# Patient Record
Sex: Male | Born: 1975
Health system: Southern US, Community
[De-identification: ages and names within clinical notes are randomized; demographics above are authoritative.]

## PROBLEM LIST (undated history)

## (undated) DIAGNOSIS — J45909 Unspecified asthma, uncomplicated: Secondary | ICD-10-CM

## (undated) DIAGNOSIS — Z9989 Dependence on other enabling machines and devices: Secondary | ICD-10-CM

## (undated) DIAGNOSIS — J189 Pneumonia, unspecified organism: Secondary | ICD-10-CM

## (undated) DIAGNOSIS — I509 Heart failure, unspecified: Secondary | ICD-10-CM

## (undated) DIAGNOSIS — G4733 Obstructive sleep apnea (adult) (pediatric): Secondary | ICD-10-CM

## (undated) DIAGNOSIS — E119 Type 2 diabetes mellitus without complications: Secondary | ICD-10-CM

## (undated) HISTORY — DX: Pneumonia, unspecified organism: J18.9

## (undated) HISTORY — DX: Heart failure, unspecified: I50.9

## (undated) HISTORY — DX: Dependence on other enabling machines and devices: Z99.89

## (undated) HISTORY — PX: I & D EXTREMITY: SHX5045

## (undated) HISTORY — DX: Obstructive sleep apnea (adult) (pediatric): G47.33

## (undated) HISTORY — DX: Type 2 diabetes mellitus without complications: E11.9

## (undated) HISTORY — PX: SKIN GRAFT: SHX250

---

## 2016-03-06 ENCOUNTER — Emergency Department (HOSPITAL_COMMUNITY)
Admission: EM | Admit: 2016-03-06 | Discharge: 2016-03-06 | Disposition: A | Discharge status: Home / Self Care | Payer: Self-pay | Attending: Emergency Medicine | Admitting: Emergency Medicine

## 2016-03-06 ENCOUNTER — Encounter (HOSPITAL_COMMUNITY): Payer: Self-pay

## 2016-03-06 ENCOUNTER — Emergency Department (HOSPITAL_COMMUNITY): Payer: Self-pay

## 2016-03-06 DIAGNOSIS — F17210 Nicotine dependence, cigarettes, uncomplicated: Secondary | ICD-10-CM | POA: Insufficient documentation

## 2016-03-06 DIAGNOSIS — Y92410 Unspecified street and highway as the place of occurrence of the external cause: Secondary | ICD-10-CM | POA: Insufficient documentation

## 2016-03-06 DIAGNOSIS — Y939 Activity, unspecified: Secondary | ICD-10-CM | POA: Insufficient documentation

## 2016-03-06 DIAGNOSIS — I493 Ventricular premature depolarization: Secondary | ICD-10-CM | POA: Insufficient documentation

## 2016-03-06 DIAGNOSIS — I712 Thoracic aortic aneurysm, without rupture: Secondary | ICD-10-CM | POA: Insufficient documentation

## 2016-03-06 DIAGNOSIS — J45909 Unspecified asthma, uncomplicated: Secondary | ICD-10-CM | POA: Insufficient documentation

## 2016-03-06 DIAGNOSIS — M25512 Pain in left shoulder: Secondary | ICD-10-CM | POA: Insufficient documentation

## 2016-03-06 DIAGNOSIS — Y999 Unspecified external cause status: Secondary | ICD-10-CM | POA: Insufficient documentation

## 2016-03-06 HISTORY — DX: Unspecified asthma, uncomplicated: J45.909

## 2016-03-06 LAB — I-STAT CHEM 8, ED
BUN: 12 mg/dL (ref 6–20)
CALCIUM ION: 1.13 mmol/L — AB (ref 1.15–1.40)
CREATININE: 0.5 mg/dL — AB (ref 0.61–1.24)
Chloride: 97 mmol/L — ABNORMAL LOW (ref 101–111)
GLUCOSE: 370 mg/dL — AB (ref 65–99)
HCT: 54 % — ABNORMAL HIGH (ref 39.0–52.0)
HEMOGLOBIN: 18.4 g/dL — AB (ref 13.0–17.0)
POTASSIUM: 4.3 mmol/L (ref 3.5–5.1)
Sodium: 137 mmol/L (ref 135–145)
TCO2: 31 mmol/L (ref 0–100)

## 2016-03-06 LAB — COMPREHENSIVE METABOLIC PANEL
ALT: 18 U/L (ref 17–63)
ANION GAP: 8 (ref 5–15)
AST: 20 U/L (ref 15–41)
Albumin: 3.7 g/dL (ref 3.5–5.0)
Alkaline Phosphatase: 103 U/L (ref 38–126)
BUN: 9 mg/dL (ref 6–20)
CHLORIDE: 101 mmol/L (ref 101–111)
CO2: 27 mmol/L (ref 22–32)
CREATININE: 0.59 mg/dL — AB (ref 0.61–1.24)
Calcium: 9.4 mg/dL (ref 8.9–10.3)
Glucose, Bld: 378 mg/dL — ABNORMAL HIGH (ref 65–99)
POTASSIUM: 4.4 mmol/L (ref 3.5–5.1)
SODIUM: 136 mmol/L (ref 135–145)
Total Bilirubin: 0.7 mg/dL (ref 0.3–1.2)
Total Protein: 6.2 g/dL — ABNORMAL LOW (ref 6.5–8.1)

## 2016-03-06 LAB — URINE MICROSCOPIC-ADD ON
Bacteria, UA: NONE SEEN
RBC / HPF: NONE SEEN RBC/hpf (ref 0–5)

## 2016-03-06 LAB — URINALYSIS, ROUTINE W REFLEX MICROSCOPIC
Bilirubin Urine: NEGATIVE
Hgb urine dipstick: NEGATIVE
KETONES UR: NEGATIVE mg/dL
LEUKOCYTES UA: NEGATIVE
NITRITE: NEGATIVE
PH: 6.5 (ref 5.0–8.0)
PROTEIN: 100 mg/dL — AB
Specific Gravity, Urine: 1.03 (ref 1.005–1.030)

## 2016-03-06 LAB — CBC
HCT: 52.7 % — ABNORMAL HIGH (ref 39.0–52.0)
HEMOGLOBIN: 17.7 g/dL — AB (ref 13.0–17.0)
MCH: 29.9 pg (ref 26.0–34.0)
MCHC: 33.6 g/dL (ref 30.0–36.0)
MCV: 89 fL (ref 78.0–100.0)
PLATELETS: 199 10*3/uL (ref 150–400)
RBC: 5.92 MIL/uL — AB (ref 4.22–5.81)
RDW: 13.3 % (ref 11.5–15.5)
WBC: 9.7 10*3/uL (ref 4.0–10.5)

## 2016-03-06 LAB — SAMPLE TO BLOOD BANK

## 2016-03-06 LAB — I-STAT TROPONIN, ED: Troponin i, poc: 0.01 ng/mL (ref 0.00–0.08)

## 2016-03-06 LAB — I-STAT CG4 LACTIC ACID, ED: Lactic Acid, Venous: 2.18 mmol/L (ref 0.5–1.9)

## 2016-03-06 LAB — ETHANOL: Alcohol, Ethyl (B): <5 mg/dL (ref <5)

## 2016-03-06 LAB — PROTIME-INR
INR: 0.87
PROTHROMBIN TIME: 11.8 s (ref 11.4–15.2)

## 2016-03-06 MED ORDER — IOPAMIDOL (ISOVUE-300) INJECTION 61%
75.0000 mL | Freq: Once | INTRAVENOUS | Status: AC | PRN
  Administered 2016-03-06: 75 mL via INTRAVENOUS

## 2016-03-06 MED ORDER — IOPAMIDOL (ISOVUE-300) INJECTION 61%
INTRAVENOUS | Status: AC
  Filled 2016-03-06: qty 75

## 2016-03-06 NOTE — ED Notes (Signed)
Towel collar removed by EDP.
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
## 2016-03-06 NOTE — ED Notes (Signed)
Spoke with MD about pt continuing to be in ventricular bigeminy. Pt to follow up with outpatient cardiology. No chest pain throughout visit.

## 2016-03-06 NOTE — ED Notes (Signed)
Pt discharged, comfortable with d/c instructions. Pt instructed multiple times the importance of following up with a PCP. Educated patient on how to arrange care with PCP. Pt verbalized understanding and escorted to the lobby with family member.

## 2016-06-23 ENCOUNTER — Emergency Department
Admission: EM | Admit: 2016-06-23 | Discharge: 2016-06-24 | Disposition: A | Discharge status: Home / Self Care | Payer: Self-pay | Attending: Emergency Medicine | Admitting: Emergency Medicine

## 2016-06-23 ENCOUNTER — Emergency Department: Payer: Self-pay

## 2016-06-23 ENCOUNTER — Encounter: Payer: Self-pay | Admitting: Emergency Medicine

## 2016-06-23 DIAGNOSIS — J189 Pneumonia, unspecified organism: Secondary | ICD-10-CM

## 2016-06-23 DIAGNOSIS — R0602 Shortness of breath: Secondary | ICD-10-CM | POA: Insufficient documentation

## 2016-06-23 DIAGNOSIS — J45909 Unspecified asthma, uncomplicated: Secondary | ICD-10-CM | POA: Insufficient documentation

## 2016-06-23 DIAGNOSIS — F17210 Nicotine dependence, cigarettes, uncomplicated: Secondary | ICD-10-CM | POA: Insufficient documentation

## 2016-06-23 HISTORY — DX: Pneumonia, unspecified organism: J18.9

## 2016-06-23 LAB — CBC WITH DIFFERENTIAL/PLATELET
Basophils Absolute: 0.1 10*3/uL (ref 0–0.1)
Basophils Relative: 1 %
EOS ABS: 0.1 10*3/uL (ref 0–0.7)
Eosinophils Relative: 1 %
HCT: 50.9 % (ref 40.0–52.0)
HEMOGLOBIN: 16.8 g/dL (ref 13.0–18.0)
LYMPHS ABS: 1.2 10*3/uL (ref 1.0–3.6)
Lymphocytes Relative: 11 %
MCH: 29.3 pg (ref 26.0–34.0)
MCHC: 33.1 g/dL (ref 32.0–36.0)
MCV: 88.5 fL (ref 80.0–100.0)
MONO ABS: 0.8 10*3/uL (ref 0.2–1.0)
MONOS PCT: 7 %
NEUTROS PCT: 80 %
Neutro Abs: 8.7 10*3/uL — ABNORMAL HIGH (ref 1.4–6.5)
Platelets: 213 10*3/uL (ref 150–440)
RBC: 5.76 MIL/uL (ref 4.40–5.90)
RDW: 14.5 % (ref 11.5–14.5)
WBC: 11 10*3/uL — ABNORMAL HIGH (ref 3.8–10.6)

## 2016-06-23 LAB — BASIC METABOLIC PANEL
Anion gap: 5 (ref 5–15)
BUN: 14 mg/dL (ref 6–20)
CALCIUM: 8.5 mg/dL — AB (ref 8.9–10.3)
CHLORIDE: 100 mmol/L — AB (ref 101–111)
CO2: 30 mmol/L (ref 22–32)
CREATININE: 0.69 mg/dL (ref 0.61–1.24)
GFR calc Af Amer: >60 mL/min (ref >60)
GFR calc non Af Amer: >60 mL/min (ref >60)
Glucose, Bld: 139 mg/dL — ABNORMAL HIGH (ref 65–99)
Potassium: 3.8 mmol/L (ref 3.5–5.1)
SODIUM: 135 mmol/L (ref 135–145)

## 2016-06-23 LAB — TROPONIN I
Troponin I: 0.03 ng/mL (ref <0.03)
Troponin I: 0.03 ng/mL (ref <0.03)

## 2016-06-23 LAB — BRAIN NATRIURETIC PEPTIDE: B Natriuretic Peptide: 522 pg/mL — ABNORMAL HIGH (ref 0.0–100.0)

## 2016-06-23 MED ORDER — ALBUTEROL SULFATE (2.5 MG/3ML) 0.083% IN NEBU
5.0000 mg | INHALATION_SOLUTION | Freq: Once | RESPIRATORY_TRACT | Status: AC
  Administered 2016-06-23: 5 mg via RESPIRATORY_TRACT

## 2016-06-23 MED ORDER — FUROSEMIDE 10 MG/ML IJ SOLN
40.0000 mg | Freq: Once | INTRAMUSCULAR | Status: AC
  Administered 2016-06-23: 40 mg via INTRAVENOUS
  Filled 2016-06-23: qty 4

## 2016-06-23 MED ORDER — FUROSEMIDE 40 MG PO TABS: 40.0000 mg | ORAL_TABLET | Freq: Every day | ORAL | 0 refills | Status: DC

## 2016-06-23 MED ORDER — ALBUTEROL SULFATE (2.5 MG/3ML) 0.083% IN NEBU
INHALATION_SOLUTION | RESPIRATORY_TRACT | Status: AC
  Administered 2016-06-23: 5 mg via RESPIRATORY_TRACT
  Filled 2016-06-23: qty 6

## 2016-06-23 NOTE — Discharge Instructions (Signed)
Please seek medical attention for any high fevers, chest pain, shortness of breath, change in behavior, persistent vomiting, bloody stool or any other new or concerning symptoms.  

## 2016-06-23 NOTE — ED Notes (Addendum)
Reviewed pt's triage and CXR; charge nurse notified of results and bed requested; pt taken to room 3 by pt relations rep & report called to care nurse, Revonda Standard, RN; nurse notified no labs were drawn

## 2016-06-23 NOTE — ED Provider Notes (Signed)
[facility] Emergency Department Provider Note   ____________________________________________   I have reviewed the triage vital signs and the nursing notes.   HISTORY  Chief Complaint Cough   History limited by: Not Limited   HPI Marc Wright is a 41 y.o. male who presents to the emergency department today because of concern for shortness of breath and cough. The patient states that the shortness breath has been getting worse over the past few days. He noticed that is worse when he sitting straight up. This has been accompanied by cough. He has had some discomfort in his chest with very deep breaths. Patient additionally has noticed some swelling in his lower legs. This has been getting worse over the past few months. Patient denies any known history of heart disease. Patient denies any fevers. States he has not a couple people about pneumonias recently.    Past Medical History:  Diagnosis Date  . Asthma     There are no active problems to display for this patient.   History reviewed. No pertinent surgical history.  Prior to Admission medications   Not on File    Allergies Patient has no known allergies.  No family history on file.  Social History Social History  Substance Use Topics  . Smoking status: Current Every Day Smoker    Packs/day: 0.75    Types: Cigarettes  . Smokeless tobacco: Never Used  . Alcohol use Yes     Comment: occasional     Review of Systems  Constitutional: Negative for fever. Cardiovascular: Negative for chest pain. Respiratory: Positive for shortness of breath. Gastrointestinal: Negative for abdominal pain, vomiting and diarrhea. Genitourinary: Negative for dysuria. Musculoskeletal: Negative for back pain. Positive for leg swelling. Skin: Negative for rash. Neurological: Negative for headaches, focal weakness or numbness.  10-point ROS otherwise  negative.  ____________________________________________   PHYSICAL EXAM:  VITAL SIGNS: ED Triage Vitals  Enc Vitals Group     BP 06/23/16 1706 (!) 184/101     Pulse Rate 06/23/16 1706 60     Resp 06/23/16 1706 20     Temp 06/23/16 1706 99 F (37.2 C)     Temp Source 06/23/16 1706 Oral     SpO2 06/23/16 1706 90 %     Weight 06/23/16 1651 (!) 437 lb (198.2 kg)     Height 06/23/16 1651 6\' 3"  (1.905 m)     Head Circumference --      Peak Flow --      Pain Score 06/23/16 1652 6     Pain Loc --      Pain Edu? --      Excl. in GC? --      Constitutional: Alert and oriented. Well appearing and in no distress. Eyes: Conjunctivae are normal. Normal extraocular movements. ENT   Head: Normocephalic and atraumatic.   Nose: No congestion/rhinnorhea.   Mouth/Throat: Mucous membranes are moist.   Neck: No stridor. Hematological/Lymphatic/Immunilogical: No cervical lymphadenopathy. Cardiovascular: Normal rate, regular rhythm.  No murmurs, rubs, or gallops. Respiratory: Normal respiratory effort without tachypnea nor retractions. Slight diffuse expiratory wheeze. Gastrointestinal: Soft and non tender. No rebound. No guarding.  Genitourinary: Deferred Musculoskeletal: Normal range of motion in all extremities. No lower extremity edema. Neurologic:  Normal speech and language. No gross focal neurologic deficits are appreciated.  Skin:  Skin is warm, dry and intact. No rash noted. Psychiatric: Mood and affect are normal. Speech and behavior are normal. Patient exhibits appropriate insight and judgment.  ____________________________________________  LABS (pertinent positives/negatives)  Labs Reviewed  CBC WITH DIFFERENTIAL/PLATELET - Abnormal; Notable for the following:       Result Value   WBC 11.0 (*)    Neutro Abs 8.7 (*)    All other components within normal limits  BASIC METABOLIC PANEL - Abnormal; Notable for the following:    Chloride 100 (*)    Glucose, Bld  139 (*)    Calcium 8.5 (*)    All other components within normal limits  BRAIN NATRIURETIC PEPTIDE - Abnormal; Notable for the following:    B Natriuretic Peptide 522.0 (*)    All other components within normal limits  TROPONIN I - Abnormal; Notable for the following:    Troponin I 0.03 (*)    All other components within normal limits  TROPONIN I - Abnormal; Notable for the following:    Troponin I 0.03 (*)    All other components within normal limits     ____________________________________________   EKG  I, Phineas Semen, attending physician, personally viewed and interpreted this EKG  EKG Time: 1719 Rate: 117 Rhythm: sinus tachycardia with PVC Axis: right axis deviation Intervals: qtc 465 QRS: incomplete LBBB ST changes: no st elevation Impression: abnormal ekg   ____________________________________________    RADIOLOGY  CXR IMPRESSION: Congestive heart failure.   ____________________________________________   PROCEDURES  Procedures  ____________________________________________   INITIAL IMPRESSION / ASSESSMENT AND PLAN / ED COURSE  Pertinent labs & imaging results that were available during my care of the patient were reviewed by me and considered in my medical decision making (see chart for details).  Patient presented to the emergency department today because of shortness of breath. Exam did show some pulmonary edema and BNP was mildly elevated. The patient might have had some aspect of the CHF for quite some time given month-long history of worsening leg swelling. This point don't think any new acute pathology given negative troponins. Patient was given dose of IV Lasix here with good urinary output. Patient a few more days worth of Lasix. Will give patient cardiology follow-up. Explained to patient importance of cardiology follow-up.  ____________________________________________   FINAL CLINICAL IMPRESSION(S) / ED DIAGNOSES  Final diagnoses:   Shortness of breath     Note: This dictation was prepared with Dragon dictation. Any transcriptional errors that result from this process are unintentional     Phineas Semen, MD 06/24/16 4098

## 2016-06-23 NOTE — ED Notes (Signed)
MD Goodman at bedside at this time.  

## 2016-06-23 NOTE — ED Triage Notes (Signed)
Cough and sob x 2 days.

## 2016-06-24 NOTE — ED Notes (Signed)

## 2016-06-24 NOTE — ED Notes (Signed)
Pt. Reports feeling relief and "somewhat better" after lasix.

## 2016-06-25 ENCOUNTER — Emergency Department: Payer: Self-pay

## 2016-06-25 ENCOUNTER — Inpatient Hospital Stay
Admission: EM | Admit: 2016-06-25 | Discharge: 2016-07-01 | DRG: 291 | Disposition: A | Discharge status: Home / Self Care | Payer: Self-pay | Attending: Internal Medicine | Admitting: Internal Medicine

## 2016-06-25 DIAGNOSIS — Z6841 Body Mass Index (BMI) 40.0 and over, adult: Secondary | ICD-10-CM

## 2016-06-25 DIAGNOSIS — J440 Chronic obstructive pulmonary disease with acute lower respiratory infection: Secondary | ICD-10-CM | POA: Diagnosis present

## 2016-06-25 DIAGNOSIS — E662 Morbid (severe) obesity with alveolar hypoventilation: Secondary | ICD-10-CM | POA: Diagnosis present

## 2016-06-25 DIAGNOSIS — I509 Heart failure, unspecified: Secondary | ICD-10-CM

## 2016-06-25 DIAGNOSIS — F17210 Nicotine dependence, cigarettes, uncomplicated: Secondary | ICD-10-CM | POA: Diagnosis present

## 2016-06-25 DIAGNOSIS — J189 Pneumonia, unspecified organism: Secondary | ICD-10-CM

## 2016-06-25 DIAGNOSIS — E1165 Type 2 diabetes mellitus with hyperglycemia: Secondary | ICD-10-CM | POA: Diagnosis present

## 2016-06-25 DIAGNOSIS — Z23 Encounter for immunization: Secondary | ICD-10-CM

## 2016-06-25 DIAGNOSIS — I248 Other forms of acute ischemic heart disease: Secondary | ICD-10-CM | POA: Diagnosis present

## 2016-06-25 DIAGNOSIS — J9601 Acute respiratory failure with hypoxia: Secondary | ICD-10-CM | POA: Diagnosis present

## 2016-06-25 DIAGNOSIS — I420 Dilated cardiomyopathy: Secondary | ICD-10-CM | POA: Diagnosis present

## 2016-06-25 DIAGNOSIS — I110 Hypertensive heart disease with heart failure: Principal | ICD-10-CM | POA: Diagnosis present

## 2016-06-25 DIAGNOSIS — J180 Bronchopneumonia, unspecified organism: Secondary | ICD-10-CM | POA: Diagnosis present

## 2016-06-25 DIAGNOSIS — I5043 Acute on chronic combined systolic (congestive) and diastolic (congestive) heart failure: Secondary | ICD-10-CM | POA: Diagnosis present

## 2016-06-25 DIAGNOSIS — Z8249 Family history of ischemic heart disease and other diseases of the circulatory system: Secondary | ICD-10-CM

## 2016-06-25 DIAGNOSIS — J441 Chronic obstructive pulmonary disease with (acute) exacerbation: Secondary | ICD-10-CM | POA: Diagnosis present

## 2016-06-25 LAB — CBC
HEMATOCRIT: 51.7 % (ref 40.0–52.0)
Hemoglobin: 17.3 g/dL (ref 13.0–18.0)
MCH: 29 pg (ref 26.0–34.0)
MCHC: 33.5 g/dL (ref 32.0–36.0)
MCV: 86.5 fL (ref 80.0–100.0)
PLATELETS: 215 10*3/uL (ref 150–440)
RBC: 5.97 MIL/uL — ABNORMAL HIGH (ref 4.40–5.90)
RDW: 14.6 % — AB (ref 11.5–14.5)
WBC: 9.2 10*3/uL (ref 3.8–10.6)

## 2016-06-25 LAB — LIPID PANEL
CHOLESTEROL: 118 mg/dL (ref 0–200)
HDL: 28 mg/dL — ABNORMAL LOW (ref >40)
LDL CALC: 68 mg/dL (ref 0–99)
Total CHOL/HDL Ratio: 4.2 RATIO
Triglycerides: 112 mg/dL (ref <150)
VLDL: 22 mg/dL (ref 0–40)

## 2016-06-25 LAB — TROPONIN I
Troponin I: 0.04 ng/mL (ref <0.03)
Troponin I: 0.06 ng/mL (ref <0.03)
Troponin I: 0.03 ng/mL (ref <0.03)

## 2016-06-25 LAB — BASIC METABOLIC PANEL
Anion gap: 6 (ref 5–15)
BUN: 11 mg/dL (ref 6–20)
CO2: 32 mmol/L (ref 22–32)
CREATININE: 0.8 mg/dL (ref 0.61–1.24)
Calcium: 8.9 mg/dL (ref 8.9–10.3)
Chloride: 99 mmol/L — ABNORMAL LOW (ref 101–111)
GFR calc Af Amer: >60 mL/min (ref >60)
GLUCOSE: 124 mg/dL — AB (ref 65–99)
Potassium: 4 mmol/L (ref 3.5–5.1)
SODIUM: 137 mmol/L (ref 135–145)

## 2016-06-25 LAB — RAPID INFLUENZA A&B ANTIGENS
Influenza A (ARMC): NEGATIVE
Influenza B (ARMC): NEGATIVE

## 2016-06-25 LAB — BRAIN NATRIURETIC PEPTIDE: B NATRIURETIC PEPTIDE 5: 654 pg/mL — AB (ref 0.0–100.0)

## 2016-06-25 MED ORDER — ALBUTEROL SULFATE (2.5 MG/3ML) 0.083% IN NEBU
10.0000 mg | INHALATION_SOLUTION | Freq: Once | RESPIRATORY_TRACT | Status: AC
  Administered 2016-06-25: 10 mg via RESPIRATORY_TRACT
  Filled 2016-06-25: qty 12

## 2016-06-25 MED ORDER — IPRATROPIUM-ALBUTEROL 0.5-2.5 (3) MG/3ML IN SOLN: 3.0000 mL | RESPIRATORY_TRACT | Status: DC

## 2016-06-25 MED ORDER — IPRATROPIUM-ALBUTEROL 0.5-2.5 (3) MG/3ML IN SOLN
3.0000 mL | Freq: Four times a day (QID) | RESPIRATORY_TRACT | Status: DC
  Administered 2016-06-26 – 2016-07-01 (×18): 3 mL via RESPIRATORY_TRACT
  Filled 2016-06-25 (×17): qty 3

## 2016-06-25 MED ORDER — CEFTRIAXONE SODIUM-DEXTROSE 1-3.74 GM-% IV SOLR
1.0000 g | INTRAVENOUS | Status: DC
  Administered 2016-06-26 – 2016-06-29 (×4): 1 g via INTRAVENOUS
  Filled 2016-06-25 (×5): qty 50

## 2016-06-25 MED ORDER — IPRATROPIUM-ALBUTEROL 0.5-2.5 (3) MG/3ML IN SOLN
3.0000 mL | Freq: Once | RESPIRATORY_TRACT | Status: AC
  Administered 2016-06-25: 3 mL via RESPIRATORY_TRACT
  Filled 2016-06-25: qty 3

## 2016-06-25 MED ORDER — DEXTROSE 5 % IV SOLN: 1.0000 g | INTRAVENOUS | Status: DC

## 2016-06-25 MED ORDER — METHYLPREDNISOLONE SODIUM SUCC 125 MG IJ SOLR
60.0000 mg | Freq: Four times a day (QID) | INTRAMUSCULAR | Status: DC
  Administered 2016-06-25 – 2016-06-26 (×3): 60 mg via INTRAVENOUS
  Filled 2016-06-25 (×3): qty 2

## 2016-06-25 MED ORDER — INFLUENZA VAC SPLIT QUAD 0.5 ML IM SUSY
0.5000 mL | PREFILLED_SYRINGE | INTRAMUSCULAR | Status: AC
  Administered 2016-06-28: 0.5 mL via INTRAMUSCULAR
  Filled 2016-06-25: qty 0.5

## 2016-06-25 MED ORDER — CEFTRIAXONE SODIUM 1 G IJ SOLR: 1.0000 g | Freq: Once | INTRAMUSCULAR | Status: DC

## 2016-06-25 MED ORDER — PNEUMOCOCCAL VAC POLYVALENT 25 MCG/0.5ML IJ INJ
0.5000 mL | INJECTION | INTRAMUSCULAR | Status: AC
Start: 2016-06-26 — End: 2016-06-29
  Administered 2016-06-29: 0.5 mL via INTRAMUSCULAR
  Filled 2016-06-25: qty 0.5

## 2016-06-25 MED ORDER — HEPARIN SODIUM (PORCINE) 5000 UNIT/ML IJ SOLN
5000.0000 [IU] | Freq: Three times a day (TID) | INTRAMUSCULAR | Status: DC
  Administered 2016-06-25 – 2016-06-26 (×3): 5000 [IU] via SUBCUTANEOUS
  Filled 2016-06-25 (×3): qty 1

## 2016-06-25 MED ORDER — FUROSEMIDE 10 MG/ML IJ SOLN
40.0000 mg | Freq: Once | INTRAMUSCULAR | Status: AC
  Administered 2016-06-25: 40 mg via INTRAVENOUS
  Filled 2016-06-25: qty 4

## 2016-06-25 MED ORDER — DEXTROSE 5 % IV SOLN
500.0000 mg | Freq: Once | INTRAVENOUS | Status: AC
  Administered 2016-06-25: 500 mg via INTRAVENOUS
  Filled 2016-06-25: qty 500

## 2016-06-25 MED ORDER — FUROSEMIDE 10 MG/ML IJ SOLN
20.0000 mg | Freq: Three times a day (TID) | INTRAMUSCULAR | Status: DC
  Administered 2016-06-25 – 2016-07-01 (×18): 20 mg via INTRAVENOUS
  Filled 2016-06-25 (×18): qty 2

## 2016-06-25 MED ORDER — METHYLPREDNISOLONE SODIUM SUCC 125 MG IJ SOLR
125.0000 mg | Freq: Once | INTRAMUSCULAR | Status: AC
  Administered 2016-06-25: 125 mg via INTRAVENOUS
  Filled 2016-06-25: qty 2

## 2016-06-25 MED ORDER — METOPROLOL TARTRATE 25 MG PO TABS
25.0000 mg | ORAL_TABLET | Freq: Two times a day (BID) | ORAL | Status: DC
Start: 2016-06-25 — End: 2016-06-27
  Administered 2016-06-26 – 2016-06-27 (×3): 25 mg via ORAL
  Filled 2016-06-25 (×3): qty 1

## 2016-06-25 MED ORDER — SODIUM CHLORIDE 0.9% FLUSH
3.0000 mL | Freq: Two times a day (BID) | INTRAVENOUS | Status: DC
  Administered 2016-06-25 – 2016-07-01 (×12): 3 mL via INTRAVENOUS

## 2016-06-25 MED ORDER — IOPAMIDOL (ISOVUE-370) INJECTION 76%
100.0000 mL | Freq: Once | INTRAVENOUS | Status: AC | PRN
  Administered 2016-06-25: 100 mL via INTRAVENOUS

## 2016-06-25 MED ORDER — DEXTROSE 5 % IV SOLN
250.0000 mg | INTRAVENOUS | Status: DC
  Filled 2016-06-25: qty 250

## 2016-06-25 MED ORDER — CEFTRIAXONE SODIUM-DEXTROSE 1-3.74 GM-% IV SOLR
1.0000 g | Freq: Once | INTRAVENOUS | Status: AC
  Administered 2016-06-25: 1 g via INTRAVENOUS
  Filled 2016-06-25: qty 50

## 2016-06-25 MED ORDER — IPRATROPIUM-ALBUTEROL 0.5-2.5 (3) MG/3ML IN SOLN
3.0000 mL | Freq: Four times a day (QID) | RESPIRATORY_TRACT | Status: DC
  Administered 2016-06-25: 3 mL via RESPIRATORY_TRACT
  Filled 2016-06-25 (×2): qty 3

## 2016-06-25 NOTE — ED Triage Notes (Signed)
Pt c/o cough and SOB X 2 days. Seen in ER and dx with pulmonary edema. Pt sent from [facility]. Pt increased WOB

## 2016-06-25 NOTE — ED Notes (Signed)
1 set of blood cultures sent to lab and lactic acid.

## 2016-06-25 NOTE — ED Notes (Signed)
Pt was seen by dr Juliann Pares and sent to er for eval of sob today.  Pt seen in er 2 days ago with similar sx.  States i'm not any better after 2 days of lasix.  cig smoker.  Pt denies chest pain.  States I just cant breathe well.  Pt on 2 liters oxygen.  Sinus tach on monitor at 128.  Iv in place.  Skin warm and dry.

## 2016-06-25 NOTE — ED Notes (Signed)
Lab called with troponin of 0.04  Dr Don Perking aware.

## 2016-06-25 NOTE — ED Notes (Signed)
Report called to floor nurse rn  

## 2016-06-25 NOTE — ED Notes (Signed)
Patient transported to X-ray 

## 2016-06-25 NOTE — ED Notes (Signed)
Pt more comfortable in bed, pt hooked back and admitted into system, rn notified of pt o2 only at 90% on 2 liters.

## 2016-06-25 NOTE — ED Notes (Signed)
Pt voided 400cc urine again.  Sinus tach on monitor.   Family with pt.

## 2016-06-25 NOTE — ED Notes (Signed)
Pt alert.  No chest pain  Pt rec breathing treatment.  Iv meds infusing.

## 2016-06-25 NOTE — H&P (Signed)
[employer] [facility] [place] [facility]   PATIENT NAME: Marc Wright    MR#:  [id number]  DATE OF BIRTH:  1975/10/24  DATE OF ADMISSION:  06/25/2016  PRIMARY CARE PHYSICIAN: No PCP Per Patient   REQUESTING/REFERRING PHYSICIAN: Sherrie Mustache  CHIEF COMPLAINT:   Chief Complaint  Patient presents with  . Shortness of Breath    HISTORY OF PRESENT ILLNESS: Marc Wright  is a 41 y.o. male with a known history of No medical issues, does not follow with Doctor. Chronic smoker. Came to emergency room to 3 days ago as for last few days he has worsening swelling on his legs and he was feeling short of breath. He was given some nebulizer treatments and injection Lasix with good response with diuresis so he was sent home with oral Lasix tablets from ER and advised to follow with cardiologist in 2-3 days. Response to oral Lasix also and he lost almost 10 pounds in last 2-3 days. He went to see cardiologist today in the office and he was found to be hypoxic with tachypneic and tachycardia so cardiology suggested to him go to emergency room and he need to get admitted. On CT scan he was found to have bronchopneumonia and pulmonary edema and he was hypoxic with tachypnea so given to hospitalist team for further management.  PAST MEDICAL HISTORY:   Past Medical History:  Diagnosis Date  . Asthma     PAST SURGICAL HISTORY: History reviewed. No pertinent surgical history.  SOCIAL HISTORY:  Social History  Substance Use Topics  . Smoking status: Current Every Day Smoker    Packs/day: 0.75    Types: Cigarettes  . Smokeless tobacco: Never Used  . Alcohol use Yes     Comment: occasional     FAMILY HISTORY:  Family History  Problem Relation Age of Onset  . Congestive Heart Failure Mother   . Hypertension Mother     DRUG ALLERGIES: No Known Allergies  REVIEW OF SYSTEMS:   CONSTITUTIONAL: No fever, fatigue or weakness.  EYES: No blurred or double vision.  EARS, NOSE, AND  THROAT: No tinnitus or ear pain.  RESPIRATORY: No cough, Positive for shortness of breath, no wheezing or hemoptysis.  CARDIOVASCULAR: No chest pain, positive for orthopnea, edema.  GASTROINTESTINAL: No nausea, vomiting, diarrhea or abdominal pain.  GENITOURINARY: No dysuria, hematuria.  ENDOCRINE: No polyuria, nocturia,  HEMATOLOGY: No anemia, easy bruising or bleeding SKIN: No rash or lesion. MUSCULOSKELETAL: No joint pain or arthritis.   NEUROLOGIC: No tingling, numbness, weakness.  PSYCHIATRY: No anxiety or depression.   MEDICATIONS AT HOME:  Prior to Admission medications   Medication Sig Start Date End Date Taking? Authorizing Provider  B Complex-C (B-COMPLEX WITH VITAMIN C) tablet Take 1 tablet by mouth daily.   Yes Historical Provider, MD  furosemide (LASIX) 40 MG tablet Take 1 tablet (40 mg total) by mouth daily. 06/23/16 06/23/17 Yes Phineas Semen, MD  multivitamin (ONE-A-DAY MEN'S) TABS tablet Take 1 tablet by mouth daily.   Yes Historical Provider, MD  vitamin C (ASCORBIC ACID) 500 MG tablet Take 500 mg by mouth daily.   Yes Historical Provider, MD      PHYSICAL EXAMINATION:   VITAL SIGNS: Blood pressure (!) 144/98, pulse (!) 123, temperature 100 F (37.8 C), temperature source Oral, resp. rate 16, height 6\' 3"  (1.905 m), weight (!) 198.2 kg (437 lb), SpO2 95 %.  GENERAL:  41 y.o.-year-old Morbidly obese patient lying in the bed with no acute distress.  EYES: Pupils equal, round, reactive to light and accommodation. No scleral icterus. Extraocular muscles intact.  HEENT: Head atraumatic, normocephalic. Oropharynx and nasopharynx clear.  NECK:  Supple, no jugular venous distention. No thyroid enlargement, no tenderness.  LUNGS: Normal breath sounds bilaterally, no wheezing, bilateral crepitation. Positive use of accessory muscles of respiration.  CARDIOVASCULAR: S1, S2 normal. No murmurs, rubs, or gallops.  ABDOMEN: Soft, nontender, nondistended. Bowel sounds present. No  organomegaly or mass.  EXTREMITIES: Bilateral severe pedal edema, no cyanosis, or clubbing.  NEUROLOGIC: Cranial nerves II through XII are intact. Muscle strength 5/5 in all extremities. Sensation intact. Gait not checked.  PSYCHIATRIC: The patient is alert and oriented x 3.  SKIN: No obvious rash, lesion, or ulcer.   LABORATORY PANEL:   CBC  Recent Labs Lab 06/23/16 1956 06/25/16 1544  WBC 11.0* 9.2  HGB 16.8 17.3  HCT 50.9 51.7  PLT 213 215  MCV 88.5 86.5  MCH 29.3 29.0  MCHC 33.1 33.5  RDW 14.5 14.6*  LYMPHSABS 1.2  --   MONOABS 0.8  --   EOSABS 0.1  --   BASOSABS 0.1  --    ------------------------------------------------------------------------------------------------------------------  Chemistries   Recent Labs Lab 06/23/16 1956 06/25/16 1544  NA 135 137  K 3.8 4.0  CL 100* 99*  CO2 30 32  GLUCOSE 139* 124*  BUN 14 11  CREATININE 0.69 0.80  CALCIUM 8.5* 8.9   ------------------------------------------------------------------------------------------------------------------ estimated creatinine clearance is 221.2 mL/min (by C-G formula based on SCr of 0.8 mg/dL). ------------------------------------------------------------------------------------------------------------------ No results for input(s): TSH, T4TOTAL, T3FREE, THYROIDAB in the last 72 hours.  Invalid input(s): FREET3   Coagulation profile No results for input(s): INR, PROTIME in the last 168 hours. ------------------------------------------------------------------------------------------------------------------- No results for input(s): DDIMER in the last 72 hours. -------------------------------------------------------------------------------------------------------------------  Cardiac Enzymes  Recent Labs Lab 06/23/16 1956 06/23/16 2302 06/25/16 1544  TROPONINI 0.03* 0.03* 0.04*    ------------------------------------------------------------------------------------------------------------------ Invalid input(s): POCBNP  ---------------------------------------------------------------------------------------------------------------  Urinalysis    Component Value Date/Time   COLORURINE YELLOW 03/06/2016 1302   APPEARANCEUR CLEAR 03/06/2016 1302   LABSPEC 1.030 03/06/2016 1302   PHURINE 6.5 03/06/2016 1302   GLUCOSEU >1000 (A) 03/06/2016 1302   HGBUR NEGATIVE 03/06/2016 1302   BILIRUBINUR NEGATIVE 03/06/2016 1302   KETONESUR NEGATIVE 03/06/2016 1302   PROTEINUR 100 (A) 03/06/2016 1302   NITRITE NEGATIVE 03/06/2016 1302   LEUKOCYTESUR NEGATIVE 03/06/2016 1302     RADIOLOGY: Dg Chest 2 View  Result Date: 06/25/2016 CLINICAL DATA:  Shortness of breath, chest congestion cough, history of asthma. Current smoker. EXAM: CHEST  2 VIEW COMPARISON:  Chest x-ray of June 23, 2014 FINDINGS: The lungs are well-expanded. The interstitial markings remain increased bilaterally but have improved slightly since yesterday's study. The cardiac silhouette remains enlarged. The pulmonary vascularity is less engorged today. The trachea is midline. There is no pleural effusion. IMPRESSION: Mild interval improvement in the appearance of the pulmonary interstitium consistent with resolving CHF superimposed upon underlying reactive airway disease. Electronically Signed   By: David  [place] M.D.   On: 06/25/2016 16:07   Dg Chest 2 View  Result Date: 06/23/2016 CLINICAL DATA:  Shortness breath and cough for 2 days. EXAM: CHEST  2 VIEW COMPARISON:  CT chest March 06, 2016 FINDINGS: The mediastinal contour is normal. The heart size is enlarged. Bilateral interstitial edema is identified. There is no focal pneumonia or pleural effusion. The visualized skeletal structures are unremarkable. IMPRESSION: Congestive heart failure. Electronically Signed   By: Sherian Rein M.D.   On:  06/23/2016 18:49    Ct Angio Chest Pe W And/or Wo Contrast  Result Date: 06/25/2016 CLINICAL DATA:  Cough and shortness of breath over the last 2 days. Pulmonary edema. EXAM: CT ANGIOGRAPHY CHEST WITH CONTRAST TECHNIQUE: Multidetector CT imaging of the chest was performed using the standard protocol during bolus administration of intravenous contrast. Multiplanar CT image reconstructions and MIPs were obtained to evaluate the vascular anatomy. CONTRAST:  100 cc Isovue 370 COMPARISON:  Radiography same day.  CT 03/06/2016. FINDINGS: Cardiovascular: Pulmonary arterial opacification is good. There are no pulmonary emboli. No aortic atherosclerosis is seen. Coronary artery calcification is noted in the left system, premature for age. No pericardial fluid. Mediastinum/Nodes: Mildly prominent hilar and mediastinal nodes, probably reactive. Lungs/Pleura: Interstitial prominence that could go along with edema. There are small areas of airspace filling in both lungs that are most consistent with widespread bronchopneumonia. These could be areas of airspace filling secondary to edema. There are small effusions layering dependently, right larger than left. Upper Abdomen: Normal Musculoskeletal: No significant finding. Review of the MIP images confirms the above findings. IMPRESSION: No pulmonary emboli. Interstitial edema pattern. Small effusions layering dependently right larger than left. Patchy areas of airspace filling bilaterally most consistent with bronchopneumonia. The differential diagnosis would be early alveolar edema, but pneumonia is favored. Electronically Signed   By: Paulina Fusi M.D.   On: 06/25/2016 16:59    EKG: Orders placed or performed during the hospital encounter of 06/25/16  . EKG 12-Lead  . EKG 12-Lead  . ED EKG within 10 minutes  . ED EKG within 10 minutes  Sinus tachycardia.  IMPRESSION AND PLAN:  * Acute hypoxic respiratory failure   Acute COPD exacerbation   Acute CHF exacerbation    Community-acquired pneumonia    IV Lasix, telemetry monitoring, serial troponin monitoring, echocardiogram, cardiology consult, intake and output measurement, fluid restriction.   IV steroid, nebulized bronchodilators.   IV ceftriaxone and azithromycin. Blood cultures are sent.   Continue supplemental oxygen, try to taper.    Patient does not have any insurance and he was not following with any doctor. He may need social work will case manager consult to guide him to charity clinic to continue his medication management.  * Hypertension   We'll give him metoprolol for now.  * Check Hba1c and lipid.  * Active smoking   Counseled to quit smoking for 4 minutes patient agreed he does not want to have nicotine patch. FEN spent 40 minutes in counseling.  All the records are reviewed and case discussed with ED provider. Management plans discussed with the patient, family and they are in agreement.  CODE STATUS: Full code Code Status History    This patient does not have a recorded code status. Please follow your organizational policy for patients in this situation.     Patient was present in the room during my visit.  TOTAL TIME TAKING CARE OF THIS PATIENT: 50 minutes.    Altamese Dilling M.D on 06/25/2016   Between 7am to 6pm - Pager - [phone]  After 6pm go to [email or web address] - Social research officer, government  [facility]  Office  [phone]  CC: Primary care physician; No PCP Per Patient   Note: This dictation was prepared with Dragon dictation along with smaller phrase technology. Any transcriptional errors that result from this process are unintentional.

## 2016-06-25 NOTE — ED Notes (Signed)
Meds given.   No chest pain  Pt alert.

## 2016-06-25 NOTE — ED Notes (Signed)
Pt placed on 2L [place]

## 2016-06-25 NOTE — ED Notes (Signed)
Pt voided at 500 cc urine in urinal.

## 2016-06-25 NOTE — ED Notes (Signed)
Pt voided another1000cc urine.  Pt breathing easier and feeling better.  Sinus tach on monitor at 119.  Pt alert.  Pt watching tv.

## 2016-06-25 NOTE — ED Provider Notes (Signed)
[facility] Emergency Department Provider Note  ____________________________________________  Time seen: Approximately 4:34 PM  I have reviewed the triage vital signs and the nursing notes.   HISTORY  Chief Complaint Shortness of Breath   HPI Marc Wright is a 41 y.o. male a history of obesity and smoking who presents from cardiology clinic for shortness of breath and hypoxia. Patient was seen here 2 days ago with a workup concerning for mild CHF. He was started on Lasix and went back to see cardiology today for establishment of care. He was found to be hypoxic on room air and tachycardic and was sent to the emergency room for further evaluation. His family history of heart failure in his mother. Patient has not seen a doctor many years. He reports that he stopped smoking 3 days ago. He denies personal or family history of blood clots, recent travel or immobilization. He does endorse bilateral leg pain and swelling, no hemoptysis or hormones. Patient reports a cough productive of green sputum x 3 days, orthopneahas been sleeping in a chair. He reports that he lost 14 pounds and has had decrease in bilateral leg swelling since being started on Lasix 2 days ago however continues to have the orthopnea and severe shortness of breath. No fevers or chills at home. Patient reports that his chest hurts when he takes a deep breath but if he does and he does not hurt. The pain is located in the center of his chest.  Past Medical History:  Diagnosis Date  . Asthma     There are no active problems to display for this patient.   History reviewed. No pertinent surgical history.  Prior to Admission medications   Medication Sig Start Date End Date Taking? Authorizing Provider  furosemide (LASIX) 40 MG tablet Take 1 tablet (40 mg total) by mouth daily. 06/23/16 06/23/17  Phineas Semen, MD    Allergies Patient has no known allergies.  No family history on file.  Social  History Social History  Substance Use Topics  . Smoking status: Current Every Day Smoker    Packs/day: 0.75    Types: Cigarettes  . Smokeless tobacco: Never Used  . Alcohol use Yes     Comment: occasional     Review of Systems  Constitutional: Negative for fever. Eyes: Negative for visual changes. ENT: Negative for sore throat. Neck: No neck pain  Cardiovascular: + chest pain and orthopnea Respiratory: + shortness of breath, wheezing, cough Gastrointestinal: Negative for abdominal pain, vomiting or diarrhea. Genitourinary: Negative for dysuria. Musculoskeletal: Negative for back pain. + b/l leg swelling Skin: Negative for rash. Neurological: Negative for headaches, weakness or numbness. Psych: No SI or HI  ____________________________________________   PHYSICAL EXAM:  VITAL SIGNS: ED Triage Vitals  Enc Vitals Group     BP 06/25/16 1541 (!) 137/100     Pulse Rate 06/25/16 1541 (!) 126     Resp 06/25/16 1541 (!) 26     Temp 06/25/16 1541 100 F (37.8 C)     Temp Source 06/25/16 1541 Oral     SpO2 06/25/16 1541 (!) 86 %     Weight 06/25/16 1542 (!) 437 lb (198.2 kg)     Height 06/25/16 1542 6\' 3"  (1.905 m)     Head Circumference --      Peak Flow --      Pain Score 06/25/16 1542 0     Pain Loc --      Pain Edu? --  Excl. in GC? --     Constitutional: Alert and oriented, moderate respiratory distress.  HEENT:      Head: Normocephalic and atraumatic.         Eyes: Conjunctivae are normal. Sclera is non-icteric. EOMI. PERRL      Mouth/Throat: Mucous membranes are moist.       Neck: Supple with no signs of meningismus. Cardiovascular: Tachycardic with regular rhythm. No murmurs, gallops, or rubs. 2+ symmetrical distal pulses are present in all extremities. No JVD. Respiratory: Tachypnea, speaking 3-4 were sentences, hypoxic on room air and satting 93% on 3 L nasal cannula, diffuse expiratory wheezes throughout  Gastrointestinal: Soft, non tender, and non  distended with positive bowel sounds. No rebound or guarding. Musculoskeletal: 3+ pitting edema b/l Neurologic: Normal speech and language. Face is symmetric. Moving all extremities. No gross focal neurologic deficits are appreciated. Skin: Skin is warm, dry and intact. No rash noted. Psychiatric: Mood and affect are normal. Speech and behavior are normal.  ____________________________________________   LABS (all labs ordered are listed, but only abnormal results are displayed)  Labs Reviewed  BASIC METABOLIC PANEL - Abnormal; Notable for the following:       Result Value   Chloride 99 (*)    Glucose, Bld 124 (*)    All other components within normal limits  CBC - Abnormal; Notable for the following:    RBC 5.97 (*)    RDW 14.6 (*)    All other components within normal limits  TROPONIN I - Abnormal; Notable for the following:    Troponin I 0.04 (*)    All other components within normal limits  BRAIN NATRIURETIC PEPTIDE - Abnormal; Notable for the following:    B Natriuretic Peptide 654.0 (*)    All other components within normal limits  RAPID INFLUENZA A&B ANTIGENS ([facility] ONLY)   ____________________________________________  EKG  ED ECG REPORT I, Nita Sickle, the attending physician, personally viewed and interpreted this ECG.  Sinus tachycardia, rate of 126, normal intervals, normal axis, no ST elevations or depressions. Unchanged from prior ____________________________________________  RADIOLOGY  CXR:  Mild interval improvement in the appearance of the pulmonary interstitium consistent with resolving CHF superimposed upon underlying reactive airway disease. ____________________________________________   PROCEDURES  Procedure(s) performed: None Procedures Critical Care performed: yes  CRITICAL CARE Performed by: Nita Sickle  ?  Total critical care time: 35 min  Critical care time was exclusive of separately billable procedures and treating other  patients.  Critical care was necessary to treat or prevent imminent or life-threatening deterioration.  Critical care was time spent personally by me on the following activities: development of treatment plan with patient and/or surrogate as well as nursing, discussions with consultants, evaluation of patient's response to treatment, examination of patient, obtaining history from patient or surrogate, ordering and performing treatments and interventions, ordering and review of laboratory studies, ordering and review of radiographic studies, pulse oximetry and re-evaluation of patient's condition.  ____________________________________________   INITIAL IMPRESSION / ASSESSMENT AND PLAN / ED COURSE  41 y.o. male a history of obesity and smoking who presents from cardiology clinic for shortness of breath, orthopnea, tachycardia, hypoxia, and productive cough. Patient was seen here 2 days ago for CHF exacerbation which was a new diagnosis. Was started on Lasix and reports that he has lost 14 pounds in the last 2 days, has had decreased swelling on his lower extremities and feels that his shortness of breath has improved however patient continues to have persistent tachycardia  and tachypnea, patient also has new oxygen requirement at this time with diffuse expiratory wheezes throughout. Differential diagnosis including COPD versus influenza versus pneumonia versus pulmonary embolism. We'll send patient for CT of his chest. We'll given DuoNeb treatments and Solu-Medrol. We'll check labs including electrolytes, kidney function, troponin, BNP. We'll watch patient for cardiac telemetry.  Clinical Course as of Jun 26 1711  Wed Jun 25, 2016  1709 CT concerning for pulmonary edema and pneumonia. Patient's troponin is trending up at 0.04. BNP is higher than 2 days ago at 654. Patient will be given IV ceftriaxone, by mouth azithromycin, 40 mg of IV Lasix and will be admitted to the hospitalist service.  [CV]      Clinical Course User Index [CV] Nita Sickle, MD    Pertinent labs & imaging results that were available during my care of the patient were reviewed by me and considered in my medical decision making (see chart for details).    ____________________________________________   FINAL CLINICAL IMPRESSION(S) / ED DIAGNOSES  Final diagnoses:  Acute respiratory failure with hypoxia (HCC)  Community acquired pneumonia, unspecified laterality  Acute on chronic congestive heart failure, unspecified congestive heart failure type (HCC)  COPD exacerbation (HCC)      NEW MEDICATIONS STARTED DURING THIS VISIT:  New Prescriptions   No medications on file     Note:  This document was prepared using Dragon voice recognition software and may include unintentional dictation errors.    Nita Sickle, MD 06/25/16 [phone]

## 2016-06-26 ENCOUNTER — Inpatient Hospital Stay: Payer: Self-pay

## 2016-06-26 ENCOUNTER — Inpatient Hospital Stay
Admit: 2016-06-26 | Discharge: 2016-06-26 | Disposition: A | Discharge status: Home / Self Care | Payer: Self-pay | Attending: Internal Medicine | Admitting: Internal Medicine

## 2016-06-26 LAB — GLUCOSE, CAPILLARY
GLUCOSE-CAPILLARY: 190 mg/dL — AB (ref 65–99)
GLUCOSE-CAPILLARY: 134 mg/dL — AB (ref 65–99)

## 2016-06-26 LAB — CBC
HCT: 50 % (ref 40.0–52.0)
Hemoglobin: 16.4 g/dL (ref 13.0–18.0)
MCH: 29 pg (ref 26.0–34.0)
MCHC: 32.8 g/dL (ref 32.0–36.0)
MCV: 88.5 fL (ref 80.0–100.0)
PLATELETS: 203 10*3/uL (ref 150–440)
RBC: 5.65 MIL/uL (ref 4.40–5.90)
RDW: 14.7 % — AB (ref 11.5–14.5)
WBC: 7.5 10*3/uL (ref 3.8–10.6)

## 2016-06-26 LAB — TROPONIN I: Troponin I: 0.03 ng/mL (ref <0.03)

## 2016-06-26 LAB — EXPECTORATED SPUTUM ASSESSMENT W GRAM STAIN, RFLX TO RESP C: Special Requests: NORMAL

## 2016-06-26 LAB — BASIC METABOLIC PANEL
Anion gap: 9 (ref 5–15)
BUN: 12 mg/dL (ref 6–20)
CALCIUM: 8.5 mg/dL — AB (ref 8.9–10.3)
CO2: 30 mmol/L (ref 22–32)
CREATININE: 0.73 mg/dL (ref 0.61–1.24)
Chloride: 97 mmol/L — ABNORMAL LOW (ref 101–111)
GFR calc non Af Amer: >60 mL/min (ref >60)
Glucose, Bld: 244 mg/dL — ABNORMAL HIGH (ref 65–99)
Potassium: 3.6 mmol/L (ref 3.5–5.1)
Sodium: 136 mmol/L (ref 135–145)

## 2016-06-26 MED ORDER — ENOXAPARIN SODIUM 40 MG/0.4ML ~~LOC~~ SOLN
40.0000 mg | Freq: Two times a day (BID) | SUBCUTANEOUS | Status: DC
  Administered 2016-06-26 – 2016-07-01 (×10): 40 mg via SUBCUTANEOUS
  Filled 2016-06-26 (×10): qty 0.4

## 2016-06-26 MED ORDER — ENOXAPARIN SODIUM 40 MG/0.4ML ~~LOC~~ SOLN: 40.0000 mg | SUBCUTANEOUS | Status: DC

## 2016-06-26 MED ORDER — INSULIN ASPART 100 UNIT/ML ~~LOC~~ SOLN
0.0000 [IU] | Freq: Three times a day (TID) | SUBCUTANEOUS | Status: DC
  Administered 2016-06-26: 2 [IU] via SUBCUTANEOUS
  Administered 2016-06-27 (×2): 1 [IU] via SUBCUTANEOUS
  Administered 2016-06-27: 3 [IU] via SUBCUTANEOUS
  Administered 2016-06-28: 1 [IU] via SUBCUTANEOUS
  Administered 2016-06-28 – 2016-06-29 (×3): 2 [IU] via SUBCUTANEOUS
  Administered 2016-06-29: 1 [IU] via SUBCUTANEOUS
  Administered 2016-06-30: 3 [IU] via SUBCUTANEOUS
  Administered 2016-06-30 (×2): 1 [IU] via SUBCUTANEOUS
  Administered 2016-07-01: 2 [IU] via SUBCUTANEOUS
  Filled 2016-06-26: qty 3
  Filled 2016-06-26 (×3): qty 1
  Filled 2016-06-26: qty 2
  Filled 2016-06-26 (×2): qty 1
  Filled 2016-06-26 (×2): qty 2
  Filled 2016-06-26: qty 3
  Filled 2016-06-26: qty 1
  Filled 2016-06-26 (×3): qty 2

## 2016-06-26 MED ORDER — GUAIFENESIN-DM 100-10 MG/5ML PO SYRP
5.0000 mL | ORAL_SOLUTION | ORAL | Status: DC | PRN
  Administered 2016-06-26 – 2016-06-29 (×4): 5 mL via ORAL
  Filled 2016-06-26 (×4): qty 5

## 2016-06-26 MED ORDER — AZITHROMYCIN 250 MG PO TABS
250.0000 mg | ORAL_TABLET | ORAL | Status: DC
  Administered 2016-06-26 – 2016-06-29 (×4): 250 mg via ORAL
  Filled 2016-06-26 (×4): qty 1

## 2016-06-26 MED ORDER — METHYLPREDNISOLONE SODIUM SUCC 40 MG IJ SOLR
40.0000 mg | Freq: Two times a day (BID) | INTRAMUSCULAR | Status: DC
  Administered 2016-06-26 – 2016-06-28 (×4): 40 mg via INTRAVENOUS
  Filled 2016-06-26 (×4): qty 1

## 2016-06-26 NOTE — Consult Note (Signed)
[facility] Cardiology  CARDIOLOGY CONSULT NOTE  Patient ID: Marc Wright MRN: [id number] DOB/AGE: 02-20-1976 41 y.o.  Admit date: 06/25/2016 Referring Physician Marella Chimes Primary Physician  Primary Cardiologist [facility] Reason for Consultation Congestive heart failure  HPI: 41 year old gentleman referred for evaluation of congestive heart failure. The patient reports that he was in his usual state of health until 06/22/2016 at which time he noted worsening shortness of breath and peripheral edema. He was seen at [facility] emergency room on 06/23/2016 treated with nebulizer therapy and dose of intravenous furosemide with diuresis and overall clinical response and was discharged home. He was seen by Dr. Vennie Homans yesterday at which time the patient was noted be hypoxic and tachypneic, and was sent back to [facility] emergency room. CT scan revealed evidence for bronchopneumonia and pulmonary edema. The patient was admitted to telemetry where he has clinically responded to intravenous furosemide with marked diuresis. According to the patient he has lost nearly 40 pounds during the past week. Admission labs were notable for troponin of 0.06  Review of systems complete and found to be negative unless listed above     Past Medical History:  Diagnosis Date  . Asthma     History reviewed. No pertinent surgical history.  Prescriptions Prior to Admission  Medication Sig Dispense Refill Last Dose  . B Complex-C (B-COMPLEX WITH VITAMIN C) tablet Take 1 tablet by mouth daily.   Past Week at Unknown time  . furosemide (LASIX) 40 MG tablet Take 1 tablet (40 mg total) by mouth daily. 4 tablet 0 06/25/2016 at 1030  . multivitamin (ONE-A-DAY MEN'S) TABS tablet Take 1 tablet by mouth daily.   Past Week at Unknown time  . vitamin C (ASCORBIC ACID) 500 MG tablet Take 500 mg by mouth daily.   Past Week at Unknown time   Social History   Social History  . Marital status: Single    Spouse name: N/A  . Number of children: N/A   . Years of education: N/A   Occupational History  . Not on file.   Social History Main Topics  . Smoking status: Current Every Day Smoker    Packs/day: 0.75    Types: Cigarettes  . Smokeless tobacco: Never Used  . Alcohol use Yes     Comment: occasional   . Drug use: No  . Sexual activity: Not on file   Other Topics Concern  . Not on file   Social History Narrative  . No narrative on file    Family History  Problem Relation Age of Onset  . Congestive Heart Failure Mother   . Hypertension Mother       Review of systems complete and found to be negative unless listed above      PHYSICAL EXAM  General: Well developed, well nourished, in no acute distress HEENT:  Normocephalic and atramatic Neck:  No JVD.  Lungs: Clear bilaterally to auscultation and percussion. Heart: HRRR . Normal S1 and S2 without gallops or murmurs.  Abdomen: Bowel sounds are positive, abdomen soft and non-tender  Msk:  Back normal, normal gait. Normal strength and tone for age. Extremities: No clubbing, cyanosis or edema.   Neuro: Alert and oriented X 3. Psych:  Good affect, responds appropriately  Labs:   Lab Results  Component Value Date   WBC 7.5 06/26/2016   HGB 16.4 06/26/2016   HCT 50.0 06/26/2016   MCV 88.5 06/26/2016   PLT 203 06/26/2016    Recent Labs Lab 06/26/16 0123  NA 136  K 3.6  CL 97*  CO2 30  BUN 12  CREATININE 0.73  CALCIUM 8.5*  GLUCOSE 244*   Lab Results  Component Value Date   TROPONINI 0.03 (HH) 06/26/2016    Lab Results  Component Value Date   CHOL 118 06/25/2016   Lab Results  Component Value Date   HDL 28 (L) 06/25/2016   Lab Results  Component Value Date   LDLCALC 68 06/25/2016   Lab Results  Component Value Date   TRIG 112 06/25/2016   Lab Results  Component Value Date   CHOLHDL 4.2 06/25/2016   No results found for: LDLDIRECT    Radiology: Dg Chest 2 View  Result Date: 06/26/2016 CLINICAL DATA:  Acute CHF. EXAM: CHEST  2 VIEW  COMPARISON:  CT chest January 3rd 2018 FINDINGS: Cardiac silhouette is moderately enlarged unchanged. Pulmonary vascular congestion mild interstitial prominence. Bandlike density LEFT midlung zone. Small pleural effusions. No pneumothorax. Large body habitus. Osseous structures are nonsuspicious. IMPRESSION: Stable cardiomegaly and findings of CHF with small pleural effusions. LEFT midlung zone atelectasis. Electronically Signed   By: Awilda Metro M.D.   On: 06/26/2016 04:44   Dg Chest 2 View  Result Date: 06/25/2016 CLINICAL DATA:  Shortness of breath, chest congestion cough, history of asthma. Current smoker. EXAM: CHEST  2 VIEW COMPARISON:  Chest x-ray of June 23, 2014 FINDINGS: The lungs are well-expanded. The interstitial markings remain increased bilaterally but have improved slightly since yesterday's study. The cardiac silhouette remains enlarged. The pulmonary vascularity is less engorged today. The trachea is midline. There is no pleural effusion. IMPRESSION: Mild interval improvement in the appearance of the pulmonary interstitium consistent with resolving CHF superimposed upon underlying reactive airway disease. Electronically Signed   By: David  Swaziland M.D.   On: 06/25/2016 16:07   Dg Chest 2 View  Result Date: 06/23/2016 CLINICAL DATA:  Shortness breath and cough for 2 days. EXAM: CHEST  2 VIEW COMPARISON:  CT chest March 06, 2016 FINDINGS: The mediastinal contour is normal. The heart size is enlarged. Bilateral interstitial edema is identified. There is no focal pneumonia or pleural effusion. The visualized skeletal structures are unremarkable. IMPRESSION: Congestive heart failure. Electronically Signed   By: Sherian Rein M.D.   On: 06/23/2016 18:49   Ct Angio Chest Pe W And/or Wo Contrast  Result Date: 06/25/2016 CLINICAL DATA:  Cough and shortness of breath over the last 2 days. Pulmonary edema. EXAM: CT ANGIOGRAPHY CHEST WITH CONTRAST TECHNIQUE: Multidetector CT imaging of the  chest was performed using the standard protocol during bolus administration of intravenous contrast. Multiplanar CT image reconstructions and MIPs were obtained to evaluate the vascular anatomy. CONTRAST:  100 cc Isovue 370 COMPARISON:  Radiography same day.  CT 03/06/2016. FINDINGS: Cardiovascular: Pulmonary arterial opacification is good. There are no pulmonary emboli. No aortic atherosclerosis is seen. Coronary artery calcification is noted in the left system, premature for age. No pericardial fluid. Mediastinum/Nodes: Mildly prominent hilar and mediastinal nodes, probably reactive. Lungs/Pleura: Interstitial prominence that could go along with edema. There are small areas of airspace filling in both lungs that are most consistent with widespread bronchopneumonia. These could be areas of airspace filling secondary to edema. There are small effusions layering dependently, right larger than left. Upper Abdomen: Normal Musculoskeletal: No significant finding. Review of the MIP images confirms the above findings. IMPRESSION: No pulmonary emboli. Interstitial edema pattern. Small effusions layering dependently right larger than left. Patchy areas of airspace filling bilaterally most consistent with bronchopneumonia. The differential  diagnosis would be early alveolar edema, but pneumonia is favored. Electronically Signed   By: Paulina Fusi M.D.   On: 06/25/2016 16:59    EKG: Normal sinus rhythm  ASSESSMENT AND PLAN:   1. Congestive heart failure, probable acute diastolic, with vigorous diuresis and overall clinical improvement on intravenous furosemide 2. Respiratory failure, likely multifactorial, secondary to COPD, bronchopneumonia, and acute diastolic congestive heart failure 3. Borderline elevated troponin, likely demand supply ischemia  Recommendations  1. Continue current medications 2. Continue diuresis 3. Carefully monitor renal status 4. Review 2-D echocardiogram 5. Further recommendations  pending echocardiogram results  Signed: Marcina Millard MD,PhD, [facility] 06/26/2016, 8:55 AM

## 2016-06-26 NOTE — Progress Notes (Signed)
Inpatient Diabetes Program Recommendations  AACE/ADA: New Consensus Statement on Inpatient Glycemic Control (2015)  Target Ranges:  Prepandial:   less than 140 mg/dL      Peak postprandial:   less than 180 mg/dL (1-2 hours)      Critically ill patients:  140 - 180 mg/dL   Results for Marc Wright, Marc Wright (MRN [id number]) as of 06/26/2016 09:45  Ref. Range 06/26/2016 01:23  Glucose Latest Ref Range: 65 - 99 mg/dL 563 (H)    Admit with: SOB/ LE Swelling  History: None- Does not see MD on a regular basis      MD- Note Hemoglobin A1c in process.  Note that patient weighs 184 kg.  Received 125 mg IV Solumedrol X 1 dose yesterday at 4pm and now getting Solumedrol 60 mg Q6 hours.  Lab glucose elevated this AM.  Please consider placing orders for Novolog Moderate Correction Scale/ SSI (0-15 units) TID AC + HS      --Will follow patient during hospitalization--  Ambrose Finland RN, MSN, CDE Diabetes Coordinator Inpatient Glycemic Control Team Team Pager: [phone] (8a-5p)

## 2016-06-26 NOTE — Progress Notes (Signed)
Initial Heart Failure Clinic appointment scheduled on July 10, 2016 at 9:00am. Thank you for the referral.

## 2016-06-26 NOTE — Care Management (Signed)
Patient admitted with congestive heart failure which is a new diagnosis for him.  he works full time but does not have insurance.  Says he can not afford it due to child support and taxes.  He does not have a PCP.  Usually goes to urgent care or ED when he gets sick.  He received a prescription from the ED 3 days prior to admission and was able to fill it at [facility].  Independent in all adls, denies issues a with transportation.  Is able to drive.  Discussed referral to heart failure clinic, [facility], and sliding scale clinics.  Patient does not feel he will qualify for [facility] because of his income.  Provided him with applications, [employer] 4 dollar list and list of pcp.  Discussed that CM will closely follow discharge meds to make sure patient will be able to obtain them.  he has the CHF education booklet

## 2016-06-26 NOTE — Discharge Instructions (Signed)
Heart Failure Clinic appointment on July 10, 2016 at 9:00am with Clarisa Kindred, FNP. Please call [phone] to reschedule.

## 2016-06-26 NOTE — Progress Notes (Signed)
[facility] [place] [facility]                                                                                                                                                                                  Patient Demographics   Marc Wright, is a 41 y.o. male, DOB - [birth year], ZPH:[id number]  Admit date - 06/25/2016   Admitting Physician Altamese Dilling, MD  Outpatient Primary MD for the patient is No PCP Per Patient   LOS - 1  Subjective: Patient admitted with shortness of breath his shortness of breath is improved with diuresis continues to complain of productive cough.    Review of Systems:   CONSTITUTIONAL: No documented fever. No fatigue, weakness. No weight gain, no weight loss.  EYES: No blurry or double vision.  ENT: No tinnitus. No postnasal drip. No redness of the oropharynx.  RESPIRATORY: + cough, no wheeze, no hemoptysis. +dyspnea.  CARDIOVASCULAR: No chest pain. No orthopnea. No palpitations. No syncope.  GASTROINTESTINAL: No nausea, no vomiting or diarrhea. No abdominal pain. No melena or hematochezia.  GENITOURINARY: No dysuria or hematuria.  ENDOCRINE: No polyuria or nocturia. No heat or cold intolerance.  HEMATOLOGY: No anemia. No bruising. No bleeding.  INTEGUMENTARY: No rashes. No lesions.  MUSCULOSKELETAL: No arthritis. No swelling. No gout.  NEUROLOGIC: No numbness, tingling, or ataxia. No seizure-type activity.  PSYCHIATRIC: No anxiety. No insomnia. No ADD.    Vitals:   Vitals:   06/26/16 0406 06/26/16 0802 06/26/16 0820 06/26/16 1519  BP: 137/88 130/81  110/90  Pulse: (!) 107 (!) 105  (!) 105  Resp:  16  17  Temp: 98.1 F (36.7 C)   97.9 F (36.6 C)  TempSrc: Oral   Oral  SpO2: 90% 93% 92% 94%  Weight:      Height:        Wt Readings from Last 3 Encounters:  06/26/16 (!) 405 lb 1.6 oz (183.8 kg)  06/23/16 (!) 437 lb (198.2 kg)  03/06/16 (!) 350 lb (158.8 kg)     Intake/Output Summary (Last 24 hours) at 06/26/16  1648 Last data filed at 06/26/16 1513  Gross per 24 hour  Intake              240 ml  Output             3125 ml  Net            -2885 ml    Physical Exam:   GENERAL: Pleasant-appearing in no apparent distress.  HEAD, EYES, EARS, NOSE AND THROAT: Atraumatic, normocephalic. Extraocular muscles are intact. Pupils equal and reactive to light. Sclerae anicteric. No conjunctival injection. No oro-pharyngeal  erythema.  NECK: Supple. There is no jugular venous distention. No bruits, no lymphadenopathy, no thyromegaly.  HEART: Regular rate and rhythm,. No murmurs, no rubs, no clicks.  LUNGS: Bilateral crackles at the bases no sensory muscle usage ABDOMEN: Soft, flat, nontender, nondistended. Has good bowel sounds. No hepatosplenomegaly appreciated.  EXTREMITIES: No evidence of any cyanosis, clubbing, or peripheral edema.  +2 pedal and radial pulses bilaterally.  NEUROLOGIC: The patient is alert, awake, and oriented x3 with no focal motor or sensory deficits appreciated bilaterally.  SKIN: Moist and warm with no rashes appreciated.  Psych: Not anxious, depressed LN: No inguinal LN enlargement    Antibiotics   Anti-infectives    Start     Dose/Rate Route Frequency Ordered Stop   06/26/16 1800  azithromycin (ZITHROMAX) 250 mg in dextrose 5 % 125 mL IVPB  Status:  Discontinued     250 mg 125 mL/hr over 60 Minutes Intravenous Every 24 hours 06/25/16 1735 06/26/16 1605   06/26/16 1800  cefTRIAXone (ROCEPHIN) IVPB 1 g     1 g 100 mL/hr over 30 Minutes Intravenous Every 24 hours 06/25/16 2056     06/26/16 1800  azithromycin (ZITHROMAX) tablet 250 mg     250 mg Oral Every 24 hours 06/26/16 1605     06/25/16 1745  cefTRIAXone (ROCEPHIN) 1 g in dextrose 5 % 50 mL IVPB  Status:  Discontinued     1 g 100 mL/hr over 30 Minutes Intravenous Every 24 hours 06/25/16 1735 06/25/16 2055   06/25/16 1715  cefTRIAXone (ROCEPHIN) 1 g in dextrose 5 % 50 mL IVPB  Status:  Discontinued     1 g 100 mL/hr over 30  Minutes Intravenous  Once 06/25/16 1702 06/25/16 1704   06/25/16 1715  azithromycin (ZITHROMAX) 500 mg in dextrose 5 % 250 mL IVPB     500 mg 250 mL/hr over 60 Minutes Intravenous  Once 06/25/16 1702 06/25/16 1829   06/25/16 1715  cefTRIAXone (ROCEPHIN) IVPB 1 g     1 g 100 mL/hr over 30 Minutes Intravenous  Once 06/25/16 1704 06/25/16 1754      Medications   Scheduled Meds: . azithromycin  250 mg Oral Q24H  . cefTRIAXone  1 g Intravenous Q24H  . enoxaparin (LOVENOX) injection  40 mg Subcutaneous Q12H  . furosemide  20 mg Intravenous Q8H  . Influenza vac split quadrivalent PF  0.5 mL Intramuscular Tomorrow-1000  . insulin aspart  0-9 Units Subcutaneous TID WC  . ipratropium-albuterol  3 mL Nebulization Q6H  . methylPREDNISolone (SOLU-MEDROL) injection  40 mg Intravenous Q12H  . metoprolol tartrate  25 mg Oral BID  . pneumococcal 23 valent vaccine  0.5 mL Intramuscular Tomorrow-1000  . sodium chloride flush  3 mL Intravenous Q12H   Continuous Infusions: PRN Meds:.   Data Review:   Micro Results Recent Results (from the past 240 hour(s))  Rapid Influenza A&B Antigens ([facility] only)     Status: None   Collection Time: 06/25/16  4:55 PM  Result Value Ref Range Status   Influenza A ([facility]) NEGATIVE NEGATIVE Final   Influenza B ([facility]) NEGATIVE NEGATIVE Final  Culture, expectorated sputum-assessment     Status: None   Collection Time: 06/26/16 12:30 PM  Result Value Ref Range Status   Specimen Description SPUTUM  Final   Special Requests Normal  Final   Sputum evaluation THIS SPECIMEN IS ACCEPTABLE FOR SPUTUM CULTURE  Final   Report Status 06/26/2016 FINAL  Final    Radiology Reports Dg  Chest 2 View  Result Date: 06/26/2016 CLINICAL DATA:  Acute CHF. EXAM: CHEST  2 VIEW COMPARISON:  CT chest January 3rd 2018 FINDINGS: Cardiac silhouette is moderately enlarged unchanged. Pulmonary vascular congestion mild interstitial prominence. Bandlike density LEFT midlung zone. Small pleural  effusions. No pneumothorax. Large body habitus. Osseous structures are nonsuspicious. IMPRESSION: Stable cardiomegaly and findings of CHF with small pleural effusions. LEFT midlung zone atelectasis. Electronically Signed   By: Awilda Metro M.D.   On: 06/26/2016 04:44   Dg Chest 2 View  Result Date: 06/25/2016 CLINICAL DATA:  Shortness of breath, chest congestion cough, history of asthma. Current smoker. EXAM: CHEST  2 VIEW COMPARISON:  Chest x-ray of June 23, 2014 FINDINGS: The lungs are well-expanded. The interstitial markings remain increased bilaterally but have improved slightly since yesterday's study. The cardiac silhouette remains enlarged. The pulmonary vascularity is less engorged today. The trachea is midline. There is no pleural effusion. IMPRESSION: Mild interval improvement in the appearance of the pulmonary interstitium consistent with resolving CHF superimposed upon underlying reactive airway disease. Electronically Signed   By: David  Swaziland M.D.   On: 06/25/2016 16:07   Dg Chest 2 View  Result Date: 06/23/2016 CLINICAL DATA:  Shortness breath and cough for 2 days. EXAM: CHEST  2 VIEW COMPARISON:  CT chest March 06, 2016 FINDINGS: The mediastinal contour is normal. The heart size is enlarged. Bilateral interstitial edema is identified. There is no focal pneumonia or pleural effusion. The visualized skeletal structures are unremarkable. IMPRESSION: Congestive heart failure. Electronically Signed   By: Sherian Rein M.D.   On: 06/23/2016 18:49   Ct Angio Chest Pe W And/or Wo Contrast  Result Date: 06/25/2016 CLINICAL DATA:  Cough and shortness of breath over the last 2 days. Pulmonary edema. EXAM: CT ANGIOGRAPHY CHEST WITH CONTRAST TECHNIQUE: Multidetector CT imaging of the chest was performed using the standard protocol during bolus administration of intravenous contrast. Multiplanar CT image reconstructions and MIPs were obtained to evaluate the vascular anatomy. CONTRAST:  100 cc  Isovue 370 COMPARISON:  Radiography same day.  CT 03/06/2016. FINDINGS: Cardiovascular: Pulmonary arterial opacification is good. There are no pulmonary emboli. No aortic atherosclerosis is seen. Coronary artery calcification is noted in the left system, premature for age. No pericardial fluid. Mediastinum/Nodes: Mildly prominent hilar and mediastinal nodes, probably reactive. Lungs/Pleura: Interstitial prominence that could go along with edema. There are small areas of airspace filling in both lungs that are most consistent with widespread bronchopneumonia. These could be areas of airspace filling secondary to edema. There are small effusions layering dependently, right larger than left. Upper Abdomen: Normal Musculoskeletal: No significant finding. Review of the MIP images confirms the above findings. IMPRESSION: No pulmonary emboli. Interstitial edema pattern. Small effusions layering dependently right larger than left. Patchy areas of airspace filling bilaterally most consistent with bronchopneumonia. The differential diagnosis would be early alveolar edema, but pneumonia is favored. Electronically Signed   By: Paulina Fusi M.D.   On: 06/25/2016 16:59     CBC  Recent Labs Lab 06/23/16 1956 06/25/16 1544 06/26/16 0123  WBC 11.0* 9.2 7.5  HGB 16.8 17.3 16.4  HCT 50.9 51.7 50.0  PLT 213 215 203  MCV 88.5 86.5 88.5  MCH 29.3 29.0 29.0  MCHC 33.1 33.5 32.8  RDW 14.5 14.6* 14.7*  LYMPHSABS 1.2  --   --   MONOABS 0.8  --   --   EOSABS 0.1  --   --   BASOSABS 0.1  --   --  Chemistries   Recent Labs Lab 06/23/16 1956 06/25/16 1544 06/26/16 0123  NA 135 137 136  K 3.8 4.0 3.6  CL 100* 99* 97*  CO2 30 32 30  GLUCOSE 139* 124* 244*  BUN 14 11 12   CREATININE 0.69 0.80 0.73  CALCIUM 8.5* 8.9 8.5*   ------------------------------------------------------------------------------------------------------------------ estimated creatinine clearance is 211.3 mL/min (by C-G formula based on  SCr of 0.73 mg/dL). ------------------------------------------------------------------------------------------------------------------ No results for input(s): HGBA1C in the last 72 hours. ------------------------------------------------------------------------------------------------------------------  Recent Labs  06/25/16 1803  CHOL 118  HDL 28*  LDLCALC 68  TRIG 161  CHOLHDL 4.2   ------------------------------------------------------------------------------------------------------------------ No results for input(s): TSH, T4TOTAL, T3FREE, THYROIDAB in the last 72 hours.  Invalid input(s): FREET3 ------------------------------------------------------------------------------------------------------------------ No results for input(s): VITAMINB12, FOLATE, FERRITIN, TIBC, IRON, RETICCTPCT in the last 72 hours.  Coagulation profile No results for input(s): INR, PROTIME in the last 168 hours.  No results for input(s): DDIMER in the last 72 hours.  Cardiac Enzymes  Recent Labs Lab 06/25/16 1803 06/25/16 2128 06/26/16 0123  TROPONINI 0.06* 0.03* 0.03*   ------------------------------------------------------------------------------------------------------------------ Invalid input(s): POCBNP    Assessment & Plan   * Acute hypoxic respiratory failure   Acute COPD exacerbation- taper down steroids   Acute CHF exacerbation- continue therapy with IV Lasix await echocardiogram of the heart   Community-acquired pneumonia- continue therapy with ceftriaxone and azithromycin  *  Hypertension Continue metoprlol  * hyperglycemia hemoglobin A1c pending Place on sliding scale and  * Nicotine abuse smoking cessation provided by admitting md    Code Status Orders        Start     Ordered   06/25/16 2105  Full code  Continuous     06/25/16 2104    Code Status History    Date Active Date Inactive Code Status Order ID Comments User Context   This patient has a current  code status but no historical code status.           Consults  none   DVT Prophylaxis  Lovenox   Lab Results  Component Value Date   PLT 203 06/26/2016     Time Spent in minutes    Greater than 50% of time spent in care coordination and counseling patient regarding the condition and plan of care.   Auburn Bilberry M.D on 06/26/2016 at 4:48 PM  Between 7am to 6pm - Pager - [phone]  After 6pm go to [email or web address] - password [email or web address] [facility]  [facility]   Office  [phone]

## 2016-06-27 LAB — HEMOGLOBIN A1C
HEMOGLOBIN A1C: 7.3 % — AB (ref 4.8–5.6)
Mean Plasma Glucose: 163 mg/dL

## 2016-06-27 LAB — BASIC METABOLIC PANEL
ANION GAP: 5 (ref 5–15)
BUN: 23 mg/dL — AB (ref 6–20)
CALCIUM: 8.4 mg/dL — AB (ref 8.9–10.3)
CO2: 36 mmol/L — ABNORMAL HIGH (ref 22–32)
Chloride: 101 mmol/L (ref 101–111)
Creatinine, Ser: 0.92 mg/dL (ref 0.61–1.24)
GFR calc Af Amer: >60 mL/min (ref >60)
Glucose, Bld: 225 mg/dL — ABNORMAL HIGH (ref 65–99)
POTASSIUM: 4.6 mmol/L (ref 3.5–5.1)
SODIUM: 142 mmol/L (ref 135–145)

## 2016-06-27 LAB — GLUCOSE, CAPILLARY
Glucose-Capillary: 131 mg/dL — ABNORMAL HIGH (ref 65–99)
Glucose-Capillary: 208 mg/dL — ABNORMAL HIGH (ref 65–99)
Glucose-Capillary: 141 mg/dL — ABNORMAL HIGH (ref 65–99)
Glucose-Capillary: 126 mg/dL — ABNORMAL HIGH (ref 65–99)

## 2016-06-27 LAB — ECHOCARDIOGRAM COMPLETE
Height: 75 in
Weight: 6481.6 oz

## 2016-06-27 MED ORDER — ZOLPIDEM TARTRATE 5 MG PO TABS
5.0000 mg | ORAL_TABLET | Freq: Every evening | ORAL | Status: DC | PRN
  Administered 2016-06-27 – 2016-06-30 (×5): 5 mg via ORAL
  Filled 2016-06-27 (×5): qty 1

## 2016-06-27 MED ORDER — BACITRACIN-NEOMYCIN-POLYMYXIN OINTMENT TUBE
TOPICAL_OINTMENT | Freq: Two times a day (BID) | CUTANEOUS | Status: DC
  Administered 2016-06-27 (×2): via TOPICAL
  Administered 2016-06-28: 1 via TOPICAL
  Administered 2016-06-28 – 2016-07-01 (×6): via TOPICAL
  Filled 2016-06-27: qty 15

## 2016-06-27 MED ORDER — LISINOPRIL 5 MG PO TABS
2.5000 mg | ORAL_TABLET | Freq: Every day | ORAL | Status: DC
  Administered 2016-06-27 – 2016-07-01 (×5): 2.5 mg via ORAL
  Filled 2016-06-27 (×5): qty 1

## 2016-06-27 MED ORDER — CARVEDILOL 6.25 MG PO TABS
6.2500 mg | ORAL_TABLET | Freq: Two times a day (BID) | ORAL | Status: DC
  Administered 2016-06-27 – 2016-06-30 (×6): 6.25 mg via ORAL
  Filled 2016-06-27 (×6): qty 1

## 2016-06-27 MED ORDER — SALINE SPRAY 0.65 % NA SOLN
1.0000 | NASAL | Status: DC | PRN
  Administered 2016-06-27 – 2016-06-29 (×2): 1 via NASAL
  Filled 2016-06-27: qty 44

## 2016-06-27 NOTE — Care Management (Addendum)
Patient has been weaned from 80.  His current weight is 404 pound.  Provided patient a set of scales.  Completed MATCH referral and placed it in the shadow chart- as the [facility] is not open on weekends. Explained to patient how to use the referral and to continue to process the [facility] referral .

## 2016-06-27 NOTE — Progress Notes (Signed)
[employer] [facility] [place] [facility]                                                                                                                                                                                  Patient Demographics   Marc Wright, is a 41 y.o. male, DOB - May 29, 1976, ZOX:[id number]  Admit date - 06/25/2016   Admitting Physician Altamese Dilling, MD  Outpatient Primary MD for the patient is No PCP Per Patient   LOS - 2  Subjective: Patient admitted with shortness of breath his shortness of breath is improved with diuresis continues to complain of productive cough.    Review of Systems:   CONSTITUTIONAL: No documented fever. No fatigue, weakness. No weight gain, no weight loss.  EYES: No blurry or double vision.  ENT: No tinnitus. No postnasal drip. No redness of the oropharynx.  RESPIRATORY: + cough, no wheeze, no hemoptysis. +dyspnea.  CARDIOVASCULAR: No chest pain. No orthopnea. No palpitations. No syncope.  GASTROINTESTINAL: No nausea, no vomiting or diarrhea. No abdominal pain. No melena or hematochezia.  GENITOURINARY: No dysuria or hematuria.  ENDOCRINE: No polyuria or nocturia. No heat or cold intolerance.  HEMATOLOGY: No anemia. No bruising. No bleeding.  INTEGUMENTARY: No rashes. No lesions.  MUSCULOSKELETAL: No arthritis. No swelling. No gout.  NEUROLOGIC: No numbness, tingling, or ataxia. No seizure-type activity.  PSYCHIATRIC: No anxiety. No insomnia. No ADD.    Vitals:   Vitals:   06/27/16 0741 06/27/16 1154 06/27/16 1227 06/27/16 1325  BP: 119/77 127/87    Pulse: 84 94    Resp: 17 18    Temp:  98.1 F (36.7 C)    TempSrc:  Oral    SpO2: 100% 91% 93% 94%  Weight:      Height:        Wt Readings from Last 3 Encounters:  06/27/16 (!) 404 lb 1.6 oz (183.3 kg)  06/23/16 (!) 437 lb (198.2 kg)  03/06/16 (!) 350 lb (158.8 kg)     Intake/Output Summary (Last 24 hours) at 06/27/16 1634 Last data filed at 06/27/16 1357   Gross per 24 hour  Intake              960 ml  Output             1100 ml  Net             -140 ml    Physical Exam:   GENERAL: Pleasant-appearing in no apparent distress.  HEAD, EYES, EARS, NOSE AND THROAT: Atraumatic, normocephalic. Extraocular muscles are intact. Pupils equal and reactive to light. Sclerae anicteric. No conjunctival injection. No oro-pharyngeal erythema.  NECK: Supple. There  is no jugular venous distention. No bruits, no lymphadenopathy, no thyromegaly.  HEART: Regular rate and rhythm,. No murmurs, no rubs, no clicks.  LUNGS: Bilateral crackles at the bases no sensory muscle usage ABDOMEN: Soft, flat, nontender, nondistended. Has good bowel sounds. No hepatosplenomegaly appreciated.  EXTREMITIES: No evidence of any cyanosis, clubbing, or peripheral edema.  +2 pedal and radial pulses bilaterally.  NEUROLOGIC: The patient is alert, awake, and oriented x3 with no focal motor or sensory deficits appreciated bilaterally.  SKIN: Moist and warm with no rashes appreciated.  Psych: Not anxious, depressed LN: No inguinal LN enlargement    Antibiotics   Anti-infectives    Start     Dose/Rate Route Frequency Ordered Stop   06/26/16 1800  azithromycin (ZITHROMAX) 250 mg in dextrose 5 % 125 mL IVPB  Status:  Discontinued     250 mg 125 mL/hr over 60 Minutes Intravenous Every 24 hours 06/25/16 1735 06/26/16 1605   06/26/16 1800  cefTRIAXone (ROCEPHIN) IVPB 1 g     1 g 100 mL/hr over 30 Minutes Intravenous Every 24 hours 06/25/16 2056     06/26/16 1800  azithromycin (ZITHROMAX) tablet 250 mg     250 mg Oral Every 24 hours 06/26/16 1605     06/25/16 1745  cefTRIAXone (ROCEPHIN) 1 g in dextrose 5 % 50 mL IVPB  Status:  Discontinued     1 g 100 mL/hr over 30 Minutes Intravenous Every 24 hours 06/25/16 1735 06/25/16 2055   06/25/16 1715  cefTRIAXone (ROCEPHIN) 1 g in dextrose 5 % 50 mL IVPB  Status:  Discontinued     1 g 100 mL/hr over 30 Minutes Intravenous  Once 06/25/16 1702  06/25/16 1704   06/25/16 1715  azithromycin (ZITHROMAX) 500 mg in dextrose 5 % 250 mL IVPB     500 mg 250 mL/hr over 60 Minutes Intravenous  Once 06/25/16 1702 06/25/16 1829   06/25/16 1715  cefTRIAXone (ROCEPHIN) IVPB 1 g     1 g 100 mL/hr over 30 Minutes Intravenous  Once 06/25/16 1704 06/25/16 1754      Medications   Scheduled Meds: . azithromycin  250 mg Oral Q24H  . cefTRIAXone  1 g Intravenous Q24H  . enoxaparin (LOVENOX) injection  40 mg Subcutaneous Q12H  . furosemide  20 mg Intravenous Q8H  . Influenza vac split quadrivalent PF  0.5 mL Intramuscular Tomorrow-1000  . insulin aspart  0-9 Units Subcutaneous TID WC  . ipratropium-albuterol  3 mL Nebulization Q6H  . methylPREDNISolone (SOLU-MEDROL) injection  40 mg Intravenous Q12H  . metoprolol tartrate  25 mg Oral BID  . neomycin-bacitracin-polymyxin   Topical BID  . pneumococcal 23 valent vaccine  0.5 mL Intramuscular Tomorrow-1000  . sodium chloride flush  3 mL Intravenous Q12H   Continuous Infusions: PRN Meds:.   Data Review:   Micro Results Recent Results (from the past 240 hour(s))  Rapid Influenza A&B Antigens ([facility] only)     Status: None   Collection Time: 06/25/16  4:55 PM  Result Value Ref Range Status   Influenza A ([facility]) NEGATIVE NEGATIVE Final   Influenza B ([facility]) NEGATIVE NEGATIVE Final  Culture, expectorated sputum-assessment     Status: None   Collection Time: 06/26/16 12:30 PM  Result Value Ref Range Status   Specimen Description SPUTUM  Final   Special Requests Normal  Final   Sputum evaluation THIS SPECIMEN IS ACCEPTABLE FOR SPUTUM CULTURE  Final   Report Status 06/26/2016 FINAL  Final  Culture, respiratory (NON-Expectorated)  Status: None (Preliminary result)   Collection Time: 06/26/16 12:30 PM  Result Value Ref Range Status   Specimen Description SPUTUM  Final   Special Requests Normal Reflexed from [id number]  Final   Gram Stain   Final    ABUNDANT WBC PRESENT,BOTH PMN AND  MONONUCLEAR ABUNDANT GRAM POSITIVE COCCI IN PAIRS MODERATE GRAM NEGATIVE RODS RARE GRAM NEGATIVE COCCI IN PAIRS RARE GRAM POSITIVE RODS RARE YEAST    Culture   Final    CULTURE REINCUBATED FOR BETTER GROWTH Performed at [facility]    Report Status PENDING  Incomplete    Radiology Reports Dg Chest 2 View  Result Date: 06/26/2016 CLINICAL DATA:  Acute CHF. EXAM: CHEST  2 VIEW COMPARISON:  CT chest January 3rd 2018 FINDINGS: Cardiac silhouette is moderately enlarged unchanged. Pulmonary vascular congestion mild interstitial prominence. Bandlike density LEFT midlung zone. Small pleural effusions. No pneumothorax. Large body habitus. Osseous structures are nonsuspicious. IMPRESSION: Stable cardiomegaly and findings of CHF with small pleural effusions. LEFT midlung zone atelectasis. Electronically Signed   By: Awilda Metro M.D.   On: 06/26/2016 04:44   Dg Chest 2 View  Result Date: 06/25/2016 CLINICAL DATA:  Shortness of breath, chest congestion cough, history of asthma. Current smoker. EXAM: CHEST  2 VIEW COMPARISON:  Chest x-ray of June 23, 2014 FINDINGS: The lungs are well-expanded. The interstitial markings remain increased bilaterally but have improved slightly since yesterday's study. The cardiac silhouette remains enlarged. The pulmonary vascularity is less engorged today. The trachea is midline. There is no pleural effusion. IMPRESSION: Mild interval improvement in the appearance of the pulmonary interstitium consistent with resolving CHF superimposed upon underlying reactive airway disease. Electronically Signed   By: David  [place] M.D.   On: 06/25/2016 16:07   Dg Chest 2 View  Result Date: 06/23/2016 CLINICAL DATA:  Shortness breath and cough for 2 days. EXAM: CHEST  2 VIEW COMPARISON:  CT chest March 06, 2016 FINDINGS: The mediastinal contour is normal. The heart size is enlarged. Bilateral interstitial edema is identified. There is no focal pneumonia or pleural  effusion. The visualized skeletal structures are unremarkable. IMPRESSION: Congestive heart failure. Electronically Signed   By: Sherian Rein M.D.   On: 06/23/2016 18:49   Ct Angio Chest Pe W And/or Wo Contrast  Result Date: 06/25/2016 CLINICAL DATA:  Cough and shortness of breath over the last 2 days. Pulmonary edema. EXAM: CT ANGIOGRAPHY CHEST WITH CONTRAST TECHNIQUE: Multidetector CT imaging of the chest was performed using the standard protocol during bolus administration of intravenous contrast. Multiplanar CT image reconstructions and MIPs were obtained to evaluate the vascular anatomy. CONTRAST:  100 cc Isovue 370 COMPARISON:  Radiography same day.  CT 03/06/2016. FINDINGS: Cardiovascular: Pulmonary arterial opacification is good. There are no pulmonary emboli. No aortic atherosclerosis is seen. Coronary artery calcification is noted in the left system, premature for age. No pericardial fluid. Mediastinum/Nodes: Mildly prominent hilar and mediastinal nodes, probably reactive. Lungs/Pleura: Interstitial prominence that could go along with edema. There are small areas of airspace filling in both lungs that are most consistent with widespread bronchopneumonia. These could be areas of airspace filling secondary to edema. There are small effusions layering dependently, right larger than left. Upper Abdomen: Normal Musculoskeletal: No significant finding. Review of the MIP images confirms the above findings. IMPRESSION: No pulmonary emboli. Interstitial edema pattern. Small effusions layering dependently right larger than left. Patchy areas of airspace filling bilaterally most consistent with bronchopneumonia. The differential diagnosis would be early alveolar edema, but  pneumonia is favored. Electronically Signed   By: Paulina Fusi M.D.   On: 06/25/2016 16:59     CBC  Recent Labs Lab 06/23/16 1956 06/25/16 1544 06/26/16 0123  WBC 11.0* 9.2 7.5  HGB 16.8 17.3 16.4  HCT 50.9 51.7 50.0  PLT 213 215  203  MCV 88.5 86.5 88.5  MCH 29.3 29.0 29.0  MCHC 33.1 33.5 32.8  RDW 14.5 14.6* 14.7*  LYMPHSABS 1.2  --   --   MONOABS 0.8  --   --   EOSABS 0.1  --   --   BASOSABS 0.1  --   --     Chemistries   Recent Labs Lab 06/23/16 1956 06/25/16 1544 06/26/16 0123 06/27/16 1051  NA 135 137 136 142  K 3.8 4.0 3.6 4.6  CL 100* 99* 97* 101  CO2 30 32 30 36*  GLUCOSE 139* 124* 244* 225*  BUN 14 11 12  23*  CREATININE 0.69 0.80 0.73 0.92  CALCIUM 8.5* 8.9 8.5* 8.4*   ------------------------------------------------------------------------------------------------------------------ estimated creatinine clearance is 183.5 mL/min (by C-G formula based on SCr of 0.92 mg/dL). ------------------------------------------------------------------------------------------------------------------  Recent Labs  06/25/16 1803  HGBA1C 7.3*   ------------------------------------------------------------------------------------------------------------------  Recent Labs  06/25/16 1803  CHOL 118  HDL 28*  LDLCALC 68  TRIG 109  CHOLHDL 4.2   ------------------------------------------------------------------------------------------------------------------ No results for input(s): TSH, T4TOTAL, T3FREE, THYROIDAB in the last 72 hours.  Invalid input(s): FREET3 ------------------------------------------------------------------------------------------------------------------ No results for input(s): VITAMINB12, FOLATE, FERRITIN, TIBC, IRON, RETICCTPCT in the last 72 hours.  Coagulation profile No results for input(s): INR, PROTIME in the last 168 hours.  No results for input(s): DDIMER in the last 72 hours.  Cardiac Enzymes  Recent Labs Lab 06/25/16 1803 06/25/16 2128 06/26/16 0123  TROPONINI 0.06* 0.03* 0.03*   ------------------------------------------------------------------------------------------------------------------ Invalid input(s): POCBNP    Assessment & Plan   * Acute  hypoxic respiratory failure   Acute COPD exacerbation- taper down steroids   Acute CHF exacerbation- continue therapy with IV Lasix await echocardiogram of the heartShows significant systolic dysfunction. We'll start him on low-dose Coreg and ACE inhibitor   Community-acquired pneumonia- continue therapy with ceftriaxone and azithromycin await sputum cultures  *  Hypertension Continue metoprlol  * hyperglycemia hemoglobin 7.3 patient needs diet exercise and weight loss start on metformin  * Nicotine abuse smoking cessation provided by admitting md    Code Status Orders        Start     Ordered   06/25/16 2105  Full code  Continuous     06/25/16 2104    Code Status History    Date Active Date Inactive Code Status Order ID Comments User Context   This patient has a current code status but no historical code status.           Consults  none   DVT Prophylaxis  Lovenox   Lab Results  Component Value Date   PLT 203 06/26/2016     Time Spent in minutes    Greater than 50% of time spent in care coordination and counseling patient regarding the condition and plan of care.   Auburn Bilberry M.D on 06/27/2016 at 4:34 PM  Between 7am to 6pm - Pager - [phone]  After 6pm go to [email or web address] - password [email or web address] [facility]  [facility] [place] [facility]   Office  [phone]

## 2016-06-27 NOTE — Progress Notes (Signed)
Patient weaned to room air and o2 saturations are 93% on room air. Patient comfortable in bed and instructed to call if he experiences any  shortness of breath. Will continue to monitor.

## 2016-06-27 NOTE — Progress Notes (Signed)
Inpatient Diabetes Program Recommendations  AACE/ADA: New Consensus Statement on Inpatient Glycemic Control (2015)  Target Ranges:  Prepandial:   less than 140 mg/dL      Peak postprandial:   less than 180 mg/dL (1-2 hours)      Critically ill patients:  140 - 180 mg/dL   Results for Marc Wright, Marc Wright (MRN [id number]) as of 06/27/2016 09:35  Ref. Range 06/26/2016 17:08 06/26/2016 21:14 06/27/2016 07:43  Glucose-Capillary Latest Ref Range: 65 - 99 mg/dL 190 (H) 134 (H) 131 (H)  Results for Marc, Wright (MRN [id number]) as of 06/27/2016 09:35  Ref. Range 06/25/2016 18:03  Hemoglobin A1C Latest Ref Range: 4.8 - 5.6 % 7.3 (H)   Review of Glycemic Control  Diabetes history: No  Outpatient Diabetes medications: NA Current orders for Inpatient glycemic control: Novolog 0-9 units TID with meals  Inpatient Diabetes Program Recommendations: HgbA1C: A1C 7.3% on 06/25/16 indicating an average glucose of 163 mg/dl over the past 2-3 months. Per ADA standards, if A1C 6.5% or greater then criteria met for DM dx. MD, please indicate in note if patient will be dx with DM. If so, please inform patient and nursing staff so bedside RNs can educate patient on DM.  NURSING: If patient is dx with DM, please order (all can be ordered under scope of practice) Living Well with Diabetes booklet, educate patient on DM with each patient interaction, order RD consult for diet education, CM consult for follow up and medication assistance if needed, and consult Inpatient Diabetes Coordinator for follow up.  Thanks, Barnie Alderman, RN, MSN, CDE Diabetes Coordinator Inpatient Diabetes Program [phone] (Team Pager from 8am to 5pm)

## 2016-06-27 NOTE — Progress Notes (Signed)
Patient request a sleeping pill. New orders received from [facility].

## 2016-06-28 LAB — BASIC METABOLIC PANEL
Anion gap: 8 (ref 5–15)
BUN: 27 mg/dL — AB (ref 6–20)
CO2: 34 mmol/L — ABNORMAL HIGH (ref 22–32)
CREATININE: 0.65 mg/dL (ref 0.61–1.24)
Calcium: 9 mg/dL (ref 8.9–10.3)
Chloride: 99 mmol/L — ABNORMAL LOW (ref 101–111)
GFR calc Af Amer: >60 mL/min (ref >60)
GLUCOSE: 148 mg/dL — AB (ref 65–99)
Potassium: 3.9 mmol/L (ref 3.5–5.1)
SODIUM: 141 mmol/L (ref 135–145)

## 2016-06-28 LAB — GLUCOSE, CAPILLARY
GLUCOSE-CAPILLARY: 135 mg/dL — AB (ref 65–99)
Glucose-Capillary: 157 mg/dL — ABNORMAL HIGH (ref 65–99)
Glucose-Capillary: 172 mg/dL — ABNORMAL HIGH (ref 65–99)
Glucose-Capillary: 120 mg/dL — ABNORMAL HIGH (ref 65–99)

## 2016-06-28 MED ORDER — LIVING WELL WITH DIABETES BOOK
Freq: Once | Status: AC
  Administered 2016-06-28: 1
  Filled 2016-06-28: qty 1

## 2016-06-28 MED ORDER — PREDNISONE 50 MG PO TABS
50.0000 mg | ORAL_TABLET | Freq: Every day | ORAL | Status: DC
  Administered 2016-06-29 – 2016-07-01 (×3): 50 mg via ORAL
  Filled 2016-06-28 (×3): qty 1

## 2016-06-28 MED ORDER — METFORMIN HCL 500 MG PO TABS
500.0000 mg | ORAL_TABLET | Freq: Two times a day (BID) | ORAL | Status: DC
  Administered 2016-06-28 – 2016-07-01 (×6): 500 mg via ORAL
  Filled 2016-06-28 (×6): qty 1

## 2016-06-28 NOTE — Progress Notes (Signed)
[employer] -  at [facility]                                                                                                                                                                                  Patient Demographics   Marc Wright, is a 41 y.o. male, DOB - [birth year], ZOX:[id number]  Admit date - 06/25/2016   Admitting Physician Altamese Dilling, MD  Outpatient Primary MD for the patient is No PCP Per Patient   LOS - 3  Subjective: He says he feels better, mild shortness of breath and some phlegm but overall better than yesterday.    Review of Systems:   CONSTITUTIONAL: No documented fever. No fatigue, weakness. No weight gain, no weight loss.  EYES: No blurry or double vision.  ENT: No tinnitus. No postnasal drip. No redness of the oropharynx.  RESPIRATORY: , no hemoptysis. +dyspnea.  CARDIOVASCULAR: No chest pain. No orthopnea. No palpitations. No syncope.  GASTROINTESTINAL: No nausea, no vomiting or diarrhea. No abdominal pain. No melena or hematochezia.  GENITOURINARY: No dysuria or hematuria.  ENDOCRINE: No polyuria or nocturia. No heat or cold intolerance.  HEMATOLOGY: No anemia. No bruising. No bleeding.  INTEGUMENTARY: No rashes. No lesions.  MUSCULOSKELETAL: No arthritis. No swelling. No gout.  NEUROLOGIC: No numbness, tingling, or ataxia. No seizure-type activity.  PSYCHIATRIC: No anxiety. No insomnia. No ADD.    Vitals:   Vitals:   06/27/16 2130 06/28/16 0422 06/28/16 0821 06/28/16 1112  BP:  (!) 107/50 (!) 127/92 (!) 129/93  Pulse:  (!) 37 93 72  Resp:  18 18 16   Temp:  98.2 F (36.8 C)  97.7 F (36.5 C)  TempSrc:  Oral  Oral  SpO2: 92% 91% 99% 91%  Weight:  (!) 181.4 kg (400 lb)    Height:        Wt Readings from Last 3 Encounters:  06/28/16 (!) 181.4 kg (400 lb)  06/23/16 (!) 198.2 kg (437 lb)  03/06/16 (!) 158.8 kg (350 lb)     Intake/Output Summary (Last 24 hours) at 06/28/16 1332 Last data filed at 06/28/16  0936  Gross per 24 hour  Intake              820 ml  Output             2720 ml  Net            -1900 ml    Physical Exam:   GENERAL: Pleasant-appearing in no apparent distress.  HEAD, EYES, EARS, NOSE AND THROAT: Atraumatic, normocephalic. Extraocular muscles are intact. Pupils equal and reactive to light. Sclerae anicteric. No conjunctival injection. No oro-pharyngeal erythema.  NECK:  Supple. There is no jugular venous distention. No bruits, no lymphadenopathy, no thyromegaly.  HEART: Regular rate and rhythm,. No murmurs, no rubs, no clicks.  LUNGS: Bilateral crackles at the bases. no sensory muscle usage ABDOMEN: Soft, flat, nontender, nondistended. Has good bowel sounds. No hepatosplenomegaly appreciated.  EXTREMITIES: No evidence of any cyanosis, clubbing, or peripheral edema.  +2 pedal and radial pulses bilaterally.  NEUROLOGIC: The patient is alert, awake, and oriented x3 with no focal motor or sensory deficits appreciated bilaterally.  SKIN: Moist and warm with no rashes appreciated.  Psych: Not anxious, depressed LN: No inguinal LN enlargement    Antibiotics   Anti-infectives    Start     Dose/Rate Route Frequency Ordered Stop   06/26/16 1800  azithromycin (ZITHROMAX) 250 mg in dextrose 5 % 125 mL IVPB  Status:  Discontinued     250 mg 125 mL/hr over 60 Minutes Intravenous Every 24 hours 06/25/16 1735 06/26/16 1605   06/26/16 1800  cefTRIAXone (ROCEPHIN) IVPB 1 g     1 g 100 mL/hr over 30 Minutes Intravenous Every 24 hours 06/25/16 2056     06/26/16 1800  azithromycin (ZITHROMAX) tablet 250 mg     250 mg Oral Every 24 hours 06/26/16 1605     06/25/16 1745  cefTRIAXone (ROCEPHIN) 1 g in dextrose 5 % 50 mL IVPB  Status:  Discontinued     1 g 100 mL/hr over 30 Minutes Intravenous Every 24 hours 06/25/16 1735 06/25/16 2055   06/25/16 1715  cefTRIAXone (ROCEPHIN) 1 g in dextrose 5 % 50 mL IVPB  Status:  Discontinued     1 g 100 mL/hr over 30 Minutes Intravenous  Once  06/25/16 1702 06/25/16 1704   06/25/16 1715  azithromycin (ZITHROMAX) 500 mg in dextrose 5 % 250 mL IVPB     500 mg 250 mL/hr over 60 Minutes Intravenous  Once 06/25/16 1702 06/25/16 1829   06/25/16 1715  cefTRIAXone (ROCEPHIN) IVPB 1 g     1 g 100 mL/hr over 30 Minutes Intravenous  Once 06/25/16 1704 06/25/16 1754      Medications   Scheduled Meds: . azithromycin  250 mg Oral Q24H  . carvedilol  6.25 mg Oral BID WC  . cefTRIAXone  1 g Intravenous Q24H  . enoxaparin (LOVENOX) injection  40 mg Subcutaneous Q12H  . furosemide  20 mg Intravenous Q8H  . Influenza vac split quadrivalent PF  0.5 mL Intramuscular Tomorrow-1000  . insulin aspart  0-9 Units Subcutaneous TID WC  . ipratropium-albuterol  3 mL Nebulization Q6H  . lisinopril  2.5 mg Oral Daily  . methylPREDNISolone (SOLU-MEDROL) injection  40 mg Intravenous Q12H  . neomycin-bacitracin-polymyxin   Topical BID  . pneumococcal 23 valent vaccine  0.5 mL Intramuscular Tomorrow-1000  . sodium chloride flush  3 mL Intravenous Q12H   Continuous Infusions: PRN Meds:.   Data Review:   Micro Results Recent Results (from the past 240 hour(s))  Rapid Influenza A&B Antigens ([facility] only)     Status: None   Collection Time: 06/25/16  4:55 PM  Result Value Ref Range Status   Influenza A ([facility]) NEGATIVE NEGATIVE Final   Influenza B ([facility]) NEGATIVE NEGATIVE Final  Culture, expectorated sputum-assessment     Status: None   Collection Time: 06/26/16 12:30 PM  Result Value Ref Range Status   Specimen Description SPUTUM  Final   Special Requests Normal  Final   Sputum evaluation THIS SPECIMEN IS ACCEPTABLE FOR SPUTUM CULTURE  Final  Report Status 06/26/2016 FINAL  Final  Culture, respiratory (NON-Expectorated)     Status: None (Preliminary result)   Collection Time: 06/26/16 12:30 PM  Result Value Ref Range Status   Specimen Description SPUTUM  Final   Special Requests Normal Reflexed from [id number]  Final   Gram Stain   Final     ABUNDANT WBC PRESENT,BOTH PMN AND MONONUCLEAR ABUNDANT GRAM POSITIVE COCCI IN PAIRS MODERATE GRAM NEGATIVE RODS RARE GRAM NEGATIVE COCCI IN PAIRS RARE GRAM POSITIVE RODS RARE YEAST    Culture   Final    CULTURE REINCUBATED FOR BETTER GROWTH Performed at [facility]    Report Status PENDING  Incomplete    Radiology Reports Dg Chest 2 View  Result Date: 06/26/2016 CLINICAL DATA:  Acute CHF. EXAM: CHEST  2 VIEW COMPARISON:  CT chest January 3rd 2018 FINDINGS: Cardiac silhouette is moderately enlarged unchanged. Pulmonary vascular congestion mild interstitial prominence. Bandlike density LEFT midlung zone. Small pleural effusions. No pneumothorax. Large body habitus. Osseous structures are nonsuspicious. IMPRESSION: Stable cardiomegaly and findings of CHF with small pleural effusions. LEFT midlung zone atelectasis. Electronically Signed   By: Awilda Metro M.D.   On: 06/26/2016 04:44   Dg Chest 2 View  Result Date: 06/25/2016 CLINICAL DATA:  Shortness of breath, chest congestion cough, history of asthma. Current smoker. EXAM: CHEST  2 VIEW COMPARISON:  Chest x-ray of June 23, 2014 FINDINGS: The lungs are well-expanded. The interstitial markings remain increased bilaterally but have improved slightly since yesterday's study. The cardiac silhouette remains enlarged. The pulmonary vascularity is less engorged today. The trachea is midline. There is no pleural effusion. IMPRESSION: Mild interval improvement in the appearance of the pulmonary interstitium consistent with resolving CHF superimposed upon underlying reactive airway disease. Electronically Signed   By: David  [place] M.D.   On: 06/25/2016 16:07   Dg Chest 2 View  Result Date: 06/23/2016 CLINICAL DATA:  Shortness breath and cough for 2 days. EXAM: CHEST  2 VIEW COMPARISON:  CT chest March 06, 2016 FINDINGS: The mediastinal contour is normal. The heart size is enlarged. Bilateral interstitial edema is identified. There is no  focal pneumonia or pleural effusion. The visualized skeletal structures are unremarkable. IMPRESSION: Congestive heart failure. Electronically Signed   By: Sherian Rein M.D.   On: 06/23/2016 18:49   Ct Angio Chest Pe W And/or Wo Contrast  Result Date: 06/25/2016 CLINICAL DATA:  Cough and shortness of breath over the last 2 days. Pulmonary edema. EXAM: CT ANGIOGRAPHY CHEST WITH CONTRAST TECHNIQUE: Multidetector CT imaging of the chest was performed using the standard protocol during bolus administration of intravenous contrast. Multiplanar CT image reconstructions and MIPs were obtained to evaluate the vascular anatomy. CONTRAST:  100 cc Isovue 370 COMPARISON:  Radiography same day.  CT 03/06/2016. FINDINGS: Cardiovascular: Pulmonary arterial opacification is good. There are no pulmonary emboli. No aortic atherosclerosis is seen. Coronary artery calcification is noted in the left system, premature for age. No pericardial fluid. Mediastinum/Nodes: Mildly prominent hilar and mediastinal nodes, probably reactive. Lungs/Pleura: Interstitial prominence that could go along with edema. There are small areas of airspace filling in both lungs that are most consistent with widespread bronchopneumonia. These could be areas of airspace filling secondary to edema. There are small effusions layering dependently, right larger than left. Upper Abdomen: Normal Musculoskeletal: No significant finding. Review of the MIP images confirms the above findings. IMPRESSION: No pulmonary emboli. Interstitial edema pattern. Small effusions layering dependently right larger than left. Patchy areas of airspace filling  bilaterally most consistent with bronchopneumonia. The differential diagnosis would be early alveolar edema, but pneumonia is favored. Electronically Signed   By: Paulina Fusi M.D.   On: 06/25/2016 16:59     CBC  Recent Labs Lab 06/23/16 1956 06/25/16 1544 06/26/16 0123  WBC 11.0* 9.2 7.5  HGB 16.8 17.3 16.4  HCT  50.9 51.7 50.0  PLT 213 215 203  MCV 88.5 86.5 88.5  MCH 29.3 29.0 29.0  MCHC 33.1 33.5 32.8  RDW 14.5 14.6* 14.7*  LYMPHSABS 1.2  --   --   MONOABS 0.8  --   --   EOSABS 0.1  --   --   BASOSABS 0.1  --   --     Chemistries   Recent Labs Lab 06/23/16 1956 06/25/16 1544 06/26/16 0123 06/27/16 1051 06/28/16 0610  NA 135 137 136 142 141  K 3.8 4.0 3.6 4.6 3.9  CL 100* 99* 97* 101 99*  CO2 30 32 30 36* 34*  GLUCOSE 139* 124* 244* 225* 148*  BUN 14 11 12  23* 27*  CREATININE 0.69 0.80 0.73 0.92 0.65  CALCIUM 8.5* 8.9 8.5* 8.4* 9.0   ------------------------------------------------------------------------------------------------------------------ estimated creatinine clearance is 209.8 mL/min (by C-G formula based on SCr of 0.65 mg/dL). ------------------------------------------------------------------------------------------------------------------  Recent Labs  06/25/16 1803  HGBA1C 7.3*   ------------------------------------------------------------------------------------------------------------------  Recent Labs  06/25/16 1803  CHOL 118  HDL 28*  LDLCALC 68  TRIG 161  CHOLHDL 4.2   ------------------------------------------------------------------------------------------------------------------ No results for input(s): TSH, T4TOTAL, T3FREE, THYROIDAB in the last 72 hours.  Invalid input(s): FREET3 ------------------------------------------------------------------------------------------------------------------ No results for input(s): VITAMINB12, FOLATE, FERRITIN, TIBC, IRON, RETICCTPCT in the last 72 hours.  Coagulation profile No results for input(s): INR, PROTIME in the last 168 hours.  No results for input(s): DDIMER in the last 72 hours.  Cardiac Enzymes  Recent Labs Lab 06/25/16 1803 06/25/16 2128 06/26/16 0123  TROPONINI 0.06* 0.03* 0.03*    ------------------------------------------------------------------------------------------------------------------ Invalid input(s): POCBNP    Assessment & Plan   * Acute hypoxic respiratory failure ;Due to CHF and COPD exacerbation: On room air 91% saturation.   Acute COPD exacerbation-   clinically better no wheezing, wean down steroids  Acute on chronic systolic heart failure, dilated to admit him with EF 25- 30%: Seen by cardiology, continue Coreg, Lasix, lisinopril, follow-up appointment at CHF clinic and discharge    Community-acquired pneumonia- continue therapy with ceftriaxone and azithromycin a  *  Hypertension Continue metoprlol  * Diabetes mellitus type 2:Diabetic teaching, started on low-sodium ADA diet, start metformin, discussed with the patient. Told him that he has diabetes.  * Nicotine abuse smoking cessation provided by admitting md  Likely discharge tomorrow.    Code Status Orders        Start     Ordered   06/25/16 2105  Full code  Continuous     06/25/16 2104    Code Status History    Date Active Date Inactive Code Status Order ID Comments User Context   This patient has a current code status but no historical code status.           Consults  none   DVT Prophylaxis  Lovenox   Lab Results  Component Value Date   PLT 203 06/26/2016     Time Spent in minutes    Greater than 50% of time spent in care coordination and counseling patient regarding the condition and plan of care.   Katha Hamming M.D on 06/28/2016 at 1:32 PM  Between 7am to 6pm - Pager - [phone]  After 6pm go to [email or web address] - password [email or web address] [facility]   Office  [phone]

## 2016-06-28 NOTE — Progress Notes (Signed)
[facility]  SUBJECTIVE: "I'm feeling much better"   Vitals:   06/27/16 2130 06/28/16 0422 06/28/16 0821 06/28/16 1112  BP:  (!) 107/50 (!) 127/92 (!) 129/93  Pulse:  (!) 37 93 72  Resp:  18 18 16   Temp:  98.2 F (36.8 C)  97.7 F (36.5 C)  TempSrc:  Oral  Oral  SpO2: 92% 91% 99% 91%  Weight:  (!) 181.4 kg (400 lb)    Height:         Intake/Output Summary (Last 24 hours) at 06/28/16 1324 Last data filed at 06/28/16 0936  Gross per 24 hour  Intake              820 ml  Output             2720 ml  Net            -1900 ml      PHYSICAL EXAM  General: Well developed, well nourished, in no acute distress HEENT:  Normocephalic and atramatic Neck:  No JVD.  Lungs: Clear bilaterally to auscultation and percussion. Heart: HRRR . Normal S1 and S2 without gallops or murmurs.  Abdomen: Bowel sounds are positive, abdomen soft and non-tender  Msk:  Back normal, normal gait. Normal strength and tone for age. Extremities: 2+ bilateral peripheral edema Neuro: Alert and oriented X 3. Psych:  Good affect, responds appropriately   LABS: Basic Metabolic Panel:  Recent Labs  32/67/12 1051 06/28/16 0610  NA 142 141  K 4.6 3.9  CL 101 99*  CO2 36* 34*  GLUCOSE 225* 148*  BUN 23* 27*  CREATININE 0.92 0.65  CALCIUM 8.4* 9.0   Liver Function Tests: No results for input(s): AST, ALT, ALKPHOS, BILITOT, PROT, ALBUMIN in the last 72 hours. No results for input(s): LIPASE, AMYLASE in the last 72 hours. CBC:  Recent Labs  06/25/16 1544 06/26/16 0123  WBC 9.2 7.5  HGB 17.3 16.4  HCT 51.7 50.0  MCV 86.5 88.5  PLT 215 203   Cardiac Enzymes:  Recent Labs  06/25/16 1803 06/25/16 2128 06/26/16 0123  TROPONINI 0.06* 0.03* 0.03*   BNP: Invalid input(s): POCBNP D-Dimer: No results for input(s): DDIMER in the last 72 hours. Hemoglobin A1C:  Recent Labs  06/25/16 1803  HGBA1C 7.3*   Fasting Lipid Panel:  Recent Labs  06/25/16 1803  CHOL 118  HDL 28*  LDLCALC  68  TRIG 112  CHOLHDL 4.2   Thyroid Function Tests: No results for input(s): TSH, T4TOTAL, T3FREE, THYROIDAB in the last 72 hours.  Invalid input(s): FREET3 Anemia Panel: No results for input(s): VITAMINB12, FOLATE, FERRITIN, TIBC, IRON, RETICCTPCT in the last 72 hours.  No results found.   Echo moderate  to severely reduced left ventricular function, with LV ejection fraction 20-30%  TELEMETRY: Sinus rhythm:  ASSESSMENT AND PLAN:  Active Problems:   Acute CHF (HCC)    1. Respiratory failure, multifactorial 2. Acute systolic congestive heart failure, LV ejection fraction 25-30% 3. COPD/bronchopneumonia 4. Morbid obesity 5. Borderline elevated troponin, likely demand supply ischemia  Recommendations  1. Continue diuresis 2. Carefully monitor renal status 3. Agree with starting carvedilol and ACE inhibitor 4. Will likely require further workup for possible ischemic heart disease, with Lexiscan Myoview versus cardiac catheterization, which could be done as outpatient with Dr. Evelena Leyden, MD, PhD, [facility] 06/28/2016 1:24 PM

## 2016-06-28 NOTE — Progress Notes (Signed)
Discussed new diagnoses of Diabetes with patient including: diet, exercise, need for proper foot care- Living W/ Diabetes book given to patient per order and current order order that includes 1200 ml fluid restriction. Patient verbalized understanding, but needs continued support and reinforcement. Patient also discussed concerns about heart with this nurse. Nurse allowed patient to vocalize and offered support to patient. Patient's evening O2 sats checked twice and were below 90%, patient placed back on 2liters via nasal cannuala. Consult to Diabetes Coordinator also placed. Patient up to chair today for a period of time. Patient denied any pain today. Patient encouraged to place barrier cream in between thighs to decrease redness and prevent any breakdown as redness and slight irritation was noted but no broken skin on assessment this morning.

## 2016-06-29 LAB — GLUCOSE, CAPILLARY
GLUCOSE-CAPILLARY: 109 mg/dL — AB (ref 65–99)
GLUCOSE-CAPILLARY: 146 mg/dL — AB (ref 65–99)
Glucose-Capillary: 185 mg/dL — ABNORMAL HIGH (ref 65–99)

## 2016-06-29 LAB — CULTURE, RESPIRATORY W GRAM STAIN
Culture: NORMAL
Special Requests: NORMAL

## 2016-06-29 LAB — CULTURE, RESPIRATORY

## 2016-06-29 MED ORDER — LIVING WELL WITH DIABETES BOOK
Freq: Once | Status: AC
  Administered 2016-06-29: 15:00:00
  Filled 2016-06-29: qty 1

## 2016-06-29 NOTE — Progress Notes (Signed)
[facility] Cardiology  SUBJECTIVE: "I don't have chest pain"   Vitals:   06/28/16 1934 06/28/16 2037 06/29/16 0356 06/29/16 0754  BP: 129/90  (!) 145/95 129/88  Pulse: 94  96 95  Resp: 18  16 17   Temp: 98 F (36.7 C)  98 F (36.7 C) 97.8 F (36.6 C)  TempSrc: Oral  Oral Oral  SpO2: 94% 93% 92% 95%  Weight:   (!) 178.8 kg (394 lb 3.2 oz)   Height:         Intake/Output Summary (Last 24 hours) at 06/29/16 1121 Last data filed at 06/29/16 0900  Gross per 24 hour  Intake              820 ml  Output             6045 ml  Net            -5225 ml      PHYSICAL EXAM  General: Well developed, well nourished, in no acute distress HEENT:  Normocephalic and atramatic Neck:  No JVD.  Lungs: Clear bilaterally to auscultation and percussion. Heart: HRRR . Normal S1 and S2 without gallops or murmurs.  Abdomen: Bowel sounds are positive, abdomen soft and non-tender  Msk:  Back normal, normal gait. Normal strength and tone for age. Extremities: 2+ bilateral pedal edema  Neuro: Alert and oriented X 3. Psych:  Good affect, responds appropriately   LABS: Basic Metabolic Panel:  Recent Labs  16/94/50 1051 06/28/16 0610  NA 142 141  K 4.6 3.9  CL 101 99*  CO2 36* 34*  GLUCOSE 225* 148*  BUN 23* 27*  CREATININE 0.92 0.65  CALCIUM 8.4* 9.0   Liver Function Tests: No results for input(s): AST, ALT, ALKPHOS, BILITOT, PROT, ALBUMIN in the last 72 hours. No results for input(s): LIPASE, AMYLASE in the last 72 hours. CBC: No results for input(s): WBC, NEUTROABS, HGB, HCT, MCV, PLT in the last 72 hours. Cardiac Enzymes: No results for input(s): CKTOTAL, CKMB, CKMBINDEX, TROPONINI in the last 72 hours. BNP: Invalid input(s): POCBNP D-Dimer: No results for input(s): DDIMER in the last 72 hours. Hemoglobin A1C: No results for input(s): HGBA1C in the last 72 hours. Fasting Lipid Panel: No results for input(s): CHOL, HDL, LDLCALC, TRIG, CHOLHDL, LDLDIRECT in the last 72 hours. Thyroid  Function Tests: No results for input(s): TSH, T4TOTAL, T3FREE, THYROIDAB in the last 72 hours.  Invalid input(s): FREET3 Anemia Panel: No results for input(s): VITAMINB12, FOLATE, FERRITIN, TIBC, IRON, RETICCTPCT in the last 72 hours.  No results found.   Echo LVEF 25-30%  TELEMETRY: Sinus rhythm:  ASSESSMENT AND PLAN:  Active Problems:   Acute CHF (HCC)    1. Respiratory failure, multifactorial, secondary to acute systolic CHF, COPD, bronchopneumonia 2. Acute systolic congestive heart failure, LVEF 25-30%, improved after diuresis 3. COPD/bronchopneumonia 4. Morbid obesity 5. Borderline elevated troponin, likely due to demand supply ischemia  Recommendations  1. Continue diuresis 2. Carefully monitor renal status 3. Continue carvedilol and ACE inhibitor therapy 4. Follow-up with Dr. Juliann Pares with possible Lexiscan Myoview versus cardiac catheterization as outpatient  Signed off for now, please call if any questions   Marcina Millard, MD, PhD, [facility] 06/29/2016 11:21 AM

## 2016-06-29 NOTE — Progress Notes (Signed)
Referral received.  Ordered Living Well with Diabetes booklet for patient.  Will have Diabetes Coordinator see patient on 07/01/15.  Thanks, Beryl Meager, RN, BC-ADM Inpatient Diabetes Coordinator Pager [phone] (8a-5p)

## 2016-06-29 NOTE — Progress Notes (Signed)
[employer] [facility]                                                                                                                                                                                  Patient Demographics   Marc Wright, is a 41 y.o. male, DOB - Jul 02, 1975, GEX:[id number]  Admit date - 06/25/2016   Admitting Physician Altamese Dilling, MD  Outpatient Primary MD for the patient is No PCP Per Patient   LOS - 4  Subjective: Patient had hypoxia , O2 sats 90% on room air, started on oxygen 2 L. Does have obesity and hypoventilation and possible sleep apnea. No obvious distress. Does have some cough. No shortness of breath. Weight is down to 394 pounds today.   Review of Systems:   CONSTITUTIONAL: No documented fever. No fatigue, weakness. No weight gain, no weight loss.  Morbidly . Obese male not in distress. EYES: No blurry or double vision.  ENT: No tinnitus. No postnasal drip. No redness of the oropharynx.  RESPIRATORY: , no hemoptysis. +dyspnea.  CARDIOVASCULAR: No chest pain. No orthopnea. No palpitations. No syncope.  GASTROINTESTINAL: No nausea, no vomiting or diarrhea. No abdominal pain. No melena or hematochezia.  GENITOURINARY: No dysuria or hematuria.  ENDOCRINE: No polyuria or nocturia. No heat or cold intolerance.  HEMATOLOGY: No anemia. No bruising. No bleeding.  INTEGUMENTARY: No rashes. No lesions.  MUSCULOSKELETAL: No arthritis. No swelling. No gout.  NEUROLOGIC: No numbness, tingling, or ataxia. No seizure-type activity.  PSYCHIATRIC: No anxiety. No insomnia. No ADD.    Vitals:   Vitals:   06/28/16 1934 06/28/16 2037 06/29/16 0356 06/29/16 0754  BP: 129/90  (!) 145/95 129/88  Pulse: 94  96 95  Resp: 18  16 17   Temp: 98 F (36.7 C)  98 F (36.7 C) 97.8 F (36.6 C)  TempSrc: Oral  Oral Oral  SpO2: 94% 93% 92% 95%  Weight:   (!) 178.8 kg (394 lb 3.2 oz)   Height:        Wt Readings from Last 3 Encounters:   06/29/16 (!) 178.8 kg (394 lb 3.2 oz)  06/23/16 (!) 198.2 kg (437 lb)  03/06/16 (!) 158.8 kg (350 lb)     Intake/Output Summary (Last 24 hours) at 06/29/16 0933 Last data filed at 06/29/16 0859  Gross per 24 hour  Intake              700 ml  Output             6045 ml  Net            -5345 ml    Physical Exam:  GENERAL: Pleasant-appearing in no apparent distress.  HEAD, EYES, EARS, NOSE AND THROAT: Atraumatic, normocephalic. Extraocular muscles are intact. Pupils equal and reactive to light. Sclerae anicteric. No conjunctival injection. No oro-pharyngeal erythema.  NECK: Supple. There is no jugular venous distention. No bruits, no lymphadenopathy, no thyromegaly.  HEART: Regular rate and rhythm,. No murmurs, no rubs, no clicks.  LUNGS: Bilateral crackles at the bases. no  Accessory muscle usage ABDOMEN: Soft, flat, nontender, nondistended. Has good bowel sounds. No hepatosplenomegaly appreciated.  EXTREMITIES: No evidence of any cyanosis, clubbing, or peripheral edema.  +2 pedal and radial pulses bilaterally. The bilateral patella bilaterally edema to thighs. Does have a small blister on the dorsal the aspect of the  Left foot.  NEUROLOGIC: The patient is alert, awake, and oriented x3 with no focal motor or sensory deficits appreciated bilaterally.   SKIN: Moist and warm with no rashes appreciated.   Psych: Not anxious, depressed LN: No inguinal LN enlargement    Antibiotics   Anti-infectives    Start     Dose/Rate Route Frequency Ordered Stop   06/26/16 1800  azithromycin (ZITHROMAX) 250 mg in dextrose 5 % 125 mL IVPB  Status:  Discontinued     250 mg 125 mL/hr over 60 Minutes Intravenous Every 24 hours 06/25/16 1735 06/26/16 1605   06/26/16 1800  cefTRIAXone (ROCEPHIN) IVPB 1 g     1 g 100 mL/hr over 30 Minutes Intravenous Every 24 hours 06/25/16 2056     06/26/16 1800  azithromycin (ZITHROMAX) tablet 250 mg     250 mg Oral Every 24 hours 06/26/16 1605     06/25/16  1745  cefTRIAXone (ROCEPHIN) 1 g in dextrose 5 % 50 mL IVPB  Status:  Discontinued     1 g 100 mL/hr over 30 Minutes Intravenous Every 24 hours 06/25/16 1735 06/25/16 2055   06/25/16 1715  cefTRIAXone (ROCEPHIN) 1 g in dextrose 5 % 50 mL IVPB  Status:  Discontinued     1 g 100 mL/hr over 30 Minutes Intravenous  Once 06/25/16 1702 06/25/16 1704   06/25/16 1715  azithromycin (ZITHROMAX) 500 mg in dextrose 5 % 250 mL IVPB     500 mg 250 mL/hr over 60 Minutes Intravenous  Once 06/25/16 1702 06/25/16 1829   06/25/16 1715  cefTRIAXone (ROCEPHIN) IVPB 1 g     1 g 100 mL/hr over 30 Minutes Intravenous  Once 06/25/16 1704 06/25/16 1754      Medications   Scheduled Meds: . azithromycin  250 mg Oral Q24H  . carvedilol  6.25 mg Oral BID WC  . cefTRIAXone  1 g Intravenous Q24H  . enoxaparin (LOVENOX) injection  40 mg Subcutaneous Q12H  . furosemide  20 mg Intravenous Q8H  . insulin aspart  0-9 Units Subcutaneous TID WC  . ipratropium-albuterol  3 mL Nebulization Q6H  . lisinopril  2.5 mg Oral Daily  . metFORMIN  500 mg Oral BID WC  . neomycin-bacitracin-polymyxin   Topical BID  . predniSONE  50 mg Oral Q breakfast  . sodium chloride flush  3 mL Intravenous Q12H   Continuous Infusions: PRN Meds:.   Data Review:   Micro Results Recent Results (from the past 240 hour(s))  Rapid Influenza A&B Antigens ([facility] only)     Status: None   Collection Time: 06/25/16  4:55 PM  Result Value Ref Range Status   Influenza A ([facility]) NEGATIVE NEGATIVE Final   Influenza B ([facility]) NEGATIVE NEGATIVE Final  Culture, expectorated sputum-assessment  Status: None   Collection Time: 06/26/16 12:30 PM  Result Value Ref Range Status   Specimen Description SPUTUM  Final   Special Requests Normal  Final   Sputum evaluation THIS SPECIMEN IS ACCEPTABLE FOR SPUTUM CULTURE  Final   Report Status 06/26/2016 FINAL  Final  Culture, respiratory (NON-Expectorated)     Status: None (Preliminary result)   Collection  Time: 06/26/16 12:30 PM  Result Value Ref Range Status   Specimen Description SPUTUM  Final   Special Requests Normal Reflexed from [id number]  Final   Gram Stain   Final    ABUNDANT WBC PRESENT,BOTH PMN AND MONONUCLEAR ABUNDANT GRAM POSITIVE COCCI IN PAIRS MODERATE GRAM NEGATIVE RODS RARE GRAM NEGATIVE COCCI IN PAIRS RARE GRAM POSITIVE RODS RARE YEAST    Culture   Final    CULTURE REINCUBATED FOR BETTER GROWTH Performed at [facility]    Report Status PENDING  Incomplete    Radiology Reports Dg Chest 2 View  Result Date: 06/26/2016 CLINICAL DATA:  Acute CHF. EXAM: CHEST  2 VIEW COMPARISON:  CT chest January 3rd 2018 FINDINGS: Cardiac silhouette is moderately enlarged unchanged. Pulmonary vascular congestion mild interstitial prominence. Bandlike density LEFT midlung zone. Small pleural effusions. No pneumothorax. Large body habitus. Osseous structures are nonsuspicious. IMPRESSION: Stable cardiomegaly and findings of CHF with small pleural effusions. LEFT midlung zone atelectasis. Electronically Signed   By: Awilda Metro M.D.   On: 06/26/2016 04:44   Dg Chest 2 View  Result Date: 06/25/2016 CLINICAL DATA:  Shortness of breath, chest congestion cough, history of asthma. Current smoker. EXAM: CHEST  2 VIEW COMPARISON:  Chest x-ray of June 23, 2014 FINDINGS: The lungs are well-expanded. The interstitial markings remain increased bilaterally but have improved slightly since yesterday's study. The cardiac silhouette remains enlarged. The pulmonary vascularity is less engorged today. The trachea is midline. There is no pleural effusion. IMPRESSION: Mild interval improvement in the appearance of the pulmonary interstitium consistent with resolving CHF superimposed upon underlying reactive airway disease. Electronically Signed   By: David  [place] M.D.   On: 06/25/2016 16:07   Dg Chest 2 View  Result Date: 06/23/2016 CLINICAL DATA:  Shortness breath and cough for 2 days. EXAM: CHEST  2  VIEW COMPARISON:  CT chest March 06, 2016 FINDINGS: The mediastinal contour is normal. The heart size is enlarged. Bilateral interstitial edema is identified. There is no focal pneumonia or pleural effusion. The visualized skeletal structures are unremarkable. IMPRESSION: Congestive heart failure. Electronically Signed   By: Sherian Rein M.D.   On: 06/23/2016 18:49   Ct Angio Chest Pe W And/or Wo Contrast  Result Date: 06/25/2016 CLINICAL DATA:  Cough and shortness of breath over the last 2 days. Pulmonary edema. EXAM: CT ANGIOGRAPHY CHEST WITH CONTRAST TECHNIQUE: Multidetector CT imaging of the chest was performed using the standard protocol during bolus administration of intravenous contrast. Multiplanar CT image reconstructions and MIPs were obtained to evaluate the vascular anatomy. CONTRAST:  100 cc Isovue 370 COMPARISON:  Radiography same day.  CT 03/06/2016. FINDINGS: Cardiovascular: Pulmonary arterial opacification is good. There are no pulmonary emboli. No aortic atherosclerosis is seen. Coronary artery calcification is noted in the left system, premature for age. No pericardial fluid. Mediastinum/Nodes: Mildly prominent hilar and mediastinal nodes, probably reactive. Lungs/Pleura: Interstitial prominence that could go along with edema. There are small areas of airspace filling in both lungs that are most consistent with widespread bronchopneumonia. These could be areas of airspace filling secondary to edema. There are  small effusions layering dependently, right larger than left. Upper Abdomen: Normal Musculoskeletal: No significant finding. Review of the MIP images confirms the above findings. IMPRESSION: No pulmonary emboli. Interstitial edema pattern. Small effusions layering dependently right larger than left. Patchy areas of airspace filling bilaterally most consistent with bronchopneumonia. The differential diagnosis would be early alveolar edema, but pneumonia is favored. Electronically  Signed   By: Paulina Fusi M.D.   On: 06/25/2016 16:59     CBC  Recent Labs Lab 06/23/16 1956 06/25/16 1544 06/26/16 0123  WBC 11.0* 9.2 7.5  HGB 16.8 17.3 16.4  HCT 50.9 51.7 50.0  PLT 213 215 203  MCV 88.5 86.5 88.5  MCH 29.3 29.0 29.0  MCHC 33.1 33.5 32.8  RDW 14.5 14.6* 14.7*  LYMPHSABS 1.2  --   --   MONOABS 0.8  --   --   EOSABS 0.1  --   --   BASOSABS 0.1  --   --     Chemistries   Recent Labs Lab 06/23/16 1956 06/25/16 1544 06/26/16 0123 06/27/16 1051 06/28/16 0610  NA 135 137 136 142 141  K 3.8 4.0 3.6 4.6 3.9  CL 100* 99* 97* 101 99*  CO2 30 32 30 36* 34*  GLUCOSE 139* 124* 244* 225* 148*  BUN 14 11 12  23* 27*  CREATININE 0.69 0.80 0.73 0.92 0.65  CALCIUM 8.5* 8.9 8.5* 8.4* 9.0   ------------------------------------------------------------------------------------------------------------------ estimated creatinine clearance is 207.9 mL/min (by C-G formula based on SCr of 0.65 mg/dL). ------------------------------------------------------------------------------------------------------------------ No results for input(s): HGBA1C in the last 72 hours. ------------------------------------------------------------------------------------------------------------------ No results for input(s): CHOL, HDL, LDLCALC, TRIG, CHOLHDL, LDLDIRECT in the last 72 hours. ------------------------------------------------------------------------------------------------------------------ No results for input(s): TSH, T4TOTAL, T3FREE, THYROIDAB in the last 72 hours.  Invalid input(s): FREET3 ------------------------------------------------------------------------------------------------------------------ No results for input(s): VITAMINB12, FOLATE, FERRITIN, TIBC, IRON, RETICCTPCT in the last 72 hours.  Coagulation profile No results for input(s): INR, PROTIME in the last 168 hours.  No results for input(s): DDIMER in the last 72 hours.  Cardiac Enzymes  Recent Labs Lab  06/25/16 1803 06/25/16 2128 06/26/16 0123  TROPONINI 0.06* 0.03* 0.03*   ------------------------------------------------------------------------------------------------------------------ Invalid input(s): POCBNP    Assessment & Plan   * Acute hypoxic respiratory failure ;Due to CHF and COPD exacerbation: On room air 91% saturation.   Acute COPD exacerbation-   clinically better no wheezing, wean down steroids  Acute on chronic systolic heart failure, dilated Cardiomyopathy with EF 25- 30%: Seen by cardiology, continue Coreg, Lasix, lisinopril, follow-up appointment at CHF clinic and discharge;Angel IV, Lasix to by mouth Lasix today. Seen by cardiology, discussed about CHF diagnosis with the patient about weight management, fluid restriction, salt restriction.    Community-acquired pneumonia- continue therapy with ceftriaxone and azithromycin . Sputum cultures showed gram-positive cocci,.Fred gram-negative rods,  Yeast  Continue present antibiotics.  *  Hypertension; controlled, continue beta blockers, Ace inhibitors, furosemide.  Continue metoprlol  * Diabetes mellitus type 2:Diabetic teaching, started on low-sodium ADA diet, start metformin, discussed with the patient. Told him that he has diabetes.Patient needs diabetic educator teaching while in the hospital because this is a new diagnosis and also he has multiple other problems of heart failure, morbid obesity, essential hypertension. Explained to him that he needs annual eye check ups, also needs podiatry consult at discharge as an outpatient. Also on insulin sliding scale coverage, started metformin yesterday.  * Nicotine abuse smoking cessation provided by admitting md Marked obesity, hypoventilation possibly has sleep apnea needs sleep studies as  an outpatient. Also check overnight pulse oximetry.   Likely discharge tomorrow. D/w RN>    Code Status Orders        Start     Ordered   06/25/16 2105  Full code  Continuous      06/25/16 2104    Code Status History    Date Active Date Inactive Code Status Order ID Comments User Context   This patient has a current code status but no historical code status.           Consults  none   DVT Prophylaxis  Lovenox   Lab Results  Component Value Date   PLT 203 06/26/2016     Time Spent in minutes    Greater than 50% of time spent in care coordination and counseling patient regarding the condition and plan of care.   Katha Hamming M.D on 06/29/2016 at 9:33 AM  Between 7am to 6pm - Pager - [phone]  After 6pm go to [email or web address] - password [email or web address] [facility]  [facility]   Office  [phone]

## 2016-06-30 LAB — BASIC METABOLIC PANEL
ANION GAP: 8 (ref 5–15)
BUN: 25 mg/dL — ABNORMAL HIGH (ref 6–20)
CALCIUM: 9.2 mg/dL (ref 8.9–10.3)
CO2: 33 mmol/L — ABNORMAL HIGH (ref 22–32)
Chloride: 96 mmol/L — ABNORMAL LOW (ref 101–111)
Creatinine, Ser: 0.87 mg/dL (ref 0.61–1.24)
GLUCOSE: 202 mg/dL — AB (ref 65–99)
Potassium: 4.3 mmol/L (ref 3.5–5.1)
SODIUM: 137 mmol/L (ref 135–145)

## 2016-06-30 LAB — GLUCOSE, CAPILLARY
GLUCOSE-CAPILLARY: 156 mg/dL — AB (ref 65–99)
GLUCOSE-CAPILLARY: 145 mg/dL — AB (ref 65–99)
GLUCOSE-CAPILLARY: 146 mg/dL — AB (ref 65–99)
GLUCOSE-CAPILLARY: 235 mg/dL — AB (ref 65–99)
Glucose-Capillary: 127 mg/dL — ABNORMAL HIGH (ref 65–99)

## 2016-06-30 MED ORDER — INSULIN ASPART 100 UNIT/ML ~~LOC~~ SOLN: 0.0000 [IU] | Freq: Three times a day (TID) | SUBCUTANEOUS | 11 refills | Status: DC

## 2016-06-30 MED ORDER — AMOXICILLIN-POT CLAVULANATE 875-125 MG PO TABS: 1.0000 | ORAL_TABLET | Freq: Two times a day (BID) | ORAL | 0 refills | Status: DC

## 2016-06-30 MED ORDER — FUROSEMIDE 40 MG PO TABS: 40.0000 mg | ORAL_TABLET | Freq: Two times a day (BID) | ORAL | 0 refills | Status: DC

## 2016-06-30 MED ORDER — LISINOPRIL 2.5 MG PO TABS: 2.5000 mg | ORAL_TABLET | Freq: Every day | ORAL | 0 refills | Status: DC

## 2016-06-30 MED ORDER — DEXTROMETHORPHAN POLISTIREX ER 30 MG/5ML PO SUER
30.0000 mg | Freq: Two times a day (BID) | ORAL | Status: DC | PRN
  Filled 2016-06-30: qty 5

## 2016-06-30 MED ORDER — GUAIFENESIN 100 MG/5ML PO SOLN
5.0000 mL | ORAL | Status: DC | PRN
  Administered 2016-06-30: 100 mg via ORAL
  Filled 2016-06-30 (×2): qty 5

## 2016-06-30 MED ORDER — CARVEDILOL 12.5 MG PO TABS: 12.5000 mg | ORAL_TABLET | Freq: Two times a day (BID) | ORAL | 0 refills | Status: DC

## 2016-06-30 MED ORDER — CARVEDILOL 6.25 MG PO TABS
6.2500 mg | ORAL_TABLET | Freq: Once | ORAL | Status: AC
  Administered 2016-06-30: 6.25 mg via ORAL
  Filled 2016-06-30: qty 1

## 2016-06-30 MED ORDER — PREDNISONE 10 MG (21) PO TBPK: 10.0000 mg | ORAL_TABLET | Freq: Every day | ORAL | 0 refills | Status: DC

## 2016-06-30 MED ORDER — CARVEDILOL 12.5 MG PO TABS
12.5000 mg | ORAL_TABLET | Freq: Two times a day (BID) | ORAL | Status: DC
  Administered 2016-06-30 – 2016-07-01 (×2): 12.5 mg via ORAL
  Filled 2016-06-30 (×2): qty 1

## 2016-06-30 MED ORDER — METFORMIN HCL 500 MG PO TABS: 500.0000 mg | ORAL_TABLET | Freq: Two times a day (BID) | ORAL | 0 refills | Status: DC

## 2016-06-30 NOTE — Progress Notes (Signed)
[employer] [facility] [place] [facility]                                                                                                                                                                                  Patient Demographics   Marc Wright, is a 41 y.o. male, DOB - 19-Jan-1976, ZOX:[id number]  Admit date - 06/25/2016   Admitting Physician Altamese Dilling, MD  Outpatient Primary MD for the patient is No PCP Per Patient   LOS - 5  Subjective: Discharge cancelled due to his nocturnal desaturation and need for cpap at night.had 15 beat run of asymtomatic vtach his potassium is fine. Pt  Says he does not  have any sob.  Review of Systems:   CONSTITUTIONAL: No documented fever. No fatigue, weakness. No weight gain, no weight loss.  Morbidly . Obese male not in distress. EYES: No blurry or double vision.  ENT: No tinnitus. No postnasal drip. No redness of the oropharynx.  RESPIRATORY: , no hemoptysis. +dyspnea.  CARDIOVASCULAR: No chest pain. No orthopnea. No palpitations. No syncope.  GASTROINTESTINAL: No nausea, no vomiting or diarrhea. No abdominal pain. No melena or hematochezia.  GENITOURINARY: No dysuria or hematuria.  ENDOCRINE: No polyuria or nocturia. No heat or cold intolerance.  HEMATOLOGY: No anemia. No bruising. No bleeding.  INTEGUMENTARY: No rashes. No lesions.  MUSCULOSKELETAL: No arthritis. No swelling. No gout. Has 2 plus bilateral pedal edema, NEUROLOGIC: No numbness, tingling, or ataxia. No seizure-type activity.  PSYCHIATRIC: No anxiety. No insomnia. No ADD.    Vitals:   Vitals:   06/29/16 2108 06/30/16 0340 06/30/16 0735 06/30/16 1208  BP: (!) 141/97 130/89  124/89  Pulse: 93 95    Resp: 18 14  18   Temp: 97.8 F (36.6 C) 97.9 F (36.6 C)  97.7 F (36.5 C)  TempSrc: Oral Oral  Oral  SpO2: 94% 90% 90%   Weight:  (!) 175 kg (385 lb 14.4 oz)    Height:        Wt Readings from Last 3 Encounters:  06/30/16 (!) 175 kg (385 lb 14.4  oz)  06/23/16 (!) 198.2 kg (437 lb)  03/06/16 (!) 158.8 kg (350 lb)     Intake/Output Summary (Last 24 hours) at 06/30/16 1715 Last data filed at 06/30/16 1704  Gross per 24 hour  Intake              240 ml  Output             4200 ml  Net            -3960 ml    Physical Exam:   GENERAL: Pleasant-appearing in no apparent  distress.  HEAD, EYES, EARS, NOSE AND THROAT: Atraumatic, normocephalic. Extraocular muscles are intact. Pupils equal and reactive to light. Sclerae anicteric. No conjunctival injection. No oro-pharyngeal erythema.  NECK: Supple. There is no jugular venous distention. No bruits, no lymphadenopathy, no thyromegaly.  HEART: Regular rate and rhythm,. No murmurs, no rubs, no clicks.  LUNGS: Bilateral crackles at the bases. no  Accessory muscle usage ABDOMEN: Soft, flat, nontender, nondistended. Has good bowel sounds. No hepatosplenomegaly appreciated.  EXTREMITIES: No evidence of any cyanosis, clubbing, or peripheral edema.  +2 pedal and radial pulses bilaterally.  NEUROLOGIC: The patient is alert, awake, and oriented x3 with no focal motor or sensory deficits appreciated bilaterally.   SKIN: Moist and warm with no rashes appreciated.   Psych: Not anxious, depressed LN: No inguinal LN enlargement    Antibiotics   Anti-infectives    Start     Dose/Rate Route Frequency Ordered Stop   06/30/16 0000  amoxicillin-clavulanate (AUGMENTIN) 875-125 MG tablet  Status:  Discontinued     1 tablet Oral 2 times daily 06/30/16 1250 06/30/16    06/30/16 0000  amoxicillin-clavulanate (AUGMENTIN) 875-125 MG tablet     1 tablet Oral 2 times daily 06/30/16 1359     06/26/16 1800  azithromycin (ZITHROMAX) 250 mg in dextrose 5 % 125 mL IVPB  Status:  Discontinued     250 mg 125 mL/hr over 60 Minutes Intravenous Every 24 hours 06/25/16 1735 06/26/16 1605   06/26/16 1800  cefTRIAXone (ROCEPHIN) IVPB 1 g  Status:  Discontinued     1 g 100 mL/hr over 30 Minutes Intravenous Every 24  hours 06/25/16 2056 06/30/16 1630   06/26/16 1800  azithromycin (ZITHROMAX) tablet 250 mg  Status:  Discontinued     250 mg Oral Every 24 hours 06/26/16 1605 06/30/16 1630   06/25/16 1745  cefTRIAXone (ROCEPHIN) 1 g in dextrose 5 % 50 mL IVPB  Status:  Discontinued     1 g 100 mL/hr over 30 Minutes Intravenous Every 24 hours 06/25/16 1735 06/25/16 2055   06/25/16 1715  cefTRIAXone (ROCEPHIN) 1 g in dextrose 5 % 50 mL IVPB  Status:  Discontinued     1 g 100 mL/hr over 30 Minutes Intravenous  Once 06/25/16 1702 06/25/16 1704   06/25/16 1715  azithromycin (ZITHROMAX) 500 mg in dextrose 5 % 250 mL IVPB     500 mg 250 mL/hr over 60 Minutes Intravenous  Once 06/25/16 1702 06/25/16 1829   06/25/16 1715  cefTRIAXone (ROCEPHIN) IVPB 1 g     1 g 100 mL/hr over 30 Minutes Intravenous  Once 06/25/16 1704 06/25/16 1754      Medications   Scheduled Meds: . carvedilol  12.5 mg Oral BID WC  . enoxaparin (LOVENOX) injection  40 mg Subcutaneous Q12H  . furosemide  20 mg Intravenous Q8H  . insulin aspart  0-9 Units Subcutaneous TID WC  . ipratropium-albuterol  3 mL Nebulization Q6H  . lisinopril  2.5 mg Oral Daily  . metFORMIN  500 mg Oral BID WC  . neomycin-bacitracin-polymyxin   Topical BID  . predniSONE  50 mg Oral Q breakfast  . sodium chloride flush  3 mL Intravenous Q12H   Continuous Infusions: PRN Meds:.   Data Review:   Micro Results Recent Results (from the past 240 hour(s))  Rapid Influenza A&B Antigens ([facility] only)     Status: None   Collection Time: 06/25/16  4:55 PM  Result Value Ref Range Status   Influenza A [facility]) NEGATIVE  NEGATIVE Final   Influenza B ([facility]) NEGATIVE NEGATIVE Final  Culture, expectorated sputum-assessment     Status: None   Collection Time: 06/26/16 12:30 PM  Result Value Ref Range Status   Specimen Description SPUTUM  Final   Special Requests Normal  Final   Sputum evaluation THIS SPECIMEN IS ACCEPTABLE FOR SPUTUM CULTURE  Final   Report Status  06/26/2016 FINAL  Final  Culture, respiratory (NON-Expectorated)     Status: None   Collection Time: 06/26/16 12:30 PM  Result Value Ref Range Status   Specimen Description SPUTUM  Final   Special Requests Normal Reflexed from [id number]  Final   Gram Stain   Final    ABUNDANT WBC PRESENT,BOTH PMN AND MONONUCLEAR ABUNDANT GRAM POSITIVE COCCI IN PAIRS MODERATE GRAM NEGATIVE RODS RARE GRAM NEGATIVE COCCI IN PAIRS RARE GRAM POSITIVE RODS RARE YEAST    Culture   Final    Consistent with normal respiratory flora. Performed at [facility]    Report Status 06/29/2016 FINAL  Final    Radiology Reports Dg Chest 2 View  Result Date: 06/26/2016 CLINICAL DATA:  Acute CHF. EXAM: CHEST  2 VIEW COMPARISON:  CT chest January 3rd 2018 FINDINGS: Cardiac silhouette is moderately enlarged unchanged. Pulmonary vascular congestion mild interstitial prominence. Bandlike density LEFT midlung zone. Small pleural effusions. No pneumothorax. Large body habitus. Osseous structures are nonsuspicious. IMPRESSION: Stable cardiomegaly and findings of CHF with small pleural effusions. LEFT midlung zone atelectasis. Electronically Signed   By: Awilda Metro M.D.   On: 06/26/2016 04:44   Dg Chest 2 View  Result Date: 06/25/2016 CLINICAL DATA:  Shortness of breath, chest congestion cough, history of asthma. Current smoker. EXAM: CHEST  2 VIEW COMPARISON:  Chest x-ray of June 23, 2014 FINDINGS: The lungs are well-expanded. The interstitial markings remain increased bilaterally but have improved slightly since yesterday's study. The cardiac silhouette remains enlarged. The pulmonary vascularity is less engorged today. The trachea is midline. There is no pleural effusion. IMPRESSION: Mild interval improvement in the appearance of the pulmonary interstitium consistent with resolving CHF superimposed upon underlying reactive airway disease. Electronically Signed   By: David  [place] M.D.   On: 06/25/2016 16:07   Dg Chest  2 View  Result Date: 06/23/2016 CLINICAL DATA:  Shortness breath and cough for 2 days. EXAM: CHEST  2 VIEW COMPARISON:  CT chest March 06, 2016 FINDINGS: The mediastinal contour is normal. The heart size is enlarged. Bilateral interstitial edema is identified. There is no focal pneumonia or pleural effusion. The visualized skeletal structures are unremarkable. IMPRESSION: Congestive heart failure. Electronically Signed   By: Sherian Rein M.D.   On: 06/23/2016 18:49   Ct Angio Chest Pe W And/or Wo Contrast  Result Date: 06/25/2016 CLINICAL DATA:  Cough and shortness of breath over the last 2 days. Pulmonary edema. EXAM: CT ANGIOGRAPHY CHEST WITH CONTRAST TECHNIQUE: Multidetector CT imaging of the chest was performed using the standard protocol during bolus administration of intravenous contrast. Multiplanar CT image reconstructions and MIPs were obtained to evaluate the vascular anatomy. CONTRAST:  100 cc Isovue 370 COMPARISON:  Radiography same day.  CT 03/06/2016. FINDINGS: Cardiovascular: Pulmonary arterial opacification is good. There are no pulmonary emboli. No aortic atherosclerosis is seen. Coronary artery calcification is noted in the left system, premature for age. No pericardial fluid. Mediastinum/Nodes: Mildly prominent hilar and mediastinal nodes, probably reactive. Lungs/Pleura: Interstitial prominence that could go along with edema. There are small areas of airspace filling in both lungs that are  most consistent with widespread bronchopneumonia. These could be areas of airspace filling secondary to edema. There are small effusions layering dependently, right larger than left. Upper Abdomen: Normal Musculoskeletal: No significant finding. Review of the MIP images confirms the above findings. IMPRESSION: No pulmonary emboli. Interstitial edema pattern. Small effusions layering dependently right larger than left. Patchy areas of airspace filling bilaterally most consistent with bronchopneumonia.  The differential diagnosis would be early alveolar edema, but pneumonia is favored. Electronically Signed   By: Paulina Fusi M.D.   On: 06/25/2016 16:59     CBC  Recent Labs Lab 06/23/16 1956 06/25/16 1544 06/26/16 0123  WBC 11.0* 9.2 7.5  HGB 16.8 17.3 16.4  HCT 50.9 51.7 50.0  PLT 213 215 203  MCV 88.5 86.5 88.5  MCH 29.3 29.0 29.0  MCHC 33.1 33.5 32.8  RDW 14.5 14.6* 14.7*  LYMPHSABS 1.2  --   --   MONOABS 0.8  --   --   EOSABS 0.1  --   --   BASOSABS 0.1  --   --     Chemistries   Recent Labs Lab 06/25/16 1544 06/26/16 0123 06/27/16 1051 06/28/16 0610 06/30/16 1251  NA 137 136 142 141 137  K 4.0 3.6 4.6 3.9 4.3  CL 99* 97* 101 99* 96*  CO2 32 30 36* 34* 33*  GLUCOSE 124* 244* 225* 148* 202*  BUN 11 12 23* 27* 25*  CREATININE 0.80 0.73 0.92 0.65 0.87  CALCIUM 8.9 8.5* 8.4* 9.0 9.2   ------------------------------------------------------------------------------------------------------------------ estimated creatinine clearance is 188.8 mL/min (by C-G formula based on SCr of 0.87 mg/dL). ------------------------------------------------------------------------------------------------------------------ No results for input(s): HGBA1C in the last 72 hours. ------------------------------------------------------------------------------------------------------------------ No results for input(s): CHOL, HDL, LDLCALC, TRIG, CHOLHDL, LDLDIRECT in the last 72 hours. ------------------------------------------------------------------------------------------------------------------ No results for input(s): TSH, T4TOTAL, T3FREE, THYROIDAB in the last 72 hours.  Invalid input(s): FREET3 ------------------------------------------------------------------------------------------------------------------ No results for input(s): VITAMINB12, FOLATE, FERRITIN, TIBC, IRON, RETICCTPCT in the last 72 hours.  Coagulation profile No results for input(s): INR, PROTIME in the last 168  hours.  No results for input(s): DDIMER in the last 72 hours.  Cardiac Enzymes  Recent Labs Lab 06/25/16 1803 06/25/16 2128 06/26/16 0123  TROPONINI 0.06* 0.03* 0.03*   ------------------------------------------------------------------------------------------------------------------ Invalid input(s): POCBNP    Assessment & Plan   * Acute hypoxic respiratory failure ;Due to CHF and COPD exacerbation: On room air 91% saturation.   Acute COPD exacerbation-   clinically better no wheezing, wean down steroids  Acute on chronic systolic heart failure, dilated Cardiomyopathy with EF 25- 30%: Seen by cardiology, continue Coreg, Lasix, lisinopril, follow-up appointment at CHF clinic and discharge; Unable to discharge home today as he needs cpap at night,hopefully it will be arranged by am.     Community-acquired pneumonia- finished  6 days rocephin and zithromax.  *  Hypertension; controlled, continue beta blockers, Ace inhibitors, furosemide.    * Diabetes mellitus type 2:Diabetic teaching, started on low-sodium ADA diet, start metformin, discussed with the patient. Told him that he has diabetes.Patient needs diabetic educator teaching while in the hospital because this is a new diagnosis and also he has multiple other problems of heart failure, morbid obesity, essential hypertension. Explained to him that he needs annual eye check ups, also needs podiatry consult at discharge as an outpatient.  started metformin .diabetic teaching done, No need for insulin at home.   * Nicotine abuse smoking cessation provided by admitting md.  Marked obesity, hypoventilation possibly has sleep apnea needs sleep  studies as an outpatient. Also check overnight pulse oximetry.   Likely discharge tomorrow. D/w RN>    Code Status Orders        Start     Ordered   06/25/16 2105  Full code  Continuous     06/25/16 2104    Code Status History    Date Active Date Inactive Code Status Order ID  Comments User Context   This patient has a current code status but no historical code status.           Consults  none   DVT Prophylaxis  Lovenox   Lab Results  Component Value Date   PLT 203 06/26/2016     Time Spent in minutes    Greater than 50% of time spent in care coordination and counseling patient regarding the condition and plan of care.   Katha Hamming M.D on 06/30/2016 at 5:15 PM  Between 7am to 6pm - Pager - [phone]  After 6pm go to [email or web address] - password [email or web address] [facility]  [facility]   Office  [phone]

## 2016-06-30 NOTE — Progress Notes (Signed)
Inpatient Diabetes Program Recommendations  AACE/ADA: New Consensus Statement on Inpatient Glycemic Control (2015)  Target Ranges:  Prepandial:   less than 140 mg/dL      Peak postprandial:   less than 180 mg/dL (1-2 hours)      Critically ill patients:  140 - 180 mg/dL   Results for SEMISI, BIELA (MRN [id number]) as of 06/30/2016 11:22  Ref. Range 06/29/2016 07:20 06/29/2016 12:01 06/29/2016 21:04 06/30/2016 08:10  Glucose-Capillary Latest Ref Range: 65 - 99 mg/dL 956 (H) 213 (H) 086 (H) 145 (H)   Review of Glycemic Control  Diabetes history: No Outpatient Diabetes medications: NA Current orders for Inpatient glycemic control: Novolog 0-9 units TID with meals, Metformin 500 mg BID  Inpatient Diabetes Program Recommendations: HgbA1C: A1C 7.3% on 06/25/16 indicating an average glucose of 163 mg/dl over the past 2-3 months. Noted new DM dx. Recommend patient discharge on Metformin as currently ordered and follow up with doctor as an outpatient for DM control.  NOTE: Noted consult for new onset DM. Spoke with patient about new diabetes diagnosis. Patient reports that he has not been to a doctor in 10-15 years.  Discussed A1C results (7.3% on 06/25/16) and explained what an A1C is and informed patient that his current A1C indicates an average glucose of 163 mg/dl over the past 2-3 months. Discussed basic pathophysiology of DM Type 2, basic home care, importance of checking CBGs and maintaining good CBG control to prevent long-term and short-term complications. Reviewed glucose and A1C goals. Reviewed signs and symptoms of hyperglycemia and hypoglycemia along with treatment for both. Discussed impact of nutrition, exercise, stress, sickness, and medications on diabetes control. Explained to patient he has received steroids which are contributing to hyperglycemia and would expect glucose to further improve once steroids are completed. Patient states that in April 2017 he decided to cut back on sugar and decrease  carbohydrates. Patient reports that he lost over 60 lbs and 8 inches off waist. Encouraged patient to follow carb modified diet. Informed patient consult for RD has been ordered and he should see RD today for further diet education.  Reviewed Living Well with diabetes booklet and encouraged patient to read through entire book. Patient does not have insurance at this time.  Provided patient with handout information on Reli-On products and encouraged patient to go to [employer] to get the Reli-On Prime glucometer for $9 and a box of 50 Reli-On test strips for $9. Informed patient he could purchase Metformin at [facility] for $4 for 30 day supply.  Asked patient to check his glucose as ordered by MD at time of discharge. Asked that he keep a log book of glucose readings and encouraged patient to take the log book to follow up appointment. Inquired about plan for establishing care with doctor and patient reports he does not know yet where he will follow up. Noted patient has application for [facility] at bedside that has not been completed. Encouraged patient to fill out application so it could be faxed over to determine if patient could qualify for medication assistance and follow up at [facility]. Stressed to patient he will need to establish care with MD for follow up and DM management.   Patient verbalized understanding of information discussed and he states that he has no further questions at this time related to diabetes.   RNs to provide ongoing basic DM education at bedside with this patient and engage patient to actively check blood glucose.   Thanks,  Orlando Penner, RN, MSN, CDE Diabetes Coordinator Inpatient Diabetes Program [phone] (Team Pager from 8am to 5pm)

## 2016-06-30 NOTE — Progress Notes (Signed)
Patient with 15 beat run of V-tach.  Patient asymptomatic with V-tach run.  Dr. Cassie Freer notified and verbal orders obtained to increase dose of coreg.

## 2016-06-30 NOTE — Progress Notes (Signed)
Patient had 2.13 seconds pause this morning. Patient is asymptomatic. Dr. Tobi Bastos  notified and he indicated that we should continue to monitor but in case it becomes request, then we should let cardiologist see the pt. Will continue to monitor.

## 2016-06-30 NOTE — Plan of Care (Signed)
Problem: Food- and Nutrition-Related Knowledge Deficit (NB-1.1) Goal: Nutrition education Formal process to instruct or train a patient/client in a skill or to impart knowledge to help patients/clients voluntarily manage or modify food choices and eating behavior to maintain or improve health. Outcome: Completed/Met Date Met: 06/30/16  RD consulted for nutrition education regarding diabetes.   Lab Results  Component Value Date   HGBA1C 7.3 (H) 06/25/2016    RD provided "Carbohydrate Counting for People with Diabetes" handout from the Academy of Nutrition and Dietetics. Discussed different food groups and their effects on blood sugar, emphasizing carbohydrate-containing foods. Provided list of carbohydrates and recommended serving sizes of common foods.  Discussed importance of controlled and consistent carbohydrate intake throughout the day. Provided examples of ways to balance meals/snacks and encouraged intake of high-fiber, whole grain complex carbohydrates. Teach back method used.  Expect fair compliance.  Body mass index is 48.23 kg/m. Pt meets criteria for obese class III based on current BMI.  Current diet order is carb modified, patient is consuming approximately 100% of meals at this time. Labs and medications reviewed. No further nutrition interventions warranted at this time. RD contact information provided. If additional nutrition issues arise, please re-consult RD.  Marc Wright. Marc Gunner, MS, RD LDN Inpatient Clinical Dietitian Pager [phone]

## 2016-06-30 NOTE — Care Management (Signed)
Patient have overnight oximetry last pm and he desatted 407 times for a total of 56 mins total on room air.  Patient needs to discharge home with a cpap machine.  Attending states this is due to sleep apnea. Discussed that patient does not have insurance.  [employer] does not cover cpap machines and it is stated this treatment is necessary to meet patient's needs rather than nocturnal 02.  Cpap will be ordered tonight.  Have requested assistance from the foundation for one month.  [employer] provided [employer] CPAP Assistance Program application.  It would take approximately one week to obtain the machine after the application is completed. to defray the cost of shipping and handling, the beneficiary is required to make a 100 dollar contribution.  Explained to patient and he is in agreement with plan.  [facility].  Patient has not completed the application.  Clarified with attending that it is not intended for patient to dicsharge home on insulin.

## 2016-07-01 LAB — GLUCOSE, CAPILLARY
Glucose-Capillary: 114 mg/dL — ABNORMAL HIGH (ref 65–99)
Glucose-Capillary: 156 mg/dL — ABNORMAL HIGH (ref 65–99)

## 2016-07-01 LAB — MAGNESIUM: Magnesium: 2 mg/dL (ref 1.7–2.4)

## 2016-07-01 NOTE — Progress Notes (Signed)
Discharge Instructions reviewed with patient. Patient verbalized understanding. Medications previously filled and were given to patient prior to discharge. Vital signs stable. IV dc'ed- catheter intact. No signs or symptoms of infection noted at this time. Patient states that transportation home is picking patient up in the lobby. Patient being escorted to via wheelchair by Nurse Tech, belongings with patient.

## 2016-07-01 NOTE — Discharge Summary (Signed)
Marc Wright, is a 41 y.o. male  DOB January 20, 1976  MRN [id number].  Admission date:  06/25/2016  Admitting Physician  Altamese Dilling, MD  Discharge Date:  07/01/2016   Primary MD  No PCP Per Patient  Recommendations for primary care physician for things to follow:   No clinic for medication management, follow-up with Dr. Desma Maxim to establish PCP. Follow-up at CHF clinic.   Admission Diagnosis  COPD exacerbation (HCC) [J44.1] Acute CHF (HCC) [I50.9] Acute respiratory failure with hypoxia (HCC) [J96.01] Acute on chronic congestive heart failure, unspecified congestive heart failure type (HCC) [I50.9] Community acquired pneumonia, unspecified laterality [J18.9]   Discharge Diagnosis  COPD exacerbation (HCC) [J44.1] Acute CHF (HCC) [I50.9] Acute respiratory failure with hypoxia (HCC) [J96.01] Acute on chronic congestive heart failure, unspecified congestive heart failure type (HCC) [I50.9] Community acquired pneumonia, unspecified laterality [J18.9]   Active Problems:   Acute CHF (HCC)      Past Medical History:  Diagnosis Date  . Asthma     History reviewed. No pertinent surgical history.     History of present illness and  Hospital Course:     Kindly see H&P for history of present illness and admission details, please review complete Labs, Consult reports and Test reports for all details in brief  HPI  from the history and physical done on the day of admission 41 year old morbidly obese male admitted on January 3 because of shortness of breath, worsening Pedal edema. Admitted for COPD exacerbation,/bronchopneumonia/ new onset congestive heart failure.   Hospital Course   Acute respiratory failure with hypoxia secondary to COPD exacerbation, CHF exacerbation, community-acquired pneumonia: Received  IV steroids, nebulizers, IV Rocephin, Zithromax.Cultures did not show any growth. And continued on oxygen. His IV Rocephin, Zithromax, discharge home with Augmentin. Did not have any wheezing at the time of discharge also his shortness of breath improved. Advised to quit smoking. We'll also advised him to lose weight and make healthy lifestyle choices.   #2 CHF exacerbation: Patient has no prior history of heart failure. Seen by cardiology, patient's weight on admission more than 400 pounds. On IV diuretics, patient weight decreased to 175 pounds today. Patient symptoms also improved, seen by cardiology, patient's echocardiogram showed EF 25-30% with dilated cardiomyopathy. Patient started on Coreg, ACe inhibitors, discharged home with Lasix also. Follow-up with Dr. Juliann Pares  as an outpatient for scheduling cardiac catheter versus Lexiscan Myoview.   #3 morbid obesity, sleep apnea: Patient had multiple desaturations on overnight pulse oximetry. Arranged for CPAP for him to go home with with help of case manager, foundation funds.  #4 diabetes mellitus type 2: Hemoglobin A1c 7.3. No new diagnosis of diabetes mellitus. Discharge home with metformin, patient advised about diet choices, diabetic nurse followed the patient. Did not discharge him with insulin but advised to check glucose at least once a day and keep the log of blood sugars and follow up with  [facility] . Patient does not have insurance at this time.  Provided patient with handout information on Reli-On products and encouraged patient to go to [facility] to get the Reli-On Prime glucometer for $9 and a box of 50 Reli-On test strips for $9. Informed patient he could purchase Metformin at [facility] for $4 for 30 day supply.  Discharge Condition: stable.   Follow UP  Follow-up Information    Delma Freeze, FNP. Go on 07/10/2016.   Specialty:  Family Medicine Why:  at 9:00am to the [facility] Contact information: [address] [facility]  [address] [place] [address] [phone]        Luna Fuse, MD Follow up.   Specialty:  Internal Medicine Why:  Please call to make an appointment with Desma Maxim at this office, [phone], ccs Contact information: [address] [place] [address] [phone]        [facility]. Schedule an appointment as soon as possible for a visit.   Why:  Make appointment to complete application  [phone]       [facility]. Schedule an appointment as soon as possible for a visit.   Specialty:  Primary Care Why:  complete application process and make eligibility appt to see if would qualify for services. Contact information: [address] [place] [address] [phone]       [facility] Follow up.   Why:  For the Cpap machine to be delivered this evening to your [facility] information: [address] [place] [address] [phone]             Discharge Instructions  and  Discharge Medications     Discharge Instructions    For home use only DME oxygen    Complete by:  As directed    Mode or (Route):  Nasal cannula   Frequency:  Continuous (stationary and portable oxygen unit needed)   Oxygen conserving device:  Yes   Oxygen delivery system:  Gas     Allergies as of 07/01/2016   No Known Allergies     Medication List    TAKE these medications   amoxicillin-clavulanate 875-125 MG tablet Commonly known as:  AUGMENTIN Take 1 tablet by mouth 2 (two) times daily.   B-complex with vitamin C tablet Take 1 tablet by mouth daily.   carvedilol 12.5 MG tablet Commonly known as:  COREG Take 1 tablet (12.5 mg total) by mouth 2 (two) times daily with a meal.   furosemide 40 MG tablet Commonly known as:  LASIX Take 1 tablet (40 mg total) by mouth 2 (two) times daily. What changed:  when to take this   lisinopril  2.5 MG tablet Commonly known as:  PRINIVIL,ZESTRIL Take 1 tablet (2.5 mg total) by mouth daily.   metFORMIN 500 MG tablet Commonly known as:  GLUCOPHAGE Take 1 tablet (500 mg total) by mouth 2 (two) times daily with a meal.   multivitamin Tabs tablet Take 1 tablet by mouth daily.   predniSONE 10 MG (21) Tbpk tablet Commonly known as:  STERAPRED UNI-PAK 21 TAB Take 1 tablet (10 mg total) by mouth daily. Take as prescribed   vitamin C 500 MG tablet Commonly known as:  ASCORBIC ACID Take 500 mg by mouth daily.            Durable Medical Equipment        Start     Ordered   07/01/16 1214  For home use only DME continuous positive airway pressure (CPAP)  Once    Comments:  Auto cpap;range'max;20 and min 5  Question Answer Comment  Patient has OSA or probable OSA Yes   Is the patient currently using CPAP in the home Yes   Date of face to face encounter 07/01/2016   Settings Other see comments   Signs and symptoms of probable OSA  (select all that apply) Witnessed apneas   Signs and symptoms of probable OSA  (select all that  apply) Snoring   CPAP supplies needed Mask, headgear, cushions, filters, heated tubing and water chamber      07/01/16 1214   06/30/16 0000  For home use only DME oxygen    Question Answer Comment  Mode or (Route) Nasal cannula   Frequency Continuous (stationary and portable oxygen unit needed)   Oxygen conserving device Yes   Oxygen delivery system Gas      06/30/16 1300        Diet and Activity recommendation: See Discharge Instructions above   Consults obtained -Cardiology, diabetic nurse, case manager   Major procedures and Radiology Reports - PLEASE review detailed and final reports for all details, in brief -      Dg Chest 2 View  Result Date: 06/26/2016 CLINICAL DATA:  Acute CHF. EXAM: CHEST  2 VIEW COMPARISON:  CT chest January 3rd 2018 FINDINGS: Cardiac silhouette is moderately enlarged unchanged. Pulmonary vascular congestion  mild interstitial prominence. Bandlike density LEFT midlung zone. Small pleural effusions. No pneumothorax. Large body habitus. Osseous structures are nonsuspicious. IMPRESSION: Stable cardiomegaly and findings of CHF with small pleural effusions. LEFT midlung zone atelectasis. Electronically Signed   By: Awilda Metro M.D.   On: 06/26/2016 04:44   Dg Chest 2 View  Result Date: 06/25/2016 CLINICAL DATA:  Shortness of breath, chest congestion cough, history of asthma. Current smoker. EXAM: CHEST  2 VIEW COMPARISON:  Chest x-ray of June 23, 2014 FINDINGS: The lungs are well-expanded. The interstitial markings remain increased bilaterally but have improved slightly since yesterday's study. The cardiac silhouette remains enlarged. The pulmonary vascularity is less engorged today. The trachea is midline. There is no pleural effusion. IMPRESSION: Mild interval improvement in the appearance of the pulmonary interstitium consistent with resolving CHF superimposed upon underlying reactive airway disease. Electronically Signed   By: David  [place] M.D.   On: 06/25/2016 16:07   Dg Chest 2 View  Result Date: 06/23/2016 CLINICAL DATA:  Shortness breath and cough for 2 days. EXAM: CHEST  2 VIEW COMPARISON:  CT chest March 06, 2016 FINDINGS: The mediastinal contour is normal. The heart size is enlarged. Bilateral interstitial edema is identified. There is no focal pneumonia or pleural effusion. The visualized skeletal structures are unremarkable. IMPRESSION: Congestive heart failure. Electronically Signed   By: Sherian Rein M.D.   On: 06/23/2016 18:49   Ct Angio Chest Pe W And/or Wo Contrast  Result Date: 06/25/2016 CLINICAL DATA:  Cough and shortness of breath over the last 2 days. Pulmonary edema. EXAM: CT ANGIOGRAPHY CHEST WITH CONTRAST TECHNIQUE: Multidetector CT imaging of the chest was performed using the standard protocol during bolus administration of intravenous contrast. Multiplanar CT image  reconstructions and MIPs were obtained to evaluate the vascular anatomy. CONTRAST:  100 cc Isovue 370 COMPARISON:  Radiography same day.  CT 03/06/2016. FINDINGS: Cardiovascular: Pulmonary arterial opacification is good. There are no pulmonary emboli. No aortic atherosclerosis is seen. Coronary artery calcification is noted in the left system, premature for age. No pericardial fluid. Mediastinum/Nodes: Mildly prominent hilar and mediastinal nodes, probably reactive. Lungs/Pleura: Interstitial prominence that could go along with edema. There are small areas of airspace filling in both lungs that are most consistent with widespread bronchopneumonia. These could be areas of airspace filling secondary to edema. There are small effusions layering dependently, right larger than left. Upper Abdomen: Normal Musculoskeletal: No significant finding. Review of the MIP images confirms the above findings. IMPRESSION: No pulmonary emboli. Interstitial edema pattern. Small effusions layering dependently right larger than left.  Patchy areas of airspace filling bilaterally most consistent with bronchopneumonia. The differential diagnosis would be early alveolar edema, but pneumonia is favored. Electronically Signed   By: Paulina Fusi M.D.   On: 06/25/2016 16:59    Micro Results     Recent Results (from the past 240 hour(s))  Rapid Influenza A&B Antigens ([facility] only)     Status: None   Collection Time: 06/25/16  4:55 PM  Result Value Ref Range Status   Influenza A ([facility]) NEGATIVE NEGATIVE Final   Influenza B ([facility]) NEGATIVE NEGATIVE Final  Culture, expectorated sputum-assessment     Status: None   Collection Time: 06/26/16 12:30 PM  Result Value Ref Range Status   Specimen Description SPUTUM  Final   Special Requests Normal  Final   Sputum evaluation THIS SPECIMEN IS ACCEPTABLE FOR SPUTUM CULTURE  Final   Report Status 06/26/2016 FINAL  Final  Culture, respiratory (NON-Expectorated)     Status: None   Collection  Time: 06/26/16 12:30 PM  Result Value Ref Range Status   Specimen Description SPUTUM  Final   Special Requests Normal Reflexed from [id number]  Final   Gram Stain   Final    ABUNDANT WBC PRESENT,BOTH PMN AND MONONUCLEAR ABUNDANT GRAM POSITIVE COCCI IN PAIRS MODERATE GRAM NEGATIVE RODS RARE GRAM NEGATIVE COCCI IN PAIRS RARE GRAM POSITIVE RODS RARE YEAST    Culture   Final    Consistent with normal respiratory flora. Performed at [facility]    Report Status 06/29/2016 FINAL  Final       Today   Subjective:   Marc Wright today has no headache,no chest abdominal pain,no new weakness tingling or numbness, feels much better wants to go home today.  Objective:   Blood pressure 129/75, pulse 72, temperature 97.8 F (36.6 C), temperature source Oral, resp. rate 20, height 6\' 3"  (1.905 m), weight (!) 171.2 kg (377 lb 8 oz), SpO2 94 %.   Intake/Output Summary (Last 24 hours) at 07/01/16 1752 Last data filed at 07/01/16 1141  Gross per 24 hour  Intake             1875 ml  Output             1650 ml  Net              225 ml    Exam Awake Alert, Oriented x 3, No new F.N deficits, Normal affect [place].AT,PERRAL Supple Neck,No JVD, No cervical lymphadenopathy appriciated.  Symmetrical Chest wall movement, Good air movement bilaterally, CTAB RRR,No Gallops,Rubs or new Murmurs, No Parasternal Heave +ve B.Sounds, Abd Soft, Non tender, No organomegaly appriciated, No rebound -guarding or rigidity. No Cyanosis, Clubbing or edema, No new Rash or bruise  Data Review   CBC w Diff: Lab Results  Component Value Date   WBC 7.5 06/26/2016   HGB 16.4 06/26/2016   HCT 50.0 06/26/2016   PLT 203 06/26/2016   LYMPHOPCT 11 06/23/2016   MONOPCT 7 06/23/2016   EOSPCT 1 06/23/2016   BASOPCT 1 06/23/2016    CMP: Lab Results  Component Value Date   NA 137 06/30/2016   K 4.3 06/30/2016   CL 96 (L) 06/30/2016   CO2 33 (H) 06/30/2016   BUN 25 (H) 06/30/2016   CREATININE 0.87 06/30/2016    PROT 6.2 (L) 03/06/2016   ALBUMIN 3.7 03/06/2016   BILITOT 0.7 03/06/2016   ALKPHOS 103 03/06/2016   AST 20 03/06/2016   ALT 18 03/06/2016  .   Total  Time in preparing paper work, data evaluation and todays exam - 35 minutes  Marc Wright M.D on 07/01/2016 at 5:52 PM    Note: This dictation was prepared with Dragon dictation along with smaller phrase technology. Any transcriptional errors that result from this process are unintentional.

## 2016-07-01 NOTE — Care Management (Addendum)
Patient approved for foundation funds for 2 months of cpap (190.00).  ( Hello Marc Wright, I received your request in the  Care Manager [facility] Request system. Yes, we can assist with cpap. Will you be submitting an invoice for this? Thank you.  Marc Wright  [facility] )   Patient has completed the CPAP Assistance application and will mail it himself with a money order.  Faxed the initial drafts of his applications to [facility] with patient verbalizing that he will have to complete them both by providing the additional documentation to verify income, county of residence and ID.  [facility] will deliver the cpap machine to patient's home tonight.  he has the tubing and mask.  He verbalizes understanding to contact dr Alford [facility] (or office of his choice) to establish PCP if he does not pursue [facility].  Updated primary nurse that patient's discharge meds obtained from [facility] yesterday are in the drug room.  Instructed patient not to leave without his meds.

## 2016-07-10 ENCOUNTER — Ambulatory Visit: Payer: Self-pay | Admitting: Family

## 2016-07-22 ENCOUNTER — Encounter: Payer: Self-pay | Admitting: Family

## 2016-07-22 ENCOUNTER — Ambulatory Visit: Payer: Self-pay | Attending: Family | Admitting: Family

## 2016-07-22 DIAGNOSIS — E669 Obesity, unspecified: Secondary | ICD-10-CM | POA: Insufficient documentation

## 2016-07-22 DIAGNOSIS — F17210 Nicotine dependence, cigarettes, uncomplicated: Secondary | ICD-10-CM | POA: Insufficient documentation

## 2016-07-22 DIAGNOSIS — Z720 Tobacco use: Secondary | ICD-10-CM

## 2016-07-22 DIAGNOSIS — Z8249 Family history of ischemic heart disease and other diseases of the circulatory system: Secondary | ICD-10-CM | POA: Insufficient documentation

## 2016-07-22 DIAGNOSIS — I110 Hypertensive heart disease with heart failure: Secondary | ICD-10-CM | POA: Insufficient documentation

## 2016-07-22 DIAGNOSIS — I5022 Chronic systolic (congestive) heart failure: Secondary | ICD-10-CM | POA: Insufficient documentation

## 2016-07-22 DIAGNOSIS — G4733 Obstructive sleep apnea (adult) (pediatric): Secondary | ICD-10-CM | POA: Insufficient documentation

## 2016-07-22 DIAGNOSIS — J45909 Unspecified asthma, uncomplicated: Secondary | ICD-10-CM | POA: Insufficient documentation

## 2016-07-22 DIAGNOSIS — I10 Essential (primary) hypertension: Secondary | ICD-10-CM

## 2016-07-22 DIAGNOSIS — Z6841 Body Mass Index (BMI) 40.0 and over, adult: Secondary | ICD-10-CM | POA: Insufficient documentation

## 2016-07-22 DIAGNOSIS — E119 Type 2 diabetes mellitus without complications: Secondary | ICD-10-CM | POA: Insufficient documentation

## 2016-07-22 DIAGNOSIS — Z7984 Long term (current) use of oral hypoglycemic drugs: Secondary | ICD-10-CM | POA: Insufficient documentation

## 2016-07-22 MED ORDER — METFORMIN HCL 500 MG PO TABS: 500.0000 mg | ORAL_TABLET | Freq: Two times a day (BID) | ORAL | 0 refills | Status: DC

## 2016-07-22 MED ORDER — FUROSEMIDE 40 MG PO TABS: 40.0000 mg | ORAL_TABLET | Freq: Two times a day (BID) | ORAL | 0 refills | Status: DC

## 2016-07-22 MED ORDER — LISINOPRIL 2.5 MG PO TABS: 2.5000 mg | ORAL_TABLET | Freq: Every day | ORAL | 0 refills | Status: DC

## 2016-07-22 MED ORDER — CARVEDILOL 12.5 MG PO TABS: 12.5000 mg | ORAL_TABLET | Freq: Two times a day (BID) | ORAL | 0 refills | Status: DC

## 2016-07-22 NOTE — Patient Instructions (Addendum)
Resume weighing daily and call for an overnight weight gain of > 2 pounds or a weekly weight gain of >5 pounds.  [facility]    [address] [place] [address] [phone]  Tuesday 4:15-7:30pm Wednesday 9 Am-1 PM Thursday 1-7:30PM

## 2016-07-22 NOTE — Progress Notes (Signed)
Patient ID: Marc Wright, male    DOB: 11-Sep-1975, 41 y.o.   MRN: [id number]  HPI  Mr Feick is a 41 y/o male with a history of obstructive sleep apnea (with CPAP), DM, asthma, obesity, tobacco use and chronic heart failure.   Last echo was done 06/26/16 and showed an EF of 25-30% with mild MR/TR.   Admitted on 06/25/16 with acute heart failure & pneumonia. He was treated with  IV steroids, nebulizers and antibiotics. Was given oxygen short-term. Cardiology consult was obtained. Was also given IV diuretics. Was in the ED on 06/23/16 with shortness of breath, was treated and released.   He presents today for his initial visit with fatigue mostly at the end of his work day. Denies any shortness of breath with exertion and rest. Does endorse swelling in his lower legs especially when he falls asleep in the chair and doesn't get his feet elevated. Wearing his CPAP on a nightly basis. Has recently joined a gym. 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   Past Medical History:  Diagnosis Date  . Asthma   . CHF (congestive heart failure) (HCC)   . Diabetes mellitus without complication (HCC)   . OSA on CPAP   . Pneumonia 06/2016   Past Surgical History:  Procedure Laterality Date  . I&D EXTREMITY    . SKIN GRAFT     Family History  Problem Relation Age of Onset  . Congestive Heart Failure Mother   . Hypertension Mother    Social History  Substance Use Topics  . Smoking status: Current Every Day Smoker    Packs/day: 0.75    Types: Cigarettes  . Smokeless tobacco: Never Used  . Alcohol use Yes     Comment: occasional    No Known Allergies   Prior to Admission medications   Medication Sig Start Date End Date Taking? Authorizing Provider  B Complex-C (B-COMPLEX WITH VITAMIN C) tablet Take 1 tablet by mouth daily.   Yes Historical Provider, MD  carvedilol (COREG) 12.5 MG tablet Take 1 tablet (12.5 mg total) by mouth 2 (two) times daily with a meal. 07/22/16  Yes Delma Freeze, FNP  furosemide  (LASIX) 40 MG tablet Take 1 tablet (40 mg total) by mouth 2 (two) times daily. 07/22/16  Yes Delma Freeze, FNP  lisinopril (PRINIVIL,ZESTRIL) 2.5 MG tablet Take 1 tablet (2.5 mg total) by mouth daily. 07/22/16  Yes Delma Freeze, FNP  metFORMIN (GLUCOPHAGE) 500 MG tablet Take 1 tablet (500 mg total) by mouth 2 (two) times daily with a meal. 07/22/16  Yes Delma Freeze, FNP  multivitamin (ONE-A-DAY MEN'S) TABS tablet Take 1 tablet by mouth daily.   Yes Historical Provider, MD  vitamin C (ASCORBIC ACID) 500 MG tablet Take 500 mg by mouth daily.   Yes Historical Provider, MD     Review of Systems  Constitutional: Positive for fatigue. Negative for appetite change.  HENT: Positive for congestion. Negative for rhinorrhea and sore throat.   Eyes: Negative.   Respiratory: Negative for cough, chest tightness and shortness of breath.   Cardiovascular: Positive for leg swelling (better with elevation). Negative for chest pain and palpitations.  Gastrointestinal: Negative for abdominal distention and abdominal pain.  Endocrine: Negative.   Genitourinary: Negative.   Musculoskeletal: Negative for back pain and neck pain.  Skin: Negative.   Allergic/Immunologic: Negative.   Neurological: Positive for light-headedness (after taking medications). Negative for dizziness.  Hematological: Negative for adenopathy. Does not bruise/bleed easily.  Psychiatric/Behavioral: Negative for dysphoric mood, sleep disturbance (wearing CPAP nightly) and suicidal ideas. The patient is not nervous/anxious.    Vitals:   07/22/16 1057  BP: (!) 142/100  Pulse: 94  Resp: 18  SpO2: 97%  Weight: (!) 401 lb 6 oz (182.1 kg)  Height: 6\' 3"  (1.905 m)   Wt Readings from Last 3 Encounters:  07/22/16 (!) 401 lb 6 oz (182.1 kg)  07/01/16 (!) 377 lb 8 oz (171.2 kg)  06/23/16 (!) 437 lb (198.2 kg)   Lab Results  Component Value Date   CREATININE 0.87 06/30/2016   CREATININE 0.65 06/28/2016   CREATININE 0.92 06/27/2016     Physical Exam  Constitutional: He is oriented to person, place, and time. He appears well-developed and well-nourished.  HENT:  Head: Normocephalic and atraumatic.  Eyes: Conjunctivae are normal. Pupils are equal, round, and reactive to light.  Neck: Normal range of motion. Neck supple. No JVD present.  Cardiovascular: Regular rhythm.  Tachycardia present.   Pulmonary/Chest: Effort normal. He has no wheezes. He has no rales.  Abdominal: Soft. He exhibits no distension. There is no tenderness.  Musculoskeletal: He exhibits edema (2+ pitting edema in bilateral lower legs). He exhibits no tenderness.  Neurological: He is alert and oriented to person, place, and time.  Skin: Skin is warm and dry.  Psychiatric: He has a normal mood and affect. His behavior is normal. Thought content normal.  Nursing note and vitals reviewed.  Assessment & Plan:  1: Chronic heart failure with reduced ejection fraction- - NYHA class II - mildly fluid overloaded today with pedal edema - has not been weighing daily and he was encouraged to begin weighing daily so that he can call for an overnight weight gain of >2 pounds or a weekly weight gain of >5 pounds. May need additional diuretic.  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 Knows that his weight has gone up since hospital discharge - not adding salt to his food at home but does cook with it at work and, again, he's sampling the food every time he is at work cooking. Importance of closely following a 2000mg  sodium diet was reviewed with him and written information was given to him about this. - discussed changing his lisinopril to entresto at next visit  2: HTN- - BP elevated but weight is up from discharge - he's recently joined a gym and has been going 2-3 times a week - does not have a PCP as he doesn't have any insurance so information on [facility] given to him - encouraged him to make  sure he wears his CPAP every night  3: Obstructive sleep apnea- - wearing CPAP nightly except when he falls asleep downstairs in the chair. He does say that on those nights he doesn't wear his CPAP that he feels more tired and notices that his edema is worse  4: Tobacco use- - is now using vape instead of cigarettes - complete cessation of that discussed for 3 minutes with him.   Medication bottles were reviewed.  Return here in 1 month or sooner for any questions/problems before then.

## 2016-07-23 DIAGNOSIS — Z720 Tobacco use: Secondary | ICD-10-CM | POA: Insufficient documentation

## 2016-07-23 DIAGNOSIS — I10 Essential (primary) hypertension: Secondary | ICD-10-CM | POA: Insufficient documentation

## 2016-07-23 DIAGNOSIS — I5022 Chronic systolic (congestive) heart failure: Secondary | ICD-10-CM | POA: Insufficient documentation

## 2016-07-23 DIAGNOSIS — E119 Type 2 diabetes mellitus without complications: Secondary | ICD-10-CM | POA: Insufficient documentation

## 2016-07-23 DIAGNOSIS — G4733 Obstructive sleep apnea (adult) (pediatric): Secondary | ICD-10-CM | POA: Insufficient documentation

## 2016-07-30 ENCOUNTER — Ambulatory Visit: Payer: Self-pay | Admitting: Pharmacy Technician

## 2016-07-30 DIAGNOSIS — Z79899 Other long term (current) drug therapy: Secondary | ICD-10-CM

## 2016-07-30 NOTE — Progress Notes (Signed)
Met with patient completed financial assistance application for [place] due to recent hospital visit.  Patient agreed to be responsible for gathering financial information and forwarding to appropriate department in [facility].    Completed [facility] application and contract.  Patient agreed to all terms of the [facility] contract.  Patient to provide 2017 taxes.  Provided patient with Civil engineer, contracting based on his particular needs.    Patient currently seeing [facility].  Does not want to see a provider at [facility].  Wants to continue to see Darylene Price since he has CHF.  [facility]

## 2016-08-20 ENCOUNTER — Ambulatory Visit: Payer: Self-pay | Attending: Family | Admitting: Family

## 2016-08-20 ENCOUNTER — Encounter: Payer: Self-pay | Admitting: Family

## 2016-08-20 VITALS — BP 124/70 | HR 77 | Resp 18 | Ht 75.0 in | Wt >= 6400 oz

## 2016-08-20 DIAGNOSIS — Z720 Tobacco use: Secondary | ICD-10-CM

## 2016-08-20 DIAGNOSIS — Z8249 Family history of ischemic heart disease and other diseases of the circulatory system: Secondary | ICD-10-CM | POA: Insufficient documentation

## 2016-08-20 DIAGNOSIS — Z7984 Long term (current) use of oral hypoglycemic drugs: Secondary | ICD-10-CM | POA: Insufficient documentation

## 2016-08-20 DIAGNOSIS — J45909 Unspecified asthma, uncomplicated: Secondary | ICD-10-CM | POA: Insufficient documentation

## 2016-08-20 DIAGNOSIS — Z6841 Body Mass Index (BMI) 40.0 and over, adult: Secondary | ICD-10-CM | POA: Insufficient documentation

## 2016-08-20 DIAGNOSIS — F17210 Nicotine dependence, cigarettes, uncomplicated: Secondary | ICD-10-CM | POA: Insufficient documentation

## 2016-08-20 DIAGNOSIS — I110 Hypertensive heart disease with heart failure: Secondary | ICD-10-CM | POA: Insufficient documentation

## 2016-08-20 DIAGNOSIS — E119 Type 2 diabetes mellitus without complications: Secondary | ICD-10-CM | POA: Insufficient documentation

## 2016-08-20 DIAGNOSIS — G4733 Obstructive sleep apnea (adult) (pediatric): Secondary | ICD-10-CM | POA: Insufficient documentation

## 2016-08-20 DIAGNOSIS — I5022 Chronic systolic (congestive) heart failure: Secondary | ICD-10-CM | POA: Insufficient documentation

## 2016-08-20 DIAGNOSIS — I10 Essential (primary) hypertension: Secondary | ICD-10-CM

## 2016-08-20 DIAGNOSIS — E669 Obesity, unspecified: Secondary | ICD-10-CM | POA: Insufficient documentation

## 2016-08-20 MED ORDER — CARVEDILOL 12.5 MG PO TABS: 12.5000 mg | ORAL_TABLET | Freq: Two times a day (BID) | ORAL | 5 refills | Status: DC

## 2016-08-20 MED ORDER — FUROSEMIDE 40 MG PO TABS: 40.0000 mg | ORAL_TABLET | Freq: Two times a day (BID) | ORAL | 5 refills | Status: DC

## 2016-08-20 MED ORDER — SACUBITRIL-VALSARTAN 24-26 MG PO TABS: 1.0000 | ORAL_TABLET | Freq: Two times a day (BID) | ORAL | 5 refills | Status: DC

## 2016-08-20 MED ORDER — METFORMIN HCL 500 MG PO TABS: 500.0000 mg | ORAL_TABLET | Freq: Two times a day (BID) | ORAL | 5 refills | Status: DC

## 2016-08-20 MED ORDER — METOLAZONE 5 MG PO TABS: 5.0000 mg | ORAL_TABLET | Freq: Every day | ORAL | 0 refills | Status: DC

## 2016-08-20 NOTE — Progress Notes (Signed)
Patient ID: Marc Wright, male    DOB: [birth year], 41 y.o.   MRN: [id number]  HPI  Marc Wright is a 41 y/o male with a history of obstructive sleep apnea (with CPAP), DM, asthma, obesity, tobacco use and chronic heart failure.   Last echo was done 06/26/16 and showed an EF of 25-30% with mild Marc/TR.   Admitted on 06/25/16 with acute heart failure & pneumonia. He was treated with  IV steroids, nebulizers and antibiotics. Was given oxygen short-term. Cardiology consult was obtained. Was also given IV diuretics. Was in the ED on 06/23/16 with shortness of breath, was treated and released.   He presents today for his follow-up visit with fatigue mostly at the end of his work day. Denies any shortness of breath with exertion or rest. Does endorse swelling in his lower legs especially when he falls asleep in the chair and doesn't get his feet elevated. Wearing his CPAP on a nightly basis. Continues to go to the gym. 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   Past Medical History:  Diagnosis Date  . Asthma   . CHF (congestive heart failure) (HCC)   . Diabetes mellitus without complication (HCC)   . OSA on CPAP   . Pneumonia 06/2016   Past Surgical History:  Procedure Laterality Date  . I&D EXTREMITY    . SKIN GRAFT     Family History  Problem Relation Age of Onset  . Congestive Heart Failure Mother   . Hypertension Mother    Social History  Substance Use Topics  . Smoking status: Current Every Day Smoker    Packs/day: 0.75    Types: Cigarettes  . Smokeless tobacco: Never Used  . Alcohol use Yes     Comment: occasional    No Known Allergies  Prior to Admission medications   Medication Sig Start Date End Date Taking? Authorizing Provider  B Complex-C (B-COMPLEX WITH VITAMIN C) tablet Take 1 tablet by mouth daily.   Yes Historical Provider, MD  carvedilol (COREG) 12.5 MG tablet Take 1 tablet (12.5 mg total) by mouth 2 (two) times daily with a meal. 07/22/16  Yes Delma Freeze, FNP  furosemide (LASIX) 40 MG tablet Take 1 tablet (40 mg total) by mouth 2 (two) times daily. 07/22/16  Yes Delma Freeze, FNP  lisinopril (PRINIVIL,ZESTRIL) 2.5 MG tablet Take 1 tablet (2.5 mg total) by mouth daily. 07/22/16  Yes Delma Freeze, FNP  metFORMIN (GLUCOPHAGE) 500 MG tablet Take 1 tablet (500 mg total) by mouth 2 (two) times daily with a meal. 07/22/16  Yes Delma Freeze, FNP  multivitamin (ONE-A-DAY MEN'S) TABS tablet Take 1 tablet by mouth daily.   Yes Historical Provider, MD  vitamin C (ASCORBIC ACID) 500 MG tablet Take 500 mg by mouth daily.   Yes Historical Provider, MD    Review of Systems  Constitutional: Positive for fatigue. Negative for appetite change.  HENT: Positive for postnasal drip. Negative for congestion and sore throat.   Eyes: Negative.   Respiratory: Negative for cough, chest tightness and shortness of breath.   Cardiovascular: Positive for leg swelling (worse when fall asleep in the chair). Negative for chest pain and palpitations.  Gastrointestinal: Negative for abdominal distention and abdominal pain.  Endocrine: Negative.   Genitourinary: Negative.   Musculoskeletal: Negative for back pain and neck pain.  Skin: Negative.   Allergic/Immunologic: Negative.   Neurological: Negative for dizziness and light-headedness.  Hematological: Negative for  adenopathy. Does not bruise/bleed easily.  Psychiatric/Behavioral: Negative for dysphoric mood, sleep disturbance and suicidal ideas. The patient is not nervous/anxious.    Vitals:   08/20/16 1029  BP: 124/70  Pulse: 77  Resp: 18  Weight: (!) 415 lb (188.2 kg)  Height: 6\' 3"  (1.905 m)   Wt Readings from Last 3 Encounters:  08/20/16 (!) 415 lb (188.2 kg)  07/22/16 (!) 401 lb 6 oz (182.1 kg)  07/01/16 (!) 377 lb 8 oz (171.2 kg)   Lab Results  Component Value Date   CREATININE 0.87 06/30/2016   CREATININE 0.65 06/28/2016   CREATININE 0.92 06/27/2016    Physical Exam  Constitutional: He is  oriented to person, place, and time. He appears well-developed and well-nourished.  HENT:  Head: Normocephalic and atraumatic.  Eyes: Conjunctivae are normal. Pupils are equal, round, and reactive to light.  Neck: Normal range of motion. Neck supple.  Cardiovascular: Normal rate and regular rhythm.   Pulmonary/Chest: Effort normal. He has no wheezes. He has no rales.  Abdominal: Soft. He exhibits no distension. There is no tenderness.  Musculoskeletal: He exhibits edema (2+ pitting edema in bilatearl lower legs). He exhibits no tenderness.  Neurological: He is alert and oriented to person, place, and time.  Skin: Skin is warm and dry.  Psychiatric: He has a normal mood and affect. His behavior is normal. Thought content normal.  Vitals reviewed.  Assessment & Plan:  1: Chronic heart failure with reduced ejection fraction- - NYHA class II - moderately fluid overloaded today with pedal edema and increased weight. Weight up 14 pounds since last here on 07/22/16 - has not been weighing daily and he was encouraged to begin weighing daily so that he can call for an overnight weight gain of >2 pounds or a weekly weight gain of >5 pounds.  - will add metolazone 5mg  daily for the next 5 days. 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  - not adding salt to his food at home but does cook with it at work and, again, he's sampling the food every time he is at work cooking. Importance of closely following a 2000mg  sodium diet was reviewed with him and written information was given to him about this. Admits that he likes to eat Asian foods and is aware that soy sauce is high in sodium. - he will finish out his lisinopril, skip one day and then begin entresto 24/26mg  twice daily. 30 day voucher given so that he can get the first 30 days of medication free - has  to turn in tax information to [facility] and then he will be enrolled. Will go ahead and send in 90 day scripts of his medications to them - PharmD went in and reviewed entresto and metolazone usage with the patient  2: HTN- - BP looks good today - continues going to the gym 4-5 days/week and walks on the treadmill - he has not gotten paperwork turned in with [facility] yet and he was encouraged to do so  3: Obstructive sleep apnea- - wearing CPAP nightly except when he falls asleep downstairs in the chair. He does say that on those nights he doesn't wear his CPAP that he feels more tired and notices that his edema is worse  4: Tobacco use- - continues to use vape instead  of cigarettes - complete cessation of that discussed for 3 minutes with him.   Medication bottles were reviewed.  Return in 1 month or sooner for any questions/problems before then.

## 2016-08-20 NOTE — Patient Instructions (Addendum)
Continue weighing daily and call for an overnight weight gain of > 2 pounds or a weekly weight gain of >5 pounds.  Finish taking lisinopril, skip one day and then begin entresto 24/26mg  twice daily.  Adding metolazone (fluid pill) daily for the next 5 days if needed. Best to take it 1/2 hour prior to furosemide tablet.

## 2016-08-21 ENCOUNTER — Telehealth: Payer: Self-pay | Admitting: Pharmacy Technician

## 2016-08-21 MED ORDER — SACUBITRIL-VALSARTAN 24-26 MG PO TABS: 1.0000 | ORAL_TABLET | Freq: Two times a day (BID) | ORAL | 3 refills | Status: DC

## 2016-08-21 MED ORDER — CARVEDILOL 12.5 MG PO TABS: 12.5000 mg | ORAL_TABLET | Freq: Two times a day (BID) | ORAL | 3 refills | Status: DC

## 2016-08-21 MED ORDER — METFORMIN HCL 500 MG PO TABS: 500.0000 mg | ORAL_TABLET | Freq: Two times a day (BID) | ORAL | 3 refills | Status: DC

## 2016-08-21 NOTE — Telephone Encounter (Signed)
Called patient to inform that we still need 2017 tax return.  Cannot order Entresto until we receive this documentation.  [name] Care Manager [facility]

## 2016-08-25 ENCOUNTER — Other Ambulatory Visit: Payer: Self-pay

## 2016-08-25 MED ORDER — SACUBITRIL-VALSARTAN 24-26 MG PO TABS: 1.0000 | ORAL_TABLET | Freq: Two times a day (BID) | ORAL | 0 refills | Status: DC

## 2016-08-25 MED ORDER — FUROSEMIDE 40 MG PO TABS: 40.0000 mg | ORAL_TABLET | Freq: Two times a day (BID) | ORAL | 0 refills | Status: DC

## 2016-09-17 ENCOUNTER — Ambulatory Visit: Payer: Self-pay | Admitting: Family

## 2016-09-22 ENCOUNTER — Encounter: Payer: Self-pay | Admitting: Family

## 2016-09-22 ENCOUNTER — Ambulatory Visit: Payer: Self-pay | Attending: Family | Admitting: Family

## 2016-09-22 VITALS — BP 118/80 | HR 91 | Resp 18 | Ht 75.0 in | Wt >= 6400 oz

## 2016-09-22 DIAGNOSIS — J45909 Unspecified asthma, uncomplicated: Secondary | ICD-10-CM | POA: Insufficient documentation

## 2016-09-22 DIAGNOSIS — I5022 Chronic systolic (congestive) heart failure: Secondary | ICD-10-CM | POA: Insufficient documentation

## 2016-09-22 DIAGNOSIS — E669 Obesity, unspecified: Secondary | ICD-10-CM | POA: Insufficient documentation

## 2016-09-22 DIAGNOSIS — Z7984 Long term (current) use of oral hypoglycemic drugs: Secondary | ICD-10-CM | POA: Insufficient documentation

## 2016-09-22 DIAGNOSIS — Z6841 Body Mass Index (BMI) 40.0 and over, adult: Secondary | ICD-10-CM | POA: Insufficient documentation

## 2016-09-22 DIAGNOSIS — F17210 Nicotine dependence, cigarettes, uncomplicated: Secondary | ICD-10-CM | POA: Insufficient documentation

## 2016-09-22 DIAGNOSIS — E119 Type 2 diabetes mellitus without complications: Secondary | ICD-10-CM | POA: Insufficient documentation

## 2016-09-22 DIAGNOSIS — Z8249 Family history of ischemic heart disease and other diseases of the circulatory system: Secondary | ICD-10-CM | POA: Insufficient documentation

## 2016-09-22 DIAGNOSIS — I10 Essential (primary) hypertension: Secondary | ICD-10-CM

## 2016-09-22 DIAGNOSIS — I110 Hypertensive heart disease with heart failure: Secondary | ICD-10-CM | POA: Insufficient documentation

## 2016-09-22 DIAGNOSIS — Z720 Tobacco use: Secondary | ICD-10-CM

## 2016-09-22 DIAGNOSIS — G4733 Obstructive sleep apnea (adult) (pediatric): Secondary | ICD-10-CM | POA: Insufficient documentation

## 2016-09-22 LAB — BASIC METABOLIC PANEL
Anion gap: 9 (ref 5–15)
BUN: 18 mg/dL (ref 6–20)
CHLORIDE: 93 mmol/L — AB (ref 101–111)
CO2: 35 mmol/L — ABNORMAL HIGH (ref 22–32)
CREATININE: 0.62 mg/dL (ref 0.61–1.24)
Calcium: 9.6 mg/dL (ref 8.9–10.3)
GFR calc Af Amer: >60 mL/min (ref >60)
GFR calc non Af Amer: >60 mL/min (ref >60)
GLUCOSE: 142 mg/dL — AB (ref 65–99)
POTASSIUM: 4 mmol/L (ref 3.5–5.1)
Sodium: 137 mmol/L (ref 135–145)

## 2016-09-22 MED ORDER — LISINOPRIL 2.5 MG PO TABS: 2.5000 mg | ORAL_TABLET | Freq: Every day | ORAL | 5 refills | Status: DC

## 2016-09-22 MED ORDER — TORSEMIDE 20 MG PO TABS: 40.0000 mg | ORAL_TABLET | Freq: Two times a day (BID) | ORAL | 3 refills | Status: DC

## 2016-09-22 NOTE — Progress Notes (Signed)
Patient ID: Marc Wright, male    DOB: 08/19/1975, 41 y.o.   MRN: [id number]  HPI  Marc Wright is a 41 y/o male with a history of obstructive sleep apnea (with CPAP), DM, asthma, obesity, tobacco use and chronic heart failure.   Last echo was done 06/26/16 and showed an EF of 25-30% with mild Marc/TR.   Admitted on 06/25/16 with acute heart failure & pneumonia. He was treated with  IV steroids, nebulizers and antibiotics. Was given oxygen short-term. Cardiology consult was obtained. Was also given IV diuretics. Was in the ED on 06/23/16 with shortness of breath, was treated and released.   He presents today for his follow-up visit with fatigue which he feels like is worse since starting the entresto. Denies any shortness of breath with exertion or rest. Does endorse swelling in his lower legs especially when he falls asleep in the chair and doesn't get his feet elevated. Wearing his CPAP on a nightly basis. Continues to go to the gym 2-3 days/week. 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  Took the course of metolazone and said that his swelling went down quite a bit while he was taking it.  Past Medical History:  Diagnosis Date  . Asthma   . CHF (congestive heart failure) (HCC)   . Diabetes mellitus without complication (HCC)   . OSA on CPAP   . Pneumonia 06/2016   Past Surgical History:  Procedure Laterality Date  . I&D EXTREMITY    . SKIN GRAFT     Family History  Problem Relation Age of Onset  . Congestive Heart Failure Mother   . Hypertension Mother    Social History  Substance Use Topics  . Smoking status: Current Every Day Smoker    Packs/day: 0.75    Types: Cigarettes  . Smokeless tobacco: Never Used  . Alcohol use Yes     Comment: occasional    No Known Allergies   Prior to Admission medications   Medication Sig Start Date End Date Taking? Authorizing Provider  B Complex-C (B-COMPLEX WITH VITAMIN C) tablet Take 1 tablet by mouth daily.   Yes Historical  Provider, MD  carvedilol (COREG) 12.5 MG tablet Take 1 tablet (12.5 mg total) by mouth 2 (two) times daily with a meal. 08/21/16  Yes Delma Freeze, FNP  metFORMIN (GLUCOPHAGE) 500 MG tablet Take 1 tablet (500 mg total) by mouth 2 (two) times daily with a meal. 08/21/16  Yes Delma Freeze, FNP  multivitamin (ONE-A-DAY MEN'S) TABS tablet Take 1 tablet by mouth daily.   Yes Historical Provider, MD  vitamin C (ASCORBIC ACID) 500 MG tablet Take 500 mg by mouth daily.   Yes Historical Provider, MD  lisinopril (PRINIVIL,ZESTRIL) 2.5 MG tablet Take 1 tablet (2.5 mg total) by mouth daily. 09/22/16   Delma Freeze, FNP  metolazone (ZAROXOLYN) 5 MG tablet Take 1 tablet (5 mg total) by mouth daily. 08/20/16 08/25/16  Delma Freeze, FNP  torsemide (DEMADEX) 20 MG tablet Take 2 tablets (40 mg total) by mouth 2 (two) times daily. 09/22/16 10/22/16  Delma Freeze, FNP    Review of Systems  Constitutional: Positive for fatigue (worse since beginning entresto). Negative for appetite change.  HENT: Positive for congestion and rhinorrhea.   Eyes: Negative.   Respiratory: Negative for cough, chest tightness and shortness of breath.   Cardiovascular: Positive for leg swelling. Negative for chest pain and palpitations.  Gastrointestinal: Negative for abdominal distention  and abdominal pain.  Endocrine: Negative.   Genitourinary: Negative.   Musculoskeletal: Negative.   Skin: Negative.   Allergic/Immunologic: Negative.   Neurological: Negative for dizziness and light-headedness.  Hematological: Negative for adenopathy. Does not bruise/bleed easily.  Psychiatric/Behavioral: Negative for dysphoric mood and sleep disturbance (wearing CPAP nightly). The patient is not nervous/anxious.    Vitals:   09/22/16 0916  BP: 118/80  Pulse: 91  Resp: 18  SpO2: 95%  Weight: (!) 420 lb 4 oz (190.6 kg)  Height: 6\' 3"  (1.905 m)   Wt Readings from Last 3 Encounters:  09/22/16 (!) 420 lb 4 oz (190.6 kg)  08/20/16 (!) 415 lb  (188.2 kg)  07/22/16 (!) 401 lb 6 oz (182.1 kg)   Lab Results  Component Value Date   CREATININE 0.87 06/30/2016   CREATININE 0.65 06/28/2016   CREATININE 0.92 06/27/2016   Physical Exam  Constitutional: He is oriented to person, place, and time. He appears well-developed and well-nourished.  HENT:  Head: Normocephalic and atraumatic.  Eyes: Conjunctivae are normal. Pupils are equal, round, and reactive to light.  Neck: Normal range of motion. Neck supple. No JVD present.  Cardiovascular: Normal rate and regular rhythm.   Pulmonary/Chest: Effort normal. He has no wheezes. He has no rales.  Abdominal: Soft. He exhibits no distension. There is no tenderness.  Musculoskeletal: He exhibits edema (2+ pitting edema in bilateral lower legs). He exhibits no tenderness.  Neurological: He is alert and oriented to person, place, and time.  Skin: Skin is warm and dry.  Psychiatric: He has a normal mood and affect. His behavior is normal. Thought content normal.  Nursing note and vitals reviewed.     Assessment & Plan:  1: Chronic heart failure with reduced ejection fraction- - NYHA class II - mildly fluid overloaded today with pedal edema and increased weight. Weight up 5 pounds since last here on 08/20/16 - has not been weighing daily because his weight is too much for his scale. He's going to try and get a scale that will weigh him so that he can begin weighing daily so that he can call for an overnight weight gain of >2 pounds or a weekly weight gain of >5 pounds.  - will finish out his furosemide and begin torsemide 40mg  twice daily. 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  - not adding salt to his food at home but does cook with it at work and, again, he's sampling the food every time he is at work cooking. Importance of closely following a 2000mg  sodium diet was reviewed with him.  Admits that he likes to eat Asian foods and is aware that soy sauce is high in sodium. - feels more fatigued since beginning entresto and says that he didn't feel like this when on lisinopril. He will finish out the entresto and then resume his lisinopril at 2.5mg  daily and titrate as able - checking a BMP today  2: HTN- - BP looks good today - continues going to the gym 2-3 days/week and walks on the treadmill or rides the bicycle - he has not gotten paperwork turned in with [facility] yet and he was encouraged to do so  3: Obstructive sleep apnea- - wearing CPAP nightly except when he falls asleep downstairs in the chair. He does say that on those nights he doesn't  wear his CPAP that he feels more tired and notices that his edema is worse  4: Tobacco use- - continues to use vape instead of cigarettes - complete cessation of that discussed for 3 minutes with him.   Medication bottles were reviewed with him.   Return here in 1 week or sooner for any questions/problems before then.

## 2016-09-22 NOTE — Patient Instructions (Signed)
Finish entresto and lasix.  When entresto is finished, then begin lisinopril 2.5mg  once daily.   Changing lasix (furosemide) to torsemide 40mg  twice daily. Can decrease it if you get a good response to it.

## 2016-09-26 ENCOUNTER — Telehealth: Payer: Self-pay | Admitting: Pharmacy Technician

## 2016-09-26 NOTE — Telephone Encounter (Signed)
Marc Wright 09/29/16 Marc Wright received a phone call from [facility] regarding the [name] for patient. [name] stated she saw patient on 09/22/16 and discontinued patient's use of Entresto.  [name] has prescribed Lisinopril in place of the [place].   [name] Care Manager [facility]

## 2016-09-29 ENCOUNTER — Encounter: Payer: Self-pay | Admitting: Family

## 2016-09-29 ENCOUNTER — Ambulatory Visit: Payer: Self-pay | Attending: Family | Admitting: Family

## 2016-09-29 VITALS — BP 120/80 | HR 95 | Resp 18 | Ht 75.0 in | Wt >= 6400 oz

## 2016-09-29 DIAGNOSIS — G4733 Obstructive sleep apnea (adult) (pediatric): Secondary | ICD-10-CM | POA: Insufficient documentation

## 2016-09-29 DIAGNOSIS — Z79899 Other long term (current) drug therapy: Secondary | ICD-10-CM | POA: Insufficient documentation

## 2016-09-29 DIAGNOSIS — Z6841 Body Mass Index (BMI) 40.0 and over, adult: Secondary | ICD-10-CM | POA: Insufficient documentation

## 2016-09-29 DIAGNOSIS — I5022 Chronic systolic (congestive) heart failure: Secondary | ICD-10-CM | POA: Insufficient documentation

## 2016-09-29 DIAGNOSIS — Z8249 Family history of ischemic heart disease and other diseases of the circulatory system: Secondary | ICD-10-CM | POA: Insufficient documentation

## 2016-09-29 DIAGNOSIS — Z7984 Long term (current) use of oral hypoglycemic drugs: Secondary | ICD-10-CM | POA: Insufficient documentation

## 2016-09-29 DIAGNOSIS — F17210 Nicotine dependence, cigarettes, uncomplicated: Secondary | ICD-10-CM | POA: Insufficient documentation

## 2016-09-29 DIAGNOSIS — J45909 Unspecified asthma, uncomplicated: Secondary | ICD-10-CM | POA: Insufficient documentation

## 2016-09-29 DIAGNOSIS — Z720 Tobacco use: Secondary | ICD-10-CM

## 2016-09-29 DIAGNOSIS — I110 Hypertensive heart disease with heart failure: Secondary | ICD-10-CM | POA: Insufficient documentation

## 2016-09-29 DIAGNOSIS — E669 Obesity, unspecified: Secondary | ICD-10-CM | POA: Insufficient documentation

## 2016-09-29 DIAGNOSIS — E119 Type 2 diabetes mellitus without complications: Secondary | ICD-10-CM | POA: Insufficient documentation

## 2016-09-29 DIAGNOSIS — I10 Essential (primary) hypertension: Secondary | ICD-10-CM

## 2016-09-29 NOTE — Progress Notes (Signed)
Subjective:    Patient ID: Marc Wright, male    DOB: 1976/03/14, 41 y.o.   MRN: [id number]  HPI   Marc Wright is a 41 y/o male with a history of obstructive sleep apnea (with CPAP), DM, asthma, obesity, tobacco use and chronic heart failure.   Last echo was done 06/26/16 and showed an EF of 25-30% with mild Marc/TR.   Admitted on 06/25/16 with acute heart failure & pneumonia. He was treated with  IV steroids, nebulizers and antibiotics. Was given oxygen short-term. Cardiology consult was obtained. Was also given IV diuretics. Was in the ED on 06/23/16 with shortness of breath, was treated and released.   He presents today for his follow-up visit with fatigue. Denies any shortness of breath with exertion or rest. Does endorse swelling in his lower legs especially when he falls asleep in the chair and doesn't get his feet elevated. Wearing his CPAP on a nightly basis. Continues to go to the gym 2-3 days/week. 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 Continues on the entresto and then will restart lisinopril at that time as he said that he didn't feel as tired on the lisinopril.  Past Medical History:  Diagnosis Date  . Asthma   . CHF (congestive heart failure) (HCC)   . Diabetes mellitus without complication (HCC)   . OSA on CPAP   . Pneumonia 06/2016   Past Surgical History:  Procedure Laterality Date  . I&D EXTREMITY    . SKIN GRAFT     Family History  Problem Relation Age of Onset  . Congestive Heart Failure Mother   . Hypertension Mother    Social History  Substance Use Topics  . Smoking status: Current Every Day Smoker    Packs/day: 0.75    Types: Cigarettes  . Smokeless tobacco: Never Used  . Alcohol use Yes     Comment: occasional    No Known Allergies Prior to Admission medications   Medication Sig Start Date End Date Taking? Authorizing Provider  B Complex-C (B-COMPLEX WITH VITAMIN C) tablet Take 1 tablet by mouth daily.   Yes Historical Provider, MD   carvedilol (COREG) 12.5 MG tablet Take 1 tablet (12.5 mg total) by mouth 2 (two) times daily with a meal. 08/21/16  Yes Delma Freeze, FNP  metFORMIN (GLUCOPHAGE) 500 MG tablet Take 1 tablet (500 mg total) by mouth 2 (two) times daily with a meal. 08/21/16  Yes Delma Freeze, FNP  multivitamin (ONE-A-DAY MEN'S) TABS tablet Take 1 tablet by mouth daily.   Yes Historical Provider, MD  sacubitril-valsartan (ENTRESTO) 24-26 MG Take 1 tablet by mouth 2 (two) times daily.   Yes Historical Provider, MD  torsemide (DEMADEX) 20 MG tablet Take 2 tablets (40 mg total) by mouth 2 (two) times daily. 09/22/16 10/22/16 Yes Delma Freeze, FNP  vitamin C (ASCORBIC ACID) 500 MG tablet Take 500 mg by mouth daily.   Yes Historical Provider, MD           Review of Systems  Constitutional: Positive for fatigue. Negative for appetite change.  HENT: Positive for congestion and postnasal drip. Negative for sore throat.   Eyes: Negative.   Respiratory: Negative for chest tightness and shortness of breath.   Cardiovascular: Positive for leg swelling. Negative for chest pain and palpitations.  Gastrointestinal: Negative for abdominal distention and abdominal pain.  Endocrine: Negative.   Genitourinary: Negative.   Musculoskeletal: Negative.   Skin: Negative.   Allergic/Immunologic:  Negative.   Neurological: Positive for light-headedness (if change positions too quickly). Negative for dizziness.  Hematological: Negative for adenopathy. Does not bruise/bleed easily.  Psychiatric/Behavioral: Negative for dysphoric mood, sleep disturbance (wearing CPAP nightly) and suicidal ideas. The patient is not nervous/anxious.    Vitals:   09/29/16 0913  BP: 120/80  Pulse: 95  Resp: 18  SpO2: 98%  Weight: (!) 430 lb 6 oz (195.2 kg)  Height: 6\' 3"  (1.905 m)   Wt Readings from Last 3 Encounters:  09/29/16 (!) 430 lb 6 oz (195.2 kg)  09/22/16 (!) 420 lb 4 oz (190.6 kg)  08/20/16 (!) 415 lb (188.2 kg)   Lab Results  Component  Value Date   CREATININE 0.62 09/22/2016   CREATININE 0.87 06/30/2016   CREATININE 0.65 06/28/2016      Objective:   Physical Exam  Constitutional: He is oriented to person, place, and time. He appears well-developed and well-nourished.  HENT:  Head: Normocephalic and atraumatic.  Eyes: Conjunctivae are normal. Pupils are equal, round, and reactive to light.  Neck: Normal range of motion. Neck supple. No JVD present.  Cardiovascular: Regular rhythm.  Tachycardia present.   Pulmonary/Chest: Effort normal. He has no wheezes. He has no rales.  Abdominal: Soft. He exhibits no distension. There is no tenderness.  Musculoskeletal: He exhibits edema (2+ pitting edema in bilateral lower legs). He exhibits no tenderness.  Neurological: He is alert and oriented to person, place, and time.  Skin: Skin is warm and dry.  Psychiatric: He has a normal mood and affect. His behavior is normal. Thought content normal.  Nursing note and vitals reviewed.     Assessment & Plan:   1: Chronic heart failure with reduced ejection fraction- - NYHA class II - mildly fluid overloaded today with pedal edema and increased weight. Weight up 10 pounds since last visit. Has gained 29 pounds since January 2018 - has not been weighing daily because his weight is too much for his scale. He's going to try and get a scale that will weigh him so that he can begin weighing daily so that he can call for an overnight weight gain of >2 pounds or a weekly weight gain of >5 pounds.  - he's not sure he can afford the torsemide. 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  - not adding salt to his food at home but does cook with it at work and, again, he's sampling the food every time he is at work cooking.  Importance of closely following a 2000mg  sodium diet was reviewed with him. Admits that he likes to eat Asian foods and is aware that soy sauce is high in sodium. - feels more fatigued since beginning entresto and says that he didn't feel like this when on lisinopril. He will finish out the entresto and then resume his lisinopril at 2.5mg  daily and titrate as able  2: HTN- - BP looks good today - continues going to the gym 2-3 days/week and walks on the treadmill or rides the bicycle - he has not gotten paperwork turned in with [facility] yet and he was encouraged to do so  3: Obstructive sleep apnea- - wearing CPAP nightly except when he falls asleep downstairs  in the chair. He does say that on those nights he doesn't wear his CPAP that he feels more tired and notices that his edema is worse  4: Tobacco use- - continues to use vape instead of cigarettes - complete cessation of that discussed for 3 minutes with him.   Medication bottles were reviewed with him.   Return in 2 months or sooner for any questions/problems before then.

## 2016-09-29 NOTE — Patient Instructions (Signed)
Begin weighing daily and call for an overnight weight gain of > 2 pounds or a weekly weight gain of >5 pounds. 

## 2016-10-31 ENCOUNTER — Telehealth: Payer: Self-pay

## 2016-10-31 ENCOUNTER — Other Ambulatory Visit: Payer: Self-pay | Admitting: Family

## 2016-10-31 MED ORDER — FUROSEMIDE 80 MG PO TABS: 80.0000 mg | ORAL_TABLET | Freq: Two times a day (BID) | ORAL | 5 refills | Status: DC

## 2016-10-31 MED ORDER — LISINOPRIL 2.5 MG PO TABS: 2.5000 mg | ORAL_TABLET | Freq: Every day | ORAL | 5 refills | Status: DC

## 2016-10-31 NOTE — Telephone Encounter (Signed)
Marc Wright called to obtain a prescription for Furosemide as discussed at his last visit. He also requested a refill on his lisinopril as the Sherryll Burger makes him tired.

## 2016-10-31 NOTE — Telephone Encounter (Signed)
Prescription sent in by [facility]

## 2016-11-04 ENCOUNTER — Other Ambulatory Visit: Payer: Self-pay | Admitting: Family

## 2016-11-04 MED ORDER — LISINOPRIL 2.5 MG PO TABS: 2.5000 mg | ORAL_TABLET | Freq: Every day | ORAL | 5 refills | Status: DC

## 2016-11-04 MED ORDER — FUROSEMIDE 80 MG PO TABS: 80.0000 mg | ORAL_TABLET | Freq: Two times a day (BID) | ORAL | 5 refills | Status: DC

## 2016-12-11 ENCOUNTER — Other Ambulatory Visit: Payer: Self-pay

## 2016-12-11 MED ORDER — METFORMIN HCL 500 MG PO TABS: 500.0000 mg | ORAL_TABLET | Freq: Two times a day (BID) | ORAL | 3 refills | Status: DC

## 2016-12-11 MED ORDER — CARVEDILOL 12.5 MG PO TABS: 12.5000 mg | ORAL_TABLET | Freq: Two times a day (BID) | ORAL | 3 refills | Status: DC

## 2016-12-15 ENCOUNTER — Encounter: Payer: Self-pay | Admitting: Family

## 2016-12-15 ENCOUNTER — Ambulatory Visit: Payer: Self-pay | Attending: Family | Admitting: Family

## 2016-12-15 ENCOUNTER — Telehealth: Payer: Self-pay | Admitting: Family

## 2016-12-15 VITALS — BP 140/80 | HR 88 | Resp 20 | Ht 75.0 in | Wt >= 6400 oz

## 2016-12-15 DIAGNOSIS — Z79899 Other long term (current) drug therapy: Secondary | ICD-10-CM | POA: Insufficient documentation

## 2016-12-15 DIAGNOSIS — G4733 Obstructive sleep apnea (adult) (pediatric): Secondary | ICD-10-CM | POA: Insufficient documentation

## 2016-12-15 DIAGNOSIS — Z7984 Long term (current) use of oral hypoglycemic drugs: Secondary | ICD-10-CM | POA: Insufficient documentation

## 2016-12-15 DIAGNOSIS — J45909 Unspecified asthma, uncomplicated: Secondary | ICD-10-CM | POA: Insufficient documentation

## 2016-12-15 DIAGNOSIS — I10 Essential (primary) hypertension: Secondary | ICD-10-CM

## 2016-12-15 DIAGNOSIS — Z720 Tobacco use: Secondary | ICD-10-CM

## 2016-12-15 DIAGNOSIS — I5022 Chronic systolic (congestive) heart failure: Secondary | ICD-10-CM | POA: Insufficient documentation

## 2016-12-15 DIAGNOSIS — E669 Obesity, unspecified: Secondary | ICD-10-CM | POA: Insufficient documentation

## 2016-12-15 DIAGNOSIS — I110 Hypertensive heart disease with heart failure: Secondary | ICD-10-CM | POA: Insufficient documentation

## 2016-12-15 DIAGNOSIS — Z8249 Family history of ischemic heart disease and other diseases of the circulatory system: Secondary | ICD-10-CM | POA: Insufficient documentation

## 2016-12-15 DIAGNOSIS — E119 Type 2 diabetes mellitus without complications: Secondary | ICD-10-CM | POA: Insufficient documentation

## 2016-12-15 DIAGNOSIS — R42 Dizziness and giddiness: Secondary | ICD-10-CM | POA: Insufficient documentation

## 2016-12-15 DIAGNOSIS — F17210 Nicotine dependence, cigarettes, uncomplicated: Secondary | ICD-10-CM | POA: Insufficient documentation

## 2016-12-15 LAB — BASIC METABOLIC PANEL
ANION GAP: 11 (ref 5–15)
BUN: 14 mg/dL (ref 6–20)
CALCIUM: 9.2 mg/dL (ref 8.9–10.3)
CO2: 34 mmol/L — ABNORMAL HIGH (ref 22–32)
Chloride: 92 mmol/L — ABNORMAL LOW (ref 101–111)
Creatinine, Ser: 0.76 mg/dL (ref 0.61–1.24)
GFR calc Af Amer: >60 mL/min (ref >60)
GLUCOSE: 370 mg/dL — AB (ref 65–99)
Potassium: 3.9 mmol/L (ref 3.5–5.1)
Sodium: 137 mmol/L (ref 135–145)

## 2016-12-15 NOTE — Progress Notes (Signed)
Subjective:    Patient ID: Marc Wright, male    DOB: August 06, 1975, 41 y.o.   MRN: [id number]  HPI  Mr Hartig is a 41 y/o male with a history of obstructive sleep apnea (with CPAP), DM, asthma, obesity, tobacco use and chronic heart failure.   Last echo was done 06/26/16 and showed an EF of 25-30% with mild MR/TR.   Admitted on 06/25/16 with acute heart failure & pneumonia. He was treated with  IV steroids, nebulizers and antibiotics. Was given oxygen short-term. Cardiology consult was obtained. Was also given IV diuretics. Was in the ED on 06/23/16 with shortness of breath, was treated and released.   He presents today for his follow-up visit with a chief complaint of chronic fatigue. He says that this has been present for years with varying levels of severity. Does feel like it's slightly better since changing back to lisinopril from the entresto. He has associated head congestion, leg edema, light-headedness and weight gain along with this.   Past Medical History:  Diagnosis Date  . Asthma   . CHF (congestive heart failure) (HCC)   . Diabetes mellitus without complication (HCC)   . OSA on CPAP   . Pneumonia 06/2016   Past Surgical History:  Procedure Laterality Date  . I&D EXTREMITY    . SKIN GRAFT     Family History  Problem Relation Age of Onset  . Congestive Heart Failure Mother   . Hypertension Mother    Social History  Substance Use Topics  . Smoking status: Current Every Day Smoker    Packs/day: 0.75    Types: Cigarettes  . Smokeless tobacco: Never Used  . Alcohol use Yes     Comment: occasional    No Known Allergies  Prior to Admission medications   Medication Sig Start Date End Date Taking? Authorizing Provider  B Complex-C (B-COMPLEX WITH VITAMIN C) tablet Take 1 tablet by mouth daily.   Yes [provider]  carvedilol (COREG) 12.5 MG tablet Take 1 tablet (12.5 mg total) by mouth 2 (two) times daily with a meal. 12/11/16  Yes Alyne Martinson A, FNP   furosemide (LASIX) 80 MG tablet Take 1 tablet (80 mg total) by mouth 2 (two) times daily. 11/04/16 02/02/17 Yes Lucindy Borel, [name] A, FNP  lisinopril (PRINIVIL,ZESTRIL) 2.5 MG tablet Take 1 tablet (2.5 mg total) by mouth daily. 11/04/16  Yes Delma Freeze, FNP  metFORMIN (GLUCOPHAGE) 500 MG tablet Take 1 tablet (500 mg total) by mouth 2 (two) times daily with a meal. 12/11/16  Yes Darrel Gloss A, FNP  multivitamin (ONE-A-DAY MEN'S) TABS tablet Take 1 tablet by mouth daily.   Yes [provider]  vitamin C (ASCORBIC ACID) 500 MG tablet Take 500 mg by mouth daily.   Yes [provider]    Review of Systems  Constitutional: Positive for fatigue. Negative for appetite change.  HENT: Positive for congestion and postnasal drip. Negative for sore throat.   Eyes: Negative.   Respiratory: Negative for cough, chest tightness and shortness of breath.   Cardiovascular: Positive for leg swelling. Negative for chest pain and palpitations.  Gastrointestinal: Negative for abdominal distention and abdominal pain.  Endocrine: Negative.   Genitourinary: Negative.   Musculoskeletal: Negative.   Skin: Negative.   Allergic/Immunologic: Negative.   Neurological: Positive for light-headedness (if change positions too quickly). Negative for dizziness.  Hematological: Negative for adenopathy. Does not bruise/bleed easily.  Psychiatric/Behavioral: Negative for dysphoric mood, sleep disturbance (wearing CPAP nightly) and suicidal  ideas. The patient is not nervous/anxious.    Vitals:   12/15/16 0908  BP: 140/80  Pulse: 88  Resp: 20  SpO2: 95%  Weight: (!) 466 lb (211.4 kg)  Height: 6\' 3"  (1.905 m)   Wt Readings from Last 3 Encounters:  12/15/16 (!) 466 lb (211.4 kg)  09/29/16 (!) 430 lb 6 oz (195.2 kg)  09/22/16 (!) 420 lb 4 oz (190.6 kg)    Lab Results  Component Value Date   CREATININE 0.62 09/22/2016   CREATININE 0.87 06/30/2016   CREATININE 0.65 06/28/2016      Objective:    Physical Exam  Constitutional: He is oriented to person, place, and time. He appears well-developed and well-nourished.  HENT:  Head: Normocephalic and atraumatic.  Neck: Normal range of motion. Neck supple. No JVD present.  Cardiovascular: Normal rate and regular rhythm.   Pulmonary/Chest: Effort normal. He has no wheezes. He has no rales.  Abdominal: Soft. He exhibits no distension. There is no tenderness.  Musculoskeletal: He exhibits edema (3+ pitting edema in bilateral lower legs). He exhibits no tenderness.  Neurological: He is alert and oriented to person, place, and time.  Skin: Skin is warm and dry.  Psychiatric: He has a normal mood and affect. His behavior is normal. Thought content normal.  Nursing note and vitals reviewed.     Assessment & Plan:   1: Chronic heart failure with reduced ejection fraction- - NYHA class II - continues to be mildly fluid overloaded today with pedal edema and increased weight. Weight up 36 pounds since last visit. Has gained 64.4 pounds since January 2018 - has not been weighing daily because his weight is too much for his scale. He's going to try and get a scale that will weigh him so that he can begin weighing daily so that he can call for an overnight weight gain of >2 pounds or a weekly weight gain of >5 pounds.  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  - not adding salt to his food at home but does cook with it at work and, again, he's sampling the food every time he is at work cooking. Importance of closely following a 2000mg  sodium diet was reviewed with him. Admits that he likes to eat Asian foods and is aware that soy sauce is high in sodium. - now on lisinopril 2.5mg  daily and feels less fatigued with that versus the entresto - BMP checked today - discussed increasing his lisinopril at his  next office visit  2: HTN- - BP looks good today - working days/nights so hasn't been going to the gym as much; emphasized that he has to make exercise a priority  - lengthy discussion had regarding his continued weight gain and the strain that this is putting on his heart - waiting on tax forms to send in to [facility] but he feels like he makes too much money  3: Obstructive sleep apnea- - wearing CPAP nightly except when he falls asleep downstairs in the chair. He does say that on those nights he doesn't wear his CPAP that he feels more tired and notices that his edema is worse  4: Tobacco use- - continues to use vape instead of cigarettes - complete cessation of that discussed for 3 minutes with him.   Medication bottles were  reviewed with him.   Return in 1 month or sooner for any questions/problems before then.

## 2016-12-15 NOTE — Telephone Encounter (Signed)
Left message on voicemail regarding lab work that was drawn today (12/15/16). Potassium normal at 3.9 and GFR normal at >60.   Glucose level elevated at 370. Patient instructed to call back about [place] making a referral to [facility] or [facility] as he thinks he makes too much money to qualify for [employer]. Emphasized the importance of PCP following his diabetes.

## 2016-12-15 NOTE — Patient Instructions (Signed)
Begin weighing daily and call for an overnight weight gain of > 2 pounds or a weekly weight gain of >5 pounds. 

## 2017-01-12 ENCOUNTER — Ambulatory Visit: Payer: Self-pay | Attending: Family | Admitting: Family

## 2017-01-12 ENCOUNTER — Encounter: Payer: Self-pay | Admitting: Family

## 2017-01-12 VITALS — BP 118/70 | HR 97 | Resp 20 | Ht 75.0 in | Wt >= 6400 oz

## 2017-01-12 DIAGNOSIS — M7989 Other specified soft tissue disorders: Secondary | ICD-10-CM | POA: Insufficient documentation

## 2017-01-12 DIAGNOSIS — E669 Obesity, unspecified: Secondary | ICD-10-CM | POA: Insufficient documentation

## 2017-01-12 DIAGNOSIS — G4733 Obstructive sleep apnea (adult) (pediatric): Secondary | ICD-10-CM | POA: Insufficient documentation

## 2017-01-12 DIAGNOSIS — J45909 Unspecified asthma, uncomplicated: Secondary | ICD-10-CM | POA: Insufficient documentation

## 2017-01-12 DIAGNOSIS — Z79899 Other long term (current) drug therapy: Secondary | ICD-10-CM | POA: Insufficient documentation

## 2017-01-12 DIAGNOSIS — I10 Essential (primary) hypertension: Secondary | ICD-10-CM

## 2017-01-12 DIAGNOSIS — Z8249 Family history of ischemic heart disease and other diseases of the circulatory system: Secondary | ICD-10-CM | POA: Insufficient documentation

## 2017-01-12 DIAGNOSIS — I5022 Chronic systolic (congestive) heart failure: Secondary | ICD-10-CM | POA: Insufficient documentation

## 2017-01-12 DIAGNOSIS — Z9889 Other specified postprocedural states: Secondary | ICD-10-CM | POA: Insufficient documentation

## 2017-01-12 DIAGNOSIS — Z7984 Long term (current) use of oral hypoglycemic drugs: Secondary | ICD-10-CM | POA: Insufficient documentation

## 2017-01-12 DIAGNOSIS — R42 Dizziness and giddiness: Secondary | ICD-10-CM | POA: Insufficient documentation

## 2017-01-12 DIAGNOSIS — Z720 Tobacco use: Secondary | ICD-10-CM

## 2017-01-12 DIAGNOSIS — I110 Hypertensive heart disease with heart failure: Secondary | ICD-10-CM | POA: Insufficient documentation

## 2017-01-12 DIAGNOSIS — E119 Type 2 diabetes mellitus without complications: Secondary | ICD-10-CM | POA: Insufficient documentation

## 2017-01-12 LAB — GLUCOSE, CAPILLARY: Glucose-Capillary: 466 mg/dL — ABNORMAL HIGH (ref 65–99)

## 2017-01-12 MED ORDER — LISINOPRIL 5 MG PO TABS: 5.0000 mg | ORAL_TABLET | Freq: Every day | ORAL | 5 refills | Status: DC

## 2017-01-12 NOTE — Patient Instructions (Signed)
Begin weighing daily and call for an overnight weight gain of > 2 pounds or a weekly weight gain of >5 pounds. 

## 2017-01-12 NOTE — Progress Notes (Addendum)
Subjective:    Patient ID: Marc Wright, male    DOB: 1976-06-02, 41 y.o.   MRN: [id number]  HPI  Mr Sidle is a 41 y/o male with a history of obstructive sleep apnea (with CPAP), DM, asthma, obesity, tobacco use and chronic heart failure.   Last echo was done 06/26/16 and showed an EF of 25-30% with mild MR/TR.   Admitted on 06/25/16 with acute heart failure & pneumonia. He was treated with  IV steroids, nebulizers and antibiotics. Was given oxygen short-term. Cardiology consult was obtained. Was also given IV diuretics. Was in the ED on 06/23/16 with shortness of breath, was treated and released.   He presents today for his follow-up visit with a chief complaint of swelling in his legs. He says that this has been present for years with waxing/waning of severity. He has associated light-headedness along with this. He denies any fatigue, shortness of breath or weight gain.   Past Medical History:  Diagnosis Date  . Asthma   . CHF (congestive heart failure) (HCC)   . Diabetes mellitus without complication (HCC)   . OSA on CPAP   . Pneumonia 06/2016   Past Surgical History:  Procedure Laterality Date  . I&D EXTREMITY    . SKIN GRAFT     Family History  Problem Relation Age of Onset  . Congestive Heart Failure Mother   . Hypertension Mother    Social History  Substance Use Topics  . Smoking status: Current Every Day Smoker    Packs/day: 0.75    Types: Cigarettes  . Smokeless tobacco: Never Used  . Alcohol use Yes     Comment: occasional    No Known Allergies  Prior to Admission medications   Medication Sig Start Date End Date Taking? Authorizing Provider  B Complex-C (B-COMPLEX WITH VITAMIN C) tablet Take 1 tablet by mouth daily.   Yes [provider]  carvedilol (COREG) 12.5 MG tablet Take 1 tablet (12.5 mg total) by mouth 2 (two) times daily with a meal. 12/11/16  Yes Caress Reffitt A, FNP  furosemide (LASIX) 80 MG tablet Take 1 tablet (80 mg total) by mouth 2 (two)  times daily. 11/04/16 02/02/17 Yes Raiyan Dalesandro, [name] A, FNP  lisinopril (PRINIVIL,ZESTRIL) 2.5 MG tablet Take 1 tablet (2.5 mg total) by mouth daily. 11/04/16  Yes Delma Freeze, FNP  metFORMIN (GLUCOPHAGE) 500 MG tablet Take 1 tablet (500 mg total) by mouth 2 (two) times daily with a meal. 12/11/16  Yes Cheryll Keisler A, FNP  multivitamin (ONE-A-DAY MEN'S) TABS tablet Take 1 tablet by mouth daily.   Yes [provider]  vitamin C (ASCORBIC ACID) 500 MG tablet Take 500 mg by mouth daily.   Yes [provider]    Review of Systems  Constitutional: Negative for appetite change and fatigue.  HENT: Negative for congestion, postnasal drip and sore throat.   Eyes: Negative.   Respiratory: Negative for cough, chest tightness and shortness of breath.   Cardiovascular: Positive for leg swelling. Negative for chest pain and palpitations.  Gastrointestinal: Negative for abdominal distention and abdominal pain.  Endocrine: Negative.   Genitourinary: Negative.   Musculoskeletal: Negative.   Skin: Negative.   Allergic/Immunologic: Negative.   Neurological: Positive for light-headedness (if change positions too quickly). Negative for dizziness.  Hematological: Negative for adenopathy. Does not bruise/bleed easily.  Psychiatric/Behavioral: Negative for dysphoric mood, sleep disturbance (wearing CPAP nightly) and suicidal ideas. The patient is not nervous/anxious.    Vitals:   01/12/17  1133  BP: 118/70  Pulse: 97  Resp: 20  SpO2: 94%  Weight: (!) 450 lb 2 oz (204.2 kg)  Height: 6\' 3"  (1.905 m)   Wt Readings from Last 3 Encounters:  01/12/17 (!) 450 lb 2 oz (204.2 kg)  12/15/16 (!) 466 lb (211.4 kg)  09/29/16 (!) 430 lb 6 oz (195.2 kg)     Lab Results  Component Value Date   CREATININE 0.76 12/15/2016   CREATININE 0.62 09/22/2016   CREATININE 0.87 06/30/2016      Objective:   Physical Exam  Constitutional: He is oriented to person, place, and time. He appears well-developed  and well-nourished.  HENT:  Head: Normocephalic and atraumatic.  Neck: Normal range of motion. Neck supple. No JVD present.  Cardiovascular: Normal rate and regular rhythm.   Pulmonary/Chest: Effort normal. He has no wheezes. He has no rales.  Abdominal: Soft. He exhibits no distension. There is no tenderness.  Musculoskeletal: He exhibits edema (3+ pitting edema in bilateral lower legs). He exhibits no tenderness.  Neurological: He is alert and oriented to person, place, and time.  Skin: Skin is warm and dry.  Psychiatric: He has a normal mood and affect. His behavior is normal. Thought content normal.  Nursing note and vitals reviewed.     Assessment & Plan:   1: Chronic heart failure with reduced ejection fraction- - NYHA class II - continues to be mildly fluid overloaded today with pedal edema; does try to elevate his legs when he can at home but admits that he works long hours and stands on his feet the whole time at work. - has not been weighing daily because his weight is too much for his scale. He's going to try and get a scale that will weigh him so that he can begin weighing daily so that he can call for an overnight weight gain of >2 pounds or a weekly weight gain of >5 pounds.  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  - not adding salt to his food at home but does cook with it at work and, again, he's sampling the food every time he is at work cooking. Importance of closely following a 2000mg  sodium diet was reviewed with him. Admits that he likes to eat Asian foods and is aware that soy sauce is high in sodium. - will increase his lisinopril to 5mg  daily; new prescription sent in; had worsening fatigue when on entresto - BMP at next visit - does not meet ReDS vest  criteria due to BMI  2: HTN- - BP looks good today - working days/nights so hasn't been going to the gym as much; emphasized that he has to make exercise a priority  - says that [facility] told him that he made too much money based on his tax returns  3: Obstructive sleep apnea- - wearing CPAP nightly except when he falls asleep downstairs in the chair. He does say that on those nights he doesn't wear his CPAP that he feels more tired and notices that his edema is worse - feels like his CPAP is drying him out recently  4: Tobacco use- - continues to use vape instead of cigarettes - complete cessation of that discussed  for 3 minutes with him.   Medication bottles were reviewed with him.   Return in 1 month or sooner for any questions/problems before then.

## 2017-02-09 ENCOUNTER — Ambulatory Visit: Payer: Self-pay | Admitting: Family

## 2017-02-09 ENCOUNTER — Telehealth: Payer: Self-pay | Admitting: Family

## 2017-02-09 NOTE — Telephone Encounter (Signed)
Patient did not show for his [facility] appointment on 02/09/17. Will attempt to reschedule.

## 2017-02-16 ENCOUNTER — Ambulatory Visit: Payer: Self-pay | Attending: Family | Admitting: Family

## 2017-02-16 ENCOUNTER — Encounter: Payer: Self-pay | Admitting: Family

## 2017-02-16 VITALS — BP 140/80 | HR 104 | Resp 18 | Ht 75.0 in | Wt >= 6400 oz

## 2017-02-16 DIAGNOSIS — Z720 Tobacco use: Secondary | ICD-10-CM

## 2017-02-16 DIAGNOSIS — Z8701 Personal history of pneumonia (recurrent): Secondary | ICD-10-CM | POA: Insufficient documentation

## 2017-02-16 DIAGNOSIS — Z79899 Other long term (current) drug therapy: Secondary | ICD-10-CM | POA: Insufficient documentation

## 2017-02-16 DIAGNOSIS — Z7984 Long term (current) use of oral hypoglycemic drugs: Secondary | ICD-10-CM | POA: Insufficient documentation

## 2017-02-16 DIAGNOSIS — I5022 Chronic systolic (congestive) heart failure: Secondary | ICD-10-CM | POA: Insufficient documentation

## 2017-02-16 DIAGNOSIS — E119 Type 2 diabetes mellitus without complications: Secondary | ICD-10-CM | POA: Insufficient documentation

## 2017-02-16 DIAGNOSIS — I10 Essential (primary) hypertension: Secondary | ICD-10-CM

## 2017-02-16 DIAGNOSIS — I110 Hypertensive heart disease with heart failure: Secondary | ICD-10-CM | POA: Insufficient documentation

## 2017-02-16 DIAGNOSIS — G4733 Obstructive sleep apnea (adult) (pediatric): Secondary | ICD-10-CM | POA: Insufficient documentation

## 2017-02-16 DIAGNOSIS — J45909 Unspecified asthma, uncomplicated: Secondary | ICD-10-CM | POA: Insufficient documentation

## 2017-02-16 DIAGNOSIS — F17290 Nicotine dependence, other tobacco product, uncomplicated: Secondary | ICD-10-CM | POA: Insufficient documentation

## 2017-02-16 NOTE — Progress Notes (Signed)
Subjective:    Patient ID: Marc Wright, male    DOB: [birth year], 40 y.o.   MRN: [id number]  HPI  Mr Barbary is a 41 y/o male with a history of obstructive sleep apnea (with CPAP), DM, asthma, obesity, tobacco use and chronic heart failure.   Last echo was done 06/26/16 and showed an EF of 25-30% with mild MR/TR.   Admitted on 06/25/16 with acute heart failure & pneumonia. He was treated with  IV steroids, nebulizers and antibiotics. Was given oxygen short-term. Cardiology consult was obtained. Was also given IV diuretics. Was in the ED on 06/23/16 with shortness of breath, was treated and released.   He presents today for his follow-up visit with a chief complaint of swelling in bilateral lower legs. He describes this as chronic in nature having been present for several years with varying levels of severity. He has associated light-headedness along with this. He denies any fatigue, shortness of breath, cough, chest pain or weight gain. In fact, he's lost quite a bit of weight since he was last here.   Past Medical History:  Diagnosis Date  . Asthma   . CHF (congestive heart failure) (HCC)   . Diabetes mellitus without complication (HCC)   . OSA on CPAP   . Pneumonia 06/2016   Past Surgical History:  Procedure Laterality Date  . I&D EXTREMITY    . SKIN GRAFT     Family History  Problem Relation Age of Onset  . Congestive Heart Failure Mother   . Hypertension Mother    Social History  Substance Use Topics  . Smoking status: Current Every Day Smoker    Packs/day: 0.75    Types: Cigarettes  . Smokeless tobacco: Never Used  . Alcohol use Yes     Comment: occasional    No Known Allergies  Prior to Admission medications   Medication Sig Start Date End Date Taking? Authorizing Provider  B Complex-C (B-COMPLEX WITH VITAMIN C) tablet Take 1 tablet by mouth daily.   Yes [provider]  carvedilol (COREG) 12.5 MG tablet Take 1 tablet (12.5 mg total) by mouth 2 (two) times  daily with a meal. 12/11/16  Yes Hackney, Tina A, FNP  furosemide (LASIX) 80 MG tablet Take 1 tablet (80 mg total) by mouth 2 (two) times daily. 11/04/16 02/16/17 Yes Hackney, [name] A, FNP  lisinopril (PRINIVIL,ZESTRIL) 5 MG tablet Take 1 tablet (5 mg total) by mouth daily. 01/12/17 04/12/17 Yes Delma Freeze, FNP  metFORMIN (GLUCOPHAGE) 500 MG tablet Take 1 tablet (500 mg total) by mouth 2 (two) times daily with a meal. 12/11/16  Yes Hackney, Tina A, FNP  multivitamin (ONE-A-DAY MEN'S) TABS tablet Take 1 tablet by mouth daily.   Yes [provider]  vitamin C (ASCORBIC ACID) 500 MG tablet Take 500 mg by mouth daily.   Yes [provider]    Review of Systems  Constitutional: Negative for appetite change and fatigue.  HENT: Negative for congestion, postnasal drip and sore throat.   Eyes: Negative.   Respiratory: Negative for cough, chest tightness and shortness of breath.   Cardiovascular: Positive for leg swelling. Negative for chest pain and palpitations.  Gastrointestinal: Negative for abdominal distention and abdominal pain.  Endocrine: Negative.   Genitourinary: Negative.   Musculoskeletal: Negative for back pain and neck pain.  Skin: Negative.   Allergic/Immunologic: Negative.   Neurological: Positive for light-headedness (if change positions too quickly). Negative for dizziness.  Hematological: Negative for adenopathy. Does not  bruise/bleed easily.  Psychiatric/Behavioral: Negative for dysphoric mood, sleep disturbance (wearing CPAP nightly) and suicidal ideas. The patient is not nervous/anxious.    Vitals:   02/16/17 1156  Pulse: (!) 104  Resp: 18  SpO2: 96%  Weight: (!) 433 lb (196.4 kg)  Height: 6\' 3"  (1.905 m)   Wt Readings from Last 3 Encounters:  02/16/17 (!) 433 lb (196.4 kg)  01/12/17 (!) 450 lb 2 oz (204.2 kg)  12/15/16 (!) 466 lb (211.4 kg)    Lab Results  Component Value Date   CREATININE 0.76 12/15/2016   CREATININE 0.62 09/22/2016    CREATININE 0.87 06/30/2016      Objective:   Physical Exam  Constitutional: He is oriented to person, place, and time. He appears well-developed and well-nourished.  HENT:  Head: Normocephalic and atraumatic.  Neck: Normal range of motion. Neck supple. No JVD present.  Cardiovascular: Normal rate and regular rhythm.   Pulmonary/Chest: Effort normal. He has no wheezes. He has no rales.  Abdominal: Soft. He exhibits no distension. There is no tenderness.  Musculoskeletal: He exhibits edema (3+ pitting edema in bilateral lower legs although softer). He exhibits no tenderness.  Neurological: He is alert and oriented to person, place, and time.  Skin: Skin is warm and dry.  Psychiatric: He has a normal mood and affect. His behavior is normal. Thought content normal.  Nursing note and vitals reviewed.     Assessment & Plan:   1: Chronic heart failure with reduced ejection fraction- - NYHA class II - continues to be mildly fluid overloaded today with pedal edema; does try to elevate his legs when he can at home but admits that he works long hours and stands on his feet the whole time at work. 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  - not adding salt to his food at home but does cook with it at work and, again, he's sampling the food every time he is at work cooking. Importance of closely following a 2000mg  sodium diet was reviewed with him.  - trying to eat more fruits - tolerating lisinopril 5mg  daily; fatigue worsened when entresto tried - does not meet ReDS vest criteria due to BMI  2: HTN- - BP looks good today - working days/nights so hasn't been going to the gym as much; emphasized that he has to make exercise a priority  - says that [facility] told him  that he made too much money based on his tax returns  3: Obstructive sleep apnea- - wearing CPAP nightly except when he falls asleep downstairs in the chair. He does say that on those nights he doesn't wear his CPAP that he feels more tired and notices that his edema is worse  4: Tobacco use- - continues to use vape instead of cigarettes - complete cessation of that discussed for 3 minutes with him.   Patient did not bring his medications nor a list. Each medication was verbally reviewed with the patient and he was encouraged to bring the bottles to every visit to confirm accuracy of list.  Return in 3 months or sooner for any questions/problems before then.

## 2017-02-16 NOTE — Patient Instructions (Signed)
Continue weighing daily and call for an overnight weight gain of > 2 pounds or a weekly weight gain of >5 pounds. 

## 2017-05-11 ENCOUNTER — Ambulatory Visit: Payer: Self-pay | Admitting: Family

## 2017-05-13 ENCOUNTER — Telehealth: Payer: Self-pay | Admitting: Pharmacy Technician

## 2017-05-13 NOTE — Telephone Encounter (Signed)
Patient failed to provide 201 tax return.  No additional medication assistance will be provided by [facility] without the required proof of income documentation.  Patient notified by letter.  [name] Care Manager [facility]

## 2017-05-25 ENCOUNTER — Encounter: Payer: Self-pay | Admitting: Family

## 2017-05-25 ENCOUNTER — Ambulatory Visit: Payer: Self-pay | Attending: Family | Admitting: Family

## 2017-05-25 ENCOUNTER — Other Ambulatory Visit: Payer: Self-pay

## 2017-05-25 VITALS — BP 152/90 | HR 120 | Resp 18 | Ht 75.0 in | Wt >= 6400 oz

## 2017-05-25 DIAGNOSIS — Z7984 Long term (current) use of oral hypoglycemic drugs: Secondary | ICD-10-CM | POA: Insufficient documentation

## 2017-05-25 DIAGNOSIS — Z720 Tobacco use: Secondary | ICD-10-CM

## 2017-05-25 DIAGNOSIS — F17210 Nicotine dependence, cigarettes, uncomplicated: Secondary | ICD-10-CM | POA: Insufficient documentation

## 2017-05-25 DIAGNOSIS — J45909 Unspecified asthma, uncomplicated: Secondary | ICD-10-CM | POA: Insufficient documentation

## 2017-05-25 DIAGNOSIS — Z79899 Other long term (current) drug therapy: Secondary | ICD-10-CM | POA: Insufficient documentation

## 2017-05-25 DIAGNOSIS — I110 Hypertensive heart disease with heart failure: Secondary | ICD-10-CM | POA: Insufficient documentation

## 2017-05-25 DIAGNOSIS — E669 Obesity, unspecified: Secondary | ICD-10-CM | POA: Insufficient documentation

## 2017-05-25 DIAGNOSIS — I5022 Chronic systolic (congestive) heart failure: Secondary | ICD-10-CM | POA: Insufficient documentation

## 2017-05-25 DIAGNOSIS — G4733 Obstructive sleep apnea (adult) (pediatric): Secondary | ICD-10-CM | POA: Insufficient documentation

## 2017-05-25 DIAGNOSIS — Z9889 Other specified postprocedural states: Secondary | ICD-10-CM | POA: Insufficient documentation

## 2017-05-25 DIAGNOSIS — I10 Essential (primary) hypertension: Secondary | ICD-10-CM

## 2017-05-25 DIAGNOSIS — E119 Type 2 diabetes mellitus without complications: Secondary | ICD-10-CM | POA: Insufficient documentation

## 2017-05-25 NOTE — Patient Instructions (Signed)
Continue weighing daily and call for an overnight weight gain of > 2 pounds or a weekly weight gain of >5 pounds. 

## 2017-05-25 NOTE — Progress Notes (Signed)
Subjective:    Patient ID: Marc Wright, male    DOB: April 23, 1976, 41 y.o.   MRN: [id number]  HPI  Marc Wright is a 41 y/o male with a history of obstructive sleep apnea (with CPAP), DM, asthma, obesity, tobacco use and chronic heart failure.   Last echo was done 06/26/16 and showed an EF of 25-30% with mild Marc/TR.   Has not been admitted or been in the ED in the last 6 months.    He presents today for his follow-up visit with a chief complaint of chronic edema in lower extremities. He describes this as chronic in nature having been present for several years with varying levels of severity. He has associated head congestion (didn't sleep with CPAP last night) along with this. He denies any fatigue, chest pain, cough, shortness of breath, palpitations, dizziness, difficulty sleeping or weight gain.   Past Medical History:  Diagnosis Date  . Asthma   . CHF (congestive heart failure) (HCC)   . Diabetes mellitus without complication (HCC)   . OSA on CPAP   . Pneumonia 06/2016   Past Surgical History:  Procedure Laterality Date  . I&D EXTREMITY    . SKIN GRAFT     Family History  Problem Relation Age of Onset  . Congestive Heart Failure Mother   . Hypertension Mother    Social History   Tobacco Use  . Smoking status: Current Every Day Smoker    Packs/day: 0.75    Types: Cigarettes  . Smokeless tobacco: Never Used  Substance Use Topics  . Alcohol use: Yes    Comment: occasional    No Known Allergies  Prior to Admission medications   Medication Sig Start Date End Date Taking? Authorizing Provider  B Complex-C (B-COMPLEX WITH VITAMIN C) tablet Take 1 tablet by mouth daily.   Yes [provider]  carvedilol (COREG) 12.5 MG tablet Take 1 tablet (12.5 mg total) by mouth 2 (two) times daily with a meal. 12/11/16  Yes Johncarlos Holtsclaw A, FNP  furosemide (LASIX) 80 MG tablet Take 1 tablet (80 mg total) by mouth 2 (two) times daily. 11/04/16 05/25/17 Yes Niyana Chesbro, Inetta Fermoina A, FNP   lisinopril (PRINIVIL,ZESTRIL) 5 MG tablet Take 1 tablet (5 mg total) by mouth daily. 01/12/17 05/25/17 Yes Delma FreezeHackney, Lorry Anastasi A, FNP  metFORMIN (GLUCOPHAGE) 500 MG tablet Take 1 tablet (500 mg total) by mouth 2 (two) times daily with a meal. 12/11/16  Yes Anthonia Monger A, FNP  multivitamin (ONE-A-DAY MEN'S) TABS tablet Take 1 tablet by mouth daily.   Yes [provider]  vitamin C (ASCORBIC ACID) 500 MG tablet Take 500 mg by mouth daily.   Yes [provider]    Review of Systems  Constitutional: Negative for appetite change and fatigue.  HENT: Positive for congestion. Negative for postnasal drip and sore throat.   Eyes: Negative.   Respiratory: Negative for cough, chest tightness and shortness of breath.   Cardiovascular: Positive for leg swelling. Negative for chest pain and palpitations.  Gastrointestinal: Negative for abdominal distention and abdominal pain.  Endocrine: Negative.   Genitourinary: Negative.   Musculoskeletal: Negative for back pain and neck pain.  Skin: Negative.   Allergic/Immunologic: Negative.   Neurological: Negative for dizziness and light-headedness.  Hematological: Negative for adenopathy. Does not bruise/bleed easily.  Psychiatric/Behavioral: Negative for dysphoric mood, sleep disturbance (wearing CPAP nightly) and suicidal ideas. The patient is not nervous/anxious.    Vitals:   05/25/17 0919  BP: (!) 152/90  Pulse: [name](!)  120  Resp: 18  Weight: (!) 407 lb 2 oz (184.7 kg)  Height: 6\' 3"  (1.905 m)   Wt Readings from Last 3 Encounters:  05/25/17 (!) 407 lb 2 oz (184.7 kg)  02/16/17 (!) 433 lb (196.4 kg)  01/12/17 (!) 450 lb 2 oz (204.2 kg)    Lab Results  Component Value Date   CREATININE 0.76 12/15/2016   CREATININE 0.62 09/22/2016   CREATININE 0.87 06/30/2016      Objective:   Physical Exam  Constitutional: He is oriented to person, place, and time. He appears well-developed and well-nourished.  HENT:  Head: Normocephalic and  atraumatic.  Neck: Normal range of motion. Neck supple. No JVD present.  Cardiovascular: Normal rate and regular rhythm.  Pulmonary/Chest: Effort normal. He has no wheezes. He has no rales.  Abdominal: Soft. He exhibits no distension. There is no tenderness.  Musculoskeletal: He exhibits edema (3+ pitting edema in bilateral lower legs although softer). He exhibits no tenderness.  Neurological: He is alert and oriented to person, place, and time.  Skin: Skin is warm and dry.  Psychiatric: He has a normal mood and affect. His behavior is normal. Thought content normal.  Nursing note and vitals reviewed.     Assessment & Plan:   1: Chronic heart failure with reduced ejection fraction- - NYHA class II - continues to be mildly fluid overloaded today with pedal edema; does try to elevate his legs when he can at home but admits that he works long hours and stands on his feet the whole time at work. 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  - not adding salt to his food at home but does cook with it at work and, again, he's sampling the food every time he is at work cooking. Importance of closely following a 2000mg  sodium diet was reviewed with him.  - trying to eat more fruits - tolerating lisinopril 5mg  daily; fatigue worsened when entresto tried - does not meet ReDS vest criteria due to BMI  2: HTN- - BP mildly elevated although he hasn't taken his medications yet today; will do so as soon as he returns home  3: Obstructive sleep apnea- - wearing CPAP nightly except when he falls asleep downstairs in the chair. He does say that on those nights he doesn't wear his CPAP that he feels more tired and notices that his edema is worse - did not wear his CPAP last night because  he went to visit his brother/dad and spent the night at their house  4: Tobacco use- - continues to use vape instead of cigarettes - complete cessation of that discussed for 3 minutes with him.   Patient did not bring his medications nor a list. Each medication was verbally reviewed with the patient and he was encouraged to bring the bottles to every visit to confirm accuracy of list.  Return in 3 months or sooner for any questions/problems before then.

## 2017-08-01 ENCOUNTER — Other Ambulatory Visit: Payer: Self-pay | Admitting: Family

## 2017-08-01 DIAGNOSIS — I5022 Chronic systolic (congestive) heart failure: Secondary | ICD-10-CM

## 2017-08-21 NOTE — Progress Notes (Signed)
Subjective:    Patient ID: Marc Wright, male    DOB: 1976/03/12, 42 y.o.   MRN: [id number]  HPI  Mr Sham is a 42 y/o male with a history of obstructive sleep apnea (with CPAP), DM, asthma, obesity, tobacco use and chronic heart failure.   Last echo was done 06/26/16 and showed an EF of 25-30% with mild MR/TR.   Has not been admitted or been in the ED in the last 6 months.    He presents today for his follow-up visit with a chief complaint of edema in lower legs. He says that this has been chronic in nature over the last few years. He does feel like his edema has been improving except for today as he stood on his feet yesterday for 12 hours at work. He has associated nasal congestion along with this. He denies any chest pain, palpitations, shortness of breath, abdominal distention, dizziness, difficulty sleeping or weight gain.   Past Medical History:  Diagnosis Date  . Asthma   . CHF (congestive heart failure) (HCC)   . Diabetes mellitus without complication (HCC)   . OSA on CPAP   . Pneumonia 06/2016   Past Surgical History:  Procedure Laterality Date  . I&D EXTREMITY    . SKIN GRAFT     Family History  Problem Relation Age of Onset  . Congestive Heart Failure Mother   . Hypertension Mother    Social History   Tobacco Use  . Smoking status: Current Every Day Smoker    Packs/day: 0.75    Types: Cigarettes    Start date: 11/23/2016  . Smokeless tobacco: Never Used  Substance Use Topics  . Alcohol use: Yes    Comment: occasional    No Known Allergies  Prior to Admission medications   Medication Sig Start Date End Date Taking? Authorizing Provider  B Complex-C (B-COMPLEX WITH VITAMIN C) tablet Take 1 tablet by mouth daily.   Yes [provider]  carvedilol (COREG) 12.5 MG tablet Take 1 tablet (12.5 mg total) by mouth 2 (two) times daily with a meal. 12/11/16  Yes Saryiah Bencosme A, FNP  cetirizine (ZYRTEC) 10 MG tablet Take 10 mg by mouth daily. Takes as needed  (alternating with Allegra)   Yes [provider]  fexofenadine (ALLEGRA) 180 MG tablet Take 180 mg by mouth daily. Taking as needed   Yes [provider]  furosemide (LASIX) 80 MG tablet TAKE 1 TABLET BY MOUTH TWICE DAILY 08/01/17  Yes Clarisa Kindred A, FNP  lisinopril (PRINIVIL,ZESTRIL) 5 MG tablet TAKE 1 TABLET BY MOUTH ONCE DAILY 08/01/17  Yes Clarisa Kindred A, FNP  metFORMIN (GLUCOPHAGE) 500 MG tablet Take 1 tablet (500 mg total) by mouth 2 (two) times daily with a meal. 12/11/16  Yes Tyrel Lex A, FNP  multivitamin (ONE-A-DAY MEN'S) TABS tablet Take 1 tablet by mouth daily.   Yes [provider]  naproxen sodium (ALEVE) 220 MG tablet Take 220 mg by mouth daily as needed.   Yes [provider]  vitamin C (ASCORBIC ACID) 500 MG tablet Take 500 mg by mouth daily. Takes twice a day   Yes [provider]    Review of Systems  Constitutional: Negative for appetite change and fatigue.  HENT: Positive for congestion. Negative for postnasal drip and sore throat.   Eyes: Negative.   Respiratory: Negative for cough, chest tightness and shortness of breath.   Cardiovascular: Positive for leg swelling. Negative for chest pain and palpitations.  Gastrointestinal: Negative for abdominal distention and abdominal pain.  Endocrine: Negative.   Genitourinary: Negative.   Musculoskeletal: Negative for back pain and neck pain.  Skin: Negative.   Allergic/Immunologic: Negative.   Neurological: Negative for dizziness and light-headedness.  Hematological: Negative for adenopathy. Does not bruise/bleed easily.  Psychiatric/Behavioral: Negative for dysphoric mood, sleep disturbance (wearing CPAP nightly) and suicidal ideas. The patient is not nervous/anxious.    Vitals:   08/24/17 0932  BP: 122/80  Pulse: 76  Resp: 18  SpO2: 96%  Weight: (!) 382 lb 6 oz (173.4 kg)  Height: 6\' 3"  (1.905 m)   Wt Readings from Last 3 Encounters:  08/24/17 (!) 382 lb 6 oz (173.4  kg)  05/25/17 (!) 407 lb 2 oz (184.7 kg)  02/16/17 (!) 433 lb (196.4 kg)   Lab Results  Component Value Date   CREATININE 0.76 12/15/2016   CREATININE 0.62 09/22/2016   CREATININE 0.87 06/30/2016       Objective:   Physical Exam  Constitutional: He is oriented to person, place, and time. He appears well-developed and well-nourished.  HENT:  Head: Normocephalic and atraumatic.  Neck: Normal range of motion. Neck supple. No JVD present.  Cardiovascular: Normal rate and regular rhythm.  Pulmonary/Chest: Effort normal. He has no wheezes. He has no rales.  Abdominal: Soft. He exhibits no distension. There is no tenderness.  Musculoskeletal: He exhibits edema (1+ pitting edema in bilateral lower legs although soft). He exhibits no tenderness.  Neurological: He is alert and oriented to person, place, and time.  Skin: Skin is warm and dry.  Psychiatric: He has a normal mood and affect. His behavior is normal. Thought content normal.  Nursing note and vitals reviewed.     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  - not adding salt to his food at home but does cook with it at work and, again, he's sampling the food every time he is at work cooking. Importance of closely following a 2000mg  sodium diet was reviewed with him.  - trying to eat more fruits - tolerating lisinopril 5mg  daily; fatigue worsened when entresto tried - does not meet ReDS vest criteria due to BMI - PharmD reconciled medications with the patient  2: HTN- - BP looks good today - BMP from 12/15/16 reviewed  and showed sodium 137, potassium 3.9 and GFR >60  3: Obstructive sleep apnea- - wearing CPAP nightly except when he falls asleep downstairs in the chair. He does say that on those nights he doesn't wear his CPAP that he feels more tired and notices that his edema is worse  4: Diabetes- - yesterday morning his glucose was 127 - A1c on 06/25/16 was 7.3%  5: Tobacco use- - continues to use vape instead of cigarettes - complete cessation of that discussed for 3 minutes with him.   Medication bottles were reviewed.  Return in 6 months or sooner for any questions/problems before then.

## 2017-08-24 ENCOUNTER — Ambulatory Visit: Payer: Self-pay | Attending: Family | Admitting: Family

## 2017-08-24 ENCOUNTER — Encounter: Payer: Self-pay | Admitting: Family

## 2017-08-24 VITALS — BP 122/80 | HR 76 | Resp 18 | Ht 75.0 in | Wt 382.4 lb

## 2017-08-24 DIAGNOSIS — E119 Type 2 diabetes mellitus without complications: Secondary | ICD-10-CM | POA: Insufficient documentation

## 2017-08-24 DIAGNOSIS — J45909 Unspecified asthma, uncomplicated: Secondary | ICD-10-CM | POA: Insufficient documentation

## 2017-08-24 DIAGNOSIS — G4733 Obstructive sleep apnea (adult) (pediatric): Secondary | ICD-10-CM | POA: Insufficient documentation

## 2017-08-24 DIAGNOSIS — Z9889 Other specified postprocedural states: Secondary | ICD-10-CM | POA: Insufficient documentation

## 2017-08-24 DIAGNOSIS — E669 Obesity, unspecified: Secondary | ICD-10-CM | POA: Insufficient documentation

## 2017-08-24 DIAGNOSIS — Z79899 Other long term (current) drug therapy: Secondary | ICD-10-CM | POA: Insufficient documentation

## 2017-08-24 DIAGNOSIS — Z8249 Family history of ischemic heart disease and other diseases of the circulatory system: Secondary | ICD-10-CM | POA: Insufficient documentation

## 2017-08-24 DIAGNOSIS — Z791 Long term (current) use of non-steroidal anti-inflammatories (NSAID): Secondary | ICD-10-CM | POA: Insufficient documentation

## 2017-08-24 DIAGNOSIS — Z720 Tobacco use: Secondary | ICD-10-CM

## 2017-08-24 DIAGNOSIS — I10 Essential (primary) hypertension: Secondary | ICD-10-CM

## 2017-08-24 DIAGNOSIS — I5022 Chronic systolic (congestive) heart failure: Secondary | ICD-10-CM | POA: Insufficient documentation

## 2017-08-24 DIAGNOSIS — F17210 Nicotine dependence, cigarettes, uncomplicated: Secondary | ICD-10-CM | POA: Insufficient documentation

## 2017-08-24 DIAGNOSIS — I110 Hypertensive heart disease with heart failure: Secondary | ICD-10-CM | POA: Insufficient documentation

## 2017-08-24 DIAGNOSIS — Z7984 Long term (current) use of oral hypoglycemic drugs: Secondary | ICD-10-CM | POA: Insufficient documentation

## 2017-08-24 NOTE — Patient Instructions (Signed)
Continue weighing daily and call for an overnight weight gain of > 2 pounds or a weekly weight gain of >5 pounds. 

## 2018-02-26 NOTE — Progress Notes (Signed)
Subjective:    Patient ID: Marc Wright, male    DOB: [birth year], 42 y.o.   MRN: [id number]  HPI  Marc Wright is a 42 y/o male with a history of obstructive sleep apnea (with CPAP), DM, asthma, obesity, tobacco use and chronic heart failure.   Last echo was done 06/26/16 and showed an EF of 25-30% with mild Marc/TR.   Has not been admitted or been in the ED in the last 6 months.    He presents today for his follow-up visit with a chief complaint of minimal fatigue upon moderate exertion. He describes this as chronic in nature having been present for several years. He does feel like it's a little worse over the last few days as he's been out of his medications. He has associated head congestion and pedal edema along with this. He denies any difficulty sleeping, abdominal distention, palpitations, chest pain, shortness of breath, cough, dizziness or weight gain.   Past Medical History:  Diagnosis Date  . Asthma   . CHF (congestive heart failure) (HCC)   . Diabetes mellitus without complication (HCC)   . OSA on CPAP   . Pneumonia 06/2016   Past Surgical History:  Procedure Laterality Date  . I&D EXTREMITY    . SKIN GRAFT     Family History  Problem Relation Age of Onset  . Congestive Heart Failure Mother   . Hypertension Mother    Social History   Tobacco Use  . Smoking status: Current Every Day Smoker    Packs/day: 0.75    Types: Cigarettes    Start date: 11/23/2016  . Smokeless tobacco: Never Used  Substance Use Topics  . Alcohol use: Yes    Comment: occasional    No Known Allergies  Prior to Admission medications   Medication Sig Start Date End Date Taking? Authorizing Provider  B Complex-C (B-COMPLEX WITH VITAMIN C) tablet Take 1 tablet by mouth daily.   Yes [provider]  CALCIUM/MAGNESIUM/ZINC FORMULA PO Take 1 tablet by mouth daily as needed.   Yes [provider]  carvedilol (COREG) 12.5 MG tablet Take 1 tablet (12.5 mg total) by mouth 2 (two)  times daily with a meal. 03/01/18  Yes Hackney, Tina A, FNP  cetirizine (ZYRTEC) 10 MG tablet Take 10 mg by mouth daily. Takes as needed (alternating with Allegra)   Yes [provider]  fexofenadine (ALLEGRA) 180 MG tablet Take 180 mg by mouth daily. Taking as needed   Yes [provider]  furosemide (LASIX) 80 MG tablet Take 1 tablet (80 mg total) by mouth 2 (two) times daily. 03/01/18  Yes Hackney, [name] A, FNP  lisinopril (PRINIVIL,ZESTRIL) 5 MG tablet Take 1 tablet (5 mg total) by mouth daily. 03/01/18  Yes Delma Freeze, FNP  metFORMIN (GLUCOPHAGE) 500 MG tablet Take 1 tablet (500 mg total) by mouth 2 (two) times daily with a meal. 03/01/18  Yes Hackney, Tina A, FNP  multivitamin (ONE-A-DAY MEN'S) TABS tablet Take 1 tablet by mouth daily.   Yes [provider]  naproxen sodium (ALEVE) 220 MG tablet Take 220 mg by mouth daily as needed.   Yes [provider]  vitamin C (ASCORBIC ACID) 500 MG tablet Take 500 mg by mouth daily. Takes twice a day   Yes [provider]    Review of Systems  Constitutional: Positive for fatigue (over the last couple of days). Negative for appetite change.  HENT: Positive for congestion. Negative for postnasal drip and  sore throat.   Eyes: Negative.   Respiratory: Negative for cough, chest tightness and shortness of breath.   Cardiovascular: Positive for leg swelling. Negative for chest pain and palpitations.  Gastrointestinal: Negative for abdominal distention and abdominal pain.  Endocrine: Negative.   Genitourinary: Negative.   Musculoskeletal: Negative for back pain and neck pain.  Skin: Negative.   Allergic/Immunologic: Negative.   Neurological: Negative for dizziness and light-headedness.  Hematological: Negative for adenopathy. Does not bruise/bleed easily.  Psychiatric/Behavioral: Negative for dysphoric mood, sleep disturbance (wearing CPAP nightly) and suicidal ideas. The patient is not nervous/anxious.     Vitals:   03/01/18 0909  BP: (!) 162/124  Pulse: (!) 102  Resp: 18  SpO2: 95%  Weight: (!) 380 lb (172.4 kg)  Height: 6\' 3"  (1.905 m)   Wt Readings from Last 3 Encounters:  03/01/18 (!) 380 lb (172.4 kg)  08/24/17 (!) 382 lb 6 oz (173.4 kg)  05/25/17 (!) 407 lb 2 oz (184.7 kg)   Lab Results  Component Value Date   CREATININE 0.76 12/15/2016   CREATININE 0.62 09/22/2016   CREATININE 0.87 06/30/2016      Objective:   Physical Exam  Constitutional: He is oriented to person, place, and time. He appears well-developed and well-nourished.  HENT:  Head: Normocephalic and atraumatic.  Neck: Normal range of motion. Neck supple. No JVD present.  Cardiovascular: Normal rate and regular rhythm.  Pulmonary/Chest: Effort normal. He has no wheezes. He has no rales.  Abdominal: Soft. He exhibits no distension. There is no tenderness.  Musculoskeletal: He exhibits edema (1+ pitting edema in bilateral lower legs although soft). He exhibits no tenderness.  Neurological: He is alert and oriented to person, place, and time.  Skin: Skin is warm and dry.  Psychiatric: He has a normal mood and affect. His behavior is normal. Thought content normal.  Nursing note and vitals reviewed.     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  - not adding salt to his food at home but does cook with it at work and, again, he's sampling the food every time he is at work cooking. Importance of closely following a 2000mg  sodium diet was reviewed with him.  - trying to eat more fruits - tolerating lisinopril 5mg  daily; fatigue worsened when  entresto tried - does not meet ReDS vest criteria due to BMI - PharmD reconciled medications with the patient  2: HTN- - BP elevated (162/124) as he says that he's been out of all his medications for the last 4 days and he's been "too busy" to call and get new prescriptions - BP really good last time he was here - BMP from 12/15/16 reviewed and showed sodium 137, potassium 3.9 and GFR >60 - will get a BMP today since it's been >1 year since his labs were last checked  3: Obstructive sleep apnea- - wearing CPAP nightly and feels like he's sleeping well  4: Diabetes- - glucose a couple of days ago was 98 - A1c on 06/25/16 was 7.3%  5: Tobacco use- -  continues to use vape instead of cigarettes - complete cessation of that discussed for 3 minutes with him.   Patient did not bring his medications nor a list. Each medication was verbally reviewed with the patient and he was encouraged to bring the bottles to every visit to confirm accuracy of list.  Return in 6 weeks or sooner for any questions/problems before then.

## 2018-03-01 ENCOUNTER — Encounter: Payer: Self-pay | Admitting: Family

## 2018-03-01 ENCOUNTER — Ambulatory Visit: Payer: Self-pay | Attending: Family | Admitting: Family

## 2018-03-01 VITALS — BP 162/124 | HR 102 | Resp 18 | Ht 75.0 in | Wt 380.0 lb

## 2018-03-01 DIAGNOSIS — E669 Obesity, unspecified: Secondary | ICD-10-CM | POA: Insufficient documentation

## 2018-03-01 DIAGNOSIS — J45909 Unspecified asthma, uncomplicated: Secondary | ICD-10-CM | POA: Insufficient documentation

## 2018-03-01 DIAGNOSIS — Z79899 Other long term (current) drug therapy: Secondary | ICD-10-CM | POA: Insufficient documentation

## 2018-03-01 DIAGNOSIS — F17210 Nicotine dependence, cigarettes, uncomplicated: Secondary | ICD-10-CM | POA: Insufficient documentation

## 2018-03-01 DIAGNOSIS — G4733 Obstructive sleep apnea (adult) (pediatric): Secondary | ICD-10-CM | POA: Insufficient documentation

## 2018-03-01 DIAGNOSIS — Z720 Tobacco use: Secondary | ICD-10-CM

## 2018-03-01 DIAGNOSIS — F17290 Nicotine dependence, other tobacco product, uncomplicated: Secondary | ICD-10-CM | POA: Insufficient documentation

## 2018-03-01 DIAGNOSIS — I10 Essential (primary) hypertension: Secondary | ICD-10-CM

## 2018-03-01 DIAGNOSIS — Z7984 Long term (current) use of oral hypoglycemic drugs: Secondary | ICD-10-CM | POA: Insufficient documentation

## 2018-03-01 DIAGNOSIS — I5022 Chronic systolic (congestive) heart failure: Secondary | ICD-10-CM | POA: Insufficient documentation

## 2018-03-01 DIAGNOSIS — I110 Hypertensive heart disease with heart failure: Secondary | ICD-10-CM | POA: Insufficient documentation

## 2018-03-01 DIAGNOSIS — E119 Type 2 diabetes mellitus without complications: Secondary | ICD-10-CM | POA: Insufficient documentation

## 2018-03-01 LAB — BASIC METABOLIC PANEL
Anion gap: 9 (ref 5–15)
BUN: 16 mg/dL (ref 6–20)
CO2: 28 mmol/L (ref 22–32)
CREATININE: 0.58 mg/dL — AB (ref 0.61–1.24)
Calcium: 9 mg/dL (ref 8.9–10.3)
Chloride: 101 mmol/L (ref 98–111)
GFR calc Af Amer: >60 mL/min (ref >60)
GFR calc non Af Amer: >60 mL/min (ref >60)
Glucose, Bld: 170 mg/dL — ABNORMAL HIGH (ref 70–99)
Potassium: 4.2 mmol/L (ref 3.5–5.1)
SODIUM: 138 mmol/L (ref 135–145)

## 2018-03-01 MED ORDER — CARVEDILOL 12.5 MG PO TABS: 12.5000 mg | ORAL_TABLET | Freq: Two times a day (BID) | ORAL | 3 refills | Status: DC

## 2018-03-01 MED ORDER — METFORMIN HCL 500 MG PO TABS: 500.0000 mg | ORAL_TABLET | Freq: Two times a day (BID) | ORAL | 3 refills | Status: DC

## 2018-03-01 MED ORDER — LISINOPRIL 5 MG PO TABS: 5.0000 mg | ORAL_TABLET | Freq: Every day | ORAL | 3 refills | Status: DC

## 2018-03-01 MED ORDER — FUROSEMIDE 80 MG PO TABS: 80.0000 mg | ORAL_TABLET | Freq: Two times a day (BID) | ORAL | 3 refills | Status: DC

## 2018-03-01 NOTE — Patient Instructions (Signed)
Continue weighing daily and call for an overnight weight gain of > 2 pounds or a weekly weight gain of >5 pounds. 

## 2018-03-07 ENCOUNTER — Other Ambulatory Visit: Payer: Self-pay

## 2018-03-07 ENCOUNTER — Encounter: Payer: Self-pay | Admitting: Emergency Medicine

## 2018-03-07 ENCOUNTER — Emergency Department
Admission: EM | Admit: 2018-03-07 | Discharge: 2018-03-07 | Disposition: A | Discharge status: Home / Self Care | Payer: Self-pay | Attending: Emergency Medicine | Admitting: Emergency Medicine

## 2018-03-07 ENCOUNTER — Emergency Department: Payer: Self-pay

## 2018-03-07 DIAGNOSIS — I5022 Chronic systolic (congestive) heart failure: Secondary | ICD-10-CM | POA: Insufficient documentation

## 2018-03-07 DIAGNOSIS — I110 Hypertensive heart disease with heart failure: Secondary | ICD-10-CM | POA: Insufficient documentation

## 2018-03-07 DIAGNOSIS — F17210 Nicotine dependence, cigarettes, uncomplicated: Secondary | ICD-10-CM | POA: Insufficient documentation

## 2018-03-07 DIAGNOSIS — M79604 Pain in right leg: Secondary | ICD-10-CM | POA: Insufficient documentation

## 2018-03-07 DIAGNOSIS — E119 Type 2 diabetes mellitus without complications: Secondary | ICD-10-CM | POA: Insufficient documentation

## 2018-03-07 DIAGNOSIS — Z79899 Other long term (current) drug therapy: Secondary | ICD-10-CM | POA: Insufficient documentation

## 2018-03-07 DIAGNOSIS — J45909 Unspecified asthma, uncomplicated: Secondary | ICD-10-CM | POA: Insufficient documentation

## 2018-03-07 LAB — BASIC METABOLIC PANEL
Anion gap: 13 (ref 5–15)
BUN: 23 mg/dL — AB (ref 6–20)
CALCIUM: 9.2 mg/dL (ref 8.9–10.3)
CO2: 27 mmol/L (ref 22–32)
CREATININE: 0.78 mg/dL (ref 0.61–1.24)
Chloride: 100 mmol/L (ref 98–111)
GFR calc Af Amer: >60 mL/min (ref >60)
GLUCOSE: 231 mg/dL — AB (ref 70–99)
Potassium: 3.9 mmol/L (ref 3.5–5.1)
Sodium: 140 mmol/L (ref 135–145)

## 2018-03-07 LAB — CBC WITH DIFFERENTIAL/PLATELET
Basophils Absolute: 0.1 10*3/uL (ref 0–0.1)
Basophils Relative: 1 %
EOS ABS: 0.4 10*3/uL (ref 0–0.7)
Eosinophils Relative: 5 %
HEMATOCRIT: 49 % (ref 40.0–52.0)
HEMOGLOBIN: 17 g/dL (ref 13.0–18.0)
LYMPHS ABS: 1.6 10*3/uL (ref 1.0–3.6)
LYMPHS PCT: 22 %
MCH: 29.5 pg (ref 26.0–34.0)
MCHC: 34.7 g/dL (ref 32.0–36.0)
MCV: 85.1 fL (ref 80.0–100.0)
MONOS PCT: 7 %
Monocytes Absolute: 0.5 10*3/uL (ref 0.2–1.0)
NEUTROS PCT: 65 %
Neutro Abs: 4.8 10*3/uL (ref 1.4–6.5)
Platelets: 217 10*3/uL (ref 150–440)
RBC: 5.76 MIL/uL (ref 4.40–5.90)
RDW: 14.8 % — ABNORMAL HIGH (ref 11.5–14.5)
WBC: 7.4 10*3/uL (ref 3.8–10.6)

## 2018-03-07 LAB — FIBRIN DERIVATIVES D-DIMER (ARMC ONLY): Fibrin derivatives D-dimer (ARMC): 316.81 ng/mL (FEU) (ref 0.00–499.00)

## 2018-03-07 MED ORDER — IBUPROFEN 600 MG PO TABS
600.0000 mg | ORAL_TABLET | Freq: Once | ORAL | Status: AC
  Administered 2018-03-07: 600 mg via ORAL
  Filled 2018-03-07: qty 1

## 2018-03-07 NOTE — ED Triage Notes (Signed)
First Nurse Note:  C/O 9 day history of right calf pain.  Seen through Urgent Care Friday morning and was given flexeril.  Pain not improved.

## 2018-03-07 NOTE — Discharge Instructions (Addendum)
Continue the Flexeril as well as ibuprofen as needed.  Rest the leg, elevated, and use ice or a warm compress as needed.  Return to the ER for new, worsening, persistent pain, swelling, rash, or any other new or worsening symptoms that concern he.

## 2018-03-07 NOTE — ED Triage Notes (Signed)
Pt to ED via POV for right calf pain x 9 days. Pt states that pain is waking him up at night and that after being on his leg for about 1 hour the pain is unbearable. Pt states that he has knots in his leg. Pt is in NAD at this time.

## 2018-03-07 NOTE — ED Provider Notes (Signed)
[facility] Emergency Department Provider Note ____________________________________________   None    (approximate)  I have reviewed the triage vital signs and the nursing notes.   HISTORY  Chief Complaint Leg Pain    HPI Marc Wright is a 42 y.o. male with PMH as noted below who presents with right calf pain over the last 9 days, constant but worse with walking, and not improved with Flexeril.  The patient denies any numbness or weakness.  He denies any shortness of breath or chest pain.  No prior history of this symptom.  He denies any known injury or other precipitating factor.  He went to urgent care a few days ago where he was prescribed the Flexeril and was told to come to the ER if it got worse.  Past Medical History:  Diagnosis Date  . Asthma   . CHF (congestive heart failure) (HCC)   . Diabetes mellitus without complication (HCC)   . OSA on CPAP   . Pneumonia 06/2016    Patient Active Problem List   Diagnosis Date Noted  . Chronic systolic heart failure (HCC) 07/23/2016  . HTN (hypertension) 07/23/2016  . Obstructive sleep apnea 07/23/2016  . Tobacco use 07/23/2016  . Diabetes (HCC) 07/23/2016    Past Surgical History:  Procedure Laterality Date  . I&D EXTREMITY    . SKIN GRAFT      Prior to Admission medications   Medication Sig Start Date End Date Taking? Authorizing Provider  B Complex-C (B-COMPLEX WITH VITAMIN C) tablet Take 1 tablet by mouth daily.   Yes [provider]  CALCIUM/MAGNESIUM/ZINC FORMULA PO Take 1 tablet by mouth daily as needed.   Yes [provider]  carvedilol (COREG) 12.5 MG tablet Take 1 tablet (12.5 mg total) by mouth 2 (two) times daily with a meal. 03/01/18  Yes Hackney, Tina A, FNP  cetirizine (ZYRTEC) 10 MG tablet Take 10 mg by mouth daily. Takes as needed (alternating with Allegra)   Yes [provider]  cyclobenzaprine (FLEXERIL) 5 MG tablet Take 5 mg by mouth at bedtime.  03/05/18  Yes [provider]  fexofenadine (ALLEGRA) 180 MG tablet Take 180 mg by mouth daily. Taking as needed   Yes [provider]  furosemide (LASIX) 80 MG tablet Take 1 tablet (80 mg total) by mouth 2 (two) times daily. 03/01/18  Yes Hackney, [name] A, FNP  lisinopril (PRINIVIL,ZESTRIL) 5 MG tablet Take 1 tablet (5 mg total) by mouth daily. 03/01/18  Yes Delma Freeze, FNP  metFORMIN (GLUCOPHAGE) 500 MG tablet Take 1 tablet (500 mg total) by mouth 2 (two) times daily with a meal. 03/01/18  Yes Hackney, Tina A, FNP  multivitamin (ONE-A-DAY MEN'S) TABS tablet Take 1 tablet by mouth daily.   Yes [provider]  naproxen sodium (ALEVE) 220 MG tablet Take 220 mg by mouth daily as needed.   Yes [provider]  vitamin C (ASCORBIC ACID) 500 MG tablet Take 500 mg by mouth daily. Takes twice a day   Yes [provider]    Allergies Patient has no known allergies.  Family History  Problem Relation Age of Onset  . Congestive Heart Failure Mother   . Hypertension Mother     Social History Social History   Tobacco Use  . Smoking status: Current Every Day Smoker    Packs/day: 0.75    Types: Cigarettes    Start date: 11/23/2016  . Smokeless tobacco: Never Used  Substance Use Topics  .  Alcohol use: Yes    Comment: occasional   . Drug use: No    Review of Systems  Constitutional: No fever. Eyes: No redness. ENT: No sore throat. Cardiovascular: Denies chest pain. Respiratory: Denies shortness of breath. Gastrointestinal: No vomiting.  Genitourinary: Negative for flank pain.  Musculoskeletal: Negative for back pain.  Positive for right calf pain. Skin: Negative for rash. Neurological: Negative for headache.   ____________________________________________   PHYSICAL EXAM:  VITAL SIGNS: ED Triage Vitals  Enc Vitals Group     BP 03/07/18 1025 (!) 135/98     Pulse Rate 03/07/18 1023 94     Resp 03/07/18 1023 16     Temp 03/07/18 1023 98  F (36.7 C)     Temp Source 03/07/18 1023 Oral     SpO2 03/07/18 1023 95 %     Weight 03/07/18 1023 (!) 380 lb (172.4 kg)     Height --      Head Circumference --      Peak Flow --      Pain Score 03/07/18 1023 8     Pain Loc --      Pain Edu? --      Excl. in GC? --     Constitutional: Alert and oriented. Well appearing and in no acute distress. Eyes: Conjunctivae are normal.  Head: Atraumatic. Nose: No congestion/rhinnorhea. Mouth/Throat: Mucous membranes are moist.   Neck: Normal range of motion.  Cardiovascular: Good peripheral circulation. Respiratory: Normal respiratory effort.  Gastrointestinal: No distention.  Musculoskeletal: No lower extremity edema.  Extremities warm and well perfused.  Posterior right calf tenderness with no swelling.  No peripheral edema.   Neurologic:  Normal speech and language. No gross focal neurologic deficits are appreciated.  Skin:  Skin is warm and dry. No rash noted. Psychiatric: Mood and affect are normal. Speech and behavior are normal.  ____________________________________________   LABS (all labs ordered are listed, but only abnormal results are displayed)  Labs Reviewed  BASIC METABOLIC PANEL - Abnormal; Notable for the following components:      Result Value   Glucose, Bld 231 (*)    BUN 23 (*)    All other components within normal limits  CBC WITH DIFFERENTIAL/PLATELET - Abnormal; Notable for the following components:   RDW 14.8 (*)    All other components within normal limits  FIBRIN DERIVATIVES D-DIMER ([facility] ONLY)   ____________________________________________  EKG   ____________________________________________  RADIOLOGY  [place] RLE: No acute DVT  ____________________________________________   PROCEDURES  Procedure(s) performed: No  Procedures  Critical Care performed: No ____________________________________________   INITIAL IMPRESSION / ASSESSMENT AND PLAN / ED COURSE  Pertinent labs & imaging results  that were available during my care of the patient were reviewed by me and considered in my medical decision making (see chart for details).  41 year old male presents with atraumatic right calf pain over approximately the last week.  No significant associated symptoms.  He was diagnosed with a likely muscle strain at urgent care a few days ago, but states Flexeril is minimally helpful.  On exam, he is well-appearing and his vital signs are normal.  There is some posterior right calf tenderness although no significant swelling.  I suspect overall most likely muscle strain/spasm, however differential also includes DVT.  There is no evidence of cellulitis or other infectious cause.  We will obtain labs, ultrasound, and reassess.  ----------------------------------------- 1:28 PM on 03/07/2018 -----------------------------------------  Ultrasound and labs are within normal limits.  D-dimer is  negative.  No evidence of DVT.  Presentation is consistent with muscle strain/spasm.  I counseled the patient on the results of the work-up.  Return precautions given, and he expressed understanding. ____________________________________________   FINAL CLINICAL IMPRESSION(S) / ED DIAGNOSES  Final diagnoses:  Right leg pain      NEW MEDICATIONS STARTED DURING THIS VISIT:  New Prescriptions   No medications on file     Note:  This document was prepared using Dragon voice recognition software and may include unintentional dictation errors.    Dionne Bucy, MD 03/07/18 1329

## 2018-03-09 ENCOUNTER — Encounter (HOSPITAL_COMMUNITY): Payer: Self-pay | Admitting: Emergency Medicine

## 2018-03-09 ENCOUNTER — Emergency Department (HOSPITAL_COMMUNITY)
Admission: EM | Admit: 2018-03-09 | Discharge: 2018-03-09 | Disposition: A | Discharge status: Home / Self Care | Payer: Self-pay | Attending: Emergency Medicine | Admitting: Emergency Medicine

## 2018-03-09 ENCOUNTER — Other Ambulatory Visit: Payer: Self-pay

## 2018-03-09 ENCOUNTER — Emergency Department (HOSPITAL_COMMUNITY): Payer: Self-pay

## 2018-03-09 DIAGNOSIS — I5022 Chronic systolic (congestive) heart failure: Secondary | ICD-10-CM | POA: Insufficient documentation

## 2018-03-09 DIAGNOSIS — R600 Localized edema: Secondary | ICD-10-CM | POA: Insufficient documentation

## 2018-03-09 DIAGNOSIS — Z79899 Other long term (current) drug therapy: Secondary | ICD-10-CM | POA: Insufficient documentation

## 2018-03-09 DIAGNOSIS — I110 Hypertensive heart disease with heart failure: Secondary | ICD-10-CM | POA: Insufficient documentation

## 2018-03-09 DIAGNOSIS — E119 Type 2 diabetes mellitus without complications: Secondary | ICD-10-CM | POA: Insufficient documentation

## 2018-03-09 DIAGNOSIS — M79604 Pain in right leg: Secondary | ICD-10-CM | POA: Insufficient documentation

## 2018-03-09 DIAGNOSIS — Z7984 Long term (current) use of oral hypoglycemic drugs: Secondary | ICD-10-CM | POA: Insufficient documentation

## 2018-03-09 DIAGNOSIS — F17210 Nicotine dependence, cigarettes, uncomplicated: Secondary | ICD-10-CM | POA: Insufficient documentation

## 2018-03-09 LAB — CBC WITH DIFFERENTIAL/PLATELET
ABS IMMATURE GRANULOCYTES: 0 10*3/uL (ref 0.0–0.1)
BASOS ABS: 0.1 10*3/uL (ref 0.0–0.1)
BASOS PCT: 1 %
Eosinophils Absolute: 0.4 10*3/uL (ref 0.0–0.7)
Eosinophils Relative: 4 %
HCT: 51.8 % (ref 39.0–52.0)
HEMOGLOBIN: 17 g/dL (ref 13.0–17.0)
Immature Granulocytes: 0 %
LYMPHS PCT: 29 %
Lymphs Abs: 2.7 10*3/uL (ref 0.7–4.0)
MCH: 28.3 pg (ref 26.0–34.0)
MCHC: 32.8 g/dL (ref 30.0–36.0)
MCV: 86.3 fL (ref 78.0–100.0)
Monocytes Absolute: 0.8 10*3/uL (ref 0.1–1.0)
Monocytes Relative: 9 %
NEUTROS ABS: 5.2 10*3/uL (ref 1.7–7.7)
NEUTROS PCT: 57 %
PLATELETS: 233 10*3/uL (ref 150–400)
RBC: 6 MIL/uL — AB (ref 4.22–5.81)
RDW: 13.6 % (ref 11.5–15.5)
WBC: 9.2 10*3/uL (ref 4.0–10.5)

## 2018-03-09 LAB — BASIC METABOLIC PANEL
Anion gap: 13 (ref 5–15)
BUN: 22 mg/dL — ABNORMAL HIGH (ref 6–20)
CHLORIDE: 95 mmol/L — AB (ref 98–111)
CO2: 31 mmol/L (ref 22–32)
CREATININE: 0.92 mg/dL (ref 0.61–1.24)
Calcium: 10.6 mg/dL — ABNORMAL HIGH (ref 8.9–10.3)
GFR calc non Af Amer: >60 mL/min (ref >60)
Glucose, Bld: 180 mg/dL — ABNORMAL HIGH (ref 70–99)
POTASSIUM: 4.2 mmol/L (ref 3.5–5.1)
Sodium: 139 mmol/L (ref 135–145)

## 2018-03-09 LAB — CK: CK TOTAL: 29 U/L — AB (ref 49–397)

## 2018-03-09 LAB — MAGNESIUM: MAGNESIUM: 1.8 mg/dL (ref 1.7–2.4)

## 2018-03-09 MED ORDER — HYDROCODONE-ACETAMINOPHEN 5-325 MG PO TABS: 1.0000 | ORAL_TABLET | Freq: Four times a day (QID) | ORAL | 0 refills | Status: DC | PRN

## 2018-03-09 MED ORDER — OXYCODONE-ACETAMINOPHEN 5-325 MG PO TABS
1.0000 | ORAL_TABLET | Freq: Once | ORAL | Status: AC
  Administered 2018-03-09: 1 via ORAL
  Filled 2018-03-09: qty 1

## 2018-03-09 MED ORDER — DICLOFENAC SODIUM 1 % TD GEL: 2.0000 g | Freq: Four times a day (QID) | TRANSDERMAL | 0 refills | Status: DC

## 2018-03-09 NOTE — ED Triage Notes (Signed)
Pt arrives to ED for right lower leg pain pain is in calf and in right ankle. Pt had recent US on 9/15 at [facility]- negative for blood clot. Pt has been taking 5 200mg  motrins 3 times a day for pain.

## 2018-03-09 NOTE — Discharge Instructions (Addendum)
Please see the information and instructions below regarding your visit.  Your diagnoses today include:  1. Right leg pain     Tests performed today include: See side panel of your discharge paperwork for testing performed today. Vital signs are listed at the bottom of these instructions.   All of your work-up is reassuring today.  Is not suggestive of infection.  If you are still having pain at 7 days, you will need a repeat vascular ultrasound to rule out DVT.  This would be around 03-14-2018.  Medications prescribed:    Take any prescribed medications only as prescribed, and any over the counter medications only as directed on the packaging.  Please apply diclofenac gel on the Up to 4 times daily.  You have been prescribed Norco for pain. This is an opioid pain medication. You may take this medication every 4-6 hours as needed for pain. Only take this medication if you need it for breakthrough pain.   Do not combine this medication with Tylenol, as it may increase the risk of liver problems.  Do not combine this medication with alcohol.  Please be advised to avoid driving or operating heavy machinery while taking this medication, as it may make you drowsy or impair judgment.    Home care instructions:  Please follow any educational materials contained in this packet.   Follow-up instructions: Please follow-up with your primary care provider in 5-7 days for further evaluation of your symptoms if they are not completely improved.   Please follow up with orthopedics as soon as possible.  Return instructions:  Please return to the Emergency Department if you experience worsening symptoms.  Return to the emergency department for any worsening pain, swelling of the leg, change in color of the leg, or fever or chills. Return for any chest pain or shortness of breath. Please return if you have any other emergent concerns.  Additional Information:   Your vital signs today  were: BP 116/85    Pulse 100    Temp 98.7 F (37.1 C) (Oral)    Resp 14    SpO2 99%  If your blood pressure (BP) was elevated on multiple readings during this visit above 130 for the top number or above 80 for the bottom number, please have this repeated by your primary care provider within one month. --------------  Thank you for allowing [place] to participate in your care today.

## 2018-03-09 NOTE — ED Provider Notes (Addendum)
[facility] EMERGENCY DEPARTMENT Provider Note   CSN: [id number] Arrival date & time: 03/09/18  5277     History   Chief Complaint Chief Complaint  Patient presents with  . Leg Pain    HPI Marc Wright is a 42 y.o. male.  HPI  Patient is a 42 year old male with a history of heart failure reduced ejection fraction, type 2 diabetes mellitus, asthma, morbid obesity presenting for right lower extremity pain.  Patient reports he has had 11 days of calf and shin tenderness on the right.  Patient reports that he does not remember the inciting event when it first occurred, but it has been constant and has kept him up at night.  Patient reports he first presented to urgent care, prescribed him Flexeril for a musculoskeletal problem, followed by an emergency department visit where he had a negative vascular ultrasound for DVT.  Patient denies any fevers, chills, swelling of the lower extremity, pallor, or erythema.  Patient denies any known injury to the lower extremity.  Patient denies any history of surgery or trauma to the right lower extremity.  Patient denies any history of similar pain.  Past Medical History:  Diagnosis Date  . Asthma   . CHF (congestive heart failure) (HCC)   . Diabetes mellitus without complication (HCC)   . OSA on CPAP   . Pneumonia 06/2016    Patient Active Problem List   Diagnosis Date Noted  . Chronic systolic heart failure (HCC) 07/23/2016  . HTN (hypertension) 07/23/2016  . Obstructive sleep apnea 07/23/2016  . Tobacco use 07/23/2016  . Diabetes (HCC) 07/23/2016    Past Surgical History:  Procedure Laterality Date  . I&D EXTREMITY    . SKIN GRAFT          Home Medications    Prior to Admission medications   Medication Sig Start Date End Date Taking? Authorizing Provider  B Complex-C (B-COMPLEX WITH VITAMIN C) tablet Take 1 tablet by mouth daily.    [provider]  CALCIUM/MAGNESIUM/ZINC FORMULA PO Take 1 tablet  by mouth daily as needed.    [provider]  carvedilol (COREG) 12.5 MG tablet Take 1 tablet (12.5 mg total) by mouth 2 (two) times daily with a meal. 03/01/18   Delma Freeze, FNP  cetirizine (ZYRTEC) 10 MG tablet Take 10 mg by mouth daily. Takes as needed (alternating with Allegra)    [provider]  cyclobenzaprine (FLEXERIL) 5 MG tablet Take 5 mg by mouth at bedtime. 03/05/18   [provider]  fexofenadine (ALLEGRA) 180 MG tablet Take 180 mg by mouth daily. Taking as needed    [provider]  furosemide (LASIX) 80 MG tablet Take 1 tablet (80 mg total) by mouth 2 (two) times daily. 03/01/18   Delma Freeze, FNP  lisinopril (PRINIVIL,ZESTRIL) 5 MG tablet Take 1 tablet (5 mg total) by mouth daily. 03/01/18   Delma Freeze, FNP  metFORMIN (GLUCOPHAGE) 500 MG tablet Take 1 tablet (500 mg total) by mouth 2 (two) times daily with a meal. 03/01/18   Delma Freeze, FNP  multivitamin (ONE-A-DAY MEN'S) TABS tablet Take 1 tablet by mouth daily.    [provider]  naproxen sodium (ALEVE) 220 MG tablet Take 220 mg by mouth daily as needed.    [provider]  vitamin C (ASCORBIC ACID) 500 MG tablet Take 500 mg by mouth daily. Takes twice a day    [provider]    Family  History Family History  Problem Relation Age of Onset  . Congestive Heart Failure Mother   . Hypertension Mother     Social History Social History   Tobacco Use  . Smoking status: Current Every Day Smoker    Packs/day: 0.75    Types: Cigarettes    Start date: 11/23/2016  . Smokeless tobacco: Never Used  Substance Use Topics  . Alcohol use: Yes    Comment: occasional   . Drug use: No     Allergies   Patient has no known allergies.   Review of Systems Review of Systems  Constitutional: Negative for chills and fever.  HENT: Negative for congestion and sore throat.   Respiratory: Negative for cough, chest tightness and shortness of breath.     Cardiovascular: Negative for chest pain, palpitations and leg swelling.  Gastrointestinal: Negative for abdominal pain, nausea and vomiting.  Genitourinary: Negative for dysuria and flank pain.  Musculoskeletal: Positive for arthralgias and myalgias. Negative for joint swelling.  Skin: Negative for rash.  Allergic/Immunologic: Negative for immunocompromised state.  Neurological: Negative for dizziness and syncope.     Physical Exam Updated Vital Signs BP (!) 117/92   Pulse 95   Temp 98.7 F (37.1 C) (Oral)   Resp 14   SpO2 94%   Physical Exam  Constitutional: He appears well-developed and well-nourished. No distress.  HENT:  Head: Normocephalic and atraumatic.  Mouth/Throat: Oropharynx is clear and moist.  Eyes: Pupils are equal, round, and reactive to light. Conjunctivae and EOM are normal.  Neck: Normal range of motion. Neck supple.  Cardiovascular: Normal rate, regular rhythm, S1 normal and S2 normal.  No murmur heard. Pulmonary/Chest: Effort normal and breath sounds normal. He has no wheezes. He has no rales.  Abdominal: Soft. He exhibits no distension. There is no tenderness. There is no guarding.  Musculoskeletal: Normal range of motion. He exhibits edema. He exhibits no deformity.  Bilateral nonpitting LE edema, symmetric bilaterally. Patient exhibits right calf tenderness, as well as tenderness palpation of medial and lateral tibia of the right lower extremity.  No tenderness to palpation of the forefoot or midfoot.  Patient is able to perform active range of motion of flexion, extension, inversion, and eversion of the right ankle. Pulses in bilateral lower extremities obtained with Doppler and are present and strong.   Neurological: He is alert.  Cranial nerves grossly intact. Patient moves extremities symmetrically and with good coordination.  Skin: Skin is warm and dry. No rash noted. No erythema.  Psychiatric: He has a normal mood and affect. His behavior is  normal. Judgment and thought content normal.  Nursing note and vitals reviewed.    ED Treatments / Results  Labs (all labs ordered are listed, but only abnormal results are displayed) Labs Reviewed  BASIC METABOLIC PANEL - Abnormal; Notable for the following components:      Result Value   Chloride 95 (*)    Glucose, Bld 180 (*)    BUN 22 (*)    Calcium 10.6 (*)    All other components within normal limits  CK - Abnormal; Notable for the following components:   Total CK 29 (*)    All other components within normal limits  CBC WITH DIFFERENTIAL/PLATELET - Abnormal; Notable for the following components:   RBC 6.00 (*)    All other components within normal limits  MAGNESIUM  CBC WITH DIFFERENTIAL/PLATELET    EKG None  Radiology Dg Tibia/fibula Right  Result Date: 03/09/2018 CLINICAL DATA:  Pain  EXAM: RIGHT TIBIA AND FIBULA - 2 VIEW COMPARISON:  None. FINDINGS: Frontal and lateral views were obtained. There is no fracture or dislocation. No knee or ankle joint effusion. Joint spaces appear unremarkable. IMPRESSION: No fracture or dislocation.  No evident arthropathy. Electronically Signed   By: Bretta Bang III M.D.   On: 03/09/2018 14:24   Dg Ankle Complete Right  Result Date: 03/09/2018 CLINICAL DATA:  Pain EXAM: RIGHT ANKLE - COMPLETE 3+ VIEW COMPARISON:  None. FINDINGS: Frontal, oblique, and lateral views were obtained. There is generalized soft tissue swelling. No evident fracture or joint effusion. No appreciable joint space narrowing or erosion. There is mild spurring in the dorsal talonavicular region. There is a spur arising from the inferior calcaneus. Ankle mortise appears intact. IMPRESSION: Mild soft tissue swelling. No fracture or appreciable joint space narrowing. Inferior calcaneal spur. Mild spurring talonavicular joint. Ankle mortise appears intact. Electronically Signed   By: Bretta Bang III M.D.   On: 03/09/2018 14:25    Procedures Procedures  (including critical care time)  Medications Ordered in ED Medications  oxyCODONE-acetaminophen (PERCOCET/ROXICET) 5-325 MG per tablet 1 tablet (has no administration in time range)     Initial Impression / Assessment and Plan / ED Course  I have reviewed the triage vital signs and the nursing notes.  Pertinent labs & imaging results that were available during my care of the patient were reviewed by me and considered in my medical decision making (see chart for details).  Clinical Course as of Mar 10 2331  Tue Mar 09, 2018  1631 Reassessed patient.  Feeling well.  Not tachycardic.  Will plan for follow-up with both primary care and orthopedics.  Discussed repeat vascular ultrasound at 7 days if still having pain to rule out DVT definitively.   [AM]    Clinical Course User Index [AM] Elisha Ponder, PA-C    Patient is nontoxic-appearing, afebrile, and in no acute distress at rest.  Differential diagnosis includes PAD, osteomyelitis, skin and soft tissue infection, acute muscle breakdown, tendinitis, muscular spasming.  Low suspicion for DVT given recent negative study. Patient not having chest pain or shortness of breath.  Patient's work-up without acute findings requiring surgical or medical intervention, or admission at this time.  Arterial occlusion ruled out with strong pulses on Doppler.  X-ray without any soft tissue gas or any obvious osseous lesions.  Patient has no leukocytosis.  Given time course of symptoms, feel that patient is stable for outpatient orthopedic work-up for possible tenosynovitis versus tendinitis.  Given patient's difficulty with sleeping, and limited ability to use NSAIDs due to his heart failure, will prescribe short course of Norco, primarily for sleep.  Patient was also instructed that in the event that he is continuing to have pain 7 days after his DVT study on 03-07-2018, he will need a repeat ultrasound to fully rule out DVT.  I have reviewed the  patient's information in the [place] Controlled Substance Database for the past 12 months and found them to have no Rx on record.  Opiates were prescribed for an acute, painful condition. The patient was given information on side effects and encouraged to use other, non-opiate pain medication primary, only using opiate medicine sparingly for severe pain.  This is a supervised visit with Dr. Marily Memos. Evaluation, management, and disposition planning discussed with this attending physician.  Final Clinical Impressions(s) / ED Diagnoses   Final diagnoses:  Right leg pain    ED Discharge Orders  Ordered    diclofenac sodium (VOLTAREN) 1 % GEL  4 times daily     03/09/18 1634    HYDROcodone-acetaminophen (NORCO/VICODIN) 5-325 MG tablet  Every 6 hours PRN     03/09/18 1634            Delia Chimes 03/09/18 2346    Mesner, Barbara Cower, MD 03/10/18 2348

## 2018-03-09 NOTE — ED Notes (Signed)
Patient transported to X-ray 

## 2018-03-09 NOTE — ED Notes (Signed)
Pt remains at xray

## 2018-03-25 ENCOUNTER — Other Ambulatory Visit: Payer: Self-pay

## 2018-03-25 DIAGNOSIS — M79604 Pain in right leg: Secondary | ICD-10-CM

## 2018-04-11 NOTE — Progress Notes (Deleted)
Subjective:    Patient ID: Marc Wright, male    DOB: Nov 13, 1975, 42 y.o.   MRN: [id number]  HPI  Marc Wright is a 42 y/o male with a history of obstructive sleep apnea (with CPAP), DM, asthma, obesity, tobacco use and chronic heart failure.   Last echo was done 06/26/16 and showed an EF of 25-30% with mild Marc/TR.   Was in the ED 03/09/18 due to right leg pain for last 11 days. Xray was negative and he was released. Was in the ED 03/07/18 due to right leg pain. Negative for DVT. Treated and released.     He presents today for his follow-up visit with a chief complaint of   Past Medical History:  Diagnosis Date  . Asthma   . CHF (congestive heart failure) (HCC)   . Diabetes mellitus without complication (HCC)   . OSA on CPAP   . Pneumonia 06/2016   Past Surgical History:  Procedure Laterality Date  . I&D EXTREMITY    . SKIN GRAFT     Family History  Problem Relation Age of Onset  . Congestive Heart Failure Mother   . Hypertension Mother    Social History   Tobacco Use  . Smoking status: Current Every Day Smoker    Packs/day: 0.75    Types: Cigarettes    Start date: 11/23/2016  . Smokeless tobacco: Never Used  Substance Use Topics  . Alcohol use: Yes    Comment: occasional    No Known Allergies    Review of Systems  Constitutional: Positive for fatigue (over the last couple of days). Negative for appetite change.  HENT: Positive for congestion. Negative for postnasal drip and sore throat.   Eyes: Negative.   Respiratory: Negative for cough, chest tightness and shortness of breath.   Cardiovascular: Positive for leg swelling. Negative for chest pain and palpitations.  Gastrointestinal: Negative for abdominal distention and abdominal pain.  Endocrine: Negative.   Genitourinary: Negative.   Musculoskeletal: Negative for back pain and neck pain.  Skin: Negative.   Allergic/Immunologic: Negative.   Neurological: Negative for dizziness and light-headedness.   Hematological: Negative for adenopathy. Does not bruise/bleed easily.  Psychiatric/Behavioral: Negative for dysphoric mood, sleep disturbance (wearing CPAP nightly) and suicidal ideas. The patient is not nervous/anxious.       Objective:   Physical Exam  Constitutional: He is oriented to person, place, and time. He appears well-developed and well-nourished.  HENT:  Head: Normocephalic and atraumatic.  Neck: Normal range of motion. Neck supple. No JVD present.  Cardiovascular: Normal rate and regular rhythm.  Pulmonary/Chest: Effort normal. He has no wheezes. He has no rales.  Abdominal: Soft. He exhibits no distension. There is no tenderness.  Musculoskeletal: He exhibits edema (1+ pitting edema in bilateral lower legs although soft). He exhibits no tenderness.  Neurological: He is alert and oriented to person, place, and time.  Skin: Skin is warm and dry.  Psychiatric: He has a normal mood and affect. His behavior is normal. Thought content normal.  Nursing note and vitals reviewed.     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  - not adding salt to his food at home but does cook with it at work and, again, he's sampling the food every time he is at work cooking. Importance of closely following a 2000mg  sodium diet was reviewed with him.  - trying to eat more fruits - tolerating lisinopril 5mg  daily; fatigue worsened when entresto tried - does not meet ReDS vest criteria due to BMI - PharmD reconciled medications with the patient  2: HTN- - BP  -  - BMP from 03/09/18 reviewed and showed sodium 139, potassium 4.2, creatinine 0.92 and GFR >60   3: Obstructive sleep  apnea- - wearing CPAP nightly and feels like he's sleeping well  4: Diabetes- - glucose a couple of days ago was  - A1c on 06/25/16 was 7.3%  5: Tobacco use- - continues to use vape instead of cigarettes - complete cessation of that discussed for 3 minutes with him.   Patient did not bring his medications nor a list. Each medication was verbally reviewed with the patient and he was encouraged to bring the bottles to every visit to confirm accuracy of list.

## 2018-04-12 ENCOUNTER — Ambulatory Visit: Payer: Self-pay | Admitting: Family

## 2018-04-19 ENCOUNTER — Encounter: Payer: Self-pay | Admitting: Family

## 2018-04-19 ENCOUNTER — Ambulatory Visit: Payer: Self-pay | Attending: Family | Admitting: Family

## 2018-04-19 VITALS — BP 144/115 | HR 114 | Resp 18 | Ht 75.0 in | Wt 376.5 lb

## 2018-04-19 DIAGNOSIS — Z7984 Long term (current) use of oral hypoglycemic drugs: Secondary | ICD-10-CM | POA: Insufficient documentation

## 2018-04-19 DIAGNOSIS — Z8249 Family history of ischemic heart disease and other diseases of the circulatory system: Secondary | ICD-10-CM | POA: Insufficient documentation

## 2018-04-19 DIAGNOSIS — J45909 Unspecified asthma, uncomplicated: Secondary | ICD-10-CM | POA: Insufficient documentation

## 2018-04-19 DIAGNOSIS — E669 Obesity, unspecified: Secondary | ICD-10-CM | POA: Insufficient documentation

## 2018-04-19 DIAGNOSIS — I10 Essential (primary) hypertension: Secondary | ICD-10-CM

## 2018-04-19 DIAGNOSIS — E119 Type 2 diabetes mellitus without complications: Secondary | ICD-10-CM | POA: Insufficient documentation

## 2018-04-19 DIAGNOSIS — M25561 Pain in right knee: Secondary | ICD-10-CM | POA: Insufficient documentation

## 2018-04-19 DIAGNOSIS — I5022 Chronic systolic (congestive) heart failure: Secondary | ICD-10-CM | POA: Insufficient documentation

## 2018-04-19 DIAGNOSIS — I110 Hypertensive heart disease with heart failure: Secondary | ICD-10-CM | POA: Insufficient documentation

## 2018-04-19 DIAGNOSIS — Z79899 Other long term (current) drug therapy: Secondary | ICD-10-CM | POA: Insufficient documentation

## 2018-04-19 DIAGNOSIS — G4733 Obstructive sleep apnea (adult) (pediatric): Secondary | ICD-10-CM | POA: Insufficient documentation

## 2018-04-19 DIAGNOSIS — F17210 Nicotine dependence, cigarettes, uncomplicated: Secondary | ICD-10-CM | POA: Insufficient documentation

## 2018-04-19 NOTE — Progress Notes (Signed)
Subjective:    Patient ID: Marc Wright, male    DOB: 1976-06-14, 42 y.o.   MRN: [id number]  HPI  Marc Wright is a 42 y/o male with a history of obstructive sleep apnea (with CPAP), DM, asthma, obesity, tobacco use and chronic heart failure.   Last echo was done 06/26/16 and showed an EF of 25-30% with mild Marc/TR.   Was in the ED 03/09/18 due to right leg pain for last 11 days. Xray was negative and he was released. Was in the ED 03/07/18 due to right leg pain. Negative for DVT. Treated and released.     He presents today for his follow-up visit with a chief complaint of minimal fatigue upon moderate exertion. He describes this as chronic in nature having been present for several years. He has associated head congestion and right knee pain along with this. He denies any difficulty sleeping, abdominal distention, palpitations, pedal edema, chest pain, shortness of breath, cough, dizziness or weight gain.   Past Medical History:  Diagnosis Date  . Asthma   . CHF (congestive heart failure) (HCC)   . Diabetes mellitus without complication (HCC)   . OSA on CPAP   . Pneumonia 06/2016   Past Surgical History:  Procedure Laterality Date  . I&D EXTREMITY    . SKIN GRAFT     Family History  Problem Relation Age of Onset  . Congestive Heart Failure Mother   . Hypertension Mother    Social History   Tobacco Use  . Smoking status: Current Every Day Smoker    Packs/day: 0.75    Types: Cigarettes    Start date: 11/23/2016  . Smokeless tobacco: Never Used  Substance Use Topics  . Alcohol use: Yes    Comment: occasional    No Known Allergies  Prior to Admission medications   Medication Sig Start Date End Date Taking? Authorizing Provider  B Complex-C (B-COMPLEX WITH VITAMIN C) tablet Take 1 tablet by mouth daily.   Yes [provider]  carvedilol (COREG) 12.5 MG tablet Take 1 tablet (12.5 mg total) by mouth 2 (two) times daily with a meal. 03/01/18  Yes Lisel Siegrist A, FNP   fexofenadine (ALLEGRA) 180 MG tablet Take 180 mg by mouth daily. Taking as needed   Yes [provider]  furosemide (LASIX) 80 MG tablet Take 1 tablet (80 mg total) by mouth 2 (two) times daily. 03/01/18  Yes Yazleemar Strassner, [name] A, FNP  lisinopril (PRINIVIL,ZESTRIL) 5 MG tablet Take 1 tablet (5 mg total) by mouth daily. 03/01/18  Yes Delma Freeze, FNP  metFORMIN (GLUCOPHAGE) 500 MG tablet Take 1 tablet (500 mg total) by mouth 2 (two) times daily with a meal. 03/01/18  Yes Shabazz Mckey A, FNP  multivitamin (ONE-A-DAY MEN'S) TABS tablet Take 1 tablet by mouth daily.   Yes [provider]  naproxen sodium (ALEVE) 220 MG tablet Take 220 mg by mouth daily as needed.   Yes [provider]  CALCIUM/MAGNESIUM/ZINC FORMULA PO Take 1 tablet by mouth daily as needed.    [provider]  diclofenac sodium (VOLTAREN) 1 % GEL Apply 2 g topically 4 (four) times daily. Patient not taking: Reported on 04/19/2018 03/09/18   Elisha Ponder, PA-C  niacin 250 MG tablet Take 250 mg by mouth at bedtime.    [provider]  vitamin C (ASCORBIC ACID) 500 MG tablet Take 500 mg by mouth daily. Takes twice a day    [provider]  Review of Systems  Constitutional: Positive for fatigue. Negative for appetite change.  HENT: Positive for congestion. Negative for postnasal drip and sore throat.   Eyes: Negative.   Respiratory: Negative for cough, chest tightness and shortness of breath.   Cardiovascular: Negative for chest pain, palpitations and leg swelling.  Gastrointestinal: Negative for abdominal distention and abdominal pain.  Endocrine: Negative.   Genitourinary: Negative.   Musculoskeletal: Positive for arthralgias (right knee/ankle pain in the mornings). Negative for back pain and neck pain.  Skin: Negative.   Allergic/Immunologic: Negative.   Neurological: Negative for dizziness and light-headedness.  Hematological: Negative for adenopathy. Does not  bruise/bleed easily.  Psychiatric/Behavioral: Negative for dysphoric mood, sleep disturbance (wearing CPAP nightly) and suicidal ideas. The patient is not nervous/anxious.       Vitals:   04/19/18 1303  BP: (!) 144/115  Pulse: (!) 114  Resp: 18  SpO2: 97%  Weight: (!) 376 lb 8 oz (170.8 kg)  Height: 6\' 3"  (1.905 m)   Wt Readings from Last 3 Encounters:  04/19/18 (!) 376 lb 8 oz (170.8 kg)  03/07/18 (!) 380 lb (172.4 kg)  03/01/18 (!) 380 lb (172.4 kg)   Lab Results  Component Value Date   CREATININE 0.92 03/09/2018   CREATININE 0.78 03/07/2018   CREATININE 0.58 (L) 03/01/2018    Objective:   Physical Exam  Constitutional: He is oriented to person, place, and time. He appears well-developed and well-nourished.  HENT:  Head: Normocephalic and atraumatic.  Neck: Normal range of motion. Neck supple. No JVD present.  Cardiovascular: Normal rate and regular rhythm.  Pulmonary/Chest: Effort normal. He has no wheezes. He has no rales.  Abdominal: Soft. He exhibits no distension. There is no tenderness.  Musculoskeletal: He exhibits no edema or tenderness.  Neurological: He is alert and oriented to person, place, and time.  Skin: Skin is warm and dry.  Psychiatric: He has a normal mood and affect. His behavior is normal. Thought content normal.  Nursing note and vitals reviewed.     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  - not adding salt to his food at home but does cook with it at work and, again, he's sampling the food every time he is at work cooking. Importance of closely following a 2000mg  sodium diet was  reviewed with him.  - tolerating lisinopril 5mg  daily; fatigue worsened when entresto tried - does not meet ReDS vest criteria due to BMI  2: HTN- - BP elevated but he woke up late so took his medications late and rushed to his appointment  - BMP from 03/09/18 reviewed and showed sodium 139, potassium 4.2, creatinine 0.92 and GFR >60   3: Obstructive sleep apnea- - wearing CPAP nightly and feels like he's sleeping well  4: Diabetes- - glucose a couple of days ago was 196 - A1c on 06/25/16 was 7.3%  Medication bottles were reviewed.  Return in 6 months or sooner for any questions/ problems before then.

## 2018-04-19 NOTE — Patient Instructions (Signed)
Continue weighing daily and call for an overnight weight gain of > 2 pounds or a weekly weight gain of >5 pounds. 

## 2018-10-15 ENCOUNTER — Telehealth: Payer: Self-pay

## 2018-10-15 NOTE — Telephone Encounter (Signed)
   TELEPHONE CALL NOTE  This patient has been deemed a candidate for follow-up tele-health visit to limit community exposure during the Covid-19 pandemic. I spoke with the patient via phone to discuss instructions. The patient was advised to review the section on consent for treatment as well. The patient will receive a phone call 2-3 days prior to their E-Visit at which time consent will be verbally confirmed. A Virtual Office Visit appointment type has been scheduled for 10/18/2018 with Clarisa Kindred FNP.  [name], CMA 10/15/2018 2:41 PM

## 2018-10-15 NOTE — Telephone Encounter (Signed)
TELEPHONE CALL NOTE  DESTINED Marc Wright has been deemed a candidate for a follow-up tele-health visit to limit community exposure during the Covid-19 pandemic. I spoke with the patient via phone to ensure availability of phone/video source, confirm preferred email & phone number, discuss instructions and expectations, and review consent.   I reminded Marc Wright to be prepared with any vital sign and/or heart rhythm information that could potentially be obtained via home monitoring, at the time of his visit.  Finally, I reminded Marc Wright to expect an e-mail containing a link for their video-based visit approximately 15 minutes before his visit, or alternatively, a phone call at the time of his visit if his visit is planned to be a phone encounter.  Did the patient verbally consent to treatment as below? YES  Marc Wright, CMA 10/15/2018 2:42 PM  CONSENT FOR TELE-HEALTH VISIT - PLEASE REVIEW  I hereby voluntarily request, consent and authorize [facility] and its employed or contracted physicians, physician assistants, nurse practitioners or other licensed health care professionals (the Practitioner), to provide me with telemedicine health care services (the "Services") as deemed necessary by the treating Practitioner. I acknowledge and consent to receive the Services by the Practitioner via telemedicine. I understand that the telemedicine visit will involve communicating with the Practitioner through telephonic communication technology and the disclosure of certain medical information by electronic transmission. I acknowledge that I have been given the opportunity to request an in-person assessment or other available alternative prior to the telemedicine visit and am voluntarily participating in the telemedicine visit.  I understand that I have the right to withhold or withdraw my consent to the use of telemedicine in the course of my care at any time, without affecting my right  to future care or treatment, and that the Practitioner or I may terminate the telemedicine visit at any time. I understand that I have the right to inspect all information obtained and/or recorded in the course of the telemedicine visit and may receive copies of available information for a reasonable fee.  I understand that some of the potential risks of receiving the Services via telemedicine include:  Marc Wright Delay or interruption in medical evaluation due to technological equipment failure or disruption; . Information transmitted may not be sufficient (e.g. poor resolution of images) to allow for appropriate medical decision making by the Practitioner; and/or  . In rare instances, security protocols could fail, causing a breach of personal health information.  Furthermore, I acknowledge that it is my responsibility to provide information about my medical history, conditions and care that is complete and accurate to the best of my ability. I acknowledge that Practitioner's advice, recommendations, and/or decision may be based on factors not within their control, such as incomplete or inaccurate data provided by me or lack of visual representation. I understand that the practice of medicine is not an exact science and that Practitioner makes no warranties or guarantees regarding treatment outcomes. I acknowledge that I will receive a copy of this consent concurrently upon execution via email to the email address I last provided but may also request a printed copy by calling the office of [facility].    I understand that my insurance may be billed for this visit.   I have read or had this consent read to me. . I understand the contents of this consent, which adequately explains the benefits and risks of the Services being provided via telemedicine.  Marc Wright  I have been provided ample opportunity to ask questions regarding this consent and the Services and have had my questions answered to my  satisfaction. . I give my informed consent for the services to be provided through the use of telemedicine in my medical care  By participating in this telemedicine visit I agree to the above.

## 2018-10-18 ENCOUNTER — Encounter: Payer: Self-pay | Admitting: Family

## 2018-10-18 ENCOUNTER — Other Ambulatory Visit: Payer: Self-pay

## 2018-10-18 ENCOUNTER — Ambulatory Visit: Payer: Self-pay | Attending: Family | Admitting: Family

## 2018-10-18 DIAGNOSIS — I5022 Chronic systolic (congestive) heart failure: Secondary | ICD-10-CM

## 2018-10-18 DIAGNOSIS — I10 Essential (primary) hypertension: Secondary | ICD-10-CM

## 2018-10-18 DIAGNOSIS — G4733 Obstructive sleep apnea (adult) (pediatric): Secondary | ICD-10-CM

## 2018-10-18 NOTE — Progress Notes (Signed)
Virtual Visit via Telephone Note    Evaluation Performed:  Follow-up visit  This visit type was conducted due to national recommendations for restrictions regarding the COVID-19 Pandemic (e.g. social distancing).  This format is felt to be most appropriate for this patient at this time.  All issues noted in this document were discussed and addressed.  No physical exam was performed (except for noted visual exam findings with Video Visits).  Please refer to the patient's chart (MyChart message for video visits and phone note for telephone visits) for the patient's consent to telehealth for [facility]  Date:  10/18/2018   ID:  Marc Wright, DOB Feb 08, 1976, MRN [id number]  Patient Location:  [address] [place] [address]   Provider location:   [facility] [address] [place], [place] [address]  PCP:  Patient, No Pcp Per  Cardiologist:  No primary care provider on file.  Electrophysiologist:  None   Chief Complaint:  Minimal fatigue  History of Present Illness:    Marc Wright is a 43 y.o. male who presents via audio/video conferencing for a telehealth visit today.  Patient verified DOB and address.  The patient does not have symptoms concerning for COVID-19 infection (fever, chills, cough, or new SHORTNESS OF BREATH).   He has minimal fatigue upon moderate exertion. He describes this as chronic in nature having been present for several years. He has chronic edema in his legs and cough related to allergies associated along with this. He denies any difficulty sleeping, sore throat, shortness of breath, chest pain or dizziness. Currently is sleeping on the couch at family member's house and is wearing his CPAP nightly. Has been with his family since COVID-19 outbreak. Hasn't been weighing himself but does feel like he's picked up a couple of pounds because he's not as active since the gyms are closed. Has been walking the dog daily.  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   Prior CV studies:   The following studies were reviewed today:  Echo report from 06/26/16 reviewed and showed an EF of 25-30%  Past Medical History:  Diagnosis Date  . Asthma   . CHF (congestive heart failure) (HCC)   . Diabetes mellitus without complication (HCC)   . OSA on CPAP   . Pneumonia 06/2016   Past Surgical History:  Procedure Laterality Date  . I&D EXTREMITY    . SKIN GRAFT      Prior to Admission medications   Medication Sig Start Date End Date Taking? Authorizing Provider  B Complex-C (B-COMPLEX WITH VITAMIN C) tablet Take 1 tablet by mouth daily.   Yes [provider]  CALCIUM/MAGNESIUM/ZINC FORMULA PO Take 1 tablet by mouth daily as needed.   Yes [provider]  carvedilol (COREG) 12.5 MG tablet Take 1 tablet (12.5 mg total) by mouth 2 (two) times daily with a meal. 03/01/18  Yes Chrislyn Seedorf A, FNP  fexofenadine (ALLEGRA) 180 MG tablet Take 180 mg by mouth daily. Taking as needed   Yes [provider]  furosemide (LASIX) 80 MG tablet Take 1 tablet (80 mg total) by mouth 2 (two) times daily. 03/01/18  Yes Judge Duque, [name] A, FNP  lisinopril (PRINIVIL,ZESTRIL) 5 MG tablet Take 1 tablet (5 mg total) by mouth daily. 03/01/18  Yes Clarisa Kindred A, FNP  metFORMIN (GLUCOPHAGE) 500 MG tablet Take 1 tablet (500 mg total) by mouth 2 (two) times daily with a meal.  03/01/18  Yes Leonel Mccollum, [name] A, FNP  multivitamin (ONE-A-DAY MEN'S) TABS tablet Take 1 tablet by mouth daily.   Yes [provider]  naproxen sodium (ALEVE) 220 MG tablet Take 220 mg by mouth daily as needed.   Yes [provider]  niacin 250 MG tablet Take 250 mg by mouth at bedtime.   Yes [provider]      Allergies:   Patient has no known allergies.   Social History   Tobacco Use  . Smoking status: Current Every Day Smoker    Packs/day: 0.75    Types: Cigarettes    Start date: 11/23/2016  . Smokeless tobacco:  Never Used  Substance Use Topics  . Alcohol use: Yes    Comment: occasional   . Drug use: No     Family Hx: The patient's family history includes Congestive Heart Failure in his mother; Hypertension in his mother.  ROS:   Please see the history of present illness.     All other systems reviewed and are negative.   Labs/Other Tests and Data Reviewed:    Recent Labs: 03/09/2018: BUN 22; Creatinine, Ser 0.92; Hemoglobin 17.0; Magnesium 1.8; Platelets 233; Potassium 4.2; Sodium 139   Recent Lipid Panel Lab Results  Component Value Date/Time   CHOL 118 06/25/2016 06:03 PM   TRIG 112 06/25/2016 06:03 PM   HDL 28 (L) 06/25/2016 06:03 PM   CHOLHDL 4.2 06/25/2016 06:03 PM   LDLCALC 68 06/25/2016 06:03 PM    Wt Readings from Last 3 Encounters:  04/19/18 (!) 376 lb 8 oz (170.8 kg)  03/07/18 (!) 380 lb (172.4 kg)  03/01/18 (!) 380 lb (172.4 kg)     Exam:    Vital Signs:  There were no vitals taken for this visit.   Well nourished, well developed male in no  acute distress.   ASSESSMENT & PLAN:    1. 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  The importance of social distancing was discussed today.  Patient Risk:   After full review of this patients clinical status, I feel that they are at least moderate risk at this time.  Time:   Today, I have spent 10 minutes with the patient with telehealth technology discussing diet, medications and symptoms to report.     Medication Adjustments/Labs and Tests Ordered: Current medicines are reviewed at length with the patient today.  Concerns regarding medicines are outlined above.   Tests Ordered: No orders  of the defined types were placed in this encounter.  Medication Changes: No orders of the defined types were placed in this encounter.   Disposition: Follow-up in 4 months or sooner for any questions/problems before then.   Signed, Delma Freeze, FNP  10/18/2018 11:03 AM    [facility]

## 2018-10-18 NOTE — Patient Instructions (Signed)
Resume weighing daily and call for an overnight weight gain of > 2 pounds or a weekly weight gain of >5 pounds. 

## 2018-11-06 ENCOUNTER — Emergency Department (HOSPITAL_COMMUNITY): Payer: Self-pay

## 2018-11-06 ENCOUNTER — Encounter (HOSPITAL_COMMUNITY): Payer: Self-pay

## 2018-11-06 ENCOUNTER — Other Ambulatory Visit: Payer: Self-pay

## 2018-11-06 ENCOUNTER — Inpatient Hospital Stay (HOSPITAL_COMMUNITY)
Admission: EM | Admit: 2018-11-06 | Discharge: 2018-11-11 | DRG: 871 | Disposition: A | Discharge status: Home / Self Care | Payer: Self-pay | Attending: Internal Medicine | Admitting: Internal Medicine

## 2018-11-06 DIAGNOSIS — J45909 Unspecified asthma, uncomplicated: Secondary | ICD-10-CM | POA: Diagnosis present

## 2018-11-06 DIAGNOSIS — E860 Dehydration: Secondary | ICD-10-CM | POA: Diagnosis present

## 2018-11-06 DIAGNOSIS — R000 Tachycardia, unspecified: Secondary | ICD-10-CM | POA: Diagnosis present

## 2018-11-06 DIAGNOSIS — Z7984 Long term (current) use of oral hypoglycemic drugs: Secondary | ICD-10-CM

## 2018-11-06 DIAGNOSIS — L0390 Cellulitis, unspecified: Secondary | ICD-10-CM | POA: Diagnosis not present

## 2018-11-06 DIAGNOSIS — I110 Hypertensive heart disease with heart failure: Secondary | ICD-10-CM | POA: Diagnosis present

## 2018-11-06 DIAGNOSIS — F17210 Nicotine dependence, cigarettes, uncomplicated: Secondary | ICD-10-CM | POA: Diagnosis present

## 2018-11-06 DIAGNOSIS — R079 Chest pain, unspecified: Secondary | ICD-10-CM

## 2018-11-06 DIAGNOSIS — J189 Pneumonia, unspecified organism: Secondary | ICD-10-CM | POA: Diagnosis present

## 2018-11-06 DIAGNOSIS — Z8249 Family history of ischemic heart disease and other diseases of the circulatory system: Secondary | ICD-10-CM

## 2018-11-06 DIAGNOSIS — R591 Generalized enlarged lymph nodes: Secondary | ICD-10-CM | POA: Diagnosis present

## 2018-11-06 DIAGNOSIS — E1165 Type 2 diabetes mellitus with hyperglycemia: Secondary | ICD-10-CM

## 2018-11-06 DIAGNOSIS — Z20828 Contact with and (suspected) exposure to other viral communicable diseases: Secondary | ICD-10-CM | POA: Diagnosis present

## 2018-11-06 DIAGNOSIS — J159 Unspecified bacterial pneumonia: Secondary | ICD-10-CM | POA: Diagnosis present

## 2018-11-06 DIAGNOSIS — Z791 Long term (current) use of non-steroidal anti-inflammatories (NSAID): Secondary | ICD-10-CM

## 2018-11-06 DIAGNOSIS — Z8701 Personal history of pneumonia (recurrent): Secondary | ICD-10-CM

## 2018-11-06 DIAGNOSIS — A419 Sepsis, unspecified organism: Principal | ICD-10-CM | POA: Diagnosis present

## 2018-11-06 DIAGNOSIS — Z881 Allergy status to other antibiotic agents status: Secondary | ICD-10-CM

## 2018-11-06 DIAGNOSIS — E119 Type 2 diabetes mellitus without complications: Secondary | ICD-10-CM

## 2018-11-06 DIAGNOSIS — Z79899 Other long term (current) drug therapy: Secondary | ICD-10-CM

## 2018-11-06 DIAGNOSIS — R0902 Hypoxemia: Secondary | ICD-10-CM | POA: Diagnosis present

## 2018-11-06 DIAGNOSIS — I5022 Chronic systolic (congestive) heart failure: Secondary | ICD-10-CM | POA: Diagnosis present

## 2018-11-06 DIAGNOSIS — I10 Essential (primary) hypertension: Secondary | ICD-10-CM | POA: Diagnosis present

## 2018-11-06 DIAGNOSIS — Z9119 Patient's noncompliance with other medical treatment and regimen: Secondary | ICD-10-CM

## 2018-11-06 DIAGNOSIS — I890 Lymphedema, not elsewhere classified: Secondary | ICD-10-CM | POA: Diagnosis present

## 2018-11-06 DIAGNOSIS — J9601 Acute respiratory failure with hypoxia: Secondary | ICD-10-CM

## 2018-11-06 DIAGNOSIS — Z6841 Body Mass Index (BMI) 40.0 and over, adult: Secondary | ICD-10-CM

## 2018-11-06 DIAGNOSIS — R739 Hyperglycemia, unspecified: Secondary | ICD-10-CM | POA: Diagnosis present

## 2018-11-06 DIAGNOSIS — G4733 Obstructive sleep apnea (adult) (pediatric): Secondary | ICD-10-CM | POA: Diagnosis present

## 2018-11-06 DIAGNOSIS — J181 Lobar pneumonia, unspecified organism: Secondary | ICD-10-CM

## 2018-11-06 DIAGNOSIS — R6520 Severe sepsis without septic shock: Secondary | ICD-10-CM

## 2018-11-06 LAB — BRAIN NATRIURETIC PEPTIDE: B Natriuretic Peptide: 46.9 pg/mL (ref 0.0–100.0)

## 2018-11-06 LAB — BASIC METABOLIC PANEL
Anion gap: 10 (ref 5–15)
BUN: 12 mg/dL (ref 6–20)
CO2: 23 mmol/L (ref 22–32)
Calcium: 8.9 mg/dL (ref 8.9–10.3)
Chloride: 97 mmol/L — ABNORMAL LOW (ref 98–111)
Creatinine, Ser: 0.7 mg/dL (ref 0.61–1.24)
GFR calc Af Amer: >60 mL/min (ref >60)
GFR calc non Af Amer: >60 mL/min (ref >60)
Glucose, Bld: 396 mg/dL — ABNORMAL HIGH (ref 70–99)
Potassium: 4 mmol/L (ref 3.5–5.1)
Sodium: 130 mmol/L — ABNORMAL LOW (ref 135–145)

## 2018-11-06 LAB — URINALYSIS, ROUTINE W REFLEX MICROSCOPIC
Bacteria, UA: NONE SEEN
Bilirubin Urine: NEGATIVE
Glucose, UA: >=500 mg/dL — AB
Hgb urine dipstick: NEGATIVE
Ketones, ur: 5 mg/dL — AB
Leukocytes,Ua: NEGATIVE
Nitrite: NEGATIVE
Protein, ur: 30 mg/dL — AB
Specific Gravity, Urine: 1.026 (ref 1.005–1.030)
pH: 5 (ref 5.0–8.0)

## 2018-11-06 LAB — CBG MONITORING, ED
Glucose-Capillary: 376 mg/dL — ABNORMAL HIGH (ref 70–99)
Glucose-Capillary: 366 mg/dL — ABNORMAL HIGH (ref 70–99)

## 2018-11-06 LAB — CBC
HCT: 51.8 % (ref 39.0–52.0)
Hemoglobin: 17.6 g/dL — ABNORMAL HIGH (ref 13.0–17.0)
MCH: 29.1 pg (ref 26.0–34.0)
MCHC: 34 g/dL (ref 30.0–36.0)
MCV: 85.6 fL (ref 80.0–100.0)
Platelets: 176 10*3/uL (ref 150–400)
RBC: 6.05 MIL/uL — ABNORMAL HIGH (ref 4.22–5.81)
RDW: 13.9 % (ref 11.5–15.5)
WBC: 12.5 10*3/uL — ABNORMAL HIGH (ref 4.0–10.5)
nRBC: 0 % (ref 0.0–0.2)

## 2018-11-06 LAB — SARS CORONAVIRUS 2 BY RT PCR (HOSPITAL ORDER, PERFORMED IN ~~LOC~~ HOSPITAL LAB): SARS Coronavirus 2: NEGATIVE

## 2018-11-06 LAB — LIPASE, BLOOD: Lipase: 43 U/L (ref 11–51)

## 2018-11-06 LAB — LACTIC ACID, PLASMA
Lactic Acid, Venous: 2.4 mmol/L (ref 0.5–1.9)
Lactic Acid, Venous: 2.4 mmol/L (ref 0.5–1.9)

## 2018-11-06 LAB — PROTIME-INR
INR: 0.9 (ref 0.8–1.2)
Prothrombin Time: 12.2 seconds (ref 11.4–15.2)

## 2018-11-06 LAB — TROPONIN I: Troponin I: <0.03 ng/mL (ref <0.03)

## 2018-11-06 MED ORDER — SODIUM CHLORIDE 0.9 % IV BOLUS
1000.0000 mL | Freq: Once | INTRAVENOUS | Status: DC
  Administered 2018-11-06: 1000 mL via INTRAVENOUS

## 2018-11-06 MED ORDER — SODIUM CHLORIDE 0.9% FLUSH
3.0000 mL | Freq: Once | INTRAVENOUS | Status: AC
  Administered 2018-11-06: 3 mL via INTRAVENOUS

## 2018-11-06 MED ORDER — IOHEXOL 350 MG/ML SOLN
100.0000 mL | Freq: Once | INTRAVENOUS | Status: AC | PRN
  Administered 2018-11-06: 100 mL via INTRAVENOUS

## 2018-11-06 MED ORDER — ASPIRIN 81 MG PO CHEW
324.0000 mg | CHEWABLE_TABLET | Freq: Once | ORAL | Status: AC
  Administered 2018-11-06: 324 mg via ORAL
  Filled 2018-11-06: qty 4

## 2018-11-06 MED ORDER — SODIUM CHLORIDE 0.9 % IV BOLUS (SEPSIS)
1000.0000 mL | Freq: Once | INTRAVENOUS | Status: AC
  Administered 2018-11-06: 1000 mL via INTRAVENOUS

## 2018-11-06 MED ORDER — VANCOMYCIN HCL 10 G IV SOLR
2500.0000 mg | Freq: Once | INTRAVENOUS | Status: AC
  Administered 2018-11-06: 2500 mg via INTRAVENOUS
  Filled 2018-11-06: qty 2000

## 2018-11-06 MED ORDER — VANCOMYCIN HCL IN DEXTROSE 1-5 GM/200ML-% IV SOLN: 1000.0000 mg | Freq: Once | INTRAVENOUS | Status: DC

## 2018-11-06 MED ORDER — SODIUM CHLORIDE 0.9 % IV SOLN
500.0000 mg | INTRAVENOUS | Status: DC
  Administered 2018-11-06: 500 mg via INTRAVENOUS
  Filled 2018-11-06 (×2): qty 500

## 2018-11-06 MED ORDER — INSULIN ASPART 100 UNIT/ML ~~LOC~~ SOLN
10.0000 [IU] | Freq: Once | SUBCUTANEOUS | Status: AC
  Administered 2018-11-06: 10 [IU] via SUBCUTANEOUS
  Filled 2018-11-06: qty 1

## 2018-11-06 MED ORDER — SODIUM CHLORIDE 0.9 % IV BOLUS (SEPSIS)
600.0000 mL | Freq: Once | INTRAVENOUS | Status: AC
  Administered 2018-11-06: 600 mL via INTRAVENOUS

## 2018-11-06 MED ORDER — SODIUM CHLORIDE (PF) 0.9 % IJ SOLN
INTRAMUSCULAR | Status: AC
  Administered 2018-11-06
  Filled 2018-11-06: qty 50

## 2018-11-06 MED ORDER — SODIUM CHLORIDE 0.9 % IV SOLN
2.0000 g | INTRAVENOUS | Status: DC
  Administered 2018-11-06: 2 g via INTRAVENOUS
  Filled 2018-11-06 (×2): qty 20

## 2018-11-06 MED ORDER — ACETAMINOPHEN 325 MG PO TABS
650.0000 mg | ORAL_TABLET | Freq: Once | ORAL | Status: AC
  Administered 2018-11-06: 650 mg via ORAL
  Filled 2018-11-06: qty 2

## 2018-11-06 NOTE — ED Triage Notes (Signed)
Pt with c/o CP since yesterday. Points to left chest, radiates to back, left arm and neck. Describes as dull pain.

## 2018-11-06 NOTE — ED Notes (Signed)
Date and time results received: 11/06/18 9:53 PM  Test: Lactic Acid Critical Value: 2.4  Name of Provider Notified: Jeraldine Loots

## 2018-11-06 NOTE — ED Provider Notes (Signed)
[facility]-EMERGENCY DEPT Provider Note   CSN: [id number] Arrival date & time: 11/06/18  1935    History   Chief Complaint Chief Complaint  Patient presents with  . Chest Pain    HPI Marc Wright is a 43 y.o. male with PMHx Asthma, Diabetes, OSA on CPAP, and CHF with EF 25-30% who presents to the ED complaining of sudden onset, intermittent, left sided chest pain radiating into left arm x 2 days. Pt also complains of shortness of breath, dry cough, and nausea. No hx of MI. Positive FHx of CAD with both mother and father having had MI < age 58. No known recent sick contacts. Last echo in 2018 with EF 25%.        Past Medical History:  Diagnosis Date  . Asthma   . CHF (congestive heart failure) (HCC)   . Diabetes mellitus without complication (HCC)   . OSA on CPAP   . Pneumonia 06/2016    Patient Active Problem List   Diagnosis Date Noted  . Chronic systolic heart failure (HCC) 07/23/2016  . HTN (hypertension) 07/23/2016  . Obstructive sleep apnea 07/23/2016  . Tobacco use 07/23/2016  . Diabetes (HCC) 07/23/2016    Past Surgical History:  Procedure Laterality Date  . I&D EXTREMITY    . SKIN GRAFT          Home Medications    Prior to Admission medications   Medication Sig Start Date End Date Taking? Authorizing Provider  B Complex-C (B-COMPLEX WITH VITAMIN C) tablet Take 1 tablet by mouth daily.    [provider]  CALCIUM/MAGNESIUM/ZINC FORMULA PO Take 1 tablet by mouth daily as needed.    [provider]  carvedilol (COREG) 12.5 MG tablet Take 1 tablet (12.5 mg total) by mouth 2 (two) times daily with a meal. 03/01/18   Hackney, Jarold Song, FNP  fexofenadine (ALLEGRA) 180 MG tablet Take 180 mg by mouth daily. Taking as needed    [provider]  furosemide (LASIX) 80 MG tablet Take 1 tablet (80 mg total) by mouth 2 (two) times daily. 03/01/18   Delma Freeze, FNP  lisinopril (PRINIVIL,ZESTRIL) 5 MG tablet Take 1  tablet (5 mg total) by mouth daily. 03/01/18   Delma Freeze, FNP  metFORMIN (GLUCOPHAGE) 500 MG tablet Take 1 tablet (500 mg total) by mouth 2 (two) times daily with a meal. 03/01/18   Delma Freeze, FNP  multivitamin (ONE-A-DAY MEN'S) TABS tablet Take 1 tablet by mouth daily.    [provider]  naproxen sodium (ALEVE) 220 MG tablet Take 220 mg by mouth daily as needed.    [provider]  niacin 250 MG tablet Take 250 mg by mouth at bedtime.    [provider]    Family History Family History  Problem Relation Age of Onset  . Congestive Heart Failure Mother   . Hypertension Mother     Social History Social History   Tobacco Use  . Smoking status: Current Every Day Smoker    Packs/day: 0.75    Types: Cigarettes    Start date: 11/23/2016  . Smokeless tobacco: Never Used  Substance Use Topics  . Alcohol use: Yes    Comment: occasional   . Drug use: No     Allergies   Patient has no known allergies.   Review of Systems Review of Systems  Constitutional: Positive for fever. Negative for chills.  HENT: Negative for congestion.   Eyes: Negative for  redness.  Respiratory: Positive for cough and shortness of breath.   Cardiovascular: Positive for chest pain.  Gastrointestinal: Positive for nausea. Negative for abdominal pain, constipation, diarrhea and vomiting.  Genitourinary: Negative for dysuria.  Musculoskeletal: Positive for back pain.  Skin: Negative for rash.  Neurological: Negative for headaches.     Physical Exam Updated Vital Signs BP 114/76 (BP Location: Right Arm)   Pulse (!) 133   Temp (!) 104.7 F (40.4 C) (Rectal)   Resp (!) 23   Ht 6\' 3"  (1.905 m)   Wt (!) 158.8 kg   SpO2 93%   BMI 43.75 kg/m   Physical Exam Vitals signs and nursing note reviewed.  Constitutional:      Appearance: He is obese. He is diaphoretic.     Comments: Very warm to the touch  HENT:     Head: Normocephalic and atraumatic.  Eyes:      Conjunctiva/sclera: Conjunctivae normal.     Pupils: Pupils are equal, round, and reactive to light.  Neck:     Musculoskeletal: Neck supple.  Cardiovascular:     Rate and Rhythm: Normal rate and regular rhythm.     Pulses:          Radial pulses are 2+ on the right side and 2+ on the left side.       Dorsalis pedis pulses are 2+ on the right side and 2+ on the left side.     Heart sounds: Normal heart sounds.  Pulmonary:     Effort: Pulmonary effort is normal.     Breath sounds: Normal breath sounds. No decreased breath sounds, wheezing, rhonchi or rales.  Chest:     Chest wall: No tenderness.  Abdominal:     General: Bowel sounds are normal.     Palpations: Abdomen is soft.     Tenderness: There is no abdominal tenderness.  Musculoskeletal:     Right lower leg: Edema present.     Left lower leg: Edema present.     Comments: Nonpitting edema to bilateral lower extremities  Skin:    General: Skin is warm.  Neurological:     Mental Status: He is alert.      ED Treatments / Results  Labs (all labs ordered are listed, but only abnormal results are displayed) Labs Reviewed  BASIC METABOLIC PANEL - Abnormal; Notable for the following components:      Result Value   Sodium 130 (*)    Chloride 97 (*)    Glucose, Bld 396 (*)    All other components within normal limits  CBC - Abnormal; Notable for the following components:   WBC 12.5 (*)    RBC 6.05 (*)    Hemoglobin 17.6 (*)    All other components within normal limits  LACTIC ACID, PLASMA - Abnormal; Notable for the following components:   Lactic Acid, Venous 2.4 (*)    All other components within normal limits  LACTIC ACID, PLASMA - Abnormal; Notable for the following components:   Lactic Acid, Venous 2.4 (*)    All other components within normal limits  URINALYSIS, ROUTINE W REFLEX MICROSCOPIC - Abnormal; Notable for the following components:   Glucose, UA >=500 (*)    Ketones, ur 5 (*)    Protein, ur 30 (*)    All  other components within normal limits  CBG MONITORING, ED - Abnormal; Notable for the following components:   Glucose-Capillary 376 (*)    All other components within normal limits  SARS CORONAVIRUS 2 (HOSPITAL ORDER, PERFORMED IN [facility])  CULTURE, BLOOD (ROUTINE X 2)  CULTURE, BLOOD (ROUTINE X 2)  RESPIRATORY PANEL BY PCR  TROPONIN I  PROTIME-INR  BRAIN NATRIURETIC PEPTIDE  LIPASE, BLOOD  HEPATIC FUNCTION PANEL  BETA-HYDROXYBUTYRIC ACID  CK  MAGNESIUM  PHOSPHORUS  CBC WITH DIFFERENTIAL/PLATELET  LACTATE DEHYDROGENASE    EKG None  Radiology Dg Chest Port 1 View  Result Date: 11/06/2018 CLINICAL DATA:  Acute onset of chest pain that began yesterday. EXAM: PORTABLE CHEST 1 VIEW COMPARISON:  06/26/2016 and earlier, including CTA chest 06/25/2016. FINDINGS: Suboptimal inspiration accounts for crowded bronchovascular markings, especially in the bases, and accentuates the cardiac silhouette. Taking this into account, cardiac silhouette moderately enlarged, unchanged. Patchy airspace opacities involving the MEDIAL RIGHT lung base. Lungs otherwise clear. Pulmonary vascularity normal without evidence of pulmonary edema. No visible pleural effusions. IMPRESSION: 1. Acute pneumonia involving the MEDIAL RIGHT lung base. 2. Stable cardiomegaly without pulmonary edema. Electronically Signed   By: Hulan Saas M.D.   On: 11/06/2018 20:11    Procedures Procedures (including critical care time)  Medications Ordered in ED Medications  cefTRIAXone (ROCEPHIN) 2 g in sodium chloride 0.9 % 100 mL IVPB (0 g Intravenous Stopped 11/06/18 2142)  azithromycin (ZITHROMAX) 500 mg in sodium chloride 0.9 % 250 mL IVPB (500 mg Intravenous New Bag/Given 11/06/18 2150)  insulin aspart (novoLOG) injection 10 Units (has no administration in time range)  vancomycin (VANCOCIN) 2,500 mg in sodium chloride 0.9 % 500 mL IVPB (has no administration in time range)  sodium chloride flush (NS) 0.9 %  injection 3 mL (3 mLs Intravenous Given 11/06/18 1946)  sodium chloride flush (NS) 0.9 % injection 3 mL (3 mLs Intravenous Given 11/06/18 2120)  aspirin chewable tablet 324 mg (324 mg Oral Given 11/06/18 1956)  acetaminophen (TYLENOL) tablet 650 mg (650 mg Oral Given 11/06/18 1956)  sodium chloride 0.9 % bolus 1,000 mL (0 mLs Intravenous Stopped 11/06/18 2227)    And  sodium chloride 0.9 % bolus 1,000 mL (1,000 mLs Intravenous New Bag/Given 11/06/18 2147)    And  sodium chloride 0.9 % bolus 600 mL (600 mLs Intravenous New Bag/Given 11/06/18 2227)     Initial Impression / Assessment and Plan / ED Course  I have reviewed the triage vital signs and the nursing notes.  Pertinent labs & imaging results that were available during my care of the patient were reviewed by me and considered in my medical decision making (see chart for details).    Pt is a 43 year old male who presents with left sided chest pain, shortness of breath, nausea, and vomiting x 2 days. Pt found to be febrile in the ED initially at 102.6 then at 104.7 rectally. Given Tylenol to start. ACS workup initiated given pt's complaint and positive fhx of CAD < age 49. Pt also meeting SIRS criteria; unknown source of infection but will get CXR prior to calling code sepsis. Sepsis labs also ordered at this time and 1 L NS bolus given.   CXR with pneumonia in right middle lobe; sepsis protocol initiated; started on Azithromycin and Rocephin. Maintenance fluids at 30 cc/kg started as well. Awaiting Covid testing prior to admission as patient also having a dry cough.   Covid negative. Repeat lactic acid stable at 2.4. Will call hospitalist for admission.   Dr. Jeraldine Loots received call from hospitalist for patient; she is recommending CTA chest to get a better picture of patient's pneumonia. Orders are put  in and patient admitted for CAP.  Marc Wright was evaluated in Emergency Department on 11/06/2018 for the symptoms described in the history of  present illness. He was evaluated in the context of the global COVID-19 pandemic, which necessitated consideration that the patient might be at risk for infection with the SARS-CoV-2 virus that causes COVID-19. Institutional protocols and algorithms that pertain to the evaluation of patients at risk for COVID-19 are in a state of rapid change based on information released by regulatory bodies including the CDC and federal and state organizations. These policies and algorithms were followed during the patient's care in the ED.        Final Clinical Impressions(s) / ED Diagnoses   Final diagnoses:  Sepsis without acute organ dysfunction, due to unspecified organism [facility])  Community acquired pneumonia of right lower lobe of lung [facility])    ED Discharge Orders    None       Tanda Rockers, PA-C 11/06/18 2249    Gerhard Munch, MD 11/11/18 [phone]

## 2018-11-06 NOTE — Progress Notes (Addendum)
Pharmacy Antibiotic Note  Marc Wright is a 43 y.o. male admitted on 11/06/2018 with sepsis.  Pharmacy has been consulted for vancomycin dosing.  Plan: Vancomycin 2500 mg x1 then 2 Gm IV q12h for est AUC = 419 Goal AUC = 400-550 F/u scr/cultures/levels Adjusted Lovenox to 80 mg daily (~0.5 mg/kg) in pt with BMI =43  Height: 6\' 3"  (190.5 cm) Weight: (!) 350 lb (158.8 kg) IBW/kg (Calculated) : 84.5  Temp (24hrs), Avg:103.7 F (39.8 C), Min:102.6 F (39.2 C), Max:104.7 F (40.4 C)  Recent Labs  Lab 11/06/18 1947 11/06/18 1948 11/06/18 2010  WBC  --  12.5*  --   CREATININE  --  0.70  --   LATICACIDVEN 2.4*  --  2.4*    Estimated Creatinine Clearance: 188.4 mL/min (by C-G formula based on SCr of 0.7 mg/dL).    No Known Allergies  Antimicrobials this admission: 5/16 rocephin >>  5/16 zmax >>  5/16 vancomycin >>  Dose adjustments this admission:   Microbiology results:  BCx:   UCx:    Sputum:    MRSA PCR:   Thank you for allowing pharmacy to be a part of this patient's care.  Lorenza Evangelist 11/06/2018 10:48 PM

## 2018-11-06 NOTE — H&P (Addendum)
DA AUTHEMENT UUV:[id number] DOB: [birth year] DOA: 11/06/2018     PCP: none Outpatient Specialists:   CARDS:  Clarisa Kindred    Patient arrived to ER on 11/06/18 at 1935  Patient coming from: home Lives  With family    Chief Complaint:  Chief Complaint  Patient presents with  . Chest Pain    HPI: Marc Wright is a 43 y.o. male with medical history significant of OSA on CPAP, asthma, CHF EF 25-30%, DM 2     Presented with chest pain since yesterday radiated to his back left arm and neck feels dull, is been going on for past 2 days associated shortness of breath and dry cough and some nausea no prior history of MI s. No N/V/D Has been isolating at home Has been going out to get groceries Occasional tobacco 1/2 pack a day trying to quit.  2 years ago he was admitted for PNA feels similar to now No sick contacts   Infectious risk factors:  Reports fever, shortness of breath, dry cough, chest pain,  URI symptoms, anosmia/change in taste,  abdominal pain,  Body aches, severe fatigue   In RAPID COVID TEST NEGATIVE     Regarding pertinent Chronic problems:    HTN on Coreg, lsinopril   CHF  Systolic  - last echo 06/26/2016 LV EF: 25% -   30%  On lasix 80 mg BID and lsinopril    DM 2 -  Lab Results  Component Value Date   HGBA1C 7.3 (H) 06/25/2016    PO meds only metformin has not been checking his BG for a while     Morbid obesity-   BMI Readings from Last 1 Encounters:  11/06/18 43.75 kg/m       Asthma -well  controlled on home inhalers/ since 43 yo                            no prior admission  For Asthma                       No history of intubation      OSA -on  CPAP but have not been copliant   While in ER: Found to have elevated blood sugar up to 396 and elevated white cell count of 12.5 Lactic acid up to 2.4 Initially febrile up to 102.6 then 104.7 rectally given Tylenol Chest x-ray worrisome for right middle lobe pneumonia  Started on Rocephin  and azithromycin The following Work up has been ordered so far:  Orders Placed This Encounter  Procedures  . Culture, blood (Routine x 2)  . SARS Coronavirus 2 (CEPHEID- Performed in [facility] hospital lab), [facility]  . Respiratory Panel by PCR  . DG Chest Port 1 View  . CT Angio Chest PE W and/or Wo Contrast  . Basic metabolic panel  . CBC  . Troponin I - ONCE - STAT  . Lactic acid, plasma  . Protime-INR  . Urinalysis, Routine w reflex microscopic  . Brain natriuretic peptide (order if patient c/o SOB ONLY)  . Lipase, blood  . Hepatic function panel  . Beta-hydroxybutyric acid  . CK  . Magnesium  . Phosphorus  . CBC with Differential/Platelet  . Lactate dehydrogenase  . Blood gas, venous  . C-reactive protein  . D-dimer, quantitative (not at [facility])  . Ferritin  . Fibrinogen  . Interleukin-6, Plasma  . Procalcitonin  .  Sedimentation rate  . Troponin I - Now Then Q6H  . Diet NPO time specified  . Saline Lock IV, Maintain IV access  . Notify Physician if pt is possible Sepsis patient  . Document Actual / Estimated Weight  . Insert / maintain saline lock  . Cardiac monitoring  . Initiate Carrier Fluid Protocol  . Cardiac monitoring  . Refer to Sidebar Report for: Sepsis Bundle ED/IP  . Document vital signs within 1-hour of fluid bolus completion and notify provider of bolus completion  . Document Actual / Estimated Weight  . Insert peripheral IV x 2  . Initiate Carrier Fluid Protocol  . Cardiac monitoring  . Novel Coronavirus PPE supplies (droplet and contact precautions) yellow stethoscopes, surgical mask, gowns, surgical caps, face shield, goggles, CAPR - on the floor/unit, cleaning Sani-Cloth (orange and purple top)  . Place COVID-19 isolation sign and PPE checklist outside the DOOR  . Do not give nonsteroidal anti-inflammatory drugs (NSAIDs)  . Patient to wear surgical mask during transportation  . Place working phone next to the patient  . Initiate Oral Care  Protocol  . Initiate Carrier Fluid Protocol  . Call Code Sepsis (Carelink [phone]) Reason for Consult? tracking  . pharmacy consult  . Consult to hospitalist  . vancomycin per pharmacy consult  . Consult to diabetes coordinator  . Droplet and Contact precautions  . Pulse oximetry, continuous  . Pulse oximetry, continuous  . Pulse oximetry, continuous  . CBG monitoring, ED  . CBG monitoring, ED  . EKG 12-Lead  . ED EKG  . Insert peripheral IV  . Saline lock IV  . Insert saline lock  . Admit to Inpatient (patient's expected length of stay will be greater than 2 midnights or inpatient only procedure)    Following Medications were ordered in ER: Medications  cefTRIAXone (ROCEPHIN) 2 g in sodium chloride 0.9 % 100 mL IVPB (0 g Intravenous Stopped 11/06/18 2142)  azithromycin (ZITHROMAX) 500 mg in sodium chloride 0.9 % 250 mL IVPB (0 mg Intravenous Stopped 11/06/18 2256)  vancomycin (VANCOCIN) 2,500 mg in sodium chloride 0.9 % 500 mL IVPB (2,500 mg Intravenous New Bag/Given 11/06/18 2340)  sodium chloride flush (NS) 0.9 % injection 3 mL (3 mLs Intravenous Given 11/06/18 1946)  sodium chloride flush (NS) 0.9 % injection 3 mL (3 mLs Intravenous Given 11/06/18 2120)  aspirin chewable tablet 324 mg (324 mg Oral Given 11/06/18 1956)  acetaminophen (TYLENOL) tablet 650 mg (650 mg Oral Given 11/06/18 1956)  sodium chloride 0.9 % bolus 1,000 mL (0 mLs Intravenous Stopped 11/06/18 2227)    And  sodium chloride 0.9 % bolus 1,000 mL (0 mLs Intravenous Stopped 11/06/18 2255)    And  sodium chloride 0.9 % bolus 600 mL (0 mLs Intravenous Stopped 11/06/18 2329)  insulin aspart (novoLOG) injection 10 Units (10 Units Subcutaneous Given 11/06/18 2257)  iohexol (OMNIPAQUE) 350 MG/ML injection 100 mL (100 mLs Intravenous Contrast Given 11/06/18 2316)  sodium chloride (PF) 0.9 % injection (  Given 11/06/18 2344)        Consult Orders  (From admission, onward)         Start     Ordered   11/06/18 2217   Consult to hospitalist  Once    Provider:  Therisa Doyne, MD  Question Answer Comment  Place call to: [facility]   Reason for Consult Admit      11/06/18 2216          Significant initial  Findings: Abnormal Labs  Reviewed  BASIC METABOLIC PANEL - Abnormal; Notable for the following components:      Result Value   Sodium 130 (*)    Chloride 97 (*)    Glucose, Bld 396 (*)    All other components within normal limits  CBC - Abnormal; Notable for the following components:   WBC 12.5 (*)    RBC 6.05 (*)    Hemoglobin 17.6 (*)    All other components within normal limits  LACTIC ACID, PLASMA - Abnormal; Notable for the following components:   Lactic Acid, Venous 2.4 (*)    All other components within normal limits  LACTIC ACID, PLASMA - Abnormal; Notable for the following components:   Lactic Acid, Venous 2.4 (*)    All other components within normal limits  URINALYSIS, ROUTINE W REFLEX MICROSCOPIC - Abnormal; Notable for the following components:   Glucose, UA >=500 (*)    Ketones, ur 5 (*)    Protein, ur 30 (*)    All other components within normal limits  CBG MONITORING, ED - Abnormal; Notable for the following components:   Glucose-Capillary 376 (*)    All other components within normal limits  CBG MONITORING, ED - Abnormal; Notable for the following components:   Glucose-Capillary 366 (*)    All other components within normal limits    Otherwise labs showing:    Recent Labs  Lab 11/06/18 1948  NA 130*  K 4.0  CO2 23  GLUCOSE 396*  BUN 12  CREATININE 0.70  CALCIUM 8.9    Cr    stable,   Lab Results  Component Value Date   CREATININE 0.70 11/06/2018   CREATININE 0.92 03/09/2018   CREATININE 0.78 03/07/2018    No results for input(s): AST, ALT, ALKPHOS, BILITOT, PROT, ALBUMIN in the last 168 hours. Lab Results  Component Value Date   CALCIUM 8.9 11/06/2018      WBC      Component Value Date/Time   WBC 12.5 (H) 11/06/2018 1948   ANC     Component Value Date/Time   NEUTROABS 5.2 03/09/2018 1430   ALC No results found for: LYMPHOABS    Plt: Lab Results  Component Value Date   PLT 176 11/06/2018     Lactic Acid, Venous    Component Value Date/Time   LATICACIDVEN 2.4 (HH) 11/06/2018 2010     HG/HCT  Up from baseline see below    Component Value Date/Time   HGB 17.6 (H) 11/06/2018 1948   HCT 51.8 11/06/2018 1948    Recent Labs  Lab 11/06/18 2008  LIPASE 43   No results for input(s): AMMONIA in the last 168 hours.  No components found for: LABALBU   Troponin   Cardiac Panel (last 3 results) Recent Labs    11/06/18 1948  TROPONINI <0.03    BNP (last 3 results) Recent Labs    11/06/18 1948  BNP 46.9     DM  labs:  HbA1C: No results for input(s): HGBA1C in the last 8760 hours.     CBG (last 3)  Recent Labs    11/06/18 2206 11/06/18 2253  GLUCAP 376* 366*     UA   no evidence of UTI    Urine analysis:    Component Value Date/Time   COLORURINE YELLOW 11/06/2018 1947   APPEARANCEUR CLEAR 11/06/2018 1947   LABSPEC 1.026 11/06/2018 1947   PHURINE 5.0 11/06/2018 1947   GLUCOSEU >=500 (A) 11/06/2018 1947   HGBUR NEGATIVE 11/06/2018 1947  BILIRUBINUR NEGATIVE 11/06/2018 1947   KETONESUR 5 (A) 11/06/2018 1947   PROTEINUR 30 (A) 11/06/2018 1947   NITRITE NEGATIVE 11/06/2018 1947   LEUKOCYTESUR NEGATIVE 11/06/2018 1947       CXR - Right middle lobe PNA   CTA chest  no PE, bilateral multifocal infiltrates and significant lymphadenopathy cannot rule out malignancy   ECG:  Personally reviewed by me showing: HR : 133 Rhythm:  Sinus tachycardia nonspecific changes,   QTC 581      ED Triage Vitals  Enc Vitals Group     BP 11/06/18 1944 114/76     Pulse Rate 11/06/18 1944 (!) 133     Resp 11/06/18 1944 (!) 23     Temp 11/06/18 1944 (!) 102.6 F (39.2 C)     Temp Source 11/06/18 1944 Oral     SpO2 11/06/18 1944 93 %     Weight 11/06/18 1947 (!) 350 lb (158.8 kg)      Height 11/06/18 1947 6\' 3"  (1.905 m)     Head Circumference --      Peak Flow --      Pain Score 11/06/18 1938 6     Pain Loc --      Pain Edu? --      Excl. in GC? --   TMAX(24)@       Latest  Blood pressure 114/76, pulse (!) 133, temperature (!) 104.7 F (40.4 C), temperature source Rectal, resp. rate (!) 23, height 6\' 3"  (1.905 m), weight (!) 158.8 kg, SpO2 93 %.    Hospitalist was called for admission for CAP withy acute hypoxia   Review of Systems:    Pertinent positives include: Fevers, chills, fatigue,  abdominal pain, Constitutional:  No weight loss, night sweats, weight loss  HEENT:  No headaches, Difficulty swallowing,Tooth/dental problems,Sore throat,  No sneezing, itching, ear ache, nasal congestion, post nasal drip,  Cardio-vascular:  No chest pain, Orthopnea, PND, anasarca, dizziness, palpitations.no Bilateral lower extremity swelling  GI:  No heartburn, indigestion,  nausea, vomiting, diarrhea, change in bowel habits, loss of appetite, melena, blood in stool, hematemesis Resp:  no shortness of breath at rest. No dyspnea on exertion, No excess mucus, no productive cough, No non-productive cough, No coughing up of blood.No change in color of mucus.No wheezing. Skin:  no rash or lesions. No jaundice GU:  no dysuria, change in color of urine, no urgency or frequency. No straining to urinate.  No flank pain.  Musculoskeletal:  No joint pain or no joint swelling. No decreased range of motion. No back pain.  Psych:  No change in mood or affect. No depression or anxiety. No memory loss.  Neuro: no localizing neurological complaints, no tingling, no weakness, no double vision, no gait abnormality, no slurred speech, no confusion  All systems reviewed and apart from HOPI all are negative  Past Medical History:   Past Medical History:  Diagnosis Date  . Asthma   . CHF (congestive heart failure) (HCC)   . Diabetes mellitus without complication (HCC)   . OSA on  CPAP   . Pneumonia 06/2016      Past Surgical History:  Procedure Laterality Date  . I&D EXTREMITY    . SKIN GRAFT      Social History:  Ambulatory   independently      reports that he has been smoking cigarettes. He started smoking about 1 years ago. He has been smoking about 0.75 packs per day. He has never used smokeless tobacco. He  reports current alcohol use. He reports that he does not use drugs.   Family History:   Family History  Problem Relation Age of Onset  . Congestive Heart Failure Mother   . Hypertension Mother     Allergies: No Known Allergies   Prior to Admission medications   Medication Sig Start Date End Date Taking? Authorizing Provider  B Complex-C (B-COMPLEX WITH VITAMIN C) tablet Take 1 tablet by mouth daily.    [provider]  CALCIUM/MAGNESIUM/ZINC FORMULA PO Take 1 tablet by mouth daily as needed.    [provider]  carvedilol (COREG) 12.5 MG tablet Take 1 tablet (12.5 mg total) by mouth 2 (two) times daily with a meal. 03/01/18   Hackney, Jarold Songina A, FNP  fexofenadine (ALLEGRA) 180 MG tablet Take 180 mg by mouth daily. Taking as needed    [provider]  furosemide (LASIX) 80 MG tablet Take 1 tablet (80 mg total) by mouth 2 (two) times daily. 03/01/18   Delma FreezeHackney, Tina A, FNP  lisinopril (PRINIVIL,ZESTRIL) 5 MG tablet Take 1 tablet (5 mg total) by mouth daily. 03/01/18   Delma FreezeHackney, Tina A, FNP  metFORMIN (GLUCOPHAGE) 500 MG tablet Take 1 tablet (500 mg total) by mouth 2 (two) times daily with a meal. 03/01/18   Delma FreezeHackney, Tina A, FNP  multivitamin (ONE-A-DAY MEN'S) TABS tablet Take 1 tablet by mouth daily.    [provider]  naproxen sodium (ALEVE) 220 MG tablet Take 220 mg by mouth daily as needed.    [provider]  niacin 250 MG tablet Take 250 mg by mouth at bedtime.    [provider]   Physical Exam: Blood pressure 114/76, pulse (!) 133, temperature (!) 104.7 F (40.4 C), temperature source Rectal,  resp. rate (!) 23, height 6\' 3"  (1.905 m), weight (!) 158.8 kg, SpO2 93 %. 1. General:  in No Acute distress   Chronically ill  -appearing 2. Psychological: Alert and Oriented 3. Head/ENT: Dry Mucous Membranes                          Head Non traumatic, neck supple                            Poor Dentition 4. SKIN:   decreased Skin turgor,  Skin clean Dry and intact no rash 5. Heart: Regular rate and rhythm no Murmur, no Rub or gallop 6. Lungs:   no wheezes or crackles   7. Abdomen: Soft,  non-tender, Non distended  Obese owel sounds present 8. Lower extremities: no clubbing, cyanosis, no  edema 9. Neurologically Grossly intact, moving all 4 extremities equally  10. MSK: Normal range of motion   All other LABS:     Recent Labs  Lab 11/06/18 1948  WBC 12.5*  HGB 17.6*  HCT 51.8  MCV 85.6  PLT 176     Recent Labs  Lab 11/06/18 1948  NA 130*  K 4.0  CL 97*  CO2 23  GLUCOSE 396*  BUN 12  CREATININE 0.70  CALCIUM 8.9     No results for input(s): AST, ALT, ALKPHOS, BILITOT, PROT, ALBUMIN in the last 168 hours.     Cultures:    Component Value Date/Time   SDES SPUTUM 06/26/2016 1230   SDES SPUTUM 06/26/2016 1230   SPECREQUEST Normal 06/26/2016 1230   SPECREQUEST Normal Reflexed from [id number] 06/26/2016 1230   CULT  06/26/2016 1230  Consistent with normal respiratory flora. Performed at [facility]    REPTSTATUS 06/26/2016 FINAL 06/26/2016 1230   REPTSTATUS 06/29/2016 FINAL 06/26/2016 1230     Radiological Exams on Admission: Ct Angio Chest Pe W And/or Wo Contrast  Result Date: 11/06/2018 CLINICAL DATA:  Chest pain, dyspnea, fever EXAM: CT ANGIOGRAPHY CHEST WITH CONTRAST TECHNIQUE: Multidetector CT imaging of the chest was performed using the standard protocol during bolus administration of intravenous contrast. Multiplanar CT image reconstructions and MIPs were obtained to evaluate the vascular anatomy. CONTRAST:  OMNIPAQUE IOHEXOL 350 MG/ML SOLN  COMPARISON:  Same day chest radiograph, CT chest, 03/06/2016 FINDINGS: Cardiovascular: Satisfactory opacification of the pulmonary arteries to the segmental level. No evidence of pulmonary embolism. Cardiomegaly and coronary artery calcifications. No pericardial effusion. Mediastinum/Nodes: Numerous bulky mediastinal and bilateral hilar lymph nodes, largest right paratracheal nodes measuring at least 2.6 cm in short axis. Thyroid gland, trachea, and esophagus demonstrate no significant findings. Lungs/Pleura: There are dense consolidative opacities of the superior segments of the lower lobes bilaterally (series 10, image 69). Additional scattered opacities bilaterally. No pleural effusion or pneumothorax. Upper Abdomen: No acute abnormality. Musculoskeletal: No chest wall abnormality. No acute or significant osseous findings. Review of the MIP images confirms the above findings. IMPRESSION: 1.  Negative examination for pulmonary embolism. 2. There are dense consolidative opacities of the superior segments of the lower lobes bilaterally (series 10, image 69). Additional scattered opacities bilaterally. Findings are consistent with multifocal infection. 3. Numerous bulky mediastinal and bilateral hilar lymph nodes, largest right paratracheal nodes measuring at least 2.6 cm in short axis. Although possibly reactive, given size these are concerning for malignancy such as lymphoma. 4.  Coronary artery disease. Electronically Signed   By: Lauralyn Primes M.D.   On: 11/06/2018 23:52   Dg Chest Port 1 View  Result Date: 11/06/2018 CLINICAL DATA:  Acute onset of chest pain that began yesterday. EXAM: PORTABLE CHEST 1 VIEW COMPARISON:  06/26/2016 and earlier, including CTA chest 06/25/2016. FINDINGS: Suboptimal inspiration accounts for crowded bronchovascular markings, especially in the bases, and accentuates the cardiac silhouette. Taking this into account, cardiac silhouette moderately enlarged, unchanged. Patchy airspace  opacities involving the MEDIAL RIGHT lung base. Lungs otherwise clear. Pulmonary vascularity normal without evidence of pulmonary edema. No visible pleural effusions. IMPRESSION: 1. Acute pneumonia involving the MEDIAL RIGHT lung base. 2. Stable cardiomegaly without pulmonary edema. Electronically Signed   By: Hulan Saas M.D.   On: 11/06/2018 20:11    Chart has been reviewed    Assessment/Plan   43 y.o. male with medical history significant of OSA on CPAP, asthma, CHF EF 25-30%, DM 2  Admitted for sepsis due to CAP and severe sepsis, chest CT showing multilobar PNA and lymphadenopathy could be lymphoma, will need repeat covid 19 testing tomorrow  Present on Admission: . CAP (community acquired pneumonia) -  - will admit for treatment of CAP will start on appropriate antibiotic coverage. broaden to add vanc   Obtain:  sputum cultures,                  Obtain respiratory panel serologies                  blood cultures                   strep pneumo UA antigen,                 Provide oxygen as needed.  initial COVID 19  testing is negative but given bilateral multilobar infiltrate and fever > 104 will repeat and send out For now continue precautions . Sepsis (HCC) -   -Patient meets sepsis criteria with   fever   leukocytosis   Tachycardia   Initial lactic acid Lactic Acid, Venous    Component Value Date/Time   LATICACIDVEN 2.4 (HH) 11/06/2018 2010   Source most likely:  pneumonia,    -We will rehydrate, treat with IV antibiotics, follow lactic acid - Await results of blood and urine culture and adjust antibiotics as needed - Obtain MRSA serologies  - Obtain respiratory panel   - repeat ABG to monitor pH and pco2  . Dehydration - will rehydrate being mindful of hx of heart failure Given soft BP will monitor in stepdown  . Chronic systolic heart failure (HCC) - - currently appears to be slightly on the dry side, hold home diuretics for tonight and restart when appears  euvolemic, carefuly follow fluid status and Cr, may need repeat echo when stable  . HTN (hypertension) - given soft bp will hold home meds for tonight, allow permissive hypertension  . Obstructive sleep apnea - hold of on CPAP, oxygen at night as needed . Hyperglycemia - add on Lantus, patient have not been compliant with monitoring bp   DM2 -  - Order Sensitive SSI   -Significantly worsening hyperglycemia will add Lantus 10 units and monitor probably would benefit from diabetes coordinator consult  -  check TSH and HgA1C  - Hold by mouth medications   Lymphadenopathy - will need repeat imaging and further evaluation once stable to evaluate for possible lymphoma, may need biopsy at a later time  Other plan as per orders.  DVT prophylaxis:    Lovenox     Code Status:  FULL CODE as per patient   I had personally discussed CODE STATUS with patient    Family Communication:   Family not at  Bedside  fe,    Disposition Plan:      To home once workup is complete and patient is stable  Diabetes coordinator consulted                    Consults called: notified e-link for e-link monitoring overnight  Admission status:  ED Disposition    ED Disposition Condition Comment   Admit  Hospital Area: [facility] [[id number]]  Level of Care: Stepdown [14]  Admit to SDU based on following criteria: Respiratory Distress:  Frequent assessment and/or intervention to maintain adequate ventilation/respiration, pulmonary toilet, and respiratory treatment.  Covid Evaluation: Person Under Investigation (PUI)  Isolation Risk Level: Low Risk/Droplet (Less than 4L [place] supplementation)  Diagnosis: CAP (community acquired pneumonia) [161096]  Admitting Physician: Therisa Doyne [[id number]]  Attending Physician: Therisa Doyne [[id number]]  Estimated length of stay: 3 - 4 days  Certification:: I certify this patient will need inpatient services for at least 2 midnights  PT Class (Do Not  Modify): Inpatient [101]  PT Acc Code (Do Not Modify): Private [1]         inpatient     Expect 2 midnight stay secondary to severity of patient's current illness including   hemodynamic instability despite optimal treatment (tachycardia  tachypnea  Hypoxia  )   Severe lab/radiological/exam abnormalities including:  PNA   and extensive comorbidities including: DM2   CHF  That are currently affecting medical management.   I expect  patient to be hospitalized for 2 midnights requiring inpatient medical care.  Patient is at high risk for adverse outcome (such as loss of life or disability) if not treated.  Indication for inpatient stay as follows:    Hemodynamic instability despite maximal medical therapy,    New or worsening hypoxia  Need for IV antibiotics, IV fluids     Level of care     SDU tele indefinitely please discontinue once patient no longer qualifies  Precautions:    Droplet and Contact precautions  PPE: Used by the provider:   P100  eye Goggles,  Gloves       Shataria Crist 11/07/2018, 12:13 AM    [facility]     after 2 AM please page floor coverage PA If 7AM-7PM, please contact the day team taking care of the patient using [email or web address]

## 2018-11-07 DIAGNOSIS — R591 Generalized enlarged lymph nodes: Secondary | ICD-10-CM | POA: Diagnosis present

## 2018-11-07 DIAGNOSIS — J189 Pneumonia, unspecified organism: Secondary | ICD-10-CM

## 2018-11-07 LAB — BLOOD GAS, ARTERIAL
Acid-base deficit: 0.3 mmol/L (ref 0.0–2.0)
Bicarbonate: 23.6 mmol/L (ref 20.0–28.0)
Drawn by: 11249
O2 Content: 2 L/min
O2 Saturation: 95.5 %
Patient temperature: 100.7
pCO2 arterial: 40.5 mmHg (ref 32.0–48.0)
pH, Arterial: 7.39 (ref 7.350–7.450)
pO2, Arterial: 80.1 mmHg — ABNORMAL LOW (ref 83.0–108.0)

## 2018-11-07 LAB — HEPATIC FUNCTION PANEL
ALT: 15 U/L (ref 0–44)
AST: 19 U/L (ref 15–41)
Albumin: 3 g/dL — ABNORMAL LOW (ref 3.5–5.0)
Alkaline Phosphatase: 81 U/L (ref 38–126)
Bilirubin, Direct: 0.2 mg/dL (ref 0.0–0.2)
Indirect Bilirubin: 0.6 mg/dL (ref 0.3–0.9)
Total Bilirubin: 0.8 mg/dL (ref 0.3–1.2)
Total Protein: 5.7 g/dL — ABNORMAL LOW (ref 6.5–8.1)

## 2018-11-07 LAB — CK: Total CK: 32 U/L — ABNORMAL LOW (ref 49–397)

## 2018-11-07 LAB — LACTIC ACID, PLASMA
Lactic Acid, Venous: 2.9 mmol/L (ref 0.5–1.9)
Lactic Acid, Venous: 2.2 mmol/L (ref 0.5–1.9)

## 2018-11-07 LAB — RAPID URINE DRUG SCREEN, HOSP PERFORMED
Amphetamines: NOT DETECTED
Barbiturates: NOT DETECTED
Benzodiazepines: NOT DETECTED
Cocaine: NOT DETECTED
Opiates: POSITIVE — AB
Tetrahydrocannabinol: POSITIVE — AB

## 2018-11-07 LAB — CBC WITH DIFFERENTIAL/PLATELET
Abs Immature Granulocytes: 0.1 10*3/uL — ABNORMAL HIGH (ref 0.00–0.07)
Basophils Absolute: 0.1 10*3/uL (ref 0.0–0.1)
Basophils Relative: 0 %
Eosinophils Absolute: 0 10*3/uL (ref 0.0–0.5)
Eosinophils Relative: 0 %
HCT: 44.4 % (ref 39.0–52.0)
Hemoglobin: 14.8 g/dL (ref 13.0–17.0)
Immature Granulocytes: 1 %
Lymphocytes Relative: 7 %
Lymphs Abs: 1 10*3/uL (ref 0.7–4.0)
MCH: 28.9 pg (ref 26.0–34.0)
MCHC: 33.3 g/dL (ref 30.0–36.0)
MCV: 86.7 fL (ref 80.0–100.0)
Monocytes Absolute: 0.8 10*3/uL (ref 0.1–1.0)
Monocytes Relative: 5 %
Neutro Abs: 13.3 10*3/uL — ABNORMAL HIGH (ref 1.7–7.7)
Neutrophils Relative %: 87 %
Platelets: 141 10*3/uL — ABNORMAL LOW (ref 150–400)
RBC: 5.12 MIL/uL (ref 4.22–5.81)
RDW: 13.9 % (ref 11.5–15.5)
WBC: 15.2 10*3/uL — ABNORMAL HIGH (ref 4.0–10.5)
nRBC: 0 % (ref 0.0–0.2)

## 2018-11-07 LAB — RESPIRATORY PANEL BY PCR

## 2018-11-07 LAB — COMPREHENSIVE METABOLIC PANEL
ALT: 18 U/L (ref 0–44)
AST: 15 U/L (ref 15–41)
Albumin: 3.5 g/dL (ref 3.5–5.0)
Alkaline Phosphatase: 89 U/L (ref 38–126)
Anion gap: 8 (ref 5–15)
BUN: 12 mg/dL (ref 6–20)
CO2: 27 mmol/L (ref 22–32)
Calcium: 8.3 mg/dL — ABNORMAL LOW (ref 8.9–10.3)
Chloride: 98 mmol/L (ref 98–111)
Creatinine, Ser: 0.86 mg/dL (ref 0.61–1.24)
GFR calc Af Amer: >60 mL/min (ref >60)
GFR calc non Af Amer: >60 mL/min (ref >60)
Glucose, Bld: 341 mg/dL — ABNORMAL HIGH (ref 70–99)
Potassium: 4.1 mmol/L (ref 3.5–5.1)
Sodium: 133 mmol/L — ABNORMAL LOW (ref 135–145)
Total Bilirubin: 0.8 mg/dL (ref 0.3–1.2)
Total Protein: 6.3 g/dL — ABNORMAL LOW (ref 6.5–8.1)

## 2018-11-07 LAB — CBC
HCT: 46.9 % (ref 39.0–52.0)
Hemoglobin: 15.1 g/dL (ref 13.0–17.0)
MCH: 28.2 pg (ref 26.0–34.0)
MCHC: 32.2 g/dL (ref 30.0–36.0)
MCV: 87.5 fL (ref 80.0–100.0)
Platelets: 173 10*3/uL (ref 150–400)
RBC: 5.36 MIL/uL (ref 4.22–5.81)
RDW: 13.9 % (ref 11.5–15.5)
WBC: 16.5 10*3/uL — ABNORMAL HIGH (ref 4.0–10.5)
nRBC: 0 % (ref 0.0–0.2)

## 2018-11-07 LAB — HEMOGLOBIN A1C
Hgb A1c MFr Bld: 13.3 % — ABNORMAL HIGH (ref 4.8–5.6)
Mean Plasma Glucose: 335.01 mg/dL

## 2018-11-07 LAB — GLUCOSE, CAPILLARY
Glucose-Capillary: 245 mg/dL — ABNORMAL HIGH (ref 70–99)
Glucose-Capillary: 255 mg/dL — ABNORMAL HIGH (ref 70–99)
Glucose-Capillary: 275 mg/dL — ABNORMAL HIGH (ref 70–99)
Glucose-Capillary: 283 mg/dL — ABNORMAL HIGH (ref 70–99)
Glucose-Capillary: 304 mg/dL — ABNORMAL HIGH (ref 70–99)
Glucose-Capillary: 357 mg/dL — ABNORMAL HIGH (ref 70–99)

## 2018-11-07 LAB — MAGNESIUM
Magnesium: 1.4 mg/dL — ABNORMAL LOW (ref 1.7–2.4)
Magnesium: 1.5 mg/dL — ABNORMAL LOW (ref 1.7–2.4)

## 2018-11-07 LAB — BLOOD GAS, VENOUS
Acid-Base Excess: 0.5 mmol/L (ref 0.0–2.0)
Bicarbonate: 27 mmol/L (ref 20.0–28.0)
FIO2: 21
O2 Saturation: 49.8 %
Patient temperature: 104
pCO2, Ven: 60.6 mmHg — ABNORMAL HIGH (ref 44.0–60.0)
pH, Ven: 7.29 (ref 7.250–7.430)

## 2018-11-07 LAB — LACTATE DEHYDROGENASE: LDH: 217 U/L — ABNORMAL HIGH (ref 98–192)

## 2018-11-07 LAB — BETA-HYDROXYBUTYRIC ACID: Beta-Hydroxybutyric Acid: 0.38 mmol/L — ABNORMAL HIGH (ref 0.05–0.27)

## 2018-11-07 LAB — TROPONIN I
Troponin I: <0.03 ng/mL (ref <0.03)
Troponin I: <0.03 ng/mL (ref <0.03)
Troponin I: <0.03 ng/mL (ref <0.03)

## 2018-11-07 LAB — EXPECTORATED SPUTUM ASSESSMENT W GRAM STAIN, RFLX TO RESP C: Special Requests: NORMAL

## 2018-11-07 LAB — MRSA PCR SCREENING: MRSA by PCR: NEGATIVE

## 2018-11-07 LAB — PROCALCITONIN
Procalcitonin: 2.98 ng/mL
Procalcitonin: 3.99 ng/mL

## 2018-11-07 LAB — SEDIMENTATION RATE: Sed Rate: 2 mm/hr (ref 0–16)

## 2018-11-07 LAB — FIBRINOGEN
Fibrinogen: 470 mg/dL (ref 210–475)
Fibrinogen: 503 mg/dL — ABNORMAL HIGH (ref 210–475)

## 2018-11-07 LAB — TSH: TSH: 0.598 u[IU]/mL (ref 0.350–4.500)

## 2018-11-07 LAB — SARS CORONAVIRUS 2 BY RT PCR (HOSPITAL ORDER, PERFORMED IN ~~LOC~~ HOSPITAL LAB): SARS Coronavirus 2: NEGATIVE

## 2018-11-07 LAB — PHOSPHORUS
Phosphorus: 1.9 mg/dL — ABNORMAL LOW (ref 2.5–4.6)
Phosphorus: 3.1 mg/dL (ref 2.5–4.6)

## 2018-11-07 LAB — URIC ACID: Uric Acid, Serum: 5.3 mg/dL (ref 3.7–8.6)

## 2018-11-07 LAB — FERRITIN: Ferritin: 94 ng/mL (ref 24–336)

## 2018-11-07 LAB — D-DIMER, QUANTITATIVE: D-Dimer, Quant: 0.53 ug/mL-FEU — ABNORMAL HIGH (ref 0.00–0.50)

## 2018-11-07 LAB — STREP PNEUMONIAE URINARY ANTIGEN: Strep Pneumo Urinary Antigen: NEGATIVE

## 2018-11-07 LAB — C-REACTIVE PROTEIN: CRP: 8.2 mg/dL — ABNORMAL HIGH (ref <1.0)

## 2018-11-07 MED ORDER — SODIUM CHLORIDE 0.9 % IV SOLN
INTRAVENOUS | Status: AC
  Administered 2018-11-07 (×2): via INTRAVENOUS

## 2018-11-07 MED ORDER — SODIUM CHLORIDE 0.9 % IV SOLN
1.0000 g | INTRAVENOUS | Status: DC
  Administered 2018-11-07 – 2018-11-08 (×2): 1 g via INTRAVENOUS
  Filled 2018-11-07 (×2): qty 1

## 2018-11-07 MED ORDER — SODIUM CHLORIDE 0.9 % IV SOLN
500.0000 mg | INTRAVENOUS | Status: DC
  Administered 2018-11-07 – 2018-11-08 (×2): 500 mg via INTRAVENOUS
  Filled 2018-11-07 (×2): qty 500

## 2018-11-07 MED ORDER — ACETAMINOPHEN 325 MG PO TABS
650.0000 mg | ORAL_TABLET | Freq: Four times a day (QID) | ORAL | Status: DC | PRN
  Administered 2018-11-07 – 2018-11-10 (×4): 650 mg via ORAL
  Filled 2018-11-07 (×5): qty 2

## 2018-11-07 MED ORDER — INSULIN STARTER KIT- SYRINGES (ENGLISH)
1.0000 | Freq: Once | Status: AC
  Administered 2018-11-07: 1
  Filled 2018-11-07: qty 1

## 2018-11-07 MED ORDER — VANCOMYCIN HCL 10 G IV SOLR
2000.0000 mg | Freq: Two times a day (BID) | INTRAVENOUS | Status: DC
  Administered 2018-11-07 – 2018-11-09 (×4): 2000 mg via INTRAVENOUS
  Filled 2018-11-07 (×5): qty 2000

## 2018-11-07 MED ORDER — INSULIN GLARGINE 100 UNIT/ML ~~LOC~~ SOLN: 10.0000 [IU] | Freq: Every day | SUBCUTANEOUS | Status: DC

## 2018-11-07 MED ORDER — ENOXAPARIN SODIUM 80 MG/0.8ML ~~LOC~~ SOLN
80.0000 mg | Freq: Every day | SUBCUTANEOUS | Status: DC
  Administered 2018-11-07 – 2018-11-11 (×5): 80 mg via SUBCUTANEOUS
  Filled 2018-11-07 (×5): qty 0.8

## 2018-11-07 MED ORDER — ACETAMINOPHEN 650 MG RE SUPP: 650.0000 mg | Freq: Four times a day (QID) | RECTAL | Status: DC | PRN

## 2018-11-07 MED ORDER — ORAL CARE MOUTH RINSE
15.0000 mL | Freq: Two times a day (BID) | OROMUCOSAL | Status: DC
  Administered 2018-11-08 – 2018-11-11 (×5): 15 mL via OROMUCOSAL

## 2018-11-07 MED ORDER — MAGNESIUM OXIDE 400 (241.3 MG) MG PO TABS
800.0000 mg | ORAL_TABLET | Freq: Two times a day (BID) | ORAL | Status: DC
  Administered 2018-11-07 – 2018-11-11 (×9): 800 mg via ORAL
  Filled 2018-11-07 (×9): qty 2

## 2018-11-07 MED ORDER — INSULIN ASPART 100 UNIT/ML ~~LOC~~ SOLN
0.0000 [IU] | SUBCUTANEOUS | Status: DC
  Administered 2018-11-07: 09:00:00 5 [IU] via SUBCUTANEOUS
  Administered 2018-11-07: 3 [IU] via SUBCUTANEOUS
  Administered 2018-11-07: 5 [IU] via SUBCUTANEOUS
  Administered 2018-11-07: 9 [IU] via SUBCUTANEOUS
  Administered 2018-11-07: 5 [IU] via SUBCUTANEOUS
  Administered 2018-11-07: 7 [IU] via SUBCUTANEOUS
  Administered 2018-11-08: 3 [IU] via SUBCUTANEOUS
  Administered 2018-11-08 (×2): 5 [IU] via SUBCUTANEOUS
  Administered 2018-11-08: 3 [IU] via SUBCUTANEOUS
  Administered 2018-11-08: 7 [IU] via SUBCUTANEOUS
  Administered 2018-11-09 (×3): 3 [IU] via SUBCUTANEOUS

## 2018-11-07 MED ORDER — INSULIN ASPART 100 UNIT/ML ~~LOC~~ SOLN
5.0000 [IU] | Freq: Three times a day (TID) | SUBCUTANEOUS | Status: DC
  Administered 2018-11-07 (×2): 5 [IU] via SUBCUTANEOUS

## 2018-11-07 MED ORDER — CHLORHEXIDINE GLUCONATE CLOTH 2 % EX PADS
6.0000 | MEDICATED_PAD | Freq: Every day | CUTANEOUS | Status: DC
  Administered 2018-11-07 – 2018-11-11 (×4): 6 via TOPICAL

## 2018-11-07 MED ORDER — ONDANSETRON HCL 4 MG PO TABS: 4.0000 mg | ORAL_TABLET | Freq: Four times a day (QID) | ORAL | Status: DC | PRN

## 2018-11-07 MED ORDER — ALBUTEROL SULFATE HFA 108 (90 BASE) MCG/ACT IN AERS: 2.0000 | INHALATION_SPRAY | RESPIRATORY_TRACT | Status: DC | PRN

## 2018-11-07 MED ORDER — INSULIN GLARGINE 100 UNIT/ML ~~LOC~~ SOLN
10.0000 [IU] | Freq: Two times a day (BID) | SUBCUTANEOUS | Status: DC
  Administered 2018-11-07 (×2): 10 [IU] via SUBCUTANEOUS
  Filled 2018-11-07 (×3): qty 0.1

## 2018-11-07 MED ORDER — ONDANSETRON HCL 4 MG/2ML IJ SOLN
4.0000 mg | Freq: Four times a day (QID) | INTRAMUSCULAR | Status: DC | PRN
  Filled 2018-11-07: qty 2

## 2018-11-07 MED ORDER — NIACIN 250 MG PO TABS
250.0000 mg | ORAL_TABLET | Freq: Every day | ORAL | Status: DC
  Administered 2018-11-07 – 2018-11-10 (×4): 250 mg via ORAL
  Filled 2018-11-07 (×4): qty 1

## 2018-11-07 MED ORDER — HYDROCODONE-ACETAMINOPHEN 5-325 MG PO TABS
1.0000 | ORAL_TABLET | ORAL | Status: DC | PRN
  Administered 2018-11-07: 2 via ORAL
  Administered 2018-11-07 – 2018-11-08 (×2): 1 via ORAL
  Administered 2018-11-08 (×2): 2 via ORAL
  Administered 2018-11-09 (×3): 1 via ORAL
  Administered 2018-11-10: 2 via ORAL
  Administered 2018-11-10: 1 via ORAL
  Administered 2018-11-11: 2 via ORAL
  Filled 2018-11-07: qty 2
  Filled 2018-11-07 (×4): qty 1
  Filled 2018-11-07 (×4): qty 2
  Filled 2018-11-07 (×2): qty 1
  Filled 2018-11-07: qty 2

## 2018-11-07 MED ORDER — LORATADINE 10 MG PO TABS
10.0000 mg | ORAL_TABLET | Freq: Every day | ORAL | Status: DC
  Administered 2018-11-07 – 2018-11-11 (×5): 10 mg via ORAL
  Filled 2018-11-07 (×5): qty 1

## 2018-11-07 MED ORDER — LIVING WELL WITH DIABETES BOOK
Freq: Once | Status: AC
  Administered 2018-11-07: 16:00:00
  Filled 2018-11-07: qty 1

## 2018-11-07 NOTE — Plan of Care (Signed)
  Problem: Education: Goal: Knowledge of General Education information will improve Description Including pain rating scale, medication(s)/side effects and non-pharmacologic comfort measures Outcome: Progressing   

## 2018-11-07 NOTE — Progress Notes (Signed)
Inpatient Diabetes Program Recommendations  AACE/ADA: New Consensus Statement on Inpatient Glycemic Control (2015)  Target Ranges:  Prepandial:   less than 140 mg/dL      Peak postprandial:   less than 180 mg/dL (1-2 hours)      Critically ill patients:  140 - 180 mg/dL   Lab Results  Component Value Date   GLUCAP 275 (H) 11/07/2018   HGBA1C 13.3 (H) 11/07/2018    Review of Glycemic Control  Diabetes history: DM2 Outpatient Diabetes medications: Metformin 500 mg bid Current orders for Inpatient glycemic control: Lantus 10 units + Novolog sensitive q 4 hrs.  Inpatient Diabetes Program Recommendations:   -Increase Lantus to 30 units and start this am -Novolog 4 units tid meal coverage tid if eats 50% -Change diet to carb modified  Noted patient does not have insurance listed, so if discharged home on insulin, may want to do Novolin 70/30 insulin mix to be more affordable from [place]. Patient could get insulin in a vial for approx. $25 per vial or $40 for pens.   Spoke with patient by phone (DM coordinator working remotely). Spoke with pt about A1C results with them and explained what an A1C is, basic pathophysiology of DM Type 2, basic home care, basic diabetes diet nutrition principles, importance of checking CBGs and maintaining good CBG control to prevent long-term and short-term complications. Reviewed signs and symptoms of hyperglycemia and hypoglycemia and how to treat hypoglycemia at home. Also reviewed blood sugar goals at home.  RNs to provide ongoing basic DM education at bedside with this patient. Have ordered educational booklet, insulin starter kit, and DM videos.  Patient has a glucose meter but is in [place] where he normally lives. He has been staying with his brother in [place] during quarantine. Will follow patient during hospitalization.  Thank you, Nani Gasser. Schyler Butikofer, RN, MSN, CDE  Diabetes Coordinator Inpatient Glycemic Control Team Team Pager [phone]  (8am-5pm) 11/07/2018 10:39 AM

## 2018-11-07 NOTE — Progress Notes (Signed)
CRITICAL VALUE ALERT  Critical Value:  LA 2.9  Date & Time Notied:  11/07/2018 0128  Provider Notified: Clance Boll  Orders Received/Actions taken: No new orders

## 2018-11-07 NOTE — Progress Notes (Signed)
PROGRESS NOTE    Marc Wright  DDU:[id number] DOB: 1975-08-29 DOA: 11/06/2018 PCP: Patient, No Pcp Per    Brief Narrative:  43 year old gentleman with history of obstructive sleep apnea not using CPAP, history of asthma, chronic congestive heart failure with known ejection fraction of 25 to 30%, type 2 diabetes on metformin.  Poorly controlled chronic medical problems.  Presented to the emergency room with chest pain, left arm pain shortness of breath and dry cough with nausea.  Occasional smoker.  History of pneumonia.  In the emergency room temperature as high as 104.7.  Lactic acid 2.4.  Blood sugars 396. Rapid COVID-19 test negative in the ER. CT chest showed bilateral multifocal pneumonia, negative for PE.   Assessment & Plan:   Principal Problem:   CAP (community acquired pneumonia) Active Problems:   Chronic systolic heart failure (HCC)   HTN (hypertension)   Obstructive sleep apnea   Diabetes ([place])   Dehydration   Hyperglycemia   Sepsis ([place])   Lymphadenopathy  Multifocal bacterial pneumonia with sepsis: Most likely community-acquired pneumonia.  Remains on Rocephin and azithromycin and vancomycin. Blood cultures pending.  Streptococcal and Legionella antigen pending.  Sputum cultures pending. Initial COVID-19 negative, given high suspicion a repeat send out test is done. Continue appropriate precautions for COVID-19 PUI. Keep on oxygen to keep saturations more than 90%. Incentive spirometry, albuterol as inhaler. Blood pressures are currently adequate.  Adequate peripheral perfusion.  Hold off on further IV fluids with history of congestive heart failure.  He has received adequate resuscitation with IV fluids.  Obstructive sleep apnea: Untreated.  Fairly stable now.  Holding off on using CPAP with suspected COVID.  Uncontrolled type 2 diabetes: Undertreated with metformin.  A1c is 13. Will start on long-acting insulin and prandial insulin.  Will need simplified  regimen on discharge.  Titrate as per blood sugars in the hospital.  Hypertension: Blood pressures are currently soft.  Holding all antihypertensive medications including diuretics.  Chronic systolic congestive heart failure: Currently euvolemic.  Continue to hold Lasix.  Lymphadenopathy: Suspected from acute infection.  Patient will need surveillance imaging after discharge.  DVT prophylaxis: Lovenox Code Status: Full code Family Communication: None.  Patient will talk with family. Disposition Plan: Inpatient stepdown unit.   Consultants:   None.  Procedures:   None.  Antimicrobials:   Rocephin, azithromycin, vancomycin 11/06/2018   Subjective: Patient was seen and examined.  Overnight events noted.  He has some dry cough but denies any sputum production.  Blood sugars remain very high. Patient had a temperature of 103-104.  He says he is sick only for 2 days.  Mostly staying home other than going for groceries.   Objective: Vitals:   11/07/18 0400 11/07/18 0430 11/07/18 0800 11/07/18 1000  BP:  (!) 113/54 (!) 113/37 (!) 106/47  Pulse: (!) 113 (!) 108 (!) 113 (!) 111  Resp: (!) 25 (!) 26 (!) 21 (!) 27  Temp: 98.1 F (36.7 C)     TempSrc: Oral     SpO2: 96% 95% 95% 96%  Weight:      Height:        Intake/Output Summary (Last 24 hours) at 11/07/2018 1051 Last data filed at 11/07/2018 0400 Gross per 24 hour  Intake 3693.11 ml  Output 300 ml  Net 3393.11 ml   Filed Weights   11/06/18 1947  Weight: (!) 158.8 kg    Examination:  General exam: Appears calm and comfortable, was on 3 to 4 L of oxygen  however cannula was on the cheek.  Morbidly obese gentleman. Respiratory system: Clear to auscultation. Respiratory effort normal.  No added sounds. Cardiovascular system: S1 & S2 heard, RRR. No JVD, murmurs, rubs, gallops or clicks. No pedal edema. Gastrointestinal system: Abdomen is nondistended, soft and nontender. No organomegaly or masses felt. Normal bowel  sounds heard.  Obese and pendulous. Central nervous system: Alert and oriented. No focal neurological deficits. Extremities: Symmetric 5 x 5 power. Skin: No rashes, lesions or ulcers Psychiatry: Judgement and insight appear normal. Mood & affect appropriate.     Data Reviewed: I have personally reviewed following labs and imaging studies  CBC: Recent Labs  Lab 11/06/18 1948 11/07/18 0003 11/07/18 0239  WBC 12.5* 15.2* 16.5*  NEUTROABS  --  13.3*  --   HGB 17.6* 14.8 15.1  HCT 51.8 44.4 46.9  MCV 85.6 86.7 87.5  PLT 176 141* 431   Basic Metabolic Panel: Recent Labs  Lab 11/06/18 1948 11/07/18 0003 11/07/18 0239  NA 130*  --  133*  K 4.0  --  4.1  CL 97*  --  98  CO2 23  --  27  GLUCOSE 396*  --  341*  BUN 12  --  12  CREATININE 0.70  --  0.86  CALCIUM 8.9  --  8.3*  MG  --  1.4* 1.5*  PHOS  --  1.9* 3.1   GFR: Estimated Creatinine Clearance: 175.2 mL/min (by C-G formula based on SCr of 0.86 mg/dL). Liver Function Tests: Recent Labs  Lab 11/07/18 0003 11/07/18 0239  AST 19 15  ALT 15 18  ALKPHOS 81 89  BILITOT 0.8 0.8  PROT 5.7* 6.3*  ALBUMIN 3.0* 3.5   Recent Labs  Lab 11/06/18 2008  LIPASE 43   No results for input(s): AMMONIA in the last 168 hours. Coagulation Profile: Recent Labs  Lab 11/06/18 1948  INR 0.9   Cardiac Enzymes: Recent Labs  Lab 11/06/18 1948 11/07/18 0003 11/07/18 0008 11/07/18 0534  CKTOTAL  --  32*  --   --   TROPONINI <0.03  --  <0.03 <0.03   BNP (last 3 results) No results for input(s): PROBNP in the last 8760 hours. HbA1C: Recent Labs    11/07/18 0239  HGBA1C 13.3*   CBG: Recent Labs  Lab 11/06/18 2206 11/06/18 2253 11/07/18 0405 11/07/18 0905  GLUCAP 376* 366* 357* 275*   Lipid Profile: No results for input(s): CHOL, HDL, LDLCALC, TRIG, CHOLHDL, LDLDIRECT in the last 72 hours. Thyroid Function Tests: Recent Labs    11/07/18 0239  TSH 0.598   Anemia Panel: Recent Labs    11/07/18 0006   FERRITIN 94   Sepsis Labs: Recent Labs  Lab 11/06/18 1947 11/06/18 2010 11/07/18 0008 11/07/18 0239 11/07/18 0534  PROCALCITON  --   --  2.98 3.99  --   LATICACIDVEN 2.4* 2.4*  --  2.9* 2.2*    Recent Results (from the past 240 hour(s))  SARS Coronavirus 2 (CEPHEID- Performed in [facility] hospital lab), Hosp Order     Status: None   Collection Time: 11/06/18  7:56 PM  Result Value Ref Range Status   SARS Coronavirus 2 NEGATIVE NEGATIVE Final    Comment: (NOTE) If result is NEGATIVE SARS-CoV-2 target nucleic acids are NOT DETECTED. The SARS-CoV-2 RNA is generally detectable in upper and lower  respiratory specimens during the acute phase of infection. The lowest  concentration of SARS-CoV-2 viral copies this assay can detect is 250  copies / mL.  A negative result does not preclude SARS-CoV-2 infection  and should not be used as the sole basis for treatment or other  patient management decisions.  A negative result may occur with  improper specimen collection / handling, submission of specimen other  than nasopharyngeal swab, presence of viral mutation(s) within the  areas targeted by this assay, and inadequate number of viral copies  (<250 copies / mL). A negative result must be combined with clinical  observations, patient history, and epidemiological information. If result is POSITIVE SARS-CoV-2 target nucleic acids are DETECTED. The SARS-CoV-2 RNA is generally detectable in upper and lower  respiratory specimens dur ing the acute phase of infection.  Positive  results are indicative of active infection with SARS-CoV-2.  Clinical  correlation with patient history and other diagnostic information is  necessary to determine patient infection status.  Positive results do  not rule out bacterial infection or co-infection with other viruses. If result is PRESUMPTIVE POSTIVE SARS-CoV-2 nucleic acids MAY BE PRESENT.   A presumptive positive result was obtained on the  submitted specimen  and confirmed on repeat testing.  While 2019 novel coronavirus  (SARS-CoV-2) nucleic acids may be present in the submitted sample  additional confirmatory testing may be necessary for epidemiological  and / or clinical management purposes  to differentiate between  SARS-CoV-2 and other Sarbecovirus currently known to infect humans.  If clinically indicated additional testing with an alternate test  methodology [phone]) is advised. The SARS-CoV-2 RNA is generally  detectable in upper and lower respiratory sp ecimens during the acute  phase of infection. The expected result is Negative. Fact Sheet for Patients:  StrictlyIdeas.no Fact Sheet for Healthcare Providers: BankingDealers.co.za This test is not yet approved or cleared by the [place] FDA and has been authorized for detection and/or diagnosis of SARS-CoV-2 by FDA under an Emergency Use Authorization (EUA).  This EUA will remain in effect (meaning this test can be used) for the duration of the COVID-19 declaration under Section 564(b)(1) of the Act, 21 U.S.C. section 360bbb-3(b)(1), unless the authorization is terminated or revoked sooner. Performed at [facility], [place] [address] [place], [place] [address]   Respiratory Panel by PCR     Status: None   Collection Time: 11/06/18  7:56 PM  Result Value Ref Range Status   Adenovirus NOT DETECTED NOT DETECTED Final   Coronavirus 229E NOT DETECTED NOT DETECTED Final    Comment: (NOTE) The Coronavirus on the Respiratory Panel, DOES NOT test for the novel  Coronavirus (2019 nCoV)    Coronavirus HKU1 NOT DETECTED NOT DETECTED Final   Coronavirus NL63 NOT DETECTED NOT DETECTED Final   Coronavirus OC43 NOT DETECTED NOT DETECTED Final   Metapneumovirus NOT DETECTED NOT DETECTED Final   Rhinovirus / Enterovirus NOT DETECTED NOT DETECTED Final   Influenza A NOT DETECTED NOT DETECTED Final    Influenza B NOT DETECTED NOT DETECTED Final   Parainfluenza Virus 1 NOT DETECTED NOT DETECTED Final   Parainfluenza Virus 2 NOT DETECTED NOT DETECTED Final   Parainfluenza Virus 3 NOT DETECTED NOT DETECTED Final   Parainfluenza Virus 4 NOT DETECTED NOT DETECTED Final   Respiratory Syncytial Virus NOT DETECTED NOT DETECTED Final   Bordetella pertussis NOT DETECTED NOT DETECTED Final   Chlamydophila pneumoniae NOT DETECTED NOT DETECTED Final   Mycoplasma pneumoniae NOT DETECTED NOT DETECTED Final    Comment: Performed at [facility], [place]. [address], [place],  [address]  MRSA PCR Screening  Status: None   Collection Time: 11/07/18 12:15 AM  Result Value Ref Range Status   MRSA by PCR NEGATIVE NEGATIVE Final    Comment:        The GeneXpert MRSA Assay (FDA approved for NASAL specimens only), is one component of a comprehensive MRSA colonization surveillance program. It is not intended to diagnose MRSA infection nor to guide or monitor treatment for MRSA infections. Performed at [facility], [place] [address], [place], [place] [address]          Radiology Studies: Ct Angio Chest Pe W And/or Wo Contrast  Result Date: 11/06/2018 CLINICAL DATA:  Chest pain, dyspnea, fever EXAM: CT ANGIOGRAPHY CHEST WITH CONTRAST TECHNIQUE: Multidetector CT imaging of the chest was performed using the standard protocol during bolus administration of intravenous contrast. Multiplanar CT image reconstructions and MIPs were obtained to evaluate the vascular anatomy. CONTRAST:  13m OMNIPAQUE IOHEXOL 350 MG/ML SOLN COMPARISON:  Same day chest radiograph, CT chest, 03/06/2016 FINDINGS: Cardiovascular: Satisfactory opacification of the pulmonary arteries to the segmental level. No evidence of pulmonary embolism. Cardiomegaly and coronary artery calcifications. No pericardial effusion. Mediastinum/Nodes: Numerous bulky mediastinal and bilateral hilar lymph nodes, largest right  paratracheal nodes measuring at least 2.6 cm in short axis. Thyroid gland, trachea, and esophagus demonstrate no significant findings. Lungs/Pleura: There are dense consolidative opacities of the superior segments of the lower lobes bilaterally (series 10, image 69). Additional scattered opacities bilaterally. No pleural effusion or pneumothorax. Upper Abdomen: No acute abnormality. Musculoskeletal: No chest wall abnormality. No acute or significant osseous findings. Review of the MIP images confirms the above findings. IMPRESSION: 1.  Negative examination for pulmonary embolism. 2. There are dense consolidative opacities of the superior segments of the lower lobes bilaterally (series 10, image 69). Additional scattered opacities bilaterally. Findings are consistent with multifocal infection. 3. Numerous bulky mediastinal and bilateral hilar lymph nodes, largest right paratracheal nodes measuring at least 2.6 cm in short axis. Although possibly reactive, given size these are concerning for malignancy such as lymphoma. 4.  Coronary artery disease. Electronically Signed   By: AEddie CandleM.D.   On: 11/06/2018 23:52   Dg Chest Port 1 View  Result Date: 11/06/2018 CLINICAL DATA:  Acute onset of chest pain that began yesterday. EXAM: PORTABLE CHEST 1 VIEW COMPARISON:  06/26/2016 and earlier, including CTA chest 06/25/2016. FINDINGS: Suboptimal inspiration accounts for crowded bronchovascular markings, especially in the bases, and accentuates the cardiac silhouette. Taking this into account, cardiac silhouette moderately enlarged, unchanged. Patchy airspace opacities involving the MEDIAL RIGHT lung base. Lungs otherwise clear. Pulmonary vascularity normal without evidence of pulmonary edema. No visible pleural effusions. IMPRESSION: 1. Acute pneumonia involving the MEDIAL RIGHT lung base. 2. Stable cardiomegaly without pulmonary edema. Electronically Signed   By: TEvangeline DakinM.D.   On: 11/06/2018 20:11         Scheduled Meds: . Chlorhexidine Gluconate Cloth  6 each Topical Daily  . enoxaparin (LOVENOX) injection  80 mg Subcutaneous Daily  . insulin aspart  0-9 Units Subcutaneous Q4H  . insulin aspart  5 Units Subcutaneous TID WC  . insulin glargine  10 Units Subcutaneous BID  . insulin starter kit- syringes  1 kit Other Once  . living well with diabetes book   Does not apply Once  . loratadine  10 mg Oral Daily  . mouth rinse  15 mL Mouth Rinse BID  . niacin  250 mg Oral QHS   Continuous Infusions: . sodium chloride 50 mL/hr  at 11/07/18 0236  . azithromycin    . cefTRIAXone (ROCEPHIN)  IV    . vancomycin       LOS: 0 days    Time spent: 30 minutes    Barb Merino, MD [facility] Pager [phone]  If 7PM-7AM, please contact night-coverage [email or web address] Password [facility] 11/07/2018, 10:51 AM

## 2018-11-08 LAB — CBC WITH DIFFERENTIAL/PLATELET
Abs Immature Granulocytes: 0.12 10*3/uL — ABNORMAL HIGH (ref 0.00–0.07)
Basophils Absolute: 0 10*3/uL (ref 0.0–0.1)
Basophils Relative: 0 %
Eosinophils Absolute: 0.1 10*3/uL (ref 0.0–0.5)
Eosinophils Relative: 1 %
HCT: 46.2 % (ref 39.0–52.0)
Hemoglobin: 14.7 g/dL (ref 13.0–17.0)
Immature Granulocytes: 1 %
Lymphocytes Relative: 8 %
Lymphs Abs: 1.1 10*3/uL (ref 0.7–4.0)
MCH: 28.5 pg (ref 26.0–34.0)
MCHC: 31.8 g/dL (ref 30.0–36.0)
MCV: 89.5 fL (ref 80.0–100.0)
Monocytes Absolute: 0.6 10*3/uL (ref 0.1–1.0)
Monocytes Relative: 4 %
Neutro Abs: 11.5 10*3/uL — ABNORMAL HIGH (ref 1.7–7.7)
Neutrophils Relative %: 86 %
Platelets: 137 10*3/uL — ABNORMAL LOW (ref 150–400)
RBC: 5.16 MIL/uL (ref 4.22–5.81)
RDW: 14.4 % (ref 11.5–15.5)
WBC: 13.4 10*3/uL — ABNORMAL HIGH (ref 4.0–10.5)
nRBC: 0 % (ref 0.0–0.2)

## 2018-11-08 LAB — BASIC METABOLIC PANEL
Anion gap: 10 (ref 5–15)
BUN: 7 mg/dL (ref 6–20)
CO2: 25 mmol/L (ref 22–32)
Calcium: 8.2 mg/dL — ABNORMAL LOW (ref 8.9–10.3)
Chloride: 98 mmol/L (ref 98–111)
Creatinine, Ser: 0.72 mg/dL (ref 0.61–1.24)
GFR calc Af Amer: >60 mL/min (ref >60)
GFR calc non Af Amer: >60 mL/min (ref >60)
Glucose, Bld: 235 mg/dL — ABNORMAL HIGH (ref 70–99)
Potassium: 3.8 mmol/L (ref 3.5–5.1)
Sodium: 133 mmol/L — ABNORMAL LOW (ref 135–145)

## 2018-11-08 LAB — GLUCOSE, CAPILLARY
Glucose-Capillary: 252 mg/dL — ABNORMAL HIGH (ref 70–99)
Glucose-Capillary: 245 mg/dL — ABNORMAL HIGH (ref 70–99)
Glucose-Capillary: 341 mg/dL — ABNORMAL HIGH (ref 70–99)
Glucose-Capillary: 231 mg/dL — ABNORMAL HIGH (ref 70–99)
Glucose-Capillary: 276 mg/dL — ABNORMAL HIGH (ref 70–99)

## 2018-11-08 LAB — ANGIOTENSIN CONVERTING ENZYME: Angiotensin-Converting Enzyme: 31 U/L (ref 14–82)

## 2018-11-08 LAB — LACTIC ACID, PLASMA: Lactic Acid, Venous: 1 mmol/L (ref 0.5–1.9)

## 2018-11-08 LAB — HIV ANTIBODY (ROUTINE TESTING W REFLEX): HIV Screen 4th Generation wRfx: NONREACTIVE

## 2018-11-08 LAB — MAGNESIUM: Magnesium: 1.9 mg/dL (ref 1.7–2.4)

## 2018-11-08 MED ORDER — INSULIN ASPART 100 UNIT/ML ~~LOC~~ SOLN
8.0000 [IU] | Freq: Three times a day (TID) | SUBCUTANEOUS | Status: DC
  Administered 2018-11-08 – 2018-11-09 (×4): 8 [IU] via SUBCUTANEOUS

## 2018-11-08 MED ORDER — DIPHENHYDRAMINE HCL 50 MG/ML IJ SOLN
25.0000 mg | Freq: Once | INTRAMUSCULAR | Status: AC
  Administered 2018-11-08: 25 mg via INTRAVENOUS
  Filled 2018-11-08: qty 1

## 2018-11-08 MED ORDER — HYDROCORTISONE 1 % EX CREA
TOPICAL_CREAM | Freq: Every day | CUTANEOUS | Status: DC | PRN
  Administered 2018-11-08: 17:00:00 via TOPICAL
  Administered 2018-11-09: 1 via TOPICAL
  Filled 2018-11-08: qty 28

## 2018-11-08 MED ORDER — INSULIN GLARGINE 100 UNIT/ML ~~LOC~~ SOLN
15.0000 [IU] | Freq: Two times a day (BID) | SUBCUTANEOUS | Status: DC
  Administered 2018-11-08 – 2018-11-09 (×3): 15 [IU] via SUBCUTANEOUS
  Filled 2018-11-08 (×3): qty 0.15

## 2018-11-08 NOTE — Plan of Care (Signed)
Pt still requiring  O2 now at 3LPM. Pt's LLE starting to become painful so new order for Ven Dop to be done for r/o DVT.  Covid test send out still pending

## 2018-11-08 NOTE — Progress Notes (Signed)
Per pt, LLE is starting to feel painful and tight.  Paged Dr. Jerral Ralph and new order for doppler of leg to be done

## 2018-11-08 NOTE — Progress Notes (Signed)
PROGRESS NOTE    Marc Wright  ZOX:[id number] DOB: 01/16/1976 DOA: 11/06/2018 PCP: Patient, No Pcp Per    Brief Narrative:  43 year old gentleman with history of obstructive sleep apnea not using CPAP, history of asthma, chronic congestive heart failure with known ejection fraction of 25 to 30%, type 2 diabetes on metformin.  Poorly controlled chronic medical problems.  Presented to the emergency room with chest pain, left arm pain shortness of breath and dry cough with nausea.  Occasional smoker.  History of pneumonia.  In the emergency room temperature as high as 104.7.  Lactic acid 2.4.  Blood sugars 396. Rapid COVID-19 test negative in the ER. CT chest showed bilateral multifocal pneumonia, negative for PE. Repeat COVID-19 test sent out is pending.   Assessment & Plan:   Principal Problem:   CAP (community acquired pneumonia) Active Problems:   Chronic systolic heart failure (HCC)   HTN (hypertension)   Obstructive sleep apnea   Diabetes (HCC)   Dehydration   Hyperglycemia   Sepsis (HCC)   Lymphadenopathy  Multifocal bacterial pneumonia with sepsis: Most likely community-acquired pneumonia.  Remains on Rocephin and azithromycin and vancomycin. Blood cultures pending.  Streptococcal and Legionella antigen negative .  Sputum cultures pending. Initial COVID-19 negative, given high suspicion a repeat send out test is done.  Results are pending Continue appropriate precautions for COVID-19 PUI. Keep on oxygen to keep saturations more than 90%. Incentive spirometry, albuterol inhaler.  Start flutter valve therapy Blood pressures are currently adequate.  Adequate peripheral perfusion.  He has received adequate resuscitation with IV fluids.  Lactic acid normalized  Obstructive sleep apnea: Untreated.  Fairly stable now.  Holding off on using CPAP with suspected COVID.  Patient has not used CPAP for at least 2 months now.  Uncontrolled type 2 diabetes: Undertreated with  metformin.  A1c is 13. Will start on long-acting insulin and prandial insulin.  Will need simplified regimen on discharge.  Titrate as per blood sugars in the hospital. Increase dose of long-acting insulin today.  Patient will need hospitalization for next few days, will simplify regimen after knowing total need of insulin next 24 to 48 hours. Patient has no medical or insurance coverage, after knowing total insulin need, will change to 70/30 insulin. We discussed about hemoglobin A1c and patient was educated. Nursing to teach insulin administration.  Hypertension: Blood pressures are currently soft.  Holding all antihypertensive medications including diuretics.  Chronic systolic congestive heart failure: Currently euvolemic.  Continue to hold Lasix.  Lymphadenopathy: Suspected from acute infection.  Patient will need surveillance imaging after discharge.  Full PPE used as per airborne and contact precaution protocol.  DVT prophylaxis: Lovenox Code Status: Full code Family Communication: None.  Patient will talk with family. Disposition Plan: Inpatient stepdown unit.   Consultants:   None.  Procedures:   None.  Antimicrobials:   Rocephin, azithromycin, vancomycin 11/06/2018---   Subjective: Patient was seen and examined.  No overnight events.  He has some cough and not able to produce a sputum.  T-max 102.1.His blood pressures are picking up.  White count is trending down.  Remains on 4 L of oxygen via nasal cannula, however he has severe sleep apnea also.   Objective: Vitals:   11/08/18 0743 11/08/18 0800 11/08/18 0900 11/08/18 0901  BP:   (!) 166/68   Pulse:  (!) 107 88 (!) 101  Resp:   (!) 21 (!) 23  Temp:  98.3 F (36.8 C)    TempSrc:  SpO2: 95% 96% (!) 88% 96%  Weight:      Height:        Intake/Output Summary (Last 24 hours) at 11/08/2018 1020 Last data filed at 11/08/2018 0800 Gross per 24 hour  Intake 2247.9 ml  Output 3350 ml  Net -1102.1 ml    Filed Weights   11/06/18 1947 11/08/18 0400  Weight: (!) 158.8 kg (!) 183 kg    Examination:  Physical Exam  Constitutional: He is oriented to person, place, and time. He appears well-developed. No distress.  Patient is on 4 L oxygen but he is comfortable and 95% saturation.  HENT:  Head: Normocephalic and atraumatic.  Mouth/Throat: No oropharyngeal exudate.  Eyes: Pupils are equal, round, and reactive to light. EOM are normal.  Neck: Normal range of motion. Neck supple.  Cardiovascular: Normal rate, regular rhythm and normal heart sounds.  Slightly tachycardic.  Pulmonary/Chest: Effort normal and breath sounds normal. No respiratory distress.  Abdominal: Soft. Bowel sounds are normal.  Obese and pendulous.  Musculoskeletal:        General: Edema present. No tenderness or deformity.     Comments: Chronic lymphedema both extremities.  Lymphadenopathy:    He has no cervical adenopathy.  Neurological: He is alert and oriented to person, place, and time.  Skin: Skin is warm and dry.  Psychiatric: He has a normal mood and affect. Judgment normal.      Data Reviewed: I have personally reviewed following labs and imaging studies  CBC: Recent Labs  Lab 11/06/18 1948 11/07/18 0003 11/07/18 0239 11/08/18 0531  WBC 12.5* 15.2* 16.5* 13.4*  NEUTROABS  --  13.3*  --  11.5*  HGB 17.6* 14.8 15.1 14.7  HCT 51.8 44.4 46.9 46.2  MCV 85.6 86.7 87.5 89.5  PLT 176 141* 173 137*   Basic Metabolic Panel: Recent Labs  Lab 11/06/18 1948 11/07/18 0003 11/07/18 0239 11/08/18 0531  NA 130*  --  133* 133*  K 4.0  --  4.1 3.8  CL 97*  --  98 98  CO2 23  --  27 25  GLUCOSE 396*  --  341* 235*  BUN 12  --  12 7  CREATININE 0.70  --  0.86 0.72  CALCIUM 8.9  --  8.3* 8.2*  MG  --  1.4* 1.5* 1.9  PHOS  --  1.9* 3.1  --    GFR: Estimated Creatinine Clearance: 204.3 mL/min (by C-G formula based on SCr of 0.72 mg/dL). Liver Function Tests: Recent Labs  Lab 11/07/18 0003 11/07/18  0239  AST 19 15  ALT 15 18  ALKPHOS 81 89  BILITOT 0.8 0.8  PROT 5.7* 6.3*  ALBUMIN 3.0* 3.5   Recent Labs  Lab 11/06/18 2008  LIPASE 43   No results for input(s): AMMONIA in the last 168 hours. Coagulation Profile: Recent Labs  Lab 11/06/18 1948  INR 0.9   Cardiac Enzymes: Recent Labs  Lab 11/06/18 1948 11/07/18 0003 11/07/18 0008 11/07/18 0534 11/07/18 1240  CKTOTAL  --  32*  --   --   --   TROPONINI <0.03  --  <0.03 <0.03 <0.03   BNP (last 3 results) No results for input(s): PROBNP in the last 8760 hours. HbA1C: Recent Labs    11/07/18 0239  HGBA1C 13.3*   CBG: Recent Labs  Lab 11/07/18 1156 11/07/18 1602 11/07/18 2033 11/07/18 2347 11/08/18 0446  GLUCAP 304* 255* 283* 245* 252*   Lipid Profile: No results for input(s): CHOL, HDL, LDLCALC, TRIG,  CHOLHDL, LDLDIRECT in the last 72 hours. Thyroid Function Tests: Recent Labs    11/07/18 0239  TSH 0.598   Anemia Panel: Recent Labs    11/07/18 0006  FERRITIN 94   Sepsis Labs: Recent Labs  Lab 11/06/18 2010 11/07/18 0008 11/07/18 0239 11/07/18 0534 11/08/18 0531  PROCALCITON  --  2.98 3.99  --   --   LATICACIDVEN 2.4*  --  2.9* 2.2* 1.0    Recent Results (from the past 240 hour(s))  SARS Coronavirus 2 (CEPHEID- Performed in [facility] hospital lab), Hosp Order     Status: None   Collection Time: 11/06/18  7:56 PM  Result Value Ref Range Status   SARS Coronavirus 2 NEGATIVE NEGATIVE Final    Comment: (NOTE) If result is NEGATIVE SARS-CoV-2 target nucleic acids are NOT DETECTED. The SARS-CoV-2 RNA is generally detectable in upper and lower  respiratory specimens during the acute phase of infection. The lowest  concentration of SARS-CoV-2 viral copies this assay can detect is 250  copies / mL. A negative result does not preclude SARS-CoV-2 infection  and should not be used as the sole basis for treatment or other  patient management decisions.  A negative result may occur with   improper specimen collection / handling, submission of specimen other  than nasopharyngeal swab, presence of viral mutation(s) within the  areas targeted by this assay, and inadequate number of viral copies  (<250 copies / mL). A negative result must be combined with clinical  observations, patient history, and epidemiological information. If result is POSITIVE SARS-CoV-2 target nucleic acids are DETECTED. The SARS-CoV-2 RNA is generally detectable in upper and lower  respiratory specimens dur ing the acute phase of infection.  Positive  results are indicative of active infection with SARS-CoV-2.  Clinical  correlation with patient history and other diagnostic information is  necessary to determine patient infection status.  Positive results do  not rule out bacterial infection or co-infection with other viruses. If result is PRESUMPTIVE POSTIVE SARS-CoV-2 nucleic acids MAY BE PRESENT.   A presumptive positive result was obtained on the submitted specimen  and confirmed on repeat testing.  While 2019 novel coronavirus  (SARS-CoV-2) nucleic acids may be present in the submitted sample  additional confirmatory testing may be necessary for epidemiological  and / or clinical management purposes  to differentiate between  SARS-CoV-2 and other Sarbecovirus currently known to infect humans.  If clinically indicated additional testing with an alternate test  methodology [phone]) is advised. The SARS-CoV-2 RNA is generally  detectable in upper and lower respiratory sp ecimens during the acute  phase of infection. The expected result is Negative. Fact Sheet for Patients:  BoilerBrush.com.cy Fact Sheet for Healthcare Providers: https://pope.com/ This test is not yet approved or cleared by the [place] FDA and has been authorized for detection and/or diagnosis of SARS-CoV-2 by FDA under an Emergency Use Authorization (EUA).  This EUA will  remain in effect (meaning this test can be used) for the duration of the COVID-19 declaration under Section 564(b)(1) of the Act, 21 U.S.C. section 360bbb-3(b)(1), unless the authorization is terminated or revoked sooner. Performed at [facility], [address] [place], [place] [address]   Respiratory Panel by PCR     Status: None   Collection Time: 11/06/18  7:56 PM  Result Value Ref Range Status   Adenovirus NOT DETECTED NOT DETECTED Final   Coronavirus 229E NOT DETECTED NOT DETECTED Final    Comment: (NOTE) The Coronavirus  on the Respiratory Panel, DOES NOT test for the novel  Coronavirus (2019 nCoV)    Coronavirus HKU1 NOT DETECTED NOT DETECTED Final   Coronavirus NL63 NOT DETECTED NOT DETECTED Final   Coronavirus OC43 NOT DETECTED NOT DETECTED Final   Metapneumovirus NOT DETECTED NOT DETECTED Final   Rhinovirus / Enterovirus NOT DETECTED NOT DETECTED Final   Influenza A NOT DETECTED NOT DETECTED Final   Influenza B NOT DETECTED NOT DETECTED Final   Parainfluenza Virus 1 NOT DETECTED NOT DETECTED Final   Parainfluenza Virus 2 NOT DETECTED NOT DETECTED Final   Parainfluenza Virus 3 NOT DETECTED NOT DETECTED Final   Parainfluenza Virus 4 NOT DETECTED NOT DETECTED Final   Respiratory Syncytial Virus NOT DETECTED NOT DETECTED Final   Bordetella pertussis NOT DETECTED NOT DETECTED Final   Chlamydophila pneumoniae NOT DETECTED NOT DETECTED Final   Mycoplasma pneumoniae NOT DETECTED NOT DETECTED Final    Comment: Performed at [facility], [address] [place], [place] [address]  MRSA PCR Screening     Status: None   Collection Time: 11/07/18 12:15 AM  Result Value Ref Range Status   MRSA by PCR NEGATIVE NEGATIVE Final    Comment:        The GeneXpert MRSA Assay (FDA approved for NASAL specimens only), is one component of a comprehensive MRSA colonization surveillance program. It is not intended to diagnose MRSA infection nor to guide or  monitor treatment for MRSA infections. Performed at [facility], [address], [place], [place] [address]   SARS Coronavirus 2 (CEPHEID- Performed in [facility] hospital lab), Hosp Order     Status: None   Collection Time: 11/07/18  9:10 AM  Result Value Ref Range Status   SARS Coronavirus 2 NEGATIVE NEGATIVE Final    Comment: (NOTE) If result is NEGATIVE SARS-CoV-2 target nucleic acids are NOT DETECTED. The SARS-CoV-2 RNA is generally detectable in upper and lower  respiratory specimens during the acute phase of infection. The lowest  concentration of SARS-CoV-2 viral copies this assay can detect is 250  copies / mL. A negative result does not preclude SARS-CoV-2 infection  and should not be used as the sole basis for treatment or other  patient management decisions.  A negative result may occur with  improper specimen collection / handling, submission of specimen other  than nasopharyngeal swab, presence of viral mutation(s) within the  areas targeted by this assay, and inadequate number of viral copies  (<250 copies / mL). A negative result must be combined with clinical  observations, patient history, and epidemiological information. If result is POSITIVE SARS-CoV-2 target nucleic acids are DETECTED. The SARS-CoV-2 RNA is generally detectable in upper and lower  respiratory specimens dur ing the acute phase of infection.  Positive  results are indicative of active infection with SARS-CoV-2.  Clinical  correlation with patient history and other diagnostic information is  necessary to determine patient infection status.  Positive results do  not rule out bacterial infection or co-infection with other viruses. If result is PRESUMPTIVE POSTIVE SARS-CoV-2 nucleic acids MAY BE PRESENT.   A presumptive positive result was obtained on the submitted specimen  and confirmed on repeat testing.  While 2019 novel coronavirus  (SARS-CoV-2) nucleic acids may be present  in the submitted sample  additional confirmatory testing may be necessary for epidemiological  and / or clinical management purposes  to differentiate between  SARS-CoV-2 and other Sarbecovirus currently known to infect humans.  If clinically indicated  additional testing with an alternate test  methodology [phone]) is advised. The SARS-CoV-2 RNA is generally  detectable in upper and lower respiratory sp ecimens during the acute  phase of infection. The expected result is Negative. Fact Sheet for Patients:  BoilerBrush.com.cy Fact Sheet for Healthcare Providers: https://pope.com/ This test is not yet approved or cleared by the [place] FDA and has been authorized for detection and/or diagnosis of SARS-CoV-2 by FDA under an Emergency Use Authorization (EUA).  This EUA will remain in effect (meaning this test can be used) for the duration of the COVID-19 declaration under Section 564(b)(1) of the Act, 21 U.S.C. section 360bbb-3(b)(1), unless the authorization is terminated or revoked sooner. Performed at [facility], [address] [place], [place] [address]   Culture, sputum-assessment     Status: None   Collection Time: 11/07/18 12:55 PM  Result Value Ref Range Status   Specimen Description SPUTUM  Final   Special Requests Normal  Final   Sputum evaluation   Final    THIS SPECIMEN IS ACCEPTABLE FOR SPUTUM CULTURE Performed at [facility], [address], [place], [place] [address]    Report Status 11/07/2018 FINAL  Final  Culture, respiratory     Status: None (Preliminary result)   Collection Time: 11/07/18 12:55 PM  Result Value Ref Range Status   Specimen Description   Final    SPUTUM Performed at [facility], [address], [place], [place] [address]    Special Requests   Final    Normal Reflexed from [id number] Performed at [facility], [address] [place], [place] [address]    Gram Stain   Final    RARE WBC PRESENT, PREDOMINANTLY PMN FEW YEAST RARE GRAM POSITIVE RODS RARE GRAM NEGATIVE RODS RARE GRAM POSITIVE COCCI IN PAIRS Performed at [facility], [address], [place], [place] [address]    Culture PENDING  Incomplete   Report Status PENDING  Incomplete         Radiology Studies: Ct Angio Chest Pe W And/or Wo Contrast  Result Date: 11/06/2018 CLINICAL DATA:  Chest pain, dyspnea, fever EXAM: CT ANGIOGRAPHY CHEST WITH CONTRAST TECHNIQUE: Multidetector CT imaging of the chest was performed using the standard protocol during bolus administration of intravenous contrast. Multiplanar CT image reconstructions and MIPs were obtained to evaluate the vascular anatomy. CONTRAST:  OMNIPAQUE IOHEXOL 350 MG/ML SOLN COMPARISON:  Same day chest radiograph, CT chest, 03/06/2016 FINDINGS: Cardiovascular: Satisfactory opacification of the pulmonary arteries to the segmental level. No evidence of pulmonary embolism. Cardiomegaly and coronary artery calcifications. No pericardial effusion. Mediastinum/Nodes: Numerous bulky mediastinal and bilateral hilar lymph nodes, largest right paratracheal nodes measuring at least 2.6 cm in short axis. Thyroid gland, trachea, and esophagus demonstrate no significant findings. Lungs/Pleura: There are dense consolidative opacities of the superior segments of the lower lobes bilaterally (series 10, image 69). Additional scattered opacities bilaterally. No pleural effusion or pneumothorax. Upper Abdomen: No acute abnormality. Musculoskeletal: No chest wall abnormality. No acute or significant osseous findings. Review of the MIP images confirms the above findings. IMPRESSION: 1.  Negative examination for pulmonary embolism. 2. There are dense consolidative opacities of the superior segments of the lower lobes bilaterally (series 10, image 69). Additional scattered opacities bilaterally. Findings are  consistent with multifocal infection. 3. Numerous bulky mediastinal and bilateral hilar lymph nodes, largest right paratracheal nodes measuring at least 2.6 cm in short axis. Although possibly reactive, given size these  are concerning for malignancy such as lymphoma. 4.  Coronary artery disease. Electronically Signed   By: Lauralyn PrimesAlex  Bibbey M.D.   On: 11/06/2018 23:52   Dg Chest Port 1 View  Result Date: 11/06/2018 CLINICAL DATA:  Acute onset of chest pain that began yesterday. EXAM: PORTABLE CHEST 1 VIEW COMPARISON:  06/26/2016 and earlier, including CTA chest 06/25/2016. FINDINGS: Suboptimal inspiration accounts for crowded bronchovascular markings, especially in the bases, and accentuates the cardiac silhouette. Taking this into account, cardiac silhouette moderately enlarged, unchanged. Patchy airspace opacities involving the MEDIAL RIGHT lung base. Lungs otherwise clear. Pulmonary vascularity normal without evidence of pulmonary edema. No visible pleural effusions. IMPRESSION: 1. Acute pneumonia involving the MEDIAL RIGHT lung base. 2. Stable cardiomegaly without pulmonary edema. Electronically Signed   By: Hulan Saashomas  Lawrence M.D.   On: 11/06/2018 20:11        Scheduled Meds: . Chlorhexidine Gluconate Cloth  6 each Topical Daily  . enoxaparin (LOVENOX) injection  80 mg Subcutaneous Daily  . insulin aspart  0-9 Units Subcutaneous Q4H  . insulin aspart  8 Units Subcutaneous TID WC  . insulin glargine  15 Units Subcutaneous BID  . loratadine  10 mg Oral Daily  . magnesium oxide  800 mg Oral BID  . mouth rinse  15 mL Mouth Rinse BID  . niacin  250 mg Oral QHS   Continuous Infusions: . azithromycin Stopped (11/07/18 2233)  . cefTRIAXone (ROCEPHIN)  IV Stopped (11/07/18 2107)  . vancomycin 2,000 mg (11/08/18 0907)     LOS: 1 day    Time spent: 25 minutes    Dorcas CarrowKuber Arvo Ealy, MD [facility] Pager [phone]  If 7PM-7AM, please contact night-coverage [email or web address] Password [email or web address]  11/08/2018, 10:20 AM

## 2018-11-08 NOTE — TOC Initial Note (Signed)
Transition of Care [facility]) - Initial/Assessment Note    Patient Details  Name: Marc Wright MRN: [id number] Date of Birth: [birth year]  [facility] CM/SW Contact:    Armanda Heritage, RN Phone Number: 11/08/2018, 10:33 AM  Clinical Narrative:   CM consult for medication assistance.  CM spoke with patient, who reports he does not have a pcp.  CM offered [facility] services for PCP and pt can fill prescription needs through the onsite pharmacy.  Information for [facility] placed on discharge paperwork.  Patient made aware that [facility] is a Monday-Friday clinic and services are not available on the weekend. Should pt d/c over the weekend, will need a Match letter for medication assistance.                   Expected Discharge Plan: Home/Self Care Barriers to Discharge: Continued Medical Work up   Patient Goals and CMS Choice Patient states their goals for this hospitalization and ongoing recovery are:: get rid of whatever is in my lungs      Expected Discharge Plan and Services Expected Discharge Plan: Home/Self Care   Discharge Planning Services: CM Consult                                          Prior Living Arrangements/Services   Lives with:: Self Patient language and need for interpreter reviewed:: Yes Do you feel safe going back to the place where you live?: Yes      Need for Family Participation in Patient Care: Yes (Comment) Care giver support system in place?: Yes (comment)   Criminal Activity/Legal Involvement Pertinent to Current Situation/Hospitalization: No - Comment as needed  Activities of Daily Living Home Assistive Devices/Equipment: None ADL Screening (condition at time of admission) Patient's cognitive ability adequate to safely complete daily activities?: Yes Is the patient deaf or have difficulty hearing?: No Does the patient have difficulty seeing, even when wearing glasses/contacts?: No Does the patient have difficulty concentrating,  remembering, or making decisions?: No Patient able to express need for assistance with ADLs?: Yes Does the patient have difficulty dressing or bathing?: No Independently performs ADLs?: Yes (appropriate for developmental age) Does the patient have difficulty walking or climbing stairs?: No Weakness of Legs: None Weakness of Arms/Hands: None  Permission Sought/Granted                  Emotional Assessment Appearance:: Appears stated age Attitude/Demeanor/Rapport: Engaged Affect (typically observed): Accepting Orientation: : Oriented to Self, Oriented to Place, Oriented to  Time, Oriented to Situation Alcohol / Substance Use: Tobacco Use, Alcohol Use Psych Involvement: No (comment)  Admission diagnosis:  Chest pain [R07.9] Community acquired pneumonia of right lower lobe of lung (HCC) [J18.1] Sepsis without acute organ dysfunction, due to unspecified organism [facility]) [A41.9] Patient Active Problem List   Diagnosis Date Noted  . Lymphadenopathy 11/07/2018  . Dehydration 11/06/2018  . Hyperglycemia 11/06/2018  . Sepsis (HCC) 11/06/2018  . CAP (community acquired pneumonia) 11/06/2018  . Chronic systolic heart failure (HCC) 07/23/2016  . HTN (hypertension) 07/23/2016  . Obstructive sleep apnea 07/23/2016  . Tobacco use 07/23/2016  . Diabetes (HCC) 07/23/2016   PCP:  Patient, No Pcp Per Pharmacy:   [facility] [address] [place] [address] [place] [address] Phone: [phone] Fax: [phone]     Social Determinants of Health (  SDOH) Interventions    Readmission Risk Interventions No flowsheet data found.

## 2018-11-08 NOTE — Progress Notes (Signed)
Inpatient Diabetes Program Recommendations  AACE/ADA: New Consensus Statement on Inpatient Glycemic Control (2015)  Target Ranges:  Prepandial:   less than 140 mg/dL      Peak postprandial:   less than 180 mg/dL (1-2 hours)      Critically ill patients:  140 - 180 mg/dL   Results for JESSIAH, HIPPLE (MRN [id number]) as of 11/08/2018 09:43  Ref. Range 11/06/2018 22:06 11/06/2018 22:53 11/07/2018 04:05 11/07/2018 09:05 11/07/2018 11:56 11/07/2018 16:02 11/07/2018 20:33 11/07/2018 23:47  Glucose-Capillary Latest Ref Range: 70 - 99 mg/dL 248 (H) 250 (H)  10 units NOVOLOG  357 (H)  9 units NOVOLOG  275 (H)  5 units NOVOLOG  304 (H)  12 units NOVOLOG +  10 units LANTUS  255 (H)  10 units NOVOLOG  283 (H)  5 units NOVOLOG +  10 units LANTUS  245 (H)  3 units NOVOLOG     Results for BLU, HEWATT (MRN [id number]) as of 11/08/2018 09:43  Ref. Range 11/08/2018 04:46  Glucose-Capillary Latest Ref Range: 70 - 99 mg/dL 169 (H)  5 units NOVOLOG     Results for WADDELL, SCHLESSER (MRN [id number]) as of 11/08/2018 09:43  Ref. Range 11/07/2018 02:39  Hemoglobin A1C Latest Ref Range: 4.8 - 5.6 % 13.3 (H)  (335 mg/dl)    Admit with: CP/ Pneumonia/ Sepsis  History: DM, CHF  Home DM Meds: Metformin 500 mg BID  Current Orders: Lantus 15 units BID      Novolog Sensitive Correction Scale/ SSI (0-9 units) Q4 hours      Novolog 8 units TID with meals     MD- Note Lantus and Novolog Meal Coverage both increased this AM.  Patient does not have insurance and will likely do better with more affordable insulin regimen for home.  Please consider converting patient to 70/30 Insulin in hospital to help determine home doses that will work for home.  70/30 Insulin can be purchased for $25 per vial or $40 for insulin pens at [employer] without insurance.  Recommend the following:  1. Change Novolog SSI to tid ac + hs (currentlly ordered Q4 hours)  2. Stop Lantus and Novolog Meal Coverage  3. Start  70/30 Insulin tonight at Dinner--Recommend 70/30 Insulin 25 units BID to start (this will provide pt with a total of 35 units basal insulin divided between 2 doses and 7.5 units quick insulin BID with Breakfast and Dinner).      Not checking CBGs at home per MD notes.  Has CBG meter at home in [place] where he normally lives (living with brother in [place] right now during quarantine).  Currently does NOT have insurance, so will need to keep cost considerations in mind when prescribing insulin at time of discharge.  No PCP listed--No Insurance--Will place Care Management consult for assistance.    --Will follow patient during hospitalization--  Ambrose Finland RN, MSN, CDE Diabetes Coordinator Inpatient Glycemic Control Team Team Pager: [phone] (8a-5p)

## 2018-11-08 NOTE — Progress Notes (Signed)
Pt c/o feeling "like he was on fire" and burning after medication started. Medication immediately stopped upon entering room, NS was used to flush line. Pt face chest and back were red, and had small sandpaper like rash throughout his body. No wheezing. Did not express trouble breathing, respirations remained unlabored and regular. On call provider notified and benadryl was ordered and administered by RN. A cool washcloth was used on the patients skin to help alleviate heat and itching. Pt was in stable condition, and stated he was feeling much better upon RN exit. Pt informed to notify RN with any concerns; especially continued itching, redness, swelling, hives, hoarseness or any breathing difficulties. Will continue to monitor closely

## 2018-11-09 ENCOUNTER — Inpatient Hospital Stay (HOSPITAL_COMMUNITY): Payer: Self-pay

## 2018-11-09 DIAGNOSIS — L0390 Cellulitis, unspecified: Secondary | ICD-10-CM | POA: Diagnosis not present

## 2018-11-09 DIAGNOSIS — R52 Pain, unspecified: Secondary | ICD-10-CM

## 2018-11-09 LAB — CULTURE, RESPIRATORY W GRAM STAIN
Culture: NORMAL
Special Requests: NORMAL

## 2018-11-09 LAB — CBC WITH DIFFERENTIAL/PLATELET
Abs Immature Granulocytes: 0.08 10*3/uL — ABNORMAL HIGH (ref 0.00–0.07)
Basophils Absolute: 0 10*3/uL (ref 0.0–0.1)
Basophils Relative: 0 %
Eosinophils Absolute: 0.2 10*3/uL (ref 0.0–0.5)
Eosinophils Relative: 2 %
HCT: 42.6 % (ref 39.0–52.0)
Hemoglobin: 13.7 g/dL (ref 13.0–17.0)
Immature Granulocytes: 1 %
Lymphocytes Relative: 10 %
Lymphs Abs: 1 10*3/uL (ref 0.7–4.0)
MCH: 29 pg (ref 26.0–34.0)
MCHC: 32.2 g/dL (ref 30.0–36.0)
MCV: 90.3 fL (ref 80.0–100.0)
Monocytes Absolute: 0.8 10*3/uL (ref 0.1–1.0)
Monocytes Relative: 7 %
Neutro Abs: 8 10*3/uL — ABNORMAL HIGH (ref 1.7–7.7)
Neutrophils Relative %: 80 %
Platelets: 125 10*3/uL — ABNORMAL LOW (ref 150–400)
RBC: 4.72 MIL/uL (ref 4.22–5.81)
RDW: 14.4 % (ref 11.5–15.5)
WBC: 10.1 10*3/uL (ref 4.0–10.5)
nRBC: 0 % (ref 0.0–0.2)

## 2018-11-09 LAB — GLUCOSE, CAPILLARY
Glucose-Capillary: 235 mg/dL — ABNORMAL HIGH (ref 70–99)
Glucose-Capillary: 237 mg/dL — ABNORMAL HIGH (ref 70–99)
Glucose-Capillary: 235 mg/dL — ABNORMAL HIGH (ref 70–99)
Glucose-Capillary: 218 mg/dL — ABNORMAL HIGH (ref 70–99)
Glucose-Capillary: 257 mg/dL — ABNORMAL HIGH (ref 70–99)
Glucose-Capillary: 317 mg/dL — ABNORMAL HIGH (ref 70–99)

## 2018-11-09 LAB — NOVEL CORONAVIRUS, NAA (HOSP ORDER, SEND-OUT TO REF LAB; TAT 18-24 HRS): SARS-CoV-2, NAA: NOT DETECTED

## 2018-11-09 LAB — BASIC METABOLIC PANEL
Anion gap: 9 (ref 5–15)
BUN: 8 mg/dL (ref 6–20)
CO2: 26 mmol/L (ref 22–32)
Calcium: 8.3 mg/dL — ABNORMAL LOW (ref 8.9–10.3)
Chloride: 99 mmol/L (ref 98–111)
Creatinine, Ser: 0.62 mg/dL (ref 0.61–1.24)
GFR calc Af Amer: >60 mL/min (ref >60)
GFR calc non Af Amer: >60 mL/min (ref >60)
Glucose, Bld: 227 mg/dL — ABNORMAL HIGH (ref 70–99)
Potassium: 3.9 mmol/L (ref 3.5–5.1)
Sodium: 134 mmol/L — ABNORMAL LOW (ref 135–145)

## 2018-11-09 LAB — INTERLEUKIN-6, PLASMA: Interleukin-6, Plasma: 1194.2 pg/mL — ABNORMAL HIGH (ref 0.0–12.2)

## 2018-11-09 MED ORDER — LISINOPRIL 2.5 MG PO TABS
5.0000 mg | ORAL_TABLET | Freq: Every day | ORAL | Status: DC
  Administered 2018-11-09: 5 mg via ORAL
  Filled 2018-11-09: qty 2

## 2018-11-09 MED ORDER — FUROSEMIDE 40 MG PO TABS
40.0000 mg | ORAL_TABLET | Freq: Two times a day (BID) | ORAL | Status: DC
  Administered 2018-11-09 – 2018-11-10 (×3): 40 mg via ORAL
  Filled 2018-11-09 (×3): qty 1

## 2018-11-09 MED ORDER — INSULIN GLARGINE 100 UNIT/ML ~~LOC~~ SOLN
20.0000 [IU] | Freq: Two times a day (BID) | SUBCUTANEOUS | Status: DC
  Administered 2018-11-09 – 2018-11-10 (×2): 20 [IU] via SUBCUTANEOUS
  Filled 2018-11-09 (×2): qty 0.2

## 2018-11-09 MED ORDER — INSULIN ASPART 100 UNIT/ML ~~LOC~~ SOLN
0.0000 [IU] | Freq: Three times a day (TID) | SUBCUTANEOUS | Status: DC
  Administered 2018-11-09: 7 [IU] via SUBCUTANEOUS
  Administered 2018-11-09: 3 [IU] via SUBCUTANEOUS
  Administered 2018-11-09 – 2018-11-10 (×2): 5 [IU] via SUBCUTANEOUS
  Administered 2018-11-10: 3 [IU] via SUBCUTANEOUS
  Administered 2018-11-10: 2 [IU] via SUBCUTANEOUS
  Administered 2018-11-10 – 2018-11-11 (×2): 3 [IU] via SUBCUTANEOUS
  Administered 2018-11-11: 2 [IU] via SUBCUTANEOUS

## 2018-11-09 MED ORDER — SALINE SPRAY 0.65 % NA SOLN
1.0000 | NASAL | Status: DC | PRN
  Filled 2018-11-09: qty 44

## 2018-11-09 MED ORDER — SODIUM CHLORIDE 0.9 % IV SOLN
2.0000 g | INTRAVENOUS | Status: DC
  Administered 2018-11-09 – 2018-11-10 (×2): 2 g via INTRAVENOUS
  Filled 2018-11-09: qty 2
  Filled 2018-11-09: qty 20
  Filled 2018-11-09: qty 2
  Filled 2018-11-09: qty 20

## 2018-11-09 MED ORDER — NICOTINE 14 MG/24HR TD PT24
14.0000 mg | MEDICATED_PATCH | Freq: Every day | TRANSDERMAL | Status: DC
  Administered 2018-11-09 – 2018-11-11 (×3): 14 mg via TRANSDERMAL
  Filled 2018-11-09 (×3): qty 1

## 2018-11-09 MED ORDER — INSULIN ASPART 100 UNIT/ML ~~LOC~~ SOLN
12.0000 [IU] | Freq: Three times a day (TID) | SUBCUTANEOUS | Status: DC
  Administered 2018-11-09 – 2018-11-11 (×7): 12 [IU] via SUBCUTANEOUS

## 2018-11-09 NOTE — Progress Notes (Addendum)
Patient requesting nicotine patch. States he smokes about 1/2 pack per day. MD made aware. New orders received. Will continue to monitor.

## 2018-11-09 NOTE — Progress Notes (Signed)
Lower extremity venous has been completed.   Preliminary results in CV Proc.   Blanch Media 11/09/2018 8:40 AM

## 2018-11-09 NOTE — Progress Notes (Signed)
Patient has repeated COVID-19 test negative.  He does have alternative explanation with bacterial pneumonia for his presentation. We will discontinue COVID-19 precautions. Advance activities.  Use CPAP at night.

## 2018-11-09 NOTE — Progress Notes (Signed)
PROGRESS NOTE    Marc Wright  ZOX:[id number] DOB: [birth year] DOA: 11/06/2018 PCP: Patient, No Pcp Per    Brief Narrative:  43 year old gentleman with history of obstructive sleep apnea not using CPAP, history of asthma, chronic congestive heart failure with known ejection fraction of 25 to 30%, type 2 diabetes on metformin.  Poorly controlled chronic medical problems.  Presented to the emergency room with chest pain, left arm pain shortness of breath and dry cough with nausea.  Occasional smoker.  History of pneumonia.  In the emergency room temperature as high as 104.7.  Lactic acid 2.4.  Blood sugars 396. Rapid COVID-19 test negative in the ER. CT chest showed bilateral multifocal pneumonia, negative for PE. Repeat COVID-19 test sent out is pending. Patient had some chronic stasis changes on his both legs, now developed cellulitic changes on left leg.  Duplexes were normal.   Assessment & Plan:   Principal Problem:   CAP (community acquired pneumonia) Active Problems:   Chronic systolic heart failure (HCC)   HTN (hypertension)   Obstructive sleep apnea   Diabetes (HCC)   Dehydration   Hyperglycemia   Sepsis (HCC)   Lymphadenopathy   Cellulitis  Multifocal bacterial pneumonia with sepsis: Most likely community-acquired pneumonia.  Treated with Rocephin, azithromycin and vancomycin.   All cultures are negative and clinically improving.   Vancomycin discontinued with negative cultures.   Developed rash all over the body immediately after third day of azithromycin last night.   Now he has developed cellulitis that looks uncomplicated with no evidence of underlying abscess or necrotizing fasciitis on his left leg.   We will treat with higher dose of Rocephin, increase to 2 g daily.   Repeat COVID-19 test pending, continue all precautions.   Keep on oxygen to keep saturations more than 90%. Incentive spirometry, albuterol inhaler.  flutter valve therapy Blood pressures are  currently adequate.  Adequate peripheral perfusion.  He has received adequate resuscitation with IV fluids.  Lactic acid normalized.   Obstructive sleep apnea: Untreated.  Fairly stable now.  Holding off on using CPAP with suspected COVID.  Patient has not used CPAP for at least 2 months now.  Uncontrolled type 2 diabetes: Undertreated with metformin.  A1c is 13. Uptitrate long-acting insulin and prandial insulin today.  We will try to achieve optimum control in the hospital.   will simplify regimen after knowing total need of insulin next 24 to 48 hours. Patient has no medical or insurance coverage, after knowing total insulin need, will change to 70/30 insulin. We discussed about hemoglobin A1c and patient was educated. Nursing to teach insulin administration.  Hypertension: Blood pressures are currently improving.  Will resume both lisinopril and Lasix today.    Chronic systolic congestive heart failure: Currently euvolemic.  Resume lisinopril and Lasix.  Lymphadenopathy: Patient has lymphadenopathy in his chest.  Probably due to infection. Suspected from acute infection.  Patient will need surveillance imaging after discharge.  Left leg cellulitis: Probably acute infection of chronic lymphedema.  Currently no evidence of complication.  On increasing doses of Rocephin.  Full PPE used as per airborne and contact precaution protocol.  DVT prophylaxis: Lovenox Code Status: Full code Family Communication: None.  Patient will talk with family. Disposition Plan: Inpatient . Ambulate once COVID-19 repeat test resulted.   Consultants:   None.  Procedures:   None.  Antimicrobials:   Rocephin, azithromycin, vancomycin 11/06/2018--- 11/09/2018  Rocephin 2 g daily, 11/09/2018---   Subjective: Patient was seen and examined.  No overnight events.  Afebrile last 24 hours.  Cultures negative.  Developed some leg pain and has asymmetrical swelling and some cellulitic changes on the  left leg. At rest and when awake he is mostly saturating well on room air, however he has severe sleep apnea and becomes hypoxic on sleep and using 4 L.  He is ready to use insulin at home.  Objective: Vitals:   11/09/18 0700 11/09/18 0800 11/09/18 0815 11/09/18 0900  BP: (!) 149/73 (!) 168/116 (!) 158/90 (!) 148/79  Pulse: 82 76  85  Resp: Temp:      TempSrc:      SpO2: 96% 97%  97%  Weight:      Height:        Intake/Output Summary (Last 24 hours) at 11/09/2018 1112 Last data filed at 11/09/2018 0930 Gross per 24 hour  Intake 2288.16 ml  Output 3025 ml  Net -736.84 ml   Filed Weights   11/06/18 1947 11/08/18 0400  Weight: (!) 158.8 kg (!) 183 kg    Examination:  Physical Exam  Constitutional: He is oriented to person, place, and time. He appears well-developed and well-nourished. No distress.  Obese patient laying comfortably in bed.  On 4 L oxygen.  HENT:  Head: Normocephalic and atraumatic.  Mouth/Throat: Oropharynx is clear and moist.  Eyes: Pupils are equal, round, and reactive to light. EOM are normal.  Neck: Normal range of motion. Neck supple.  Cardiovascular: Normal rate and normal heart sounds. Exam reveals no friction rub.  No murmur heard. Pulmonary/Chest: Effort normal and breath sounds normal. No respiratory distress. He has no wheezes. He has no rales.  Abdominal: Soft. Bowel sounds are normal. There is no abdominal tenderness.  Obese and pendulous.  Musculoskeletal: Normal range of motion.        General: Tenderness and edema present. No deformity.     Comments: Chronic venous stasis changes and lymphedema both legs.  Left leg erythematous and slightly increased in size than the right leg.  Lymphadenopathy:    He has no cervical adenopathy.  Neurological: He is alert and oriented to person, place, and time.  Skin: Skin is warm and dry.  Psychiatric: His behavior is normal. Judgment normal.      Data Reviewed: I have personally reviewed  following labs and imaging studies  CBC: Recent Labs  Lab 11/06/18 1948 11/07/18 0003 11/07/18 0239 11/08/18 0531 11/09/18 0328  WBC 12.5* 15.2* 16.5* 13.4* 10.1  NEUTROABS  --  13.3*  --  11.5* 8.0*  HGB 17.6* 14.8 15.1 14.7 13.7  HCT 51.8 44.4 46.9 46.2 42.6  MCV 85.6 86.7 87.5 89.5 90.3  PLT 176 141* 173 137* 125*   Basic Metabolic Panel: Recent Labs  Lab 11/06/18 1948 11/07/18 0003 11/07/18 0239 11/08/18 0531 11/09/18 0328  NA 130*  --  133* 133* 134*  K 4.0  --  4.1 3.8 3.9  CL 97*  --  98 98 99  CO2 23  --  GLUCOSE 396*  --  341* 235* 227*  BUN 12  --  CREATININE 0.70  --  0.86 0.72 0.62  CALCIUM 8.9  --  8.3* 8.2* 8.3*  MG  --  1.4* 1.5* 1.9  --   PHOS  --  1.9* 3.1  --   --    GFR: Estimated Creatinine Clearance: 204.3 mL/min (by C-G formula based on SCr of 0.62 mg/dL). Liver Function  Tests: Recent Labs  Lab 11/07/18 0003 11/07/18 0239  AST 19 15  ALT 15 18  ALKPHOS 81 89  BILITOT 0.8 0.8  PROT 5.7* 6.3*  ALBUMIN 3.0* 3.5   Recent Labs  Lab 11/06/18 2008  LIPASE 43   No results for input(s): AMMONIA in the last 168 hours. Coagulation Profile: Recent Labs  Lab 11/06/18 1948  INR 0.9   Cardiac Enzymes: Recent Labs  Lab 11/06/18 1948 11/07/18 0003 11/07/18 0008 11/07/18 0534 11/07/18 1240  CKTOTAL  --  32*  --   --   --   TROPONINI <0.03  --  <0.03 <0.03 <0.03   BNP (last 3 results) No results for input(s): PROBNP in the last 8760 hours. HbA1C: Recent Labs    11/07/18 0239  HGBA1C 13.3*   CBG: Recent Labs  Lab 11/08/18 1124 11/08/18 1633 11/08/18 2038 11/09/18 0100 11/09/18 0405  GLUCAP 341* 231* 276* 235* 237*   Lipid Profile: No results for input(s): CHOL, HDL, LDLCALC, TRIG, CHOLHDL, LDLDIRECT in the last 72 hours. Thyroid Function Tests: Recent Labs    11/07/18 0239  TSH 0.598   Anemia Panel: Recent Labs    11/07/18 0006  FERRITIN 94   Sepsis Labs: Recent Labs  Lab 11/06/18 2010  11/07/18 0008 11/07/18 0239 11/07/18 0534 11/08/18 0531  PROCALCITON  --  2.98 3.99  --   --   LATICACIDVEN 2.4*  --  2.9* 2.2* 1.0    Recent Results (from the past 240 hour(s))  SARS Coronavirus 2 (CEPHEID- Performed in [facility] hospital lab), Hosp Order     Status: None   Collection Time: 11/06/18  7:56 PM  Result Value Ref Range Status   SARS Coronavirus 2 NEGATIVE NEGATIVE Final    Comment: (NOTE) If result is NEGATIVE SARS-CoV-2 target nucleic acids are NOT DETECTED. The SARS-CoV-2 RNA is generally detectable in upper and lower  respiratory specimens during the acute phase of infection. The lowest  concentration of SARS-CoV-2 viral copies this assay can detect is 250  copies / mL. A negative result does not preclude SARS-CoV-2 infection  and should not be used as the sole basis for treatment or other  patient management decisions.  A negative result may occur with  improper specimen collection / handling, submission of specimen other  than nasopharyngeal swab, presence of viral mutation(s) within the  areas targeted by this assay, and inadequate number of viral copies  (<250 copies / mL). A negative result must be combined with clinical  observations, patient history, and epidemiological information. If result is POSITIVE SARS-CoV-2 target nucleic acids are DETECTED. The SARS-CoV-2 RNA is generally detectable in upper and lower  respiratory specimens dur ing the acute phase of infection.  Positive  results are indicative of active infection with SARS-CoV-2.  Clinical  correlation with patient history and other diagnostic information is  necessary to determine patient infection status.  Positive results do  not rule out bacterial infection or co-infection with other viruses. If result is PRESUMPTIVE POSTIVE SARS-CoV-2 nucleic acids MAY BE PRESENT.   A presumptive positive result was obtained on the submitted specimen  and confirmed on repeat testing.  While 2019 novel  coronavirus  (SARS-CoV-2) nucleic acids may be present in the submitted sample  additional confirmatory testing may be necessary for epidemiological  and / or clinical management purposes  to differentiate between  SARS-CoV-2 and other Sarbecovirus currently known to infect humans.  If clinically indicated additional testing with an alternate test  methodology [phone])  is advised. The SARS-CoV-2 RNA is generally  detectable in upper and lower respiratory sp ecimens during the acute  phase of infection. The expected result is Negative. Fact Sheet for Patients:  BoilerBrush.com.cy Fact Sheet for Healthcare Providers: https://pope.com/ This test is not yet approved or cleared by the [place] FDA and has been authorized for detection and/or diagnosis of SARS-CoV-2 by FDA under an Emergency Use Authorization (EUA).  This EUA will remain in effect (meaning this test can be used) for the duration of the COVID-19 declaration under Section 564(b)(1) of the Act, 21 U.S.C. section 360bbb-3(b)(1), unless the authorization is terminated or revoked sooner. Performed at [facility], [address] [place], [place] [address]   Respiratory Panel by PCR     Status: None   Collection Time: 11/06/18  7:56 PM  Result Value Ref Range Status   Adenovirus NOT DETECTED NOT DETECTED Final   Coronavirus 229E NOT DETECTED NOT DETECTED Final    Comment: (NOTE) The Coronavirus on the Respiratory Panel, DOES NOT test for the novel  Coronavirus (2019 nCoV)    Coronavirus HKU1 NOT DETECTED NOT DETECTED Final   Coronavirus NL63 NOT DETECTED NOT DETECTED Final   Coronavirus OC43 NOT DETECTED NOT DETECTED Final   Metapneumovirus NOT DETECTED NOT DETECTED Final   Rhinovirus / Enterovirus NOT DETECTED NOT DETECTED Final   Influenza A NOT DETECTED NOT DETECTED Final   Influenza B NOT DETECTED NOT DETECTED Final   Parainfluenza Virus 1 NOT  DETECTED NOT DETECTED Final   Parainfluenza Virus 2 NOT DETECTED NOT DETECTED Final   Parainfluenza Virus 3 NOT DETECTED NOT DETECTED Final   Parainfluenza Virus 4 NOT DETECTED NOT DETECTED Final   Respiratory Syncytial Virus NOT DETECTED NOT DETECTED Final   Bordetella pertussis NOT DETECTED NOT DETECTED Final   Chlamydophila pneumoniae NOT DETECTED NOT DETECTED Final   Mycoplasma pneumoniae NOT DETECTED NOT DETECTED Final    Comment: Performed at [facility], [address] [place], [place] [address]  Culture, blood (Routine x 2)     Status: None (Preliminary result)   Collection Time: 11/06/18  9:10 PM  Result Value Ref Range Status   Specimen Description   Final    BLOOD RIGHT HAND Performed at [facility], [address], [place], [place] [address]    Special Requests   Final    BOTTLES DRAWN AEROBIC AND ANAEROBIC Blood Culture adequate volume Performed at [facility], [address] [place], [place] [address]    Culture   Final    NO GROWTH 2 DAYS Performed at [facility], [address] [place], [place] [address]    Report Status PENDING  Incomplete  Culture, blood (Routine x 2)     Status: None (Preliminary result)   Collection Time: 11/07/18 12:03 AM  Result Value Ref Range Status   Specimen Description   Final    BLOOD RIGHT HAND Performed at [facility], [address] [place], [place] [address]    Special Requests   Final    BOTTLES DRAWN AEROBIC ONLY Blood Culture results may not be optimal due to an inadequate volume of blood received in culture bottles Performed at [facility], [address], [place], [place] [address]    Culture   Final    NO GROWTH 2 DAYS Performed at [facility], [address], [place], [place] [address]    Report Status PENDING  Incomplete  MRSA PCR Screening     Status: None   Collection Time: 11/07/18 12:15 AM  Result Value Ref  Range Status   MRSA by PCR NEGATIVE NEGATIVE Final    Comment:        The GeneXpert MRSA Assay (FDA approved for NASAL specimens only), is one component of a comprehensive MRSA colonization surveillance program. It is not intended to diagnose MRSA infection nor to guide or monitor treatment for MRSA infections. Performed at [facility], [address], [place], [place] [address]   SARS Coronavirus 2 (CEPHEID- Performed in [facility] hospital lab), Hosp Order     Status: None   Collection Time: 11/07/18  9:10 AM  Result Value Ref Range Status   SARS Coronavirus 2 NEGATIVE NEGATIVE Final    Comment: (NOTE) If result is NEGATIVE SARS-CoV-2 target nucleic acids are NOT DETECTED. The SARS-CoV-2 RNA is generally detectable in upper and lower  respiratory specimens during the acute phase of infection. The lowest  concentration of SARS-CoV-2 viral copies this assay can detect is 250  copies / mL. A negative result does not preclude SARS-CoV-2 infection  and should not be used as the sole basis for treatment or other  patient management decisions.  A negative result may occur with  improper specimen collection / handling, submission of specimen other  than nasopharyngeal swab, presence of viral mutation(s) within the  areas targeted by this assay, and inadequate number of viral copies  (<250 copies / mL). A negative result must be combined with clinical  observations, patient history, and epidemiological information. If result is POSITIVE SARS-CoV-2 target nucleic acids are DETECTED. The SARS-CoV-2 RNA is generally detectable in upper and lower  respiratory specimens dur ing the acute phase of infection.  Positive  results are indicative of active infection with SARS-CoV-2.  Clinical  correlation with patient history and other diagnostic information is  necessary to determine patient infection status.  Positive results do  not rule out bacterial infection or  co-infection with other viruses. If result is PRESUMPTIVE POSTIVE SARS-CoV-2 nucleic acids MAY BE PRESENT.   A presumptive positive result was obtained on the submitted specimen  and confirmed on repeat testing.  While 2019 novel coronavirus  (SARS-CoV-2) nucleic acids may be present in the submitted sample  additional confirmatory testing may be necessary for epidemiological  and / or clinical management purposes  to differentiate between  SARS-CoV-2 and other Sarbecovirus currently known to infect humans.  If clinically indicated additional testing with an alternate test  methodology [phone]) is advised. The SARS-CoV-2 RNA is generally  detectable in upper and lower respiratory sp ecimens during the acute  phase of infection. The expected result is Negative. Fact Sheet for Patients:  BoilerBrush.com.cy Fact Sheet for Healthcare Providers: https://pope.com/ This test is not yet approved or cleared by the [place] FDA and has been authorized for detection and/or diagnosis of SARS-CoV-2 by FDA under an Emergency Use Authorization (EUA).  This EUA will remain in effect (meaning this test can be used) for the duration of the COVID-19 declaration under Section 564(b)(1) of the Act, 21 U.S.C. section 360bbb-3(b)(1), unless the authorization is terminated or revoked sooner. Performed at [facility], [address], [place], [place] [address]   Culture, sputum-assessment     Status: None   Collection Time: 11/07/18 12:55 PM  Result Value Ref Range Status   Specimen Description SPUTUM  Final   Special Requests Normal  Final   Sputum  evaluation   Final    THIS SPECIMEN IS ACCEPTABLE FOR SPUTUM CULTURE Performed at [facility], [address] [place], [place] [address]    Report Status 11/07/2018 FINAL  Final  Culture, respiratory     Status: None (Preliminary result)   Collection Time: 11/07/18  12:55 PM  Result Value Ref Range Status   Specimen Description   Final    SPUTUM Performed at [facility], [address], [place], [place] [address]    Special Requests   Final    Normal Reflexed from [id number] Performed at [facility], [address] [place], [place] [address]    Gram Stain   Final    RARE WBC PRESENT, PREDOMINANTLY PMN FEW YEAST RARE GRAM POSITIVE RODS RARE GRAM NEGATIVE RODS RARE GRAM POSITIVE COCCI IN PAIRS    Culture   Final    CULTURE REINCUBATED FOR BETTER GROWTH Performed at [facility], [address], [place], [place] [address]    Report Status PENDING  Incomplete         Radiology Studies: Vas [place] Lower Extremity Venous (dvt)  Result Date: 11/09/2018  Lower Venous Study Indications: Pain.  Limitations: Body habitus and poor ultrasound/tissue interface. Performing Technologist: Blanch Media RVS  Examination Guidelines: A complete evaluation includes B-mode imaging, spectral Doppler, color Doppler, and power Doppler as needed of all accessible portions of each vessel. Bilateral testing is considered an integral part of a complete examination. Limited examinations for reoccurring indications may be performed as noted.  +---------+---------------+---------+-----------+----------+--------------+  RIGHT     Compressibility Phasicity Spontaneity Properties Summary         +---------+---------------+---------+-----------+----------+--------------+  CFV       Full            Yes       Yes                                    +---------+---------------+---------+-----------+----------+--------------+  SFJ       Full                                                             +---------+---------------+---------+-----------+----------+--------------+  FV Prox   Full                                                             +---------+---------------+---------+-----------+----------+--------------+  FV Mid    Full                                                              +---------+---------------+---------+-----------+----------+--------------+  FV Distal Full                                                             +---------+---------------+---------+-----------+----------+--------------+  PFV       Full                                                             +---------+---------------+---------+-----------+----------+--------------+  POP       Full            Yes       Yes                                    +---------+---------------+---------+-----------+----------+--------------+  PTV       Full                                                             +---------+---------------+---------+-----------+----------+--------------+  PERO                                                       Not visualized  +---------+---------------+---------+-----------+----------+--------------+   +---------+---------------+---------+-----------+----------+--------------+  LEFT      Compressibility Phasicity Spontaneity Properties Summary         +---------+---------------+---------+-----------+----------+--------------+  CFV       Full            Yes       Yes                                    +---------+---------------+---------+-----------+----------+--------------+  SFJ       Full                                                             +---------+---------------+---------+-----------+----------+--------------+  FV Prox   Full                                                             +---------+---------------+---------+-----------+----------+--------------+  FV Mid    Full                                                             +---------+---------------+---------+-----------+----------+--------------+  FV Distal Full                                                             +---------+---------------+---------+-----------+----------+--------------+  PFV       Full                                                              +---------+---------------+---------+-----------+----------+--------------+  POP       Full            Yes       Yes                                    +---------+---------------+---------+-----------+----------+--------------+  PTV                                                        Not visualized  +---------+---------------+---------+-----------+----------+--------------+  PERO                                                       Not visualized  +---------+---------------+---------+-----------+----------+--------------+     Summary: Right: There is no evidence of deep vein thrombosis in the lower extremity. However, portions of this examination were limited- see technologist comments above. No cystic structure found in the popliteal fossa. Left: There is no evidence of deep vein thrombosis in the lower extremity. However, portions of this examination were limited- see technologist comments above. No cystic structure found in the popliteal fossa.  *See table(s) above for measurements and observations.    Preliminary         Scheduled Meds:  Chlorhexidine Gluconate Cloth  6 each Topical Daily   enoxaparin (LOVENOX) injection  80 mg Subcutaneous Daily   insulin aspart  0-9 Units Subcutaneous TID AC & HS   insulin aspart  12 Units Subcutaneous TID WC   insulin glargine  20 Units Subcutaneous BID   loratadine  10 mg Oral Daily   magnesium oxide  800 mg Oral BID   mouth rinse  15 mL Mouth Rinse BID   niacin  250 mg Oral QHS   Continuous Infusions:  cefTRIAXone (ROCEPHIN)  IV       LOS: 2 days    Time spent: 25 minutes    Dorcas Carrow, MD [facility] Pager [phone]  If 7PM-7AM, please contact night-coverage [email or web address] Password [email or web address] 11/09/2018, 11:12 AM

## 2018-11-09 NOTE — Progress Notes (Addendum)
Inpatient Diabetes Program Recommendations  AACE/ADA: New Consensus Statement on Inpatient Glycemic Control (2015)  Target Ranges:  Prepandial:   less than 140 mg/dL      Peak postprandial:   less than 180 mg/dL (1-2 hours)      Critically ill patients:  140 - 180 mg/dL   Results for DARLE, NEWBERG (MRN [id number]) as of 11/09/2018 07:19  Ref. Range 11/07/2018 23:47 11/08/2018 04:46 11/08/2018 08:11 11/08/2018 11:24 11/08/2018 16:33 11/08/2018 20:38  Glucose-Capillary Latest Ref Range: 70 - 99 mg/dL 038 (H)  3 units NOVOLOG  252 (H)  5 units NOVOLOG  245 (H)  11 units NOVOLOG +  15 units LANTUS given at 9am  341 (H)  15 units NOVOLOG  231 (H)  11 units NOVOLOG  276 (H)  5 units NOVOLOG +  15 units LANTUS given at 10pm    Results for RHOEN, LANGILL (MRN [id number]) as of 11/09/2018 07:19  Ref. Range 11/09/2018 01:00 11/09/2018 04:05  Glucose-Capillary Latest Ref Range: 70 - 99 mg/dL 179 (H)  3 units NOVOLOG  237 (H)   3 units NOVOLOG     Admit with: CP/ Pneumonia/ Sepsis  History: DM, CHF  Home DM Meds: Metformin 500 mg BID  Current Orders: Lantus 15 units BID                            Novolog Sensitive Correction Scale/ SSI (0-9 units) Q4 hours                            Novolog 8 units TID with meals       MD- Note Lantus and Novolog Meal Coverage both increased yesterday AM.  Patient does not have insurance and will likely do better with more affordable insulin regimen for home.  Please consider converting patient to 70/30 Insulin at some point during hospitalization to help determine home doses that will work for him.  70/30 Insulin can be purchased for $25 per vial or $40 for insulin pens at [facility] without insurance.  Recommend the following for now:  1. Change Novolog SSI to tid ac + hs (currentlly ordered Q4 hours)--Pt eating PO diet  2. Increase Lantus to 20 units BID  3. Increase Novolog Meal Coverage to: Novolog 12 units TID with  meals     Not checking CBGs at home per MD notes.  Has CBG meter at home in [place] where he normally lives (living with brother in [place] right now during quarantine).  Currently does NOT have insurance, so will need to keep cost considerations in mind when prescribing insulin at time of discharge.  No PCP listed--No Insurance--Will place Care Management consult for assistance.     --Will follow patient during hospitalization--  Ambrose Finland RN, MSN, CDE Diabetes Coordinator Inpatient Glycemic Control Team Team Pager: [phone] (8a-5p)

## 2018-11-10 LAB — GLUCOSE, CAPILLARY
Glucose-Capillary: 211 mg/dL — ABNORMAL HIGH (ref 70–99)
Glucose-Capillary: 177 mg/dL — ABNORMAL HIGH (ref 70–99)
Glucose-Capillary: 242 mg/dL — ABNORMAL HIGH (ref 70–99)
Glucose-Capillary: 266 mg/dL — ABNORMAL HIGH (ref 70–99)
Glucose-Capillary: 204 mg/dL — ABNORMAL HIGH (ref 70–99)
Glucose-Capillary: 189 mg/dL — ABNORMAL HIGH (ref 70–99)

## 2018-11-10 MED ORDER — INSULIN ASPART PROT & ASPART (70-30 MIX) 100 UNIT/ML ~~LOC~~ SUSP
30.0000 [IU] | Freq: Two times a day (BID) | SUBCUTANEOUS | Status: DC
  Administered 2018-11-10 – 2018-11-11 (×2): 30 [IU] via SUBCUTANEOUS
  Filled 2018-11-10: qty 10

## 2018-11-10 MED ORDER — POTASSIUM CHLORIDE CRYS ER 20 MEQ PO TBCR
30.0000 meq | EXTENDED_RELEASE_TABLET | Freq: Two times a day (BID) | ORAL | Status: DC
  Administered 2018-11-10: 30 meq via ORAL
  Filled 2018-11-10: qty 1

## 2018-11-10 MED ORDER — LISINOPRIL 10 MG PO TABS: 20.0000 mg | ORAL_TABLET | Freq: Every day | ORAL | Status: DC

## 2018-11-10 MED ORDER — FUROSEMIDE 10 MG/ML IJ SOLN
40.0000 mg | Freq: Two times a day (BID) | INTRAMUSCULAR | Status: DC
  Administered 2018-11-10 – 2018-11-11 (×2): 40 mg via INTRAVENOUS
  Filled 2018-11-10 (×2): qty 4

## 2018-11-10 MED ORDER — INSULIN STARTER KIT- PEN NEEDLES (ENGLISH)
1.0000 | Freq: Once | Status: AC
  Administered 2018-11-10: 1
  Filled 2018-11-10: qty 1

## 2018-11-10 MED ORDER — LISINOPRIL 5 MG PO TABS
5.0000 mg | ORAL_TABLET | Freq: Every day | ORAL | Status: DC
  Administered 2018-11-11: 5 mg via ORAL
  Filled 2018-11-10: qty 1

## 2018-11-10 MED ORDER — POTASSIUM CHLORIDE CRYS ER 10 MEQ PO TBCR
10.0000 meq | EXTENDED_RELEASE_TABLET | Freq: Two times a day (BID) | ORAL | Status: DC
  Administered 2018-11-10 – 2018-11-11 (×2): 10 meq via ORAL
  Filled 2018-11-10 (×2): qty 1

## 2018-11-10 MED ORDER — FUROSEMIDE 10 MG/ML IJ SOLN
80.0000 mg | Freq: Two times a day (BID) | INTRAMUSCULAR | Status: DC
  Administered 2018-11-10: 20 mg via INTRAVENOUS
  Filled 2018-11-10: qty 8

## 2018-11-10 NOTE — Plan of Care (Signed)
Will continue to work with patient on self-administration of subcutaneous insulin and taking his own blood sugars. Patient was agreeable to try self-injecting and did a fine job with his 8am novolog.

## 2018-11-10 NOTE — Progress Notes (Addendum)
Inpatient Diabetes Program Recommendations  AACE/ADA: New Consensus Statement on Inpatient Glycemic Control (2015)  Target Ranges:  Prepandial:   less than 140 mg/dL      Peak postprandial:   less than 180 mg/dL (1-2 hours)      Critically ill patients:  140 - 180 mg/dL   Lab Results  Component Value Date   GLUCAP 242 (H) 11/10/2018   HGBA1C 13.3 (H) 11/07/2018    Review of Glycemic Control Results for Marc Wright, Marc Wright (MRN [id number]) as of 11/10/2018 10:46  Ref. Range 11/09/2018 17:58 11/09/2018 19:30 11/09/2018 23:15 11/10/2018 03:34 11/10/2018 07:42  Glucose-Capillary Latest Ref Range: 70 - 99 mg/dL 257 (H) 317 (H) 211 (H) 177 (H) 242 (H)    Inpatient Diabetes Program Recommendations:   Patient does not have insurance and will likely do better with more affordable insulin regimen for home. Please consider converting patient to 70/30 Insulin at some point during hospitalization to help determine home doses that will work for him. 70/30 Insulin can be purchased for $25 per vial or $40 for insulin pens at [employer] without insurance.  Novolog 70/30 insulin mix 30 units bid ac meals =42 units basal + 18 units meal coverage if decide to transition while in the hospital to determine response of blood glucose.  1:15 Spoke with patient @ bedside. Educated patient on insulin pen use at home. Reviewed contents of insulin flexpen starter kit. Reviewed all steps if insulin pen including attachment of needle, 2-unit air shot, dialing up dose, giving injection, removing needle, disposal of sharps, storage of unused insulin, disposal of insulin etc. Patient able to provide successful return demonstration. Also reviewed troubleshooting with insulin pen. MD to give patient Rxs for insulin pens and insulin pen needles.Reviewed hypoglycemia protocol.  Patient states he works in [employer] normally and works varied hrs. Concerned that he will not be able to take insulin consistently. If he is able to get his  insulin from [facility] may be able to do Lantus or Levemir along with Novolog or Humalog. Will follow up tomorrow.  Thank you, Nani Gasser. Marc Sartin, RN, MSN, CDE  Diabetes Coordinator Inpatient Glycemic Control Team Team Pager [phone] (8am-5pm) 11/10/2018 10:49 AM

## 2018-11-10 NOTE — Progress Notes (Addendum)
PROGRESS NOTE    Marc Wright  ZOX:[id number] DOB: Oct 02, 1975 DOA: 11/06/2018 PCP: Patient, No Pcp Per  Brief Narrative: [age] year old gentleman with history of obstructive sleep apnea not using CPAP, history of asthma, chronic congestive heart failure with known ejection fraction of 25 to 30%, type 2 diabetes on metformin.  Poorly controlled chronic medical problems.  Presented to the emergency room with chest pain, left arm pain shortness of breath and dry cough with nausea.  Occasional smoker.  History of pneumonia.  In the emergency room temperature as high as 104.7.  Lactic acid 2.4.  Blood sugars 396. Rapid COVID-19 test negative in the ER. CT chest showed bilateral multifocal pneumonia, negative for PE. Repeat COVID-19 test negative Patient had some chronic stasis changes on his both legs, now developed cellulitic changes on left leg.  Duplexes were normal.   Assessment & Plan:   Principal Problem:   CAP (community acquired pneumonia) Active Problems:   Chronic systolic heart failure (HCC)   HTN (hypertension)   Obstructive sleep apnea   Diabetes (HCC)   Dehydration   Hyperglycemia   Sepsis (HCC)   Lymphadenopathy   Cellulitis  Multifocal bacterial pneumonia with sepsis: Most likely community-acquired pneumonia.  Treated with Rocephin, azithromycin and vancomycin.   All cultures are negative and clinically improving.   Vancomycin discontinued with negative cultures.   Developed rash all over the body immediately after third day of azithromycin last night.   Now he has developed cellulitis that looks uncomplicated with no evidence of underlying abscess or necrotizing fasciitis on his left leg.   We will treat with higher dose of Rocephin, increase to 2 g daily.   Repeat COVID-19 test negative.  Keep on oxygen to keep saturations more than 90%. Incentive spirometry, albuterol inhaler.  flutter valve therapy Blood pressures are currently adequate.  Adequate peripheral  perfusion.  He has received adequate resuscitation with IV fluids.  Lactic acid normalized.  Check pulse oximetry with ambulation.  He is 96% on 4 L of oxygen.  He did not have oxygen prior to admission to the hospital.  Obstructive sleep apnea: Untreated.  Fairly stable now.  Patient does not use CPAP at home on a regular basis.  Continue CPAP during hospital stay.  Uncontrolled type 2 diabetes: Undertreated with metformin.  A1c is 13. Uptitrate long-acting insulin and prandial insulin today.  We will try to achieve optimum control in the hospital.   will simplify regimen after knowing total need of insulin next 24 to 48 hours. Patient has no medical or insurance coverage, after knowing total insulin need, will change to 70/30 insulin. We discussed about hemoglobin A1c and patient was educated. Nursing to teach insulin administration. Consult diabetic coordinator.  Hypertension:  Will resume both lisinopril and Lasix today.   Blood pressure still elevated at 161/106.  Will increase the dose of Lasix and lisinopril.  Chronic systolic congestive heart failure: Patient still complaining of shortness of breath and increasing lower extremity edema compared to his baseline.  He still has 3+ lower extremity edema.  He takes Lasix 80 mg twice a day at home currently he is getting only 40 mg twice a day.  Will increase to 80 mg IV twice a day and monitor the response.  Continue lisinopril.   Lymphadenopathy: Patient has lymphadenopathy in his chest.  Probably due to infection. Suspected from acute infection.  Patient will need surveillance imaging after discharge.  Left leg cellulitis: Probably acute infection of chronic lymphedema.  Rocephin dose  was increased to 2 g 11/09/2018.  Patient still has angry erythematous left lower extremity needing IV Rocephin.   This patient still requires inpatient stay due to increasing lower extremity erythema edema on IV antibiotics and hypoxia on 4 L of  oxygen with history of CHF and bilateral multilobar pneumonia and admitted with sepsis.  Ambulate the patient check pulse ox on room air if he remains stable overnight we will plan for discharge tomorrow.  Titrate oxygen to keep sats above 92%.  His blood sugars are also not controlled started on insulin during this hospital stay the dose of insulin is being adjusted.  Estimated body mass index is 50.43 kg/m as calculated from the following:   Height as of this encounter: 6\' 3"  (1.905 m).   Weight as of this encounter: 183 kg.  DVT prophylaxis: Lovenox Code Status: Full code Family Communication: None.  Patient will talk with family. Disposition Plan: Inpatient .    Consultants:   None.  Procedures:   None.  Antimicrobials:   Rocephin, azithromycin, vancomycin 11/06/2018--- 11/09/2018  Rocephin 2 g daily, 11/09/2018---   Subjective: Patient desaturating on room air while asleep when I was in the patient's room this morning.  He took the CPAP off because he had a headache and did not place the oxygen back on and his sats dropped dropped to the 70s.  He still feels congested bloated coughing and short of breath.  He reports his lower extremity swelling much worse than usual.  He normally takes Lasix at home.  He denies having history of recurrent cellulitis.  He lives at home alone.  Not gotten out of bed due to shortness of breath and cough.  Objective: Vitals:   11/10/18 0446 11/10/18 0600 11/10/18 0751 11/10/18 0800  BP: (!) 143/76 (!) 161/106  (!) 143/102  Pulse: 91 99  95  Resp: 18 18  20   Temp:   98.1 F (36.7 C)   TempSrc:   Oral   SpO2: 95% 96%  97%  Weight:      Height:        Intake/Output Summary (Last 24 hours) at 11/10/2018 0941 Last data filed at 11/10/2018 0751 Gross per 24 hour  Intake 580 ml  Output 3350 ml  Net -2770 ml   Filed Weights   11/06/18 1947 11/08/18 0400  Weight: (!) 158.8 kg (!) 183 kg    Examination:  General exam: Appears calm  and comfortable  Respiratory system: Coarse breath sounds laterally to auscultation. Respiratory effort normal. Cardiovascular system: S1 & S2 heard, RRR. No JVD, murmurs, rubs, gallops or clicks. No pedal edema. Gastrointestinal system: Abdomen is nondistended, soft and nontender. No organomegaly or masses felt. Normal bowel sounds heard. Central nervous system: Alert and oriented. No focal neurological deficits. Extremities: Left more than right 3+ edema with erythema to the left lower extremity warm to touch Psychiatry: Judgement and insight appear normal. Mood & affect appropriate.     Data Reviewed: I have personally reviewed following labs and imaging studies  CBC: Recent Labs  Lab 11/06/18 1948 11/07/18 0003 11/07/18 0239 11/08/18 0531 11/09/18 0328  WBC 12.5* 15.2* 16.5* 13.4* 10.1  NEUTROABS  --  13.3*  --  11.5* 8.0*  HGB 17.6* 14.8 15.1 14.7 13.7  HCT 51.8 44.4 46.9 46.2 42.6  MCV 85.6 86.7 87.5 89.5 90.3  PLT 176 141* 173 137* 125*   Basic Metabolic Panel: Recent Labs  Lab 11/06/18 1948 11/07/18 0003 11/07/18 0239 11/08/18 0531 11/09/18 1157  NA 130*  --  133* 133* 134*  K 4.0  --  4.1 3.8 3.9  CL 97*  --  98 98 99  CO2 23  --  GLUCOSE 396*  --  341* 235* 227*  BUN 12  --  CREATININE 0.70  --  0.86 0.72 0.62  CALCIUM 8.9  --  8.3* 8.2* 8.3*  MG  --  1.4* 1.5* 1.9  --   PHOS  --  1.9* 3.1  --   --    GFR: Estimated Creatinine Clearance: 204.3 mL/min (by C-G formula based on SCr of 0.62 mg/dL). Liver Function Tests: Recent Labs  Lab 11/07/18 0003 11/07/18 0239  AST 19 15  ALT 15 18  ALKPHOS 81 89  BILITOT 0.8 0.8  PROT 5.7* 6.3*  ALBUMIN 3.0* 3.5   Recent Labs  Lab 11/06/18 2008  LIPASE 43   No results for input(s): AMMONIA in the last 168 hours. Coagulation Profile: Recent Labs  Lab 11/06/18 1948  INR 0.9   Cardiac Enzymes: Recent Labs  Lab 11/06/18 1948 11/07/18 0003 11/07/18 0008 11/07/18 0534 11/07/18 1240   CKTOTAL  --  32*  --   --   --   TROPONINI <0.03  --  <0.03 <0.03 <0.03   BNP (last 3 results) No results for input(s): PROBNP in the last 8760 hours. HbA1C: No results for input(s): HGBA1C in the last 72 hours. CBG: Recent Labs  Lab 11/09/18 1758 11/09/18 1930 11/09/18 2315 11/10/18 0334 11/10/18 0742  GLUCAP 257* 317* 211* 177* 242*   Lipid Profile: No results for input(s): CHOL, HDL, LDLCALC, TRIG, CHOLHDL, LDLDIRECT in the last 72 hours. Thyroid Function Tests: No results for input(s): TSH, T4TOTAL, FREET4, T3FREE, THYROIDAB in the last 72 hours. Anemia Panel: No results for input(s): VITAMINB12, FOLATE, FERRITIN, TIBC, IRON, RETICCTPCT in the last 72 hours. Sepsis Labs: Recent Labs  Lab 11/06/18 2010 11/07/18 0008 11/07/18 0239 11/07/18 0534 11/08/18 0531  PROCALCITON  --  2.98 3.99  --   --   LATICACIDVEN 2.4*  --  2.9* 2.2* 1.0    Recent Results (from the past 240 hour(s))  SARS Coronavirus 2 (CEPHEID- Performed in [facility] hospital lab), Hosp Order     Status: None   Collection Time: 11/06/18  7:56 PM  Result Value Ref Range Status   SARS Coronavirus 2 NEGATIVE NEGATIVE Final    Comment: (NOTE) If result is NEGATIVE SARS-CoV-2 target nucleic acids are NOT DETECTED. The SARS-CoV-2 RNA is generally detectable in upper and lower  respiratory specimens during the acute phase of infection. The lowest  concentration of SARS-CoV-2 viral copies this assay can detect is 250  copies / mL. A negative result does not preclude SARS-CoV-2 infection  and should not be used as the sole basis for treatment or other  patient management decisions.  A negative result may occur with  improper specimen collection / handling, submission of specimen other  than nasopharyngeal swab, presence of viral mutation(s) within the  areas targeted by this assay, and inadequate number of viral copies  (<250 copies / mL). A negative result must be combined with clinical  observations,  patient history, and epidemiological information. If result is POSITIVE SARS-CoV-2 target nucleic acids are DETECTED. The SARS-CoV-2 RNA is generally detectable in upper and lower  respiratory specimens dur ing the acute phase of infection.  Positive  results are indicative of active infection with SARS-CoV-2.  Clinical  correlation with  patient history and other diagnostic information is  necessary to determine patient infection status.  Positive results do  not rule out bacterial infection or co-infection with other viruses. If result is PRESUMPTIVE POSTIVE SARS-CoV-2 nucleic acids MAY BE PRESENT.   A presumptive positive result was obtained on the submitted specimen  and confirmed on repeat testing.  While 2019 novel coronavirus  (SARS-CoV-2) nucleic acids may be present in the submitted sample  additional confirmatory testing may be necessary for epidemiological  and / or clinical management purposes  to differentiate between  SARS-CoV-2 and other Sarbecovirus currently known to infect humans.  If clinically indicated additional testing with an alternate test  methodology [phone]) is advised. The SARS-CoV-2 RNA is generally  detectable in upper and lower respiratory sp ecimens during the acute  phase of infection. The expected result is Negative. Fact Sheet for Patients:  BoilerBrush.com.cy Fact Sheet for Healthcare Providers: https://pope.com/ This test is not yet approved or cleared by the [place] FDA and has been authorized for detection and/or diagnosis of SARS-CoV-2 by FDA under an Emergency Use Authorization (EUA).  This EUA will remain in effect (meaning this test can be used) for the duration of the COVID-19 declaration under Section 564(b)(1) of the Act, 21 U.S.C. section 360bbb-3(b)(1), unless the authorization is terminated or revoked sooner. Performed at [facility], [address]  [place], [place] [address]   Respiratory Panel by PCR     Status: None   Collection Time: 11/06/18  7:56 PM  Result Value Ref Range Status   Adenovirus NOT DETECTED NOT DETECTED Final   Coronavirus 229E NOT DETECTED NOT DETECTED Final    Comment: (NOTE) The Coronavirus on the Respiratory Panel, DOES NOT test for the novel  Coronavirus (2019 nCoV)    Coronavirus HKU1 NOT DETECTED NOT DETECTED Final   Coronavirus NL63 NOT DETECTED NOT DETECTED Final   Coronavirus OC43 NOT DETECTED NOT DETECTED Final   Metapneumovirus NOT DETECTED NOT DETECTED Final   Rhinovirus / Enterovirus NOT DETECTED NOT DETECTED Final   Influenza A NOT DETECTED NOT DETECTED Final   Influenza B NOT DETECTED NOT DETECTED Final   Parainfluenza Virus 1 NOT DETECTED NOT DETECTED Final   Parainfluenza Virus 2 NOT DETECTED NOT DETECTED Final   Parainfluenza Virus 3 NOT DETECTED NOT DETECTED Final   Parainfluenza Virus 4 NOT DETECTED NOT DETECTED Final   Respiratory Syncytial Virus NOT DETECTED NOT DETECTED Final   Bordetella pertussis NOT DETECTED NOT DETECTED Final   Chlamydophila pneumoniae NOT DETECTED NOT DETECTED Final   Mycoplasma pneumoniae NOT DETECTED NOT DETECTED Final    Comment: Performed at [facility], [address], [place], [place] [address]  Culture, blood (Routine x 2)     Status: None (Preliminary result)   Collection Time: 11/06/18  9:10 PM  Result Value Ref Range Status   Specimen Description   Final    BLOOD RIGHT HAND Performed at [facility], [address] [place], [place] [address]    Special Requests   Final    BOTTLES DRAWN AEROBIC AND ANAEROBIC Blood Culture adequate volume Performed at [facility], [address] [place], [place] [address]    Culture   Final    NO GROWTH 2 DAYS Performed at [facility], [address], [place], [place] [address]    Report Status PENDING  Incomplete  Culture, blood (Routine x 2)     Status: None  (Preliminary result)  Collection Time: 11/07/18 12:03 AM  Result Value Ref Range Status   Specimen Description   Final    BLOOD RIGHT HAND Performed at [facility], [address], [place], [place] [address]    Special Requests   Final    BOTTLES DRAWN AEROBIC ONLY Blood Culture results may not be optimal due to an inadequate volume of blood received in culture bottles Performed at [facility], [address] [place], [place] [address]    Culture   Final    NO GROWTH 2 DAYS Performed at [facility], [address] [place], [place] [address]    Report Status PENDING  Incomplete  MRSA PCR Screening     Status: None   Collection Time: 11/07/18 12:15 AM  Result Value Ref Range Status   MRSA by PCR NEGATIVE NEGATIVE Final    Comment:        The GeneXpert MRSA Assay (FDA approved for NASAL specimens only), is one component of a comprehensive MRSA colonization surveillance program. It is not intended to diagnose MRSA infection nor to guide or monitor treatment for MRSA infections. Performed at [facility], [address], [place], [place] [address]   SARS Coronavirus 2 (CEPHEID- Performed in [facility] hospital lab), Hosp Order     Status: None   Collection Time: 11/07/18  9:10 AM  Result Value Ref Range Status   SARS Coronavirus 2 NEGATIVE NEGATIVE Final    Comment: (NOTE) If result is NEGATIVE SARS-CoV-2 target nucleic acids are NOT DETECTED. The SARS-CoV-2 RNA is generally detectable in upper and lower  respiratory specimens during the acute phase of infection. The lowest  concentration of SARS-CoV-2 viral copies this assay can detect is 250  copies / mL. A negative result does not preclude SARS-CoV-2 infection  and should not be used as the sole basis for treatment or other  patient management decisions.  A negative result may occur with  improper specimen collection / handling, submission of specimen  other  than nasopharyngeal swab, presence of viral mutation(s) within the  areas targeted by this assay, and inadequate number of viral copies  (<250 copies / mL). A negative result must be combined with clinical  observations, patient history, and epidemiological information. If result is POSITIVE SARS-CoV-2 target nucleic acids are DETECTED. The SARS-CoV-2 RNA is generally detectable in upper and lower  respiratory specimens dur ing the acute phase of infection.  Positive  results are indicative of active infection with SARS-CoV-2.  Clinical  correlation with patient history and other diagnostic information is  necessary to determine patient infection status.  Positive results do  not rule out bacterial infection or co-infection with other viruses. If result is PRESUMPTIVE POSTIVE SARS-CoV-2 nucleic acids MAY BE PRESENT.   A presumptive positive result was obtained on the submitted specimen  and confirmed on repeat testing.  While 2019 novel coronavirus  (SARS-CoV-2) nucleic acids may be present in the submitted sample  additional confirmatory testing may be necessary for epidemiological  and / or clinical management purposes  to differentiate between  SARS-CoV-2 and other Sarbecovirus currently known to infect humans.  If clinically indicated additional testing with an alternate test  methodology [phone]) is advised. The SARS-CoV-2 RNA is generally  detectable in upper and lower respiratory sp ecimens during the acute  phase of infection. The expected result is Negative. Fact Sheet for Patients:  BoilerBrush.com.cy Fact Sheet for Healthcare Providers: https://pope.com/ This test is not yet approved  or cleared by the [place] and has been authorized for detection and/or diagnosis of SARS-CoV-2 by FDA under an Emergency Use Authorization (EUA).  This EUA will remain in effect (meaning this test can be used) for the duration  of the COVID-19 declaration under Section 564(b)(1) of the Act, 21 U.S.C. section 360bbb-3(b)(1), unless the authorization is terminated or revoked sooner. Performed at [facility], [address] [place], [place] [address]   Culture, sputum-assessment     Status: None   Collection Time: 11/07/18 12:55 PM  Result Value Ref Range Status   Specimen Description SPUTUM  Final   Special Requests Normal  Final   Sputum evaluation   Final    THIS SPECIMEN IS ACCEPTABLE FOR SPUTUM CULTURE Performed at [facility], [address], [place], [place] [address]    Report Status 11/07/2018 FINAL  Final  Culture, respiratory     Status: None   Collection Time: 11/07/18 12:55 PM  Result Value Ref Range Status   Specimen Description   Final    SPUTUM Performed at [facility], [address], [place], [place] [address]    Special Requests   Final    Normal Reflexed from [id number] Performed at [facility], [address], [place], [place] [address]    Gram Stain   Final    RARE WBC PRESENT, PREDOMINANTLY PMN FEW YEAST RARE GRAM POSITIVE RODS RARE GRAM NEGATIVE RODS RARE GRAM POSITIVE COCCI IN PAIRS    Culture   Final    FEW Consistent with normal respiratory flora. Performed at [facility], [address] [place], [place] [address]    Report Status 11/09/2018 FINAL  Final  Novel Coronavirus,NAA,(SEND-OUT TO REF LAB - TAT 24-48 hrs); Hosp Order     Status: None   Collection Time: 11/07/18  7:38 PM  Result Value Ref Range Status   SARS-CoV-2, NAA NOT DETECTED NOT DETECTED Final    Comment: (NOTE) Testing was performed using the cobas(R) SARS-CoV-2 test. This test was developed and its performance characteristics determined by [employer]. This test has not been FDA cleared or approved. This test has been authorized by FDA under an Emergency Use Authorization (EUA). This test is only authorized  for the duration of time the declaration that circumstances exist justifying the authorization of the emergency use of in vitro diagnostic tests for detection of SARS-CoV-2 virus and/or diagnosis of COVID-19 infection under section 564(b)(1) of the Act, 21 U.S.C. 784ONG-2(X)(5), unless the authorization is terminated or revoked sooner. When diagnostic testing is negative, the possibility of a false negative result should be considered in the context of a patient's recent exposures and the presence of clinical signs and symptoms consistent with COVID-19. An individual without symptoms of COVID-19 and who is not shedding SARS-CoV-2 virus would expect to have  a negative (not detected) result in this assay. Performed At: [facility] [address] [place], [place] [id number] Jolene Schimke MD NU:[id number]    Coronavirus Source NASOPHARYNGEAL  Final    Comment: Performed at [facility], [address], [place], [place] [address]         Radiology Studies: Vas [place] Lower Extremity Venous (dvt)  Result Date: 11/09/2018  Lower Venous Study Indications: Pain.  Limitations: Body habitus and poor ultrasound/tissue interface. Performing Technologist: Blanch Media RVS  Examination Guidelines: A complete evaluation includes B-mode imaging, spectral Doppler, color Doppler, and power Doppler as needed of all accessible  portions of each vessel. Bilateral testing is considered an integral part of a complete examination. Limited examinations for reoccurring indications may be performed as noted.  +---------+---------------+---------+-----------+----------+--------------+ RIGHT    CompressibilityPhasicitySpontaneityPropertiesSummary        +---------+---------------+---------+-----------+----------+--------------+ CFV      Full           Yes      Yes                                 +---------+---------------+---------+-----------+----------+--------------+ SFJ       Full                                                        +---------+---------------+---------+-----------+----------+--------------+ FV Prox  Full                                                        +---------+---------------+---------+-----------+----------+--------------+ FV Mid   Full                                                        +---------+---------------+---------+-----------+----------+--------------+ FV DistalFull                                                        +---------+---------------+---------+-----------+----------+--------------+ PFV      Full                                                        +---------+---------------+---------+-----------+----------+--------------+ POP      Full           Yes      Yes                                 +---------+---------------+---------+-----------+----------+--------------+ PTV      Full                                                        +---------+---------------+---------+-----------+----------+--------------+ PERO                                                  Not visualized +---------+---------------+---------+-----------+----------+--------------+   +---------+---------------+---------+-----------+----------+--------------+ LEFT     CompressibilityPhasicitySpontaneityPropertiesSummary        +---------+---------------+---------+-----------+----------+--------------+ CFV      Full  Yes      Yes                                 +---------+---------------+---------+-----------+----------+--------------+ SFJ      Full                                                        +---------+---------------+---------+-----------+----------+--------------+ FV Prox  Full                                                        +---------+---------------+---------+-----------+----------+--------------+ FV Mid   Full                                                         +---------+---------------+---------+-----------+----------+--------------+ FV DistalFull                                                        +---------+---------------+---------+-----------+----------+--------------+ PFV      Full                                                        +---------+---------------+---------+-----------+----------+--------------+ POP      Full           Yes      Yes                                 +---------+---------------+---------+-----------+----------+--------------+ PTV                                                   Not visualized +---------+---------------+---------+-----------+----------+--------------+ PERO                                                  Not visualized +---------+---------------+---------+-----------+----------+--------------+     Summary: Right: There is no evidence of deep vein thrombosis in the lower extremity. However, portions of this examination were limited- see technologist comments above. No cystic structure found in the popliteal fossa. Left: There is no evidence of deep vein thrombosis in the lower extremity. However, portions of this examination were limited- see technologist comments above. No cystic structure found in the popliteal fossa.  *See table(s) above for measurements and observations. Electronically signed by Coral Else MD on 11/09/2018 at 2:51:36 PM.  Final         Scheduled Meds: . Chlorhexidine Gluconate Cloth  6 each Topical Daily  . enoxaparin (LOVENOX) injection  80 mg Subcutaneous Daily  . furosemide  40 mg Oral BID  . insulin aspart  0-9 Units Subcutaneous TID AC & HS  . insulin aspart  12 Units Subcutaneous TID WC  . insulin glargine  20 Units Subcutaneous BID  . lisinopril  20 mg Oral Daily  . loratadine  10 mg Oral Daily  . magnesium oxide  800 mg Oral BID  . mouth rinse  15 mL Mouth Rinse BID  . niacin  250 mg Oral QHS  . nicotine  14 mg  Transdermal Daily   Continuous Infusions: . cefTRIAXone (ROCEPHIN)  IV Stopped (11/09/18 1420)     LOS: 3 days     Alwyn Ren, MD [facility]  If 7PM-7AM, please contact night-coverage [email or web address] Password [facility] 11/10/2018, 9:41 AM

## 2018-11-11 DIAGNOSIS — L03115 Cellulitis of right lower limb: Secondary | ICD-10-CM

## 2018-11-11 LAB — BASIC METABOLIC PANEL
Anion gap: 8 (ref 5–15)
BUN: 9 mg/dL (ref 6–20)
CO2: 33 mmol/L — ABNORMAL HIGH (ref 22–32)
Calcium: 8.7 mg/dL — ABNORMAL LOW (ref 8.9–10.3)
Chloride: 95 mmol/L — ABNORMAL LOW (ref 98–111)
Creatinine, Ser: 0.63 mg/dL (ref 0.61–1.24)
GFR calc Af Amer: >60 mL/min (ref >60)
GFR calc non Af Amer: >60 mL/min (ref >60)
Glucose, Bld: 176 mg/dL — ABNORMAL HIGH (ref 70–99)
Potassium: 4.2 mmol/L (ref 3.5–5.1)
Sodium: 136 mmol/L (ref 135–145)

## 2018-11-11 LAB — GLUCOSE, CAPILLARY
Glucose-Capillary: 132 mg/dL — ABNORMAL HIGH (ref 70–99)
Glucose-Capillary: 161 mg/dL — ABNORMAL HIGH (ref 70–99)
Glucose-Capillary: 172 mg/dL — ABNORMAL HIGH (ref 70–99)
Glucose-Capillary: 249 mg/dL — ABNORMAL HIGH (ref 70–99)

## 2018-11-11 LAB — CBC
HCT: 43.5 % (ref 39.0–52.0)
Hemoglobin: 14.1 g/dL (ref 13.0–17.0)
MCH: 28.9 pg (ref 26.0–34.0)
MCHC: 32.4 g/dL (ref 30.0–36.0)
MCV: 89.1 fL (ref 80.0–100.0)
Platelets: 191 10*3/uL (ref 150–400)
RBC: 4.88 MIL/uL (ref 4.22–5.81)
RDW: 14.1 % (ref 11.5–15.5)
WBC: 7.7 10*3/uL (ref 4.0–10.5)
nRBC: 0 % (ref 0.0–0.2)

## 2018-11-11 LAB — MAGNESIUM: Magnesium: 2.2 mg/dL (ref 1.7–2.4)

## 2018-11-11 MED ORDER — INSULIN NPH ISOPHANE & REGULAR (70-30) 100 UNIT/ML ~~LOC~~ SUSP: SUBCUTANEOUS | 3 refills | Status: DC

## 2018-11-11 MED ORDER — CEPHALEXIN 500 MG PO CAPS: 500.0000 mg | ORAL_CAPSULE | Freq: Four times a day (QID) | ORAL | 0 refills | Status: DC

## 2018-11-11 MED ORDER — "INSULIN SYRINGE 31G X 5/16"" 0.5 ML MISC": 0 refills | Status: DC

## 2018-11-11 MED ORDER — FEXOFENADINE HCL 180 MG PO TABS: 180.0000 mg | ORAL_TABLET | Freq: Every day | ORAL | Status: DC

## 2018-11-11 MED ORDER — NICOTINE 14 MG/24HR TD PT24: 14.0000 mg | MEDICATED_PATCH | Freq: Every day | TRANSDERMAL | 0 refills | Status: DC

## 2018-11-11 MED ORDER — CEPHALEXIN 500 MG PO CAPS: 500.0000 mg | ORAL_CAPSULE | Freq: Four times a day (QID) | ORAL | 0 refills | Status: AC

## 2018-11-11 MED ORDER — INSULIN PEN NEEDLE 32G X 4 MM MISC: 2 refills | Status: DC

## 2018-11-11 MED ORDER — INSULIN ASPART PROT & ASPART (70-30 MIX) 100 UNIT/ML PEN: 40.0000 [IU] | PEN_INJECTOR | Freq: Two times a day (BID) | SUBCUTANEOUS | 11 refills | Status: DC

## 2018-11-11 MED FILL — !NOVOLIN 70/30 100 UNITS/ML: (70-30) 100 | 12 days supply | Qty: 10 | Fill #0

## 2018-11-11 MED FILL — CEPHALEXIN 500 MG CAPSULE: 500 | 7 days supply | Qty: 28 | Fill #0

## 2018-11-11 NOTE — Discharge Summary (Addendum)
Physician Discharge Summary  Marc Wright ZOX:[id number] DOB: [birth year] DOA: 11/06/2018  PCP: Patient, No Pcp Per  Admit date: 11/06/2018 Discharge date: 11/11/2018  Admitted From: Home Disposition: Home  Recommendations for Outpatient Follow-up:  1. Follow up with PCP in 1-2 weeks 2. Please obtain BMP/CBC in one week   Home Health: None Equipment/Devices: None Discharge Condition: Stable and improved CODE STATUS: Full code Diet recommendation: Cardiac carb modified  Brief/Interim Summary:43 year old gentleman with history of obstructive sleep apnea not using CPAP, history of asthma, chronic congestive heart failure with known ejection fraction of 25 to 30%, type 2 diabetes on metformin. Poorly controlled chronic medical problems. Presented to the emergency room with chest pain, left arm pain shortness of breath and dry cough with nausea. Occasional smoker. History of pneumonia. In the emergency room temperature as high as 104.7. Lactic acid 2.4. Blood sugars 396. Rapid COVID-19 test negative in the ER. CT chest showed bilateral multifocal pneumonia, negative for PE. Repeat COVID-19 test negative Patient had some chronic stasis changes on his both legs, now developed cellulitic changes on left leg. Duplexes were normal.    Discharge Diagnoses:  Principal Problem:   CAP (community acquired pneumonia) Active Problems:   Chronic systolic heart failure (HCC)   HTN (hypertension)   Obstructive sleep apnea   Diabetes (HCC)   Dehydration   Hyperglycemia   Sepsis (HCC)   Lymphadenopathy   Cellulitis   Multifocal bacterial pneumonia with sepsis: Most likely community-acquired pneumonia.Treated with Rocephin, azithromycin and vancomycin.  All cultures are negative and clinically improving.  Vancomycin discontinued with negative cultures.  Developed rash all over the body immediately after third day of azithromycin last night.  Now he has developed cellulitis that  looks uncomplicated with no evidence of underlying abscess or necrotizing fasciitis on his left leg. He was treated with Rocephin 2 g daily. Repeat COVID-19 test negative. He was 90% on room air after ambulating.  Obstructive sleep apnea: Untreated. Fairly stable now.  Patient does not use CPAP at home on a regular basis.     Uncontrolled type 2 diabetes: Undertreated with metformin. A1c is 13.  He was treated with insulin during the hospital stay and he is discharged on 40 units twice a day of NovoLog 70/30 FlexPen.  [facility] to follow-up on CBC BMP and blood sugar management.  Hypertension: Continue lisinopril and Lasix  Chronic systolic congestive heart failure: Lasix 80 mg twice a day.   Lymphadenopathy:Patient has lymphadenopathy in his chest. Probably due to infection. Suspected from acute infection. Patient will need surveillance imaging after discharge.  Left leg cellulitis: Probably acute infection of chronic lymphedema.   He was treated with Rocephin he will be discharged on Keflex for 7 days.  Patient reports decreased edema and decreased tenderness and pain to the left lower extremity.  The extremity is still swollen advised to continue Lasix and keep his feet elevated at home.  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   Estimated body mass index is 50.43 kg/m as calculated from the following:   Height as of this encounter:  (1.905 m).   Weight as of this encounter: 183 kg.  Discharge Instructions  Discharge Instructions    Call MD for:  difficulty breathing, headache or visual disturbances   Complete by:  As directed    Call MD for:  temperature >100.4   Complete by:  As directed    Diet - low sodium heart healthy   Complete by:  As directed    Increase activity slowly   Complete by:  As directed      Allergies as of 11/11/2018      Reactions    Azithromycin Itching, Rash   Burning rash with intravenous dose      Medication List    STOP taking these medications   metFORMIN 500 MG tablet Commonly known as:  GLUCOPHAGE   naproxen sodium 220 MG tablet Commonly known as:  ALEVE     TAKE these medications   B-complex with vitamin C tablet Take 1 tablet by mouth daily.   CALCIUM/MAGNESIUM/ZINC FORMULA PO Take 1 tablet by mouth daily as needed (muscle cramps).   carvedilol 12.5 MG tablet Commonly known as:  COREG Take 1 tablet (12.5 mg total) by mouth 2 (two) times daily with a meal.   cephALEXin 500 MG capsule Commonly known as:  KEFLEX Take 1 capsule (500 mg total) by mouth 4 (four) times daily for 7 days.   fexofenadine 180 MG tablet Commonly known as:  ALLEGRA Take 1 tablet (180 mg total) by mouth daily. Taking as needed   furosemide 80 MG tablet Commonly known as:  LASIX Take 1 tablet (80 mg total) by mouth 2 (two) times daily.   insulin aspart protamine - aspart (70-30) 100 UNIT/ML FlexPen Commonly known as:  NOVOLOG 70/30 MIX Inject 0.4 mLs (40 Units total) into the skin 2 (two) times daily.   Insulin Pen Needle 32G X 4 MM Misc Take 40 units twice a day with meals subcutaneously   lisinopril 5 MG tablet Commonly known as:  ZESTRIL Take 1 tablet (5 mg total) by mouth daily.   multivitamin Tabs tablet Take 1 tablet by mouth daily.   niacin 250 MG tablet Take 250 mg by mouth at bedtime.   nicotine 14 mg/24hr patch Commonly known as:  NICODERM CQ - dosed in mg/24 hours Place 1 patch (14 mg total) onto the skin daily. Start taking on:  Nov 12, 2018      Follow-up Information    [facility]. Call.   Why:  call to make a follow-up appointment. On-site pharmacy available to fill prescription needs.  Please check BMP and CBC in 1 week Contact information: [address] [address] [phone]         Allergies  Allergen Reactions  .  Azithromycin Itching and Rash    Burning rash with intravenous dose    Consultations:  None   Procedures/Studies: Ct Angio Chest Pe W And/or Wo Contrast  Result Date: 11/06/2018 CLINICAL DATA:  Chest pain, dyspnea, fever EXAM: CT ANGIOGRAPHY CHEST WITH CONTRAST TECHNIQUE: Multidetector CT imaging of the chest was performed using the standard protocol during bolus administration of intravenous contrast. Multiplanar CT image reconstructions and MIPs were obtained to evaluate the vascular anatomy. CONTRAST:  OMNIPAQUE IOHEXOL 350 MG/ML SOLN COMPARISON:  Same day chest radiograph, CT chest, 03/06/2016 FINDINGS: Cardiovascular: Satisfactory opacification of the pulmonary arteries to the segmental level. No evidence of pulmonary embolism. Cardiomegaly and coronary artery calcifications. No pericardial effusion. Mediastinum/Nodes: Numerous bulky mediastinal and bilateral hilar lymph nodes, largest right paratracheal nodes measuring at least 2.6 cm in short axis. Thyroid gland, trachea, and esophagus demonstrate no significant findings. Lungs/Pleura: There are dense consolidative opacities of the superior segments of the lower lobes bilaterally (series 10, image 69). Additional scattered opacities bilaterally. No  pleural effusion or pneumothorax. Upper Abdomen: No acute abnormality. Musculoskeletal: No chest wall abnormality. No acute or significant osseous findings. Review of the MIP images confirms the above findings. IMPRESSION: 1.  Negative examination for pulmonary embolism. 2. There are dense consolidative opacities of the superior segments of the lower lobes bilaterally (series 10, image 69). Additional scattered opacities bilaterally. Findings are consistent with multifocal infection. 3. Numerous bulky mediastinal and bilateral hilar lymph nodes, largest right paratracheal nodes measuring at least 2.6 cm in short axis. Although possibly reactive, given size these are concerning for malignancy such  as lymphoma. 4.  Coronary artery disease. Electronically Signed   By: Lauralyn Primes M.D.   On: 11/06/2018 23:52   Dg Chest Port 1 View  Result Date: 11/06/2018 CLINICAL DATA:  Acute onset of chest pain that began yesterday. EXAM: PORTABLE CHEST 1 VIEW COMPARISON:  06/26/2016 and earlier, including CTA chest 06/25/2016. FINDINGS: Suboptimal inspiration accounts for crowded bronchovascular markings, especially in the bases, and accentuates the cardiac silhouette. Taking this into account, cardiac silhouette moderately enlarged, unchanged. Patchy airspace opacities involving the MEDIAL RIGHT lung base. Lungs otherwise clear. Pulmonary vascularity normal without evidence of pulmonary edema. No visible pleural effusions. IMPRESSION: 1. Acute pneumonia involving the MEDIAL RIGHT lung base. 2. Stable cardiomegaly without pulmonary edema. Electronically Signed   By: Hulan Saas M.D.   On: 11/06/2018 20:11   Vas [place] Lower Extremity Venous (dvt)  Result Date: 11/09/2018  Lower Venous Study Indications: Pain.  Limitations: Body habitus and poor ultrasound/tissue interface. Performing Technologist: Blanch Media RVS  Examination Guidelines: A complete evaluation includes B-mode imaging, spectral Doppler, color Doppler, and power Doppler as needed of all accessible portions of each vessel. Bilateral testing is considered an integral part of a complete examination. Limited examinations for reoccurring indications may be performed as noted.  +---------+---------------+---------+-----------+----------+--------------+ RIGHT    CompressibilityPhasicitySpontaneityPropertiesSummary        +---------+---------------+---------+-----------+----------+--------------+ CFV      Full           Yes      Yes                                 +---------+---------------+---------+-----------+----------+--------------+ SFJ      Full                                                         +---------+---------------+---------+-----------+----------+--------------+ FV Prox  Full                                                        +---------+---------------+---------+-----------+----------+--------------+ FV Mid   Full                                                        +---------+---------------+---------+-----------+----------+--------------+ FV DistalFull                                                        +---------+---------------+---------+-----------+----------+--------------+  PFV      Full                                                        +---------+---------------+---------+-----------+----------+--------------+ POP      Full           Yes      Yes                                 +---------+---------------+---------+-----------+----------+--------------+ PTV      Full                                                        +---------+---------------+---------+-----------+----------+--------------+ PERO                                                  Not visualized +---------+---------------+---------+-----------+----------+--------------+   +---------+---------------+---------+-----------+----------+--------------+ LEFT     CompressibilityPhasicitySpontaneityPropertiesSummary        +---------+---------------+---------+-----------+----------+--------------+ CFV      Full           Yes      Yes                                 +---------+---------------+---------+-----------+----------+--------------+ SFJ      Full                                                        +---------+---------------+---------+-----------+----------+--------------+ FV Prox  Full                                                        +---------+---------------+---------+-----------+----------+--------------+ FV Mid   Full                                                         +---------+---------------+---------+-----------+----------+--------------+ FV DistalFull                                                        +---------+---------------+---------+-----------+----------+--------------+ PFV      Full                                                        +---------+---------------+---------+-----------+----------+--------------+  POP      Full           Yes      Yes                                 +---------+---------------+---------+-----------+----------+--------------+ PTV                                                   Not visualized +---------+---------------+---------+-----------+----------+--------------+ PERO                                                  Not visualized +---------+---------------+---------+-----------+----------+--------------+     Summary: Right: There is no evidence of deep vein thrombosis in the lower extremity. However, portions of this examination were limited- see technologist comments above. No cystic structure found in the popliteal fossa. Left: There is no evidence of deep vein thrombosis in the lower extremity. However, portions of this examination were limited- see technologist comments above. No cystic structure found in the popliteal fossa.  *See table(s) above for measurements and observations. Electronically signed by Coral ElseVance Brabham MD on 11/09/2018 at 2:51:36 PM.    Final     (Echo, Carotid, EGD, Colonoscopy, ERCP)    Subjective: Resting in bed took the CPAP off again awake and alert answers all my questions appropriately anxious to go home  Discharge Exam: Vitals:   11/10/18 1954 11/11/18 0536  BP: 133/80 136/90  Pulse: 91 84  Resp: 20 20  Temp: 99.1 F (37.3 C) 98.7 F (37.1 C)  SpO2: 97% 92%   Vitals:   11/10/18 1221 11/10/18 1953 11/10/18 1954 11/11/18 0536  BP: 130/84  133/80 136/90  Pulse: 89 92 91 84  Resp: 20 (!) 21 20 20   Temp: 97.7 F (36.5 C)  99.1 F (37.3 C) 98.7 F  (37.1 C)  TempSrc: Oral  Oral Oral  SpO2: 99% 97% 97% 92%  Weight:      Height:        General: Pt is alert, awake, not in acute distress Cardiovascular: RRR, S1/S2 +, no rubs, no gallops Respiratory: CTA bilaterally, no wheezing, no rhonchi Abdominal: Soft, NT, ND, bowel sounds + Extremities: 2+ edema decreased edema and erythema to left lower extremity  The results of significant diagnostics from this hospitalization (including imaging, microbiology, ancillary and laboratory) are listed below for reference.     Microbiology: Recent Results (from the past 240 hour(s))  SARS Coronavirus 2 (CEPHEID- Performed in [facility] hospital lab), Hosp Order     Status: None   Collection Time: 11/06/18  7:56 PM  Result Value Ref Range Status   SARS Coronavirus 2 NEGATIVE NEGATIVE Final    Comment: (NOTE) If result is NEGATIVE SARS-CoV-2 target nucleic acids are NOT DETECTED. The SARS-CoV-2 RNA is generally detectable in upper and lower  respiratory specimens during the acute phase of infection. The lowest  concentration of SARS-CoV-2 viral copies this assay can detect is 250  copies / mL. A negative result does not preclude SARS-CoV-2 infection  and should not be used as the sole basis for treatment or other  patient management decisions.  A negative result may occur  with  improper specimen collection / handling, submission of specimen other  than nasopharyngeal swab, presence of viral mutation(s) within the  areas targeted by this assay, and inadequate number of viral copies  (<250 copies / mL). A negative result must be combined with clinical  observations, patient history, and epidemiological information. If result is POSITIVE SARS-CoV-2 target nucleic acids are DETECTED. The SARS-CoV-2 RNA is generally detectable in upper and lower  respiratory specimens dur ing the acute phase of infection.  Positive  results are indicative of active infection with SARS-CoV-2.  Clinical   correlation with patient history and other diagnostic information is  necessary to determine patient infection status.  Positive results do  not rule out bacterial infection or co-infection with other viruses. If result is PRESUMPTIVE POSTIVE SARS-CoV-2 nucleic acids MAY BE PRESENT.   A presumptive positive result was obtained on the submitted specimen  and confirmed on repeat testing.  While 2019 novel coronavirus  (SARS-CoV-2) nucleic acids may be present in the submitted sample  additional confirmatory testing may be necessary for epidemiological  and / or clinical management purposes  to differentiate between  SARS-CoV-2 and other Sarbecovirus currently known to infect humans.  If clinically indicated additional testing with an alternate test  methodology [phone]) is advised. The SARS-CoV-2 RNA is generally  detectable in upper and lower respiratory sp ecimens during the acute  phase of infection. The expected result is Negative. Fact Sheet for Patients:  BoilerBrush.com.cy Fact Sheet for Healthcare Providers: https://pope.com/ This test is not yet approved or cleared by the [place] FDA and has been authorized for detection and/or diagnosis of SARS-CoV-2 by FDA under an Emergency Use Authorization (EUA).  This EUA will remain in effect (meaning this test can be used) for the duration of the COVID-19 declaration under Section 564(b)(1) of the Act, 21 U.S.C. section 360bbb-3(b)(1), unless the authorization is terminated or revoked sooner. Performed at [facility], [address], [place], [place] [address]   Respiratory Panel by PCR     Status: None   Collection Time: 11/06/18  7:56 PM  Result Value Ref Range Status   Adenovirus NOT DETECTED NOT DETECTED Final   Coronavirus 229E NOT DETECTED NOT DETECTED Final    Comment: (NOTE) The Coronavirus on the Respiratory Panel, DOES NOT test for the novel   Coronavirus (2019 nCoV)    Coronavirus HKU1 NOT DETECTED NOT DETECTED Final   Coronavirus NL63 NOT DETECTED NOT DETECTED Final   Coronavirus OC43 NOT DETECTED NOT DETECTED Final   Metapneumovirus NOT DETECTED NOT DETECTED Final   Rhinovirus / Enterovirus NOT DETECTED NOT DETECTED Final   Influenza A NOT DETECTED NOT DETECTED Final   Influenza B NOT DETECTED NOT DETECTED Final   Parainfluenza Virus 1 NOT DETECTED NOT DETECTED Final   Parainfluenza Virus 2 NOT DETECTED NOT DETECTED Final   Parainfluenza Virus 3 NOT DETECTED NOT DETECTED Final   Parainfluenza Virus 4 NOT DETECTED NOT DETECTED Final   Respiratory Syncytial Virus NOT DETECTED NOT DETECTED Final   Bordetella pertussis NOT DETECTED NOT DETECTED Final   Chlamydophila pneumoniae NOT DETECTED NOT DETECTED Final   Mycoplasma pneumoniae NOT DETECTED NOT DETECTED Final    Comment: Performed at [facility], [address] [place], [place] [address]  Culture, blood (Routine x 2)     Status: None (Preliminary result)   Collection Time: 11/06/18  9:10 PM  Result Value Ref Range Status   Specimen Description   Final  BLOOD RIGHT HAND Performed at [facility], [address] [place], [place] [address]    Special Requests   Final    BOTTLES DRAWN AEROBIC AND ANAEROBIC Blood Culture adequate volume Performed at [facility], [address], [place], [place] [address]    Culture   Final    NO GROWTH 3 DAYS Performed at [facility], [address], [place], [place] [address]    Report Status PENDING  Incomplete  Culture, blood (Routine x 2)     Status: None (Preliminary result)   Collection Time: 11/07/18 12:03 AM  Result Value Ref Range Status   Specimen Description   Final    BLOOD RIGHT HAND Performed at [facility], [address], [place], [place] [address]    Special Requests   Final    BOTTLES DRAWN AEROBIC ONLY Blood Culture results may not be  optimal due to an inadequate volume of blood received in culture bottles Performed at [facility], [address] [place], [place] [address]    Culture   Final    NO GROWTH 3 DAYS Performed at [facility], [address], [place], [place] [address]    Report Status PENDING  Incomplete  MRSA PCR Screening     Status: None   Collection Time: 11/07/18 12:15 AM  Result Value Ref Range Status   MRSA by PCR NEGATIVE NEGATIVE Final    Comment:        The GeneXpert MRSA Assay (FDA approved for NASAL specimens only), is one component of a comprehensive MRSA colonization surveillance program. It is not intended to diagnose MRSA infection nor to guide or monitor treatment for MRSA infections. Performed at [facility], [address], [place], [place] [address]   SARS Coronavirus 2 (CEPHEID- Performed in [facility] hospital lab), Hosp Order     Status: None   Collection Time: 11/07/18  9:10 AM  Result Value Ref Range Status   SARS Coronavirus 2 NEGATIVE NEGATIVE Final    Comment: (NOTE) If result is NEGATIVE SARS-CoV-2 target nucleic acids are NOT DETECTED. The SARS-CoV-2 RNA is generally detectable in upper and lower  respiratory specimens during the acute phase of infection. The lowest  concentration of SARS-CoV-2 viral copies this assay can detect is 250  copies / mL. A negative result does not preclude SARS-CoV-2 infection  and should not be used as the sole basis for treatment or other  patient management decisions.  A negative result may occur with  improper specimen collection / handling, submission of specimen other  than nasopharyngeal swab, presence of viral mutation(s) within the  areas targeted by this assay, and inadequate number of viral copies  (<250 copies / mL). A negative result must be combined with clinical  observations, patient history, and epidemiological information. If result is POSITIVE SARS-CoV-2 target nucleic  acids are DETECTED. The SARS-CoV-2 RNA is generally detectable in upper and lower  respiratory specimens dur ing the acute phase of infection.  Positive  results are indicative of active infection with SARS-CoV-2.  Clinical  correlation with patient history and other diagnostic information is  necessary to determine patient infection status.  Positive results do  not rule out bacterial infection or co-infection with other viruses. If result is PRESUMPTIVE POSTIVE SARS-CoV-2 nucleic acids MAY BE PRESENT.   A presumptive positive result was obtained on the submitted specimen  and confirmed on repeat testing.  While 2019 novel  coronavirus  (SARS-CoV-2) nucleic acids may be present in the submitted sample  additional confirmatory testing may be necessary for epidemiological  and / or clinical management purposes  to differentiate between  SARS-CoV-2 and other Sarbecovirus currently known to infect humans.  If clinically indicated additional testing with an alternate test  methodology [phone]) is advised. The SARS-CoV-2 RNA is generally  detectable in upper and lower respiratory sp ecimens during the acute  phase of infection. The expected result is Negative. Fact Sheet for Patients:  BoilerBrush.com.cy Fact Sheet for Healthcare Providers: https://pope.com/ This test is not yet approved or cleared by the [place] FDA and has been authorized for detection and/or diagnosis of SARS-CoV-2 by FDA under an Emergency Use Authorization (EUA).  This EUA will remain in effect (meaning this test can be used) for the duration of the COVID-19 declaration under Section 564(b)(1) of the Act, 21 U.S.C. section 360bbb-3(b)(1), unless the authorization is terminated or revoked sooner. Performed at [facility], [address] [place], [place] [address]   Culture, sputum-assessment     Status: None   Collection Time: 11/07/18 12:55  PM  Result Value Ref Range Status   Specimen Description SPUTUM  Final   Special Requests Normal  Final   Sputum evaluation   Final    THIS SPECIMEN IS ACCEPTABLE FOR SPUTUM CULTURE Performed at [facility], [address] [place], [place] [address]    Report Status 11/07/2018 FINAL  Final  Culture, respiratory     Status: None   Collection Time: 11/07/18 12:55 PM  Result Value Ref Range Status   Specimen Description   Final    SPUTUM Performed at [facility], [address] [place], [place] [address]    Special Requests   Final    Normal Reflexed from [id number] Performed at [facility], [address] [place], [place] [address]    Gram Stain   Final    RARE WBC PRESENT, PREDOMINANTLY PMN FEW YEAST RARE GRAM POSITIVE RODS RARE GRAM NEGATIVE RODS RARE GRAM POSITIVE COCCI IN PAIRS    Culture   Final    FEW Consistent with normal respiratory flora. Performed at [facility], [address], [place], [place] [address]    Report Status 11/09/2018 FINAL  Final  Novel Coronavirus,NAA,(SEND-OUT TO REF LAB - TAT 24-48 hrs); Hosp Order     Status: None   Collection Time: 11/07/18  7:38 PM  Result Value Ref Range Status   SARS-CoV-2, NAA NOT DETECTED NOT DETECTED Final    Comment: (NOTE) Testing was performed using the cobas(R) SARS-CoV-2 test. This test was developed and its performance characteristics determined by [employer]. This test has not been FDA cleared or approved. This test has been authorized by FDA under an Emergency Use Authorization (EUA). This test is only authorized for the duration of time the declaration that circumstances exist justifying the authorization of the emergency use of in vitro diagnostic tests for detection of SARS-CoV-2 virus and/or diagnosis of COVID-19 infection under section 564(b)(1) of the Act, 21 U.S.C. 952WUX-3(K)(4), unless the authorization is terminated or  revoked sooner. When diagnostic testing is negative, the possibility of a false negative result should be considered in the context of a patient's recent exposures and the presence of clinical signs and symptoms consistent with COVID-19. An individual without symptoms of COVID-19 and who is not shedding SARS-CoV-2 virus would expect to have  a negative (not detected) result in this  assay. Performed At: [facility] [address] [place], [place] [id number] Jolene Schimke MD WU:[id number]    Coronavirus Source NASOPHARYNGEAL  Final    Comment: Performed at [facility], [address], [place], [place] [address]     Labs: BNP (last 3 results) Recent Labs    11/06/18 1948  BNP 46.9   Basic Metabolic Panel: Recent Labs  Lab 11/06/18 1948 11/07/18 0003 11/07/18 0239 11/08/18 0531 11/09/18 0328 11/11/18 0556  NA 130*  --  133* 133* 134* 136  K 4.0  --  4.1 3.8 3.9 4.2  CL 97*  --  98 98 99 95*  CO2 23  --  27 25 26  33*  GLUCOSE 396*  --  341* 235* 227* 176*  BUN 12  --  12 7 8 9   CREATININE 0.70  --  0.86 0.72 0.62 0.63  CALCIUM 8.9  --  8.3* 8.2* 8.3* 8.7*  MG  --  1.4* 1.5* 1.9  --  2.2  PHOS  --  1.9* 3.1  --   --   --    Liver Function Tests: Recent Labs  Lab 11/07/18 0003 11/07/18 0239  AST 19 15  ALT 15 18  ALKPHOS 81 89  BILITOT 0.8 0.8  PROT 5.7* 6.3*  ALBUMIN 3.0* 3.5   Recent Labs  Lab 11/06/18 2008  LIPASE 43   No results for input(s): AMMONIA in the last 168 hours. CBC: Recent Labs  Lab 11/07/18 0003 11/07/18 0239 11/08/18 0531 11/09/18 0328 11/11/18 0556  WBC 15.2* 16.5* 13.4* 10.1 7.7  NEUTROABS 13.3*  --  11.5* 8.0*  --   HGB 14.8 15.1 14.7 13.7 14.1  HCT 44.4 46.9 46.2 42.6 43.5  MCV 86.7 87.5 89.5 90.3 89.1  PLT 141* 173 137* 125* 191   Cardiac Enzymes: Recent Labs  Lab 11/06/18 1948 11/07/18 0003 11/07/18 0008 11/07/18 0534 11/07/18 1240  CKTOTAL  --  32*  --   --   --   TROPONINI <0.03  --   <0.03 <0.03 <0.03   BNP: Invalid input(s): POCBNP CBG: Recent Labs  Lab 11/10/18 1609 11/10/18 1948 11/11/18 0019 11/11/18 0420 11/11/18 0729  GLUCAP 204* 189* 132* 161* 172*   D-Dimer No results for input(s): DDIMER in the last 72 hours. Hgb A1c No results for input(s): HGBA1C in the last 72 hours. Lipid Profile No results for input(s): CHOL, HDL, LDLCALC, TRIG, CHOLHDL, LDLDIRECT in the last 72 hours. Thyroid function studies No results for input(s): TSH, T4TOTAL, T3FREE, THYROIDAB in the last 72 hours.  Invalid input(s): FREET3 Anemia work up No results for input(s): VITAMINB12, FOLATE, FERRITIN, TIBC, IRON, RETICCTPCT in the last 72 hours. Urinalysis    Component Value Date/Time   COLORURINE YELLOW 11/06/2018 1947   APPEARANCEUR CLEAR 11/06/2018 1947   LABSPEC 1.026 11/06/2018 1947   PHURINE 5.0 11/06/2018 1947   GLUCOSEU >=500 (A) 11/06/2018 1947   HGBUR NEGATIVE 11/06/2018 1947   BILIRUBINUR NEGATIVE 11/06/2018 1947   KETONESUR 5 (A) 11/06/2018 1947   PROTEINUR 30 (A) 11/06/2018 1947   NITRITE NEGATIVE 11/06/2018 1947   LEUKOCYTESUR NEGATIVE 11/06/2018 1947   Sepsis Labs Invalid input(s): PROCALCITONIN,  WBC,  LACTICIDVEN Microbiology Recent Results (from the past 240 hour(s))  SARS Coronavirus 2 (CEPHEID- Performed in [facility] hospital lab), Hosp Order     Status: None   Collection Time: 11/06/18  7:56 PM  Result Value Ref Range Status   SARS Coronavirus 2 NEGATIVE NEGATIVE Final  Comment: (NOTE) If result is NEGATIVE SARS-CoV-2 target nucleic acids are NOT DETECTED. The SARS-CoV-2 RNA is generally detectable in upper and lower  respiratory specimens during the acute phase of infection. The lowest  concentration of SARS-CoV-2 viral copies this assay can detect is 250  copies / mL. A negative result does not preclude SARS-CoV-2 infection  and should not be used as the sole basis for treatment or other  patient management decisions.  A negative  result may occur with  improper specimen collection / handling, submission of specimen other  than nasopharyngeal swab, presence of viral mutation(s) within the  areas targeted by this assay, and inadequate number of viral copies  (<250 copies / mL). A negative result must be combined with clinical  observations, patient history, and epidemiological information. If result is POSITIVE SARS-CoV-2 target nucleic acids are DETECTED. The SARS-CoV-2 RNA is generally detectable in upper and lower  respiratory specimens dur ing the acute phase of infection.  Positive  results are indicative of active infection with SARS-CoV-2.  Clinical  correlation with patient history and other diagnostic information is  necessary to determine patient infection status.  Positive results do  not rule out bacterial infection or co-infection with other viruses. If result is PRESUMPTIVE POSTIVE SARS-CoV-2 nucleic acids MAY BE PRESENT.   A presumptive positive result was obtained on the submitted specimen  and confirmed on repeat testing.  While 2019 novel coronavirus  (SARS-CoV-2) nucleic acids may be present in the submitted sample  additional confirmatory testing may be necessary for epidemiological  and / or clinical management purposes  to differentiate between  SARS-CoV-2 and other Sarbecovirus currently known to infect humans.  If clinically indicated additional testing with an alternate test  methodology [phone]) is advised. The SARS-CoV-2 RNA is generally  detectable in upper and lower respiratory sp ecimens during the acute  phase of infection. The expected result is Negative. Fact Sheet for Patients:  BoilerBrush.com.cy Fact Sheet for Healthcare Providers: https://pope.com/ This test is not yet approved or cleared by the [place] FDA and has been authorized for detection and/or diagnosis of SARS-CoV-2 by FDA under an Emergency Use Authorization  (EUA).  This EUA will remain in effect (meaning this test can be used) for the duration of the COVID-19 declaration under Section 564(b)(1) of the Act, 21 U.S.C. section 360bbb-3(b)(1), unless the authorization is terminated or revoked sooner. Performed at [facility], [address], [place], [place] [address]   Respiratory Panel by PCR     Status: None   Collection Time: 11/06/18  7:56 PM  Result Value Ref Range Status   Adenovirus NOT DETECTED NOT DETECTED Final   Coronavirus 229E NOT DETECTED NOT DETECTED Final    Comment: (NOTE) The Coronavirus on the Respiratory Panel, DOES NOT test for the novel  Coronavirus (2019 nCoV)    Coronavirus HKU1 NOT DETECTED NOT DETECTED Final   Coronavirus NL63 NOT DETECTED NOT DETECTED Final   Coronavirus OC43 NOT DETECTED NOT DETECTED Final   Metapneumovirus NOT DETECTED NOT DETECTED Final   Rhinovirus / Enterovirus NOT DETECTED NOT DETECTED Final   Influenza A NOT DETECTED NOT DETECTED Final   Influenza B NOT DETECTED NOT DETECTED Final   Parainfluenza Virus 1 NOT DETECTED NOT DETECTED Final   Parainfluenza Virus 2 NOT DETECTED NOT DETECTED Final   Parainfluenza Virus 3 NOT DETECTED NOT DETECTED Final   Parainfluenza Virus 4 NOT DETECTED NOT DETECTED Final   Respiratory Syncytial Virus NOT DETECTED NOT DETECTED Final  Bordetella pertussis NOT DETECTED NOT DETECTED Final   Chlamydophila pneumoniae NOT DETECTED NOT DETECTED Final   Mycoplasma pneumoniae NOT DETECTED NOT DETECTED Final    Comment: Performed at [facility], [address] [place], [place] [address]  Culture, blood (Routine x 2)     Status: None (Preliminary result)   Collection Time: 11/06/18  9:10 PM  Result Value Ref Range Status   Specimen Description   Final    BLOOD RIGHT HAND Performed at [facility], [address] [place], [place] [address]    Special Requests   Final    BOTTLES DRAWN AEROBIC AND ANAEROBIC Blood  Culture adequate volume Performed at [facility], [address], [place], [place] [address]    Culture   Final    NO GROWTH 3 DAYS Performed at [facility], [address] [place], [place] [address]    Report Status PENDING  Incomplete  Culture, blood (Routine x 2)     Status: None (Preliminary result)   Collection Time: 11/07/18 12:03 AM  Result Value Ref Range Status   Specimen Description   Final    BLOOD RIGHT HAND Performed at [facility], [address] [place], [place] [address]    Special Requests   Final    BOTTLES DRAWN AEROBIC ONLY Blood Culture results may not be optimal due to an inadequate volume of blood received in culture bottles Performed at [facility], [address], [place], [place] [address]    Culture   Final    NO GROWTH 3 DAYS Performed at [facility], [address] [place], [place] [address]    Report Status PENDING  Incomplete  MRSA PCR Screening     Status: None   Collection Time: 11/07/18 12:15 AM  Result Value Ref Range Status   MRSA by PCR NEGATIVE NEGATIVE Final    Comment:        The GeneXpert MRSA Assay (FDA approved for NASAL specimens only), is one component of a comprehensive MRSA colonization surveillance program. It is not intended to diagnose MRSA infection nor to guide or monitor treatment for MRSA infections. Performed at [facility], [address] [place], [place] [address]   SARS Coronavirus 2 (CEPHEID- Performed in [facility] hospital lab), Hosp Order     Status: None   Collection Time: 11/07/18  9:10 AM  Result Value Ref Range Status   SARS Coronavirus 2 NEGATIVE NEGATIVE Final    Comment: (NOTE) If result is NEGATIVE SARS-CoV-2 target nucleic acids are NOT DETECTED. The SARS-CoV-2 RNA is generally detectable in upper and lower  respiratory specimens during the acute phase of infection. The lowest  concentration of  SARS-CoV-2 viral copies this assay can detect is 250  copies / mL. A negative result does not preclude SARS-CoV-2 infection  and should not be used as the sole basis for treatment or other  patient management decisions.  A negative result may occur with  improper specimen collection / handling, submission of specimen other  than nasopharyngeal swab, presence of viral mutation(s) within the  areas targeted by this assay, and inadequate number of viral copies  (<250 copies / mL). A negative result must be combined with clinical  observations, patient history, and epidemiological information. If result is POSITIVE SARS-CoV-2 target nucleic acids are DETECTED. The SARS-CoV-2 RNA is generally detectable in upper and lower  respiratory specimens dur ing the acute phase  of infection.  Positive  results are indicative of active infection with SARS-CoV-2.  Clinical  correlation with patient history and other diagnostic information is  necessary to determine patient infection status.  Positive results do  not rule out bacterial infection or co-infection with other viruses. If result is PRESUMPTIVE POSTIVE SARS-CoV-2 nucleic acids MAY BE PRESENT.   A presumptive positive result was obtained on the submitted specimen  and confirmed on repeat testing.  While 2019 novel coronavirus  (SARS-CoV-2) nucleic acids may be present in the submitted sample  additional confirmatory testing may be necessary for epidemiological  and / or clinical management purposes  to differentiate between  SARS-CoV-2 and other Sarbecovirus currently known to infect humans.  If clinically indicated additional testing with an alternate test  methodology [phone]) is advised. The SARS-CoV-2 RNA is generally  detectable in upper and lower respiratory sp ecimens during the acute  phase of infection. The expected result is Negative. Fact Sheet for Patients:  [employer]ilerBrush.com.cy Fact Sheet for Healthcare  Providers: https://pope.com/ This test is not yet approved or cleared by the [place] FDA and has been authorized for detection and/or diagnosis of SARS-CoV-2 by FDA under an Emergency Use Authorization (EUA).  This EUA will remain in effect (meaning this test can be used) for the duration of the COVID-19 declaration under Section 564(b)(1) of the Act, 21 U.S.C. section 360bbb-3(b)(1), unless the authorization is terminated or revoked sooner. Performed at [facility], [address] [place], [place] [address]   Culture, sputum-assessment     Status: None   Collection Time: 11/07/18 12:55 PM  Result Value Ref Range Status   Specimen Description SPUTUM  Final   Special Requests Normal  Final   Sputum evaluation   Final    THIS SPECIMEN IS ACCEPTABLE FOR SPUTUM CULTURE Performed at [facility], [address], [place], [place] [address]    Report Status 11/07/2018 FINAL  Final  Culture, respiratory     Status: None   Collection Time: 11/07/18 12:55 PM  Result Value Ref Range Status   Specimen Description   Final    SPUTUM Performed at [facility], [address] [place], [place] [address]    Special Requests   Final    Normal Reflexed from [phone] Performed at [facility], [address], [place], [place] [address]    Gram Stain   Final    RARE WBC PRESENT, PREDOMINANTLY PMN FEW YEAST RARE GRAM POSITIVE RODS RARE GRAM NEGATIVE RODS RARE GRAM POSITIVE COCCI IN PAIRS    Culture   Final    FEW Consistent with normal respiratory flora. Performed at [facility], [address] [place], [place] [address]    Report Status 11/09/2018 FINAL  Final  Novel Coronavirus,NAA,(SEND-OUT TO REF LAB - TAT 24-48 hrs); Hosp Order     Status: None   Collection Time: 11/07/18  7:38 PM  Result Value Ref Range Status   SARS-CoV-2, NAA NOT DETECTED NOT DETECTED Final    Comment:  (NOTE) Testing was performed using the cobas(R) SARS-CoV-2 test. This test was developed and its performance characteristics determined by [employer]. This test has not been FDA cleared or approved. This test has been authorized by FDA under an Emergency Use Authorization (EUA). This test is only authorized for the duration of time the declaration that circumstances exist justifying the authorization of the emergency use of in vitro diagnostic tests for detection of SARS-CoV-2 virus and/or  diagnosis of COVID-19 infection under section 564(b)(1) of the Act, 21 U.S.C. 191YNW-2(N)(5), unless the authorization is terminated or revoked sooner. When diagnostic testing is negative, the possibility of a false negative result should be considered in the context of a patient's recent exposures and the presence of clinical signs and symptoms consistent with COVID-19. An individual without symptoms of COVID-19 and who is not shedding SARS-CoV-2 virus would expect to have  a negative (not detected) result in this assay. Performed At: [facility] [address] [place], [place] [id number] Jolene Schimke MD QI:[id number]    Coronavirus Source NASOPHARYNGEAL  Final    Comment: Performed at [facility], [address], [place], [place] [address]     Time coordinating discharge: 33 minutes  SIGNED:   Alwyn Ren, MD  [facility] 11/11/2018, 10:44 AM Pager   If 7PM-7AM, please contact night-coverage [email or web address] Password [email or web address]

## 2018-11-11 NOTE — Progress Notes (Signed)
Inpatient Diabetes Program Recommendations  AACE/ADA: New Consensus Statement on Inpatient Glycemic Control (2015)  Target Ranges:  Prepandial:   less than 140 mg/dL      Peak postprandial:   less than 180 mg/dL (1-2 hours)      Critically ill patients:  140 - 180 mg/dL   Lab Results  Component Value Date   GLUCAP 172 (H) 11/11/2018   HGBA1C 13.3 (H) 11/07/2018    Review of Glycemic Control Results for Marc Wright, Marc Wright (MRN [id number]) as of 11/11/2018 10:29  Ref. Range 11/10/2018 16:09 11/10/2018 19:48 11/11/2018 00:19 11/11/2018 04:20 11/11/2018 07:29  Glucose-Capillary Latest Ref Range: 70 - 99 mg/dL 947 (H) 096 (H) 283 (H) 161 (H) 172 (H)   Inpatient Diabetes Program Recommendations:   Patient transitioned to Novolog 70/30 insulin mix yesterday but also received meal coverage Novolog 12 units + correction.  -Increase Novolog 70/30 to 40 units bid ac meals  Thank you, Billy Fischer. Ceairra Mccarver, RN, MSN, CDE  Diabetes Coordinator Inpatient Glycemic Control Team Team Pager [phone] (8am-5pm) 11/11/2018 10:30 AM

## 2018-11-11 NOTE — Progress Notes (Signed)
SATURATION QUALIFICATIONS: (This note is used to comply with regulatory documentation for home oxygen)  Patient Saturations on Room Air at Rest = 97%  Patient Saturations on Room Air while Ambulating = 90%  Patient Saturations on 0 Liters of oxygen while Ambulating = N/A  Please briefly explain why patient needs home oxygen: patient only dropped to 90% on room air while ambulating for a second then came back up to about 92%; patient doesn't need oxygen at home right now.

## 2018-11-11 NOTE — TOC Transition Note (Signed)
Transition of Care [facility]) - CM/SW Discharge Note   Patient Details  Name: Marc Wright MRN: [id number] Date of Birth: [birth year]  [facility] CM/SW Contact:  Lanier Clam, RN Phone Number: 11/11/2018, 11:11 AM   Clinical Narrative: CM referral for meds asst-TC [facility] pharmacy spoke to [facility] about insulin-humulin 70/30 vial 30ml & dose per attending , & syringes can be filled for free, e script to [facility] pharmacy-attending notified. Patient in agreement to d/c plan.[facility] pcp appt set on f/u provider list. No further CM needs.     Final next level of care: Home/Self Care Barriers to Discharge: No Barriers Identified   Patient Goals and CMS Choice Patient states their goals for this hospitalization and ongoing recovery are:: get rid of whatever is in my lungs      Discharge Placement                       Discharge Plan and Services   Discharge Planning Services: CM Consult                                 Social Determinants of Health (SDOH) Interventions     Readmission Risk Interventions No flowsheet data found.

## 2018-11-12 LAB — CULTURE, BLOOD (ROUTINE X 2)
Culture: NO GROWTH
Culture: NO GROWTH
Special Requests: ADEQUATE

## 2018-11-19 ENCOUNTER — Other Ambulatory Visit: Payer: Self-pay

## 2018-11-19 ENCOUNTER — Other Ambulatory Visit: Payer: Self-pay | Admitting: Pharmacist

## 2018-11-19 ENCOUNTER — Encounter: Payer: Self-pay | Admitting: Family Medicine

## 2018-11-19 ENCOUNTER — Ambulatory Visit: Payer: Self-pay | Attending: Nurse Practitioner | Admitting: Nurse Practitioner

## 2018-11-19 ENCOUNTER — Encounter: Payer: Self-pay | Admitting: Nurse Practitioner

## 2018-11-19 ENCOUNTER — Inpatient Hospital Stay: Payer: Self-pay | Admitting: Family Medicine

## 2018-11-19 DIAGNOSIS — L03116 Cellulitis of left lower limb: Secondary | ICD-10-CM

## 2018-11-19 MED ORDER — INSULIN NPH ISOPHANE & REGULAR (70-30) 100 UNIT/ML ~~LOC~~ SUSP: 40.0000 [IU] | Freq: Two times a day (BID) | SUBCUTANEOUS | 2 refills | Status: DC

## 2018-11-19 MED ORDER — SULFAMETHOXAZOLE-TRIMETHOPRIM 800-160 MG PO TABS: 1.0000 | ORAL_TABLET | Freq: Two times a day (BID) | ORAL | 0 refills | Status: DC

## 2018-11-19 MED FILL — SULFAMETHOXAZOLE-TMP DS TAB: 800-160 | 7 days supply | Qty: 14 | Fill #0

## 2018-11-19 MED FILL — ?HUMULIN 70/30 VIAL: (70-30) 100 | 25 days supply | Qty: 20 | Fill #0

## 2018-11-19 NOTE — Progress Notes (Signed)
Virtual Visit via Telephone Note Due to national recommendations of social distancing due to COVID 19, telehealth visit is felt to be most appropriate for this patient at this time.  I discussed the limitations, risks, security and privacy concerns of performing an evaluation and management service by telephone and the availability of in person appointments. I also discussed with the patient that there may be a patient responsible charge related to this service. The patient expressed understanding and agreed to proceed.    I connected with Marc Wright on 11/19/18  at   3:10 PM EDT  EDT by telephone and verified that I am speaking with the correct person using two identifiers.   Consent I discussed the limitations, risks, security and privacy concerns of performing an evaluation and management service by telephone and the availability of in person appointments. I also discussed with the patient that there may be a patient responsible charge related to this service. The patient expressed understanding and agreed to proceed.   Location of Patient: Private Residence    Location of Provider: [facility]    Persons participating in Telemedicine visit: Bertram Denver FNP-BC [place] CMA Marc Wright    History of Present Illness:   Telemedicine visit for: Leg swelling.   has a past medical history of Asthma, CHF (congestive heart failure) (HCC), Diabetes mellitus without complication (HCC), OSA on CPAP, and Pneumonia (06/2016).  He endorses bilateral leg swelling and redness. This is not new for him. He recently finished keflex for left leg cellulitis and states there is still erythema and swelling. Pain is not intense. As this is a tele visit today I have requested that he upload pictures of his leg via my chart. I will also send in bactrim for resistant cellulitis.  He is able to walk but states there is some discomfort in the lower left leg. He also endorses  bilateral LE edema for which he takes lasix 80 mg BID with little relief of swelling. I have instructed him to also wear compression socks for the swelling. He denies any recently increased shortness of breath. He has a history of CHF and is being followed by Cardiology. Taking lisinopril, lasix and Coreg prescribed.   Past Medical History:  Diagnosis Date  . Asthma   . CHF (congestive heart failure) (HCC)   . Diabetes mellitus without complication (HCC)   . OSA on CPAP   . Pneumonia 06/2016    Past Surgical History:  Procedure Laterality Date  . I&D EXTREMITY    . SKIN GRAFT      Family History  Problem Relation Age of Onset  . Congestive Heart Failure Mother   . Hypertension Mother   . Stroke Father        2017  . Asthma Father     Social History   Socioeconomic History  . Marital status: Divorced    Spouse name: Not on file  . Number of children: 1  . Years of education: Not on file  . Highest education level: Associate degree: occupational, Scientist, product/process development, or vocational program  Occupational History  . Not on file  Social Needs  . Financial resource strain: Somewhat hard  . Food insecurity:    Worry: Never true    Inability: Never true  . Transportation needs:    Medical: No    Non-medical: No  Tobacco Use  . Smoking status: Current Every Day Smoker    Packs/day: 0.75    Types: Cigarettes  Start date: 11/23/2016  . Smokeless tobacco: Never Used  Substance and Sexual Activity  . Alcohol use: Yes    Comment: occasional   . Drug use: No  . Sexual activity: Not Currently  Lifestyle  . Physical activity:    Days per week: 3 days    Minutes per session: 60 min  . Stress: Very much  Relationships  . Social connections:    Talks on phone: Once a week    Gets together: Once a week    Attends religious service: Never    Active member of club or organization: No    Attends meetings of clubs or organizations: Never    Relationship status: Divorced  Other Topics  Concern  . Not on file  Social History Narrative  . Not on file     Observations/Objective: Awake, alert and oriented x 3   Review of Systems  Constitutional: Negative for fever, malaise/fatigue and weight loss.  HENT: Negative.  Negative for nosebleeds.   Eyes: Negative.  Negative for blurred vision, double vision and photophobia.  Respiratory: Negative.  Negative for cough and shortness of breath.   Cardiovascular: Positive for leg swelling. Negative for chest pain and palpitations.  Gastrointestinal: Negative.  Negative for heartburn, nausea and vomiting.  Musculoskeletal: Negative.  Negative for myalgias.  Neurological: Negative.  Negative for dizziness, focal weakness, seizures and headaches.  Psychiatric/Behavioral: Negative.  Negative for suicidal ideas.    Assessment and Plan: Airic was seen today for hospitalization follow-up.  Diagnoses and all orders for this visit:  Left leg cellulitis -    sulfamethoxazole-trimethoprim (BACTRIM DS) 800-160 MG tablet; Take 1 tablet by mouth 2 (two) times daily for 7 days.     Follow Up Instructions Return in about 2 weeks (around 12/03/2018) for PCP.     I discussed the assessment and treatment plan with the patient. The patient was provided an opportunity to ask questions and all were answered. The patient agreed with the plan and demonstrated an understanding of the instructions.   The patient was advised to call back or seek an in-person evaluation if the symptoms worsen or if the condition fails to improve as anticipated.  I provided 23 minutes of non-face-to-face time during this encounter including median intraservice time, reviewing previous notes, labs, imaging, medications and explaining diagnosis and management.  Claiborne Rigg, FNP-BC

## 2018-11-22 ENCOUNTER — Inpatient Hospital Stay: Payer: Self-pay | Admitting: Nurse Practitioner

## 2018-11-22 NOTE — Telephone Encounter (Signed)
Patient was scheduled to establish care with you. He uploaded his current cellulitis and swelling.

## 2018-11-24 ENCOUNTER — Other Ambulatory Visit: Payer: Self-pay

## 2018-11-24 ENCOUNTER — Encounter: Payer: Self-pay | Admitting: Family Medicine

## 2018-11-24 ENCOUNTER — Ambulatory Visit: Payer: Self-pay | Attending: Family Medicine | Admitting: Family Medicine

## 2018-11-24 VITALS — BP 113/77 | HR 103 | Temp 98.3°F | Ht 75.0 in | Wt 392.6 lb

## 2018-11-24 DIAGNOSIS — J4520 Mild intermittent asthma, uncomplicated: Secondary | ICD-10-CM

## 2018-11-24 DIAGNOSIS — I5022 Chronic systolic (congestive) heart failure: Secondary | ICD-10-CM

## 2018-11-24 DIAGNOSIS — Z9989 Dependence on other enabling machines and devices: Secondary | ICD-10-CM

## 2018-11-24 DIAGNOSIS — R609 Edema, unspecified: Secondary | ICD-10-CM

## 2018-11-24 DIAGNOSIS — I872 Venous insufficiency (chronic) (peripheral): Secondary | ICD-10-CM

## 2018-11-24 DIAGNOSIS — E1165 Type 2 diabetes mellitus with hyperglycemia: Secondary | ICD-10-CM

## 2018-11-24 DIAGNOSIS — Z09 Encounter for follow-up examination after completed treatment for conditions other than malignant neoplasm: Secondary | ICD-10-CM

## 2018-11-24 DIAGNOSIS — R600 Localized edema: Secondary | ICD-10-CM

## 2018-11-24 DIAGNOSIS — J189 Pneumonia, unspecified organism: Secondary | ICD-10-CM

## 2018-11-24 DIAGNOSIS — G4733 Obstructive sleep apnea (adult) (pediatric): Secondary | ICD-10-CM

## 2018-11-24 DIAGNOSIS — L03116 Cellulitis of left lower limb: Secondary | ICD-10-CM

## 2018-11-24 DIAGNOSIS — Z6841 Body Mass Index (BMI) 40.0 and over, adult: Secondary | ICD-10-CM

## 2018-11-24 MED ORDER — SULFAMETHOXAZOLE-TRIMETHOPRIM 800-160 MG PO TABS: 1.0000 | ORAL_TABLET | Freq: Two times a day (BID) | ORAL | 0 refills | Status: AC

## 2018-11-24 MED ORDER — TORSEMIDE 10 MG PO TABS: 10.0000 mg | ORAL_TABLET | Freq: Every day | ORAL | 0 refills | Status: DC

## 2018-11-24 MED FILL — TORSEMIDE 10 MG TABLET: 10 | 30 days supply | Qty: 30 | Fill #0

## 2018-11-24 MED FILL — ?SULFAMETHOXAZOLE-TMP DS TB: 800-160 | 7 days supply | Qty: 14 | Fill #0

## 2018-11-24 NOTE — Progress Notes (Signed)
Per pt f/u from hospital for Cellulitis in the left leg.

## 2018-11-24 NOTE — Progress Notes (Signed)
New Patient Office Visit  Subjective:  Patient ID: Marc Wright, male    DOB: 04-29-1976  Age: 43 y.o. MRN: [id number]  CC:  Chief Complaint  Patient presents with  . Hospitalization Follow-up    HPI Marc Wright presents to establish care and patient is status post hospital discharge.  He has longstanding medical issues including congestive heart failure, morbid obesity, obstructive sleep apnea, type 2 diabetes and asthma.  Patient has issues with chronic lower extremity edema.  He is followed by cardiology.  Patient was hospitalized from 11/06/2018 through 11/11/2018 after developing onset of chest pain x2 days with radiation to his left upper back/left arm and a sensation of dullness in his throat/neck.  He was also having some increased shortness of breath and cough.  He had a negative COVID test in the ED as he was found to have temperature of 102.6 and temperature was checked later rectally and was elevated at 104.7 and chest x-ray showed possible right middle lobe pneumonia.  He was started on Rocephin and azithromycin.  Chest CT showed bilateral multifocal pneumonia which is often associated with COVID-19 infection.  Patient reports that he actually had 3 different negative test for COVID-19 during his hospitalization.  Patient's blood sugar in the emergency department was 326.  He was also found to have cellulitis of his left lower leg.     Patient reports that he now feels much better.  Patient states that he was on antibiotics, Keflex, status post hospitalization for the cellulitis.  Patient however continued to have increased area of redness on his leg as well as discomfort on the left lower leg/shin in the area of the cellulitis and patient called the office.  Patient states that he was placed on Septra DS which he has taken for about 4 days now and he feels that this medicine has reduced the erythema and discomfort in his leg and the area of redness has not increased.  He admits that  prior to his hospitalization he was not checking his blood sugars regularly.  Patient reports that he has been less active because he has not been working due to the COVID-19 pandemic.  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  Patient has moved back in with his parents.  He reports that his younger brother is diabetic and patient has been cooking healthy meals for the family so he is familiar with making low carbohydrate meals.  He states that he forgot to bring his glucometer when he moved in with his parents and had not checked his blood sugars for 3 to 4 weeks prior to his hospitalization.  Patient also has a history of obstructive sleep apnea and admits that prior to his hospitalization he was not using his CPAP machine regularly but after hospitalization he is now using his CPAP machine nightly.  He reports that sometimes his CPAP mask is not situated properly when he wakes up in the morning because he has difficulty sleeping on his side and when sleeping on his side the mask tends to get pushed into a different position while he is asleep.  He has also been elevating his feet to try and help with the peripheral edema but he does not feel that the edema is responding as well to the Lasix at this time.  Past Medical History:  Diagnosis Date  . Asthma   . CHF (congestive heart failure) (HCC)   .  Diabetes mellitus without complication (HCC)   . OSA on CPAP   . Pneumonia 06/2016    Past Surgical History:  Procedure Laterality Date  . I&D EXTREMITY    . SKIN GRAFT      Family History  Problem Relation Age of Onset  . Congestive Heart Failure Mother   . Hypertension Mother   . Stroke Father        2017  . Asthma Father    Social History   Tobacco Use  . Smoking status: Current Every Day Smoker    Packs/day: 0.75    Types: Cigarettes    Start date: 11/23/2016  . Smokeless tobacco: Never Used  Substance Use Topics  . Alcohol use: Yes     Comment: occasional   . Drug use: No    ROS Review of Systems  Constitutional: Positive for fatigue. Negative for chills and fever.  HENT: Negative for sore throat and trouble swallowing.   Eyes: Negative for photophobia and visual disturbance.  Respiratory: Positive for shortness of breath. Negative for cough.   Cardiovascular: Positive for leg swelling. Negative for chest pain and palpitations.  Gastrointestinal: Negative for abdominal pain, constipation, diarrhea and nausea.  Endocrine: Negative for cold intolerance, heat intolerance, polydipsia, polyphagia and polyuria.  Genitourinary: Negative for dysuria and frequency.  Musculoskeletal: Positive for arthralgias and gait problem.  Skin: Positive for color change. Negative for wound.  Neurological: Negative for dizziness and headaches.  Hematological: Negative for adenopathy. Does not bruise/bleed easily.  Psychiatric/Behavioral: Negative for sleep disturbance. The patient is not nervous/anxious.     Objective:   Today's Vitals: BP 113/77 (BP Location: Right Arm, Patient Position: Sitting, Cuff Size: Large)   Pulse (!) 103   Temp 98.3 F (36.8 C) (Oral)   Ht  (1.905 m)   Wt (!) 392 lb 9.6 oz (178.1 kg)   SpO2 94%   BMI 49.07 kg/m   Physical Exam Vitals signs and nursing note reviewed.  Constitutional:      Appearance: Normal appearance. He is obese. He is not ill-appearing.  Neck:     Musculoskeletal: Normal range of motion and neck supple. No muscular tenderness.     Vascular: No carotid bruit.     Comments: No JVD Cardiovascular:     Rate and Rhythm: Normal rate and regular rhythm.  Pulmonary:     Effort: Pulmonary effort is normal.     Breath sounds: Normal breath sounds.  Abdominal:     Palpations: Abdomen is soft.     Tenderness: There is no abdominal tenderness. There is no right CVA tenderness, left CVA tenderness, guarding or rebound.  Musculoskeletal:        General: Swelling and tenderness  present.     Right lower leg: Edema present.     Left lower leg: Edema present.     Comments: Patient with complaint of some mild discomfort with palpation over the left lower to mid shin an area above the ankle and the area of cellulitis but no active skin breakdown and no weeping of the skin in this area  Lymphadenopathy:     Cervical: No cervical adenopathy.  Skin:    Findings: Erythema present.     Comments: Patient with hypertrophic/thickened skin on the lower extremities from midcalf down bilaterally as well as presence of 2+ pitting edema to the area below the knees bilaterally.  Patient does have some erythema of both lower extremities that is greater on the left as well as some  increased warmth on the left from just above the ankle to mid shin and skin is warm to touch.  Presence of bilateral pedal edema.  Neurological:     General: No focal deficit present.     Mental Status: He is alert and oriented to person, place, and time.  Psychiatric:        Mood and Affect: Mood normal.        Behavior: Behavior normal.        Thought Content: Thought content normal.        Judgment: Judgment normal.     Assessment & Plan:  1. Uncontrolled type 2 diabetes mellitus with hyperglycemia (HCC) Patient was found to have elevated blood sugars upon presentation to the emergency department as recent hospitalization.  Patient's elevated blood sugars may in part have been secondary to his pneumonia as well as cellulitis.  Patient reports that his blood sugars have improved status post hospitalization but he is not checking daily as he does not have his own glucometer.  He is encouraged to go back to his apartment to get his glucometer and start checking blood sugars at least twice daily and contact the office if blood sugars are remaining greater than 140 fasting.  Patient will continue medications for treatment of diabetes as per hospital discharge.  He is currently on NPH 70 3040 units twice daily.   2. Chronic systolic heart failure (HCC); 5.  Venous stasis dermatitis; 6.  Peripheral edema Patient with chronic heart failure for which he is followed by cardiology.  Patient with evidence of venous stasis changes of the lower extremities bilaterally but no current skin breakdown related to his chronic swelling.  He does have some cellulitis present at today's visit.  He reports that he is not having significant improvement in his lower extremity edema and he also does not feel as if his leg cysts is working as well as it has in the past.  Patient will add Demadex x3 days to his current Lasix to see if this will help improve his edema.  He is to continue to keep his legs elevated and continue use of current medications including lisinopril and Coreg for CHF.  Continue cardiology follow-up. - Basic Metabolic Panel - Glucose (CBG) - torsemide (DEMADEX) 10 MG tablet; Take 1 tablet (10 mg total) by mouth daily. In the morning to decrease leg swelling; continue lasix  Dispense: 30 tablet; Refill: 0  3. Cellulitis of left lower extremity Patient is to add the use of Demadex 10 mg daily along with his current Lasix to see if this will help with his peripheral edema.  Patient may be having increased peripheral edema due to his cellulitis of the left lower extremity.  Patient states that the cellulitis has been improving since he has been on Septra DS for the past few days instead of the Keflex that he was discharged on from his hospital stay.  Patient given additional Septra DS to continue another 7 days of therapy.  Patient should call or return sooner if he has increased redness, leg pain or any other issues. - torsemide (DEMADEX) 10 MG tablet; Take 1 tablet (10 mg total) by mouth daily. In the morning to decrease leg swelling; continue lasix  Dispense: 30 tablet; Refill: 0 - sulfamethoxazole-trimethoprim (BACTRIM DS) 800-160 MG tablet; Take 1 tablet by mouth 2 (two) times daily for 7 days.  Dispense: 14  tablet; Refill: 0  4. Community acquired pneumonia, unspecified laterality Patient is status post hospitalization  for bilateral multifocal pneumonia with negative COVID testing x3.  Patient during hospitalization was treated with azithromycin and Rocephin but did develop a rash and it was not known if this was due to 1 of his antibiotics but his azithromycin was stopped.  Patient reports that he feels that his shortness of breath and cough have improved status post hospitalization.  But because patient has CHF, he could also be having cough for this reason.  Patient will have CBC at today's visit to make sure that he is not having any increase in white blood cell count and patient has also been asked to obtain chest x-ray within the next 7 days to make sure that pneumonia has resolved and that there is also no evidence of pulmonary edema from his CHF. - CBC with Differential - DG Chest 2 View; Future  7.  Obstructive sleep apnea with CPAP use; 8.  Morbid obesity with BMI of 49 Compliance with nightly use of CPAP was encouraged as discussed with the patient that obstructive sleep apnea can also cause/contribute to CHF as well as cardiac arrhythmias which may result in sudden death.  Discussed with patient that he can obtain a custom pillow from a home care company that is designed for people who also wear CPAP so that he can wear his mask more comfortably.  Patient was also encouraged to follow a low-fat diet and Atkins type diet was discussed with low carbohydrates and healthy fats as well as lean meats to help with his obesity which also contributes to his diabetes, CHF and sleep apnea  9.  Mild intermittent asthma He reports no current issues with chest tightness, wheezing related to his asthma.  He feels that his asthma is well controlled  10.  Hospital discharge follow-up Records related to patient's recent hospitalization including emergency department notes, admission H&P, discharge summary,  imaging and labs were reviewed and discussed with the patient at today's visit.  He was scheduled for chest x-ray as per hospital discharge notes in follow-up of recent diagnosis and hospitalization due to multifocal bilateral pneumonia and patient will have BMP in follow-up of CHF and medication use as well as CBC in follow-up of recent illness as per hospital discharge records.  Medications were discussed and reviewed with the patient at today's visit.  Patient will follow-up in 1 week.  All questions were answered to patient's satisfaction.  Outpatient Encounter Medications as of 11/24/2018  Medication Sig  . B Complex-C (B-COMPLEX WITH VITAMIN C) tablet Take 1 tablet by mouth daily.  [name] CALCIUM/MAGNESIUM/ZINC FORMULA PO Take 1 tablet by mouth daily as needed (muscle cramps).   . carvedilol (COREG) 12.5 MG tablet Take 1 tablet (12.5 mg total) by mouth 2 (two) times daily with a meal.  . furosemide (LASIX) 80 MG tablet Take 1 tablet (80 mg total) by mouth 2 (two) times daily.  . insulin NPH-regular Human (HUMULIN 70/30) (70-30) 100 UNIT/ML injection Inject 40 Units into the skin 2 (two) times daily with a meal.  . Insulin Syringe-Needle U-100 (INSULIN SYRINGE .5CC/31GX5/16") 31G X 5/16" 0.5 ML MISC 40 units twice a day Humulin 70/30  . lisinopril (PRINIVIL,ZESTRIL) 5 MG tablet Take 1 tablet (5 mg total) by mouth daily.  . multivitamin (ONE-A-DAY MEN'S) TABS tablet Take 1 tablet by mouth daily.  . niacin 250 MG tablet Take 250 mg by mouth at bedtime.  . sulfamethoxazole-trimethoprim (BACTRIM DS) 800-160 MG tablet Take 1 tablet by mouth 2 (two) times daily for 7 days.  [name]  fexofenadine (ALLEGRA) 180 MG tablet Take 1 tablet (180 mg total) by mouth daily. Taking as needed (Patient not taking: Reported on 11/19/2018)  . nicotine (NICODERM CQ - DOSED IN MG/24 HOURS) 14 mg/24hr patch Place 1 patch (14 mg total) onto the skin daily. (Patient not taking: Reported on 11/19/2018)   No facility-administered  encounter medications on file as of 11/24/2018.     Follow-up: Return in about 2 weeks (around 12/08/2018) for cellulitis follow-up; sooner if not improving or worsening.  Cain Saupe, MD

## 2018-11-25 ENCOUNTER — Other Ambulatory Visit: Payer: Self-pay | Admitting: Family Medicine

## 2018-11-25 DIAGNOSIS — E875 Hyperkalemia: Secondary | ICD-10-CM

## 2018-11-25 LAB — CBC WITH DIFFERENTIAL/PLATELET
Basophils Absolute: 0.1 x10E3/uL (ref 0.0–0.2)
Basos: 2 %
EOS (ABSOLUTE): 0.4 x10E3/uL (ref 0.0–0.4)
Eos: 5 %
Hematocrit: 48.9 % (ref 37.5–51.0)
Hemoglobin: 15.6 g/dL (ref 13.0–17.7)
Immature Grans (Abs): 0.1 x10E3/uL (ref 0.0–0.1)
Immature Granulocytes: 1 %
Lymphocytes Absolute: 2 x10E3/uL (ref 0.7–3.1)
Lymphs: 24 %
MCH: 28 pg (ref 26.6–33.0)
MCHC: 31.9 g/dL (ref 31.5–35.7)
MCV: 88 fL (ref 79–97)
Monocytes Absolute: 0.5 x10E3/uL (ref 0.1–0.9)
Monocytes: 6 %
Neutrophils Absolute: 5.1 x10E3/uL (ref 1.4–7.0)
Neutrophils: 62 %
Platelets: 562 x10E3/uL — ABNORMAL HIGH (ref 150–450)
RBC: 5.57 x10E6/uL (ref 4.14–5.80)
RDW: 13.9 % (ref 11.6–15.4)
WBC: 8.2 x10E3/uL (ref 3.4–10.8)

## 2018-11-25 LAB — BASIC METABOLIC PANEL WITH GFR
BUN/Creatinine Ratio: 20 (ref 9–20)
BUN: 15 mg/dL (ref 6–24)
CO2: 25 mmol/L (ref 20–29)
Calcium: 9.7 mg/dL (ref 8.7–10.2)
Chloride: 94 mmol/L — ABNORMAL LOW (ref 96–106)
Creatinine, Ser: 0.76 mg/dL (ref 0.76–1.27)
GFR calc Af Amer: 127 mL/min/1.73
GFR calc non Af Amer: 110 mL/min/1.73
Glucose: 231 mg/dL — ABNORMAL HIGH (ref 65–99)
Potassium: 5.4 mmol/L — ABNORMAL HIGH (ref 3.5–5.2)
Sodium: 137 mmol/L (ref 134–144)

## 2018-11-25 NOTE — Progress Notes (Signed)
Patient ID: Marc Wright, male   DOB: 1975-07-01, 43 y.o.   MRN: [id number]   Potassium was elevated at 5.4 on recent blood work. Patient will be asked to come in for potassium level recheck, continue fluid pills and try to limit foods high in potassium.

## 2018-11-26 ENCOUNTER — Encounter: Payer: Self-pay | Admitting: Family Medicine

## 2018-11-26 DIAGNOSIS — J4520 Mild intermittent asthma, uncomplicated: Secondary | ICD-10-CM | POA: Insufficient documentation

## 2018-11-26 DIAGNOSIS — E1165 Type 2 diabetes mellitus with hyperglycemia: Secondary | ICD-10-CM | POA: Insufficient documentation

## 2018-11-26 DIAGNOSIS — I872 Venous insufficiency (chronic) (peripheral): Secondary | ICD-10-CM | POA: Insufficient documentation

## 2018-11-26 DIAGNOSIS — Z6841 Body Mass Index (BMI) 40.0 and over, adult: Secondary | ICD-10-CM | POA: Insufficient documentation

## 2018-11-27 ENCOUNTER — Encounter: Payer: Self-pay | Admitting: Nurse Practitioner

## 2018-12-19 ENCOUNTER — Emergency Department: Payer: Self-pay

## 2018-12-19 ENCOUNTER — Other Ambulatory Visit: Payer: Self-pay

## 2018-12-19 ENCOUNTER — Encounter: Payer: Self-pay | Admitting: Emergency Medicine

## 2018-12-19 ENCOUNTER — Emergency Department
Admission: EM | Admit: 2018-12-19 | Discharge: 2018-12-19 | Disposition: A | Discharge status: Home / Self Care | Payer: Self-pay | Attending: Emergency Medicine | Admitting: Emergency Medicine

## 2018-12-19 DIAGNOSIS — Z79899 Other long term (current) drug therapy: Secondary | ICD-10-CM | POA: Insufficient documentation

## 2018-12-19 DIAGNOSIS — J4520 Mild intermittent asthma, uncomplicated: Secondary | ICD-10-CM | POA: Insufficient documentation

## 2018-12-19 DIAGNOSIS — E119 Type 2 diabetes mellitus without complications: Secondary | ICD-10-CM | POA: Insufficient documentation

## 2018-12-19 DIAGNOSIS — I5022 Chronic systolic (congestive) heart failure: Secondary | ICD-10-CM | POA: Insufficient documentation

## 2018-12-19 DIAGNOSIS — I110 Hypertensive heart disease with heart failure: Secondary | ICD-10-CM | POA: Insufficient documentation

## 2018-12-19 DIAGNOSIS — M79661 Pain in right lower leg: Secondary | ICD-10-CM | POA: Insufficient documentation

## 2018-12-19 DIAGNOSIS — F17210 Nicotine dependence, cigarettes, uncomplicated: Secondary | ICD-10-CM | POA: Insufficient documentation

## 2018-12-19 DIAGNOSIS — Z794 Long term (current) use of insulin: Secondary | ICD-10-CM | POA: Insufficient documentation

## 2018-12-19 MED ORDER — HYDROCODONE-ACETAMINOPHEN 5-325 MG PO TABS: 1.0000 | ORAL_TABLET | Freq: Four times a day (QID) | ORAL | 0 refills | Status: DC | PRN

## 2018-12-19 MED ORDER — HYDROCODONE-ACETAMINOPHEN 5-325 MG PO TABS
1.0000 | ORAL_TABLET | Freq: Once | ORAL | Status: AC
  Administered 2018-12-19: 1 via ORAL
  Filled 2018-12-19: qty 1

## 2018-12-19 NOTE — ED Notes (Signed)
Pt awake and alert in waiting room, waiting patiently for treatment room to see provider; pt understands he will get [place] results at that time

## 2018-12-19 NOTE — ED Triage Notes (Signed)
Pt presnets to ED via POV with c/o RLE pain that started this morning. Pt states pain worse with ambulation. Pt states pain 6/10.

## 2018-12-19 NOTE — ED Notes (Signed)
ED Provider at bedside. 

## 2018-12-19 NOTE — Discharge Instructions (Signed)
You should rest and elevate the leg over the next few days and gradually start to increase your activity.  You should take over-the-counter ibuprofen or Tylenol for pain, but you can take the prescribed Vicodin if needed for more severe pain.  Return to the ER immediately for new, worsening, or persistent severe pain, swelling, weakness or numbness, or any other new or worsening symptoms that concern you.

## 2018-12-19 NOTE — ED Provider Notes (Signed)
[facility] Emergency Department Provider Note ____________________________________________   First MD Initiated Contact with Patient 12/19/18 2141     (approximate)  I have reviewed the triage vital signs and the nursing notes.   HISTORY  Chief Complaint Leg Pain    HPI Marc Wright is a 43 y.o. male with PMH as noted below who presents with right lower leg pain, acute onset when he awoke this morning and located from the mid tibia down towards the ankle.  He describes it as a sharp pain and it is worse when he tries to bear weight.  He states that the pain initially subsided but when he tried to walk he felt it again and could not go to work.  He denies any swelling, weakness or numbness.  He has no specific injury.  However, the patient states that after being unemployed for 3 months he recently restarted working about a week ago and he has to be on his feet for his job, so he believes he might have strained something.  He has no cramping, weakness, fever, or any systemic symptoms.  Past Medical History:  Diagnosis Date  . Asthma   . CHF (congestive heart failure) ([place])   . Diabetes mellitus without complication ([place])   . OSA on CPAP   . Pneumonia 06/2016    Patient Active Problem List   Diagnosis Date Noted  . Uncontrolled type 2 diabetes mellitus with hyperglycemia ([place]) 11/26/2018  . Venous stasis dermatitis of both lower extremities 11/26/2018  . Morbid obesity with BMI of 45.0-49.9, adult ([place]) 11/26/2018  . Mild intermittent asthma without complication 61/60/7371  . Cellulitis 11/09/2018  . Lymphadenopathy 11/07/2018  . Dehydration 11/06/2018  . Hyperglycemia 11/06/2018  . Sepsis ([place]) 11/06/2018  . CAP (community acquired pneumonia) 11/06/2018  . Chronic systolic heart failure ([place]) 07/23/2016  . HTN (hypertension) 07/23/2016  . Obstructive sleep apnea on CPAP 07/23/2016  . Tobacco use 07/23/2016  . Diabetes ([place]) 07/23/2016    Past  Surgical History:  Procedure Laterality Date  . I&D EXTREMITY    . SKIN GRAFT      Prior to Admission medications   Medication Sig Start Date End Date Taking? Authorizing Provider  B Complex-C (B-COMPLEX WITH VITAMIN C) tablet Take 1 tablet by mouth daily.    [provider]  CALCIUM/MAGNESIUM/ZINC FORMULA PO Take 1 tablet by mouth daily as needed (muscle cramps).     [provider]  carvedilol (COREG) 12.5 MG tablet Take 1 tablet (12.5 mg total) by mouth 2 (two) times daily with a meal. 03/01/18   Hackney, Aura Fey, FNP  fexofenadine (ALLEGRA) 180 MG tablet Take 1 tablet (180 mg total) by mouth daily. Taking as needed Patient not taking: Reported on 11/19/2018 11/11/18   Georgette Shell, MD  furosemide (LASIX) 80 MG tablet Take 1 tablet (80 mg total) by mouth 2 (two) times daily. 03/01/18   Alisa Graff, FNP  HYDROcodone-acetaminophen (NORCO/VICODIN) 5-325 MG tablet Take 1 tablet by mouth every 6 (six) hours as needed for moderate pain. 12/19/18   Arta Silence, MD  insulin NPH-regular Human (HUMULIN 70/30) (70-30) 100 UNIT/ML injection Inject 40 Units into the skin 2 (two) times daily with a meal. 11/19/18   Charlott Rakes, MD  Insulin Syringe-Needle U-100 (INSULIN SYRINGE .5CC/31GX5/16") 31G X 5/16" 0.5 ML MISC 40 units twice a day Humulin 70/30 11/11/18   Georgette Shell, MD  lisinopril (PRINIVIL,ZESTRIL) 5 MG tablet Take 1 tablet (5 mg total)  by mouth daily. 03/01/18   Delma FreezeHackney, Tina A, FNP  multivitamin (ONE-A-DAY MEN'S) TABS tablet Take 1 tablet by mouth daily.    [provider]  niacin 250 MG tablet Take 250 mg by mouth at bedtime.    [provider]  nicotine (NICODERM CQ - DOSED IN MG/24 HOURS) 14 mg/24hr patch Place 1 patch (14 mg total) onto the skin daily. Patient not taking: Reported on 11/19/2018 11/12/18   Alwyn RenMathews, Elizabeth G, MD  torsemide (DEMADEX) 10 MG tablet Take 1 tablet (10 mg total) by mouth daily. In the morning to decrease leg  swelling; continue lasix 11/24/18   Fulp, Cammie, MD    Allergies Azithromycin  Family History  Problem Relation Age of Onset  . Congestive Heart Failure Mother   . Hypertension Mother   . Stroke Father        2017  . Asthma Father     Social History Social History   Tobacco Use  . Smoking status: Current Every Day Smoker    Packs/day: 0.75    Types: Cigarettes    Start date: 11/23/2016  . Smokeless tobacco: Never Used  Substance Use Topics  . Alcohol use: Yes    Comment: occasional   . Drug use: No    Review of Systems  Constitutional: No fever/chills.  Eyes: No redness. ENT: No neck pain. Cardiovascular: Denies chest pain. Respiratory: Denies shortness of breath. Gastrointestinal: No vomiting. Genitourinary: Negative for dysuria.  Musculoskeletal: Positive for right leg pain. Skin: Negative for rash or swelling. Neurological: Negative for focal weakness or numbness.   ____________________________________________   PHYSICAL EXAM:  VITAL SIGNS: ED Triage Vitals  Enc Vitals Group     BP 12/19/18 1509 (!) 177/116     Pulse Rate 12/19/18 1509 (!) 104     Resp 12/19/18 1509 20     Temp 12/19/18 1509 99.1 F (37.3 C)     Temp Source 12/19/18 1509 Oral     SpO2 12/19/18 1509 94 %     Weight 12/19/18 1513 (!) 390 lb (176.9 kg)     Height 12/19/18 1513 6\' 3"  (1.905 m)     Head Circumference --      Peak Flow --      Pain Score 12/19/18 1512 6     Pain Loc --      Pain Edu? --      Excl. in GC? --     Constitutional: Alert and oriented.  Relatively well appearing and in no acute distress. Eyes: Conjunctivae are normal.  Head: Atraumatic. Nose: No congestion/rhinnorhea. Mouth/Throat: Mucous membranes are moist.   Neck: Normal range of motion.  Cardiovascular: Normal rate, regular rhythm. Good peripheral circulation. Respiratory: Normal respiratory effort.   Gastrointestinal: No distention.  Musculoskeletal: No lower extremity edema.  Extremities warm  and well perfused.  Right lower leg with no significant tenderness and no palpable swelling.  2+ DP pulse.  Cap refill <2 seconds.  Full range of motion at the ankle.  Pain on dorsiflexion at the ankle. Neurologic: Intact motor and fine touch sensory to the right lower extremity. Skin:  Skin is warm and dry. No rash noted.  No erythema or induration and no abnormal warmth to the right lower leg.   Psychiatric: Mood and affect are normal. Speech and behavior are normal.  ____________________________________________   LABS (all labs ordered are listed, but only abnormal results are displayed)  Labs Reviewed - No data to display ____________________________________________  EKG   ____________________________________________  RADIOLOGY  US venous RLE: No acute DVT XR R tib/fib: No acute fracture  ____________________________________________   PROCEDURES  Procedure(s) performed: No  Procedures  Critical Care performed: No ____________________________________________   INITIAL IMPRESSION / ASSESSMENT AND PLAN / ED COURSE  Pertinent labs & imaging results that were available during my care of the patient were reviewed by me and considered in my medical decision making (see chart for details).  43 year old male with PMH as noted above presents with acute onset of right lower leg pain this morning which is worse when he bears weight or tries to dorsiflex.  He denies any specific preceding trauma although he states he recently restarted working after 3 months off and thinks he may have strained something.  He is mainly concerned about a blood clot.  I reviewed the past medical records in Epic.  The patient has a history of cellulitis in the left lower extremity and also has CHF.  He was recently evaluated for hyperkalemia as well.  On exam the patient is well-appearing.  He was slightly tachycardic at triage and hypertensive but the heart rate normalized by the time that I saw him.   He has pain on dorsiflexion but no significant tenderness, swelling, or cutaneous findings.  The right lower extremity is neuro/vascular intact.  Overall I suspect muscle or tendon strain related to overuse or other benign musculoskeletal etiology.  DVT study is negative.  I obtained an x-ray as well to rule out stress fracture or other bony abnormality, and it is negative.  There is no evidence of cellulitis.  The patient feels comfortable going home.  I gave him thorough return precautions and he expressed understanding.     ____________________________________________   FINAL CLINICAL IMPRESSION(S) / ED DIAGNOSES  Final diagnoses:  Pain in right lower leg      NEW MEDICATIONS STARTED DURING THIS VISIT:  New Prescriptions   HYDROCODONE-ACETAMINOPHEN (NORCO/VICODIN) 5-325 MG TABLET    Take 1 tablet by mouth every 6 (six) hours as needed for moderate pain.     Note:  This document was prepared using Dragon voice recognition software and may include unintentional dictation errors.    Dionne Bucy, MD 12/19/18 2300

## 2018-12-19 NOTE — ED Notes (Signed)
Patient reports woke this am with severe right lower leg pain, denies any type of injury.  Patient reports pain worse with weight bearing and described as a burning sensation.

## 2018-12-23 ENCOUNTER — Encounter (HOSPITAL_COMMUNITY): Payer: Self-pay | Admitting: Emergency Medicine

## 2018-12-23 ENCOUNTER — Emergency Department (HOSPITAL_COMMUNITY)
Admission: EM | Admit: 2018-12-23 | Discharge: 2018-12-23 | Disposition: A | Discharge status: Home / Self Care | Payer: Self-pay | Attending: Emergency Medicine | Admitting: Emergency Medicine

## 2018-12-23 ENCOUNTER — Other Ambulatory Visit: Payer: Self-pay

## 2018-12-23 DIAGNOSIS — F17210 Nicotine dependence, cigarettes, uncomplicated: Secondary | ICD-10-CM | POA: Insufficient documentation

## 2018-12-23 DIAGNOSIS — I5022 Chronic systolic (congestive) heart failure: Secondary | ICD-10-CM | POA: Insufficient documentation

## 2018-12-23 DIAGNOSIS — E119 Type 2 diabetes mellitus without complications: Secondary | ICD-10-CM | POA: Insufficient documentation

## 2018-12-23 DIAGNOSIS — Z794 Long term (current) use of insulin: Secondary | ICD-10-CM | POA: Insufficient documentation

## 2018-12-23 DIAGNOSIS — J45909 Unspecified asthma, uncomplicated: Secondary | ICD-10-CM | POA: Insufficient documentation

## 2018-12-23 DIAGNOSIS — M79604 Pain in right leg: Secondary | ICD-10-CM | POA: Insufficient documentation

## 2018-12-23 DIAGNOSIS — Z79899 Other long term (current) drug therapy: Secondary | ICD-10-CM | POA: Insufficient documentation

## 2018-12-23 DIAGNOSIS — M791 Myalgia, unspecified site: Secondary | ICD-10-CM

## 2018-12-23 DIAGNOSIS — I110 Hypertensive heart disease with heart failure: Secondary | ICD-10-CM | POA: Insufficient documentation

## 2018-12-23 DIAGNOSIS — Z881 Allergy status to other antibiotic agents status: Secondary | ICD-10-CM | POA: Insufficient documentation

## 2018-12-23 LAB — CBC WITH DIFFERENTIAL/PLATELET
Abs Immature Granulocytes: 0.04 10*3/uL (ref 0.00–0.07)
Basophils Absolute: 0.1 10*3/uL (ref 0.0–0.1)
Basophils Relative: 1 %
Eosinophils Absolute: 0.4 10*3/uL (ref 0.0–0.5)
Eosinophils Relative: 7 %
HCT: 47.3 % (ref 39.0–52.0)
Hemoglobin: 15.5 g/dL (ref 13.0–17.0)
Immature Granulocytes: 1 %
Lymphocytes Relative: 27 %
Lymphs Abs: 1.7 10*3/uL (ref 0.7–4.0)
MCH: 28.7 pg (ref 26.0–34.0)
MCHC: 32.8 g/dL (ref 30.0–36.0)
MCV: 87.6 fL (ref 80.0–100.0)
Monocytes Absolute: 0.4 10*3/uL (ref 0.1–1.0)
Monocytes Relative: 7 %
Neutro Abs: 3.6 10*3/uL (ref 1.7–7.7)
Neutrophils Relative %: 57 %
Platelets: 277 10*3/uL (ref 150–400)
RBC: 5.4 MIL/uL (ref 4.22–5.81)
RDW: 14.6 % (ref 11.5–15.5)
WBC: 6.2 10*3/uL (ref 4.0–10.5)
nRBC: 0 % (ref 0.0–0.2)

## 2018-12-23 LAB — COMPREHENSIVE METABOLIC PANEL
ALT: 24 U/L (ref 0–44)
AST: 24 U/L (ref 15–41)
Albumin: 3.9 g/dL (ref 3.5–5.0)
Alkaline Phosphatase: 82 U/L (ref 38–126)
Anion gap: 8 (ref 5–15)
BUN: 13 mg/dL (ref 6–20)
CO2: 24 mmol/L (ref 22–32)
Calcium: 9.2 mg/dL (ref 8.9–10.3)
Chloride: 103 mmol/L (ref 98–111)
Creatinine, Ser: 0.73 mg/dL (ref 0.61–1.24)
GFR calc Af Amer: >60 mL/min (ref >60)
GFR calc non Af Amer: >60 mL/min (ref >60)
Glucose, Bld: 235 mg/dL — ABNORMAL HIGH (ref 70–99)
Potassium: 4.4 mmol/L (ref 3.5–5.1)
Sodium: 135 mmol/L (ref 135–145)
Total Bilirubin: 0.8 mg/dL (ref 0.3–1.2)
Total Protein: 7 g/dL (ref 6.5–8.1)

## 2018-12-23 MED ORDER — DICLOFENAC SODIUM 75 MG PO TBEC: 75.0000 mg | DELAYED_RELEASE_TABLET | Freq: Two times a day (BID) | ORAL | 0 refills | Status: DC

## 2018-12-23 MED ORDER — HYDROCODONE-ACETAMINOPHEN 5-325 MG PO TABS
2.0000 | ORAL_TABLET | Freq: Once | ORAL | Status: AC
  Administered 2018-12-23: 2 via ORAL
  Filled 2018-12-23: qty 2

## 2018-12-23 MED ORDER — OXYCODONE-ACETAMINOPHEN 5-325 MG PO TABS: 1.0000 | ORAL_TABLET | ORAL | 0 refills | Status: DC | PRN

## 2018-12-23 MED ORDER — METHOCARBAMOL 500 MG PO TABS: 500.0000 mg | ORAL_TABLET | Freq: Two times a day (BID) | ORAL | 0 refills | Status: DC

## 2018-12-23 MED FILL — DICLOFENAC SOD EC 75 MG TAB: 75 | 10 days supply | Qty: 20 | Fill #0

## 2018-12-23 MED FILL — METHOCARBAMOL 500 MG TABS: 500 | 10 days supply | Qty: 20 | Fill #0

## 2018-12-23 NOTE — ED Notes (Signed)
Ortho Paged.  

## 2018-12-23 NOTE — ED Triage Notes (Signed)
Pt complains of right lower leg pain. Pt went to [facility] 5 days ago due to the severe pain. Pt states they ruled out any fx and dvt. Pt complains of ongoing pain despite use of pain medication.

## 2018-12-23 NOTE — ED Provider Notes (Signed)
[place] EMERGENCY DEPARTMENT Provider Note   CSN: [id number] Arrival date & time: 12/23/18  1102     History   Chief Complaint Chief Complaint  Patient presents with  . Leg Pain    HPI Marc Wright is a 43 y.o. male.     The history is provided by the patient. No language interpreter was used.  Leg Pain Location:  Leg Injury: no   Leg location:  R leg Pain details:    Quality:  Aching and cramping   Radiates to:  Does not radiate   Severity:  Severe   Onset quality:  Gradual   Timing:  Constant   Progression:  Worsening Chronicity:  New Dislocation: no   Foreign body present:  No foreign bodies Prior injury to area:  No Relieved by:  Nothing Worsened by:  Nothing Ineffective treatments:  None tried Associated symptoms: no back pain and no fever   Risk factors: no recent illness     Past Medical History:  Diagnosis Date  . Asthma   . CHF (congestive heart failure) ([place])   . Diabetes mellitus without complication ([place])   . OSA on CPAP   . Pneumonia 06/2016    Patient Active Problem List   Diagnosis Date Noted  . Uncontrolled type 2 diabetes mellitus with hyperglycemia ([place]) 11/26/2018  . Venous stasis dermatitis of both lower extremities 11/26/2018  . Morbid obesity with BMI of 45.0-49.9, adult ([place]) 11/26/2018  . Mild intermittent asthma without complication 49/67/5916  . Cellulitis 11/09/2018  . Lymphadenopathy 11/07/2018  . Dehydration 11/06/2018  . Hyperglycemia 11/06/2018  . Sepsis ([employer]) 11/06/2018  . CAP (community acquired pneumonia) 11/06/2018  . Chronic systolic heart failure ([place]) 07/23/2016  . HTN (hypertension) 07/23/2016  . Obstructive sleep apnea on CPAP 07/23/2016  . Tobacco use 07/23/2016  . Diabetes ([place]) 07/23/2016    Past Surgical History:  Procedure Laterality Date  . I&D EXTREMITY    . SKIN GRAFT          Home Medications    Prior to Admission medications   Medication Sig Start Date End Date  Taking? Authorizing Provider  B Complex-C (B-COMPLEX WITH VITAMIN C) tablet Take 1 tablet by mouth daily.    [provider]  CALCIUM/MAGNESIUM/ZINC FORMULA PO Take 1 tablet by mouth daily as needed (muscle cramps).     [provider]  carvedilol (COREG) 12.5 MG tablet Take 1 tablet (12.5 mg total) by mouth 2 (two) times daily with a meal. 03/01/18   Alisa Graff, FNP  diclofenac (VOLTAREN) 75 MG EC tablet Take 1 tablet (75 mg total) by mouth 2 (two) times daily. 12/23/18   Fransico Meadow, PA-C  fexofenadine (ALLEGRA) 180 MG tablet Take 1 tablet (180 mg total) by mouth daily. Taking as needed Patient not taking: Reported on 11/19/2018 11/11/18   Georgette Shell, MD  furosemide (LASIX) 80 MG tablet Take 1 tablet (80 mg total) by mouth 2 (two) times daily. 03/01/18   Alisa Graff, FNP  HYDROcodone-acetaminophen (NORCO/VICODIN) 5-325 MG tablet Take 1 tablet by mouth every 6 (six) hours as needed for moderate pain. 12/19/18   Arta Silence, MD  insulin NPH-regular Human (HUMULIN 70/30) (70-30) 100 UNIT/ML injection Inject 40 Units into the skin 2 (two) times daily with a meal. 11/19/18   Charlott Rakes, MD  Insulin Syringe-Needle U-100 (INSULIN SYRINGE .5CC/31GX5/16") 31G X 5/16" 0.5 ML MISC 40 units twice a day Humulin 70/30 11/11/18   Landis Gandy  G, MD  lisinopril (PRINIVIL,ZESTRIL) 5 MG tablet Take 1 tablet (5 mg total) by mouth daily. 03/01/18   Delma Freeze, FNP  methocarbamol (ROBAXIN) 500 MG tablet Take 1 tablet (500 mg total) by mouth 2 (two) times daily. 12/23/18   Elson Areas, PA-C  methocarbamol (ROBAXIN) 500 MG tablet Take 1 tablet (500 mg total) by mouth 2 (two) times daily. 12/23/18   Elson Areas, PA-C  multivitamin (ONE-A-DAY MEN'S) TABS tablet Take 1 tablet by mouth daily.    [provider]  niacin 250 MG tablet Take 250 mg by mouth at bedtime.    [provider]  nicotine (NICODERM CQ - DOSED IN MG/24 HOURS) 14 mg/24hr patch Place  1 patch (14 mg total) onto the skin daily. Patient not taking: Reported on 11/19/2018 11/12/18   Alwyn Ren, MD  oxyCODONE-acetaminophen (PERCOCET) 5-325 MG tablet Take 1 tablet by mouth every 4 (four) hours as needed for severe pain. 12/23/18 12/23/19  Elson Areas, PA-C  torsemide (DEMADEX) 10 MG tablet Take 1 tablet (10 mg total) by mouth daily. In the morning to decrease leg swelling; continue lasix 11/24/18   Cain Saupe, MD    Family History Family History  Problem Relation Age of Onset  . Congestive Heart Failure Mother   . Hypertension Mother   . Stroke Father        2017  . Asthma Father     Social History Social History   Tobacco Use  . Smoking status: Current Every Day Smoker    Packs/day: 0.75    Types: Cigarettes    Start date: 11/23/2016  . Smokeless tobacco: Never Used  Substance Use Topics  . Alcohol use: Yes    Comment: occasional   . Drug use: No     Allergies   Azithromycin   Review of Systems Review of Systems  Constitutional: Negative for fever.  Musculoskeletal: Negative for back pain.  All other systems reviewed and are negative.    Physical Exam Updated Vital Signs BP (!) 158/85 (BP Location: Right Arm)   Pulse 88   Temp 98.5 F (36.9 C) (Oral)   Resp 18   Ht 6\' 3"  (1.905 m)   Wt (!) 176.9 kg   SpO2 93%   BMI 48.75 kg/m   Physical Exam Vitals signs and nursing note reviewed.  Constitutional:      Appearance: He is well-developed.  HENT:     Head: Normocephalic and atraumatic.     Mouth/Throat:     Mouth: Mucous membranes are moist.  Eyes:     Conjunctiva/sclera: Conjunctivae normal.  Neck:     Musculoskeletal: Neck supple.  Cardiovascular:     Rate and Rhythm: Normal rate and regular rhythm.     Heart sounds: No murmur.  Pulmonary:     Effort: Pulmonary effort is normal. No respiratory distress.     Breath sounds: Normal breath sounds.  Abdominal:     Palpations: Abdomen is soft.     Tenderness: There is no  abdominal tenderness.  Musculoskeletal:        General: No swelling or tenderness.     Comments: Negative homan's  No swelling  nv nad ns intact  Skin:    General: Skin is warm and dry.  Neurological:     General: No focal deficit present.     Mental Status: He is alert.  Psychiatric:        Mood and Affect: Mood normal.  Pt was seen at [facility] on 6/28 and had a normal doppler and negative xray.   I do not think pt needs repeat doppler.  I doubt dvt.  I suspect pain is muscular.   ED Treatments / Results  Labs (all labs ordered are listed, but only abnormal results are displayed) Labs Reviewed  COMPREHENSIVE METABOLIC PANEL - Abnormal; Notable for the following components:      Result Value   Glucose, Bld 235 (*)    All other components within normal limits  CBC WITH DIFFERENTIAL/PLATELET    EKG None  Radiology No results found.  Procedures Procedures (including critical care time)  Medications Ordered in ED Medications  HYDROcodone-acetaminophen (NORCO/VICODIN) 5-325 MG per tablet 2 tablet (2 tablets Oral Given 12/23/18 1329)     Initial Impression / Assessment and Plan / ED Course  I have reviewed the triage vital signs and the nursing notes.  Pertinent labs & imaging results that were available during my care of the patient were reviewed by me and considered in my medical decision making (see chart for details).          Final Clinical Impressions(s) / ED Diagnoses   Final diagnoses:  Muscle pain    ED Discharge Orders         Ordered    oxyCODONE-acetaminophen (PERCOCET) 5-325 MG tablet  Every 4 hours PRN,   Status:  Discontinued     12/23/18 1455    diclofenac (VOLTAREN) 75 MG EC tablet  2 times daily,   Status:  Discontinued     12/23/18 1455    methocarbamol (ROBAXIN) 500 MG tablet  2 times daily     12/23/18 1455    diclofenac (VOLTAREN) 75 MG EC tablet  2 times daily     12/23/18 1516    oxyCODONE-acetaminophen (PERCOCET) 5-325  MG tablet  Every 4 hours PRN,   Status:  Discontinued     12/23/18 1516    methocarbamol (ROBAXIN) 500 MG tablet  2 times daily     12/23/18 1516    oxyCODONE-acetaminophen (PERCOCET) 5-325 MG tablet  Every 4 hours PRN,   Status:  Discontinued     12/23/18 1518    oxyCODONE-acetaminophen (PERCOCET) 5-325 MG tablet  Every 4 hours PRN     12/23/18 1519        An After Visit Summary was printed and given to the patient.   Elson AreasSofia, Leslie K, [place] JerseyPA-C 12/23/18 1640    Arby BarrettePfeiffer, Marcy, MD 12/27/18 [phone]

## 2018-12-23 NOTE — ED Notes (Signed)
Pt verbalized understanding of discharge and follow up instructions. IV removed and bleeding controlled. Pt wheeled out by staff.

## 2018-12-23 NOTE — Discharge Instructions (Signed)
See the Orthopaedist for evaluation  

## 2019-01-02 IMAGING — CT CT ANGIO CHEST
2 of 6 series · 18 of 46 positions shown · IV contrast (APPLIED)
Comparison: Radiography same day.  CT 03/06/2016.

CLINICAL DATA: Cough and shortness of breath over the last 2 days.
Pulmonary edema.

EXAM:
CT ANGIOGRAPHY CHEST WITH CONTRAST
TECHNIQUE: Multidetector CT imaging of the chest was performed using the
standard protocol during bolus administration of intravenous
contrast. Multiplanar CT image reconstructions and MIPs were
obtained to evaluate the vascular anatomy.
CONTRAST:  100 cc Isovue 370

[Series 6: thins · axial · 0.93mm/px · z∈[-651,-341]mm · 16 of 340 slices shown]
[im 15/340  lung]
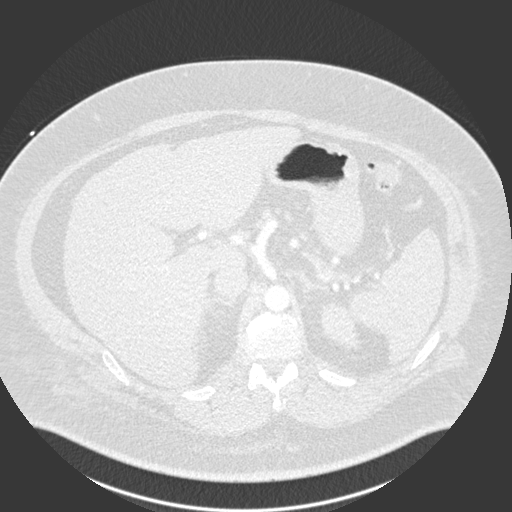
[im 45/340  soft-tissue]
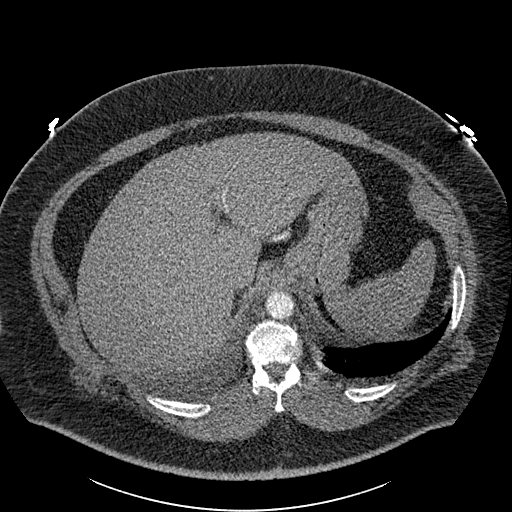
[im 59/340  lung]
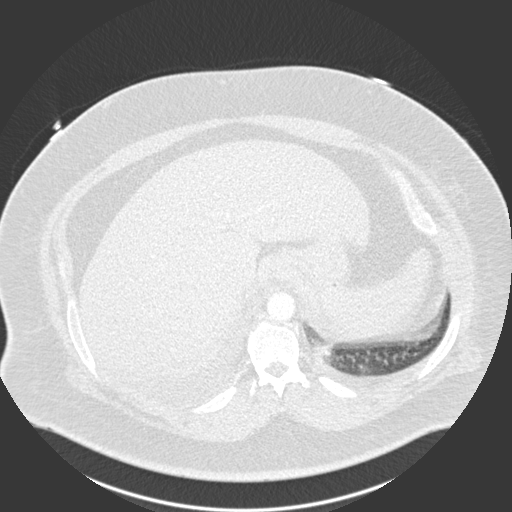
[im 74/340  soft-tissue]
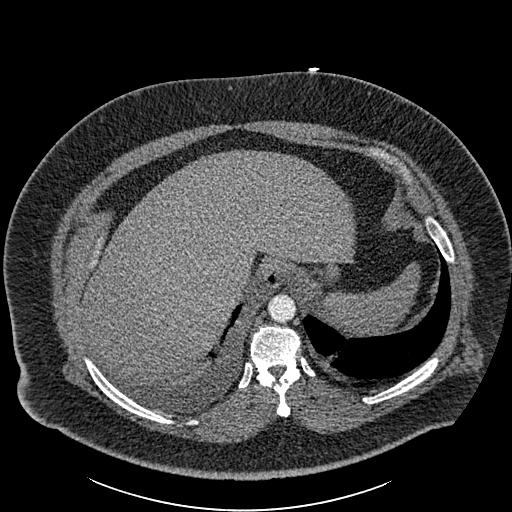
[im 104/340  lung]
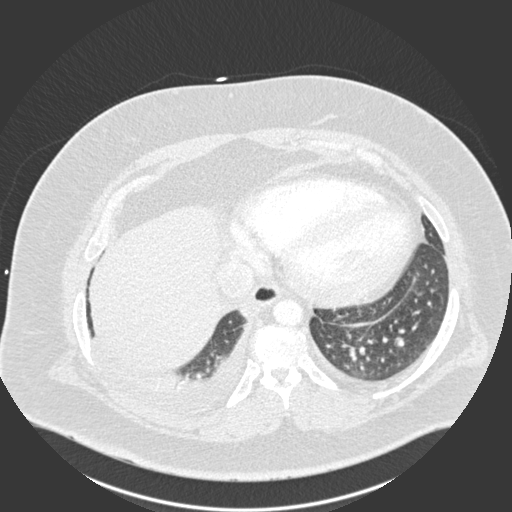
[im 118/340  soft-tissue]
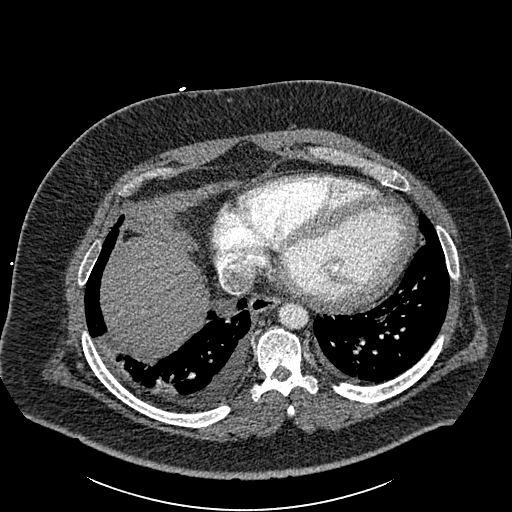
[im 133/340  lung]
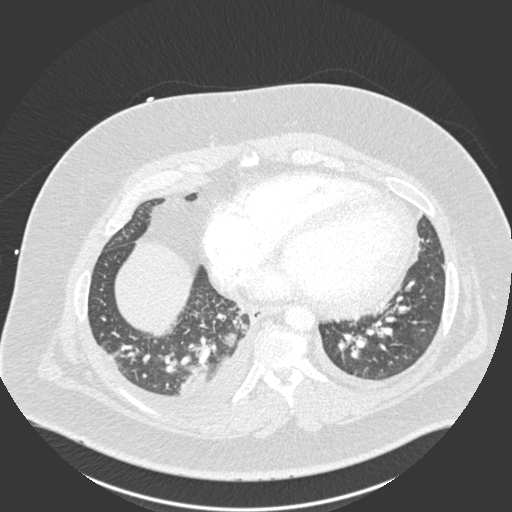
[im 163/340  soft-tissue]
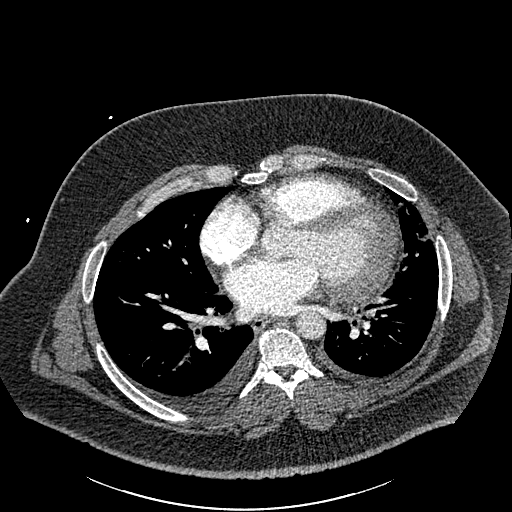
[im 177/340  lung]
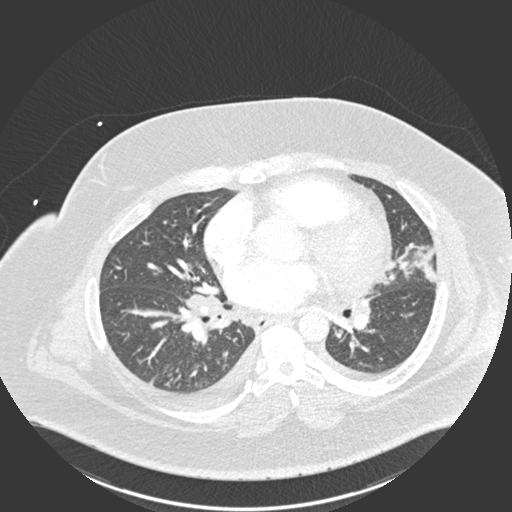
[im 207/340  soft-tissue]
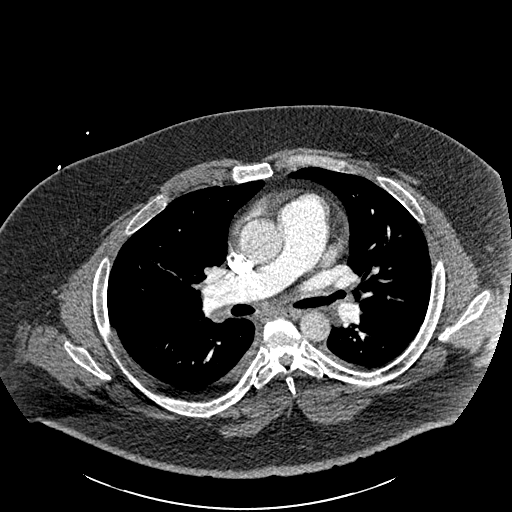
[im 222/340  lung]
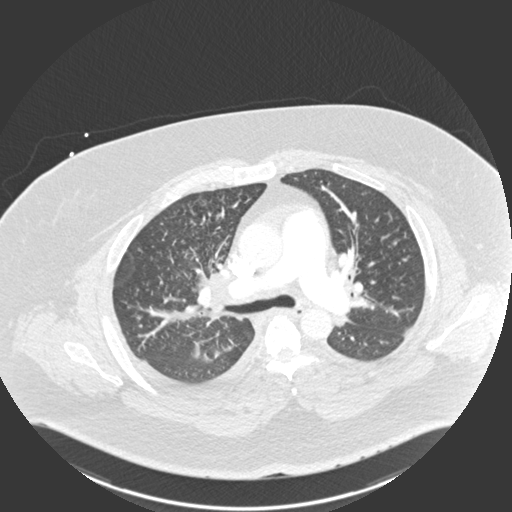
[im 236/340  soft-tissue]
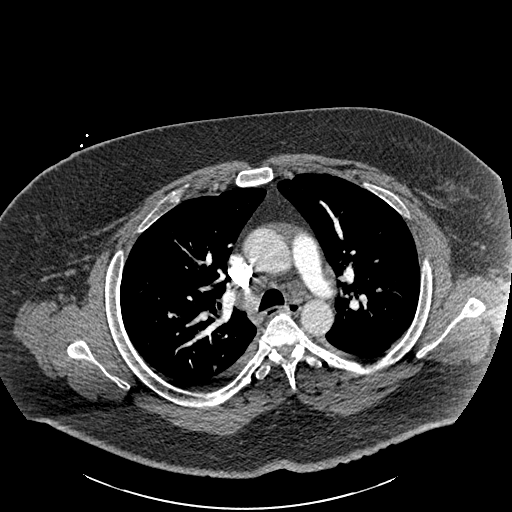
[im 266/340  lung]
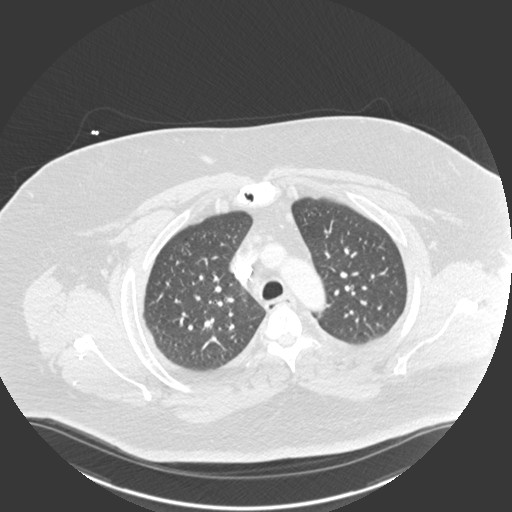
[im 281/340  soft-tissue]
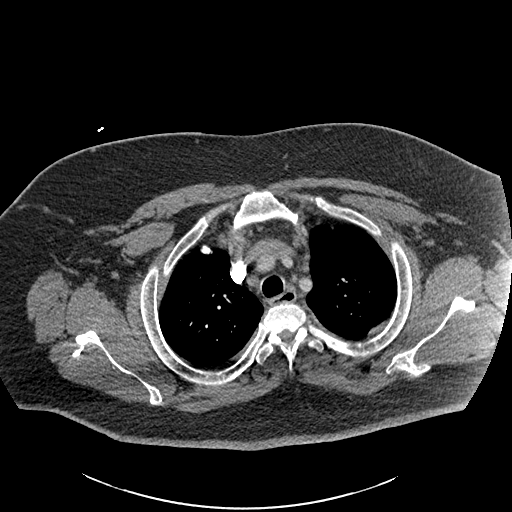
[im 295/340  lung]
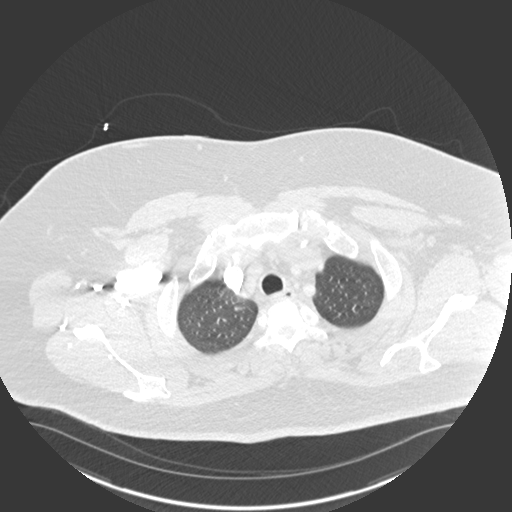
[im 325/340  soft-tissue]
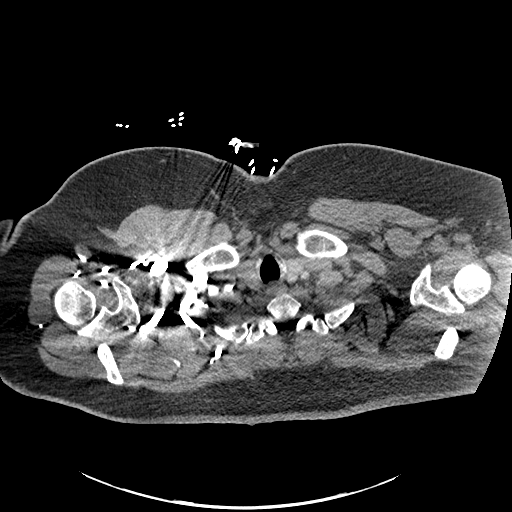

[Series 8: coronal mpr · coronal · 0.67mm/px · 2 of 111 slices shown]
[im 37/111  soft-tissue]
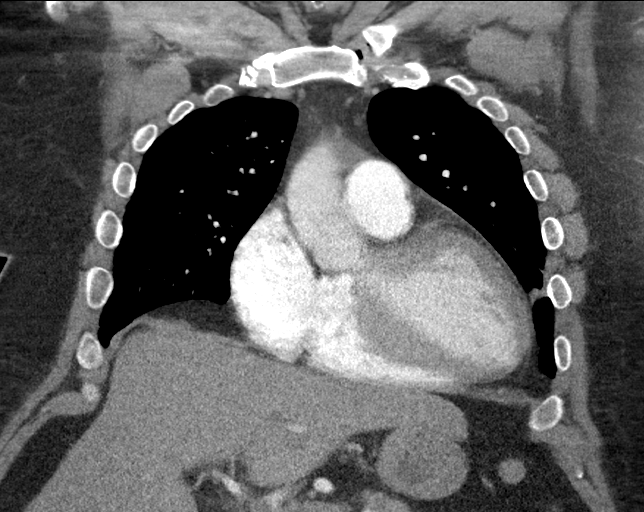
[im 74/111  soft-tissue]
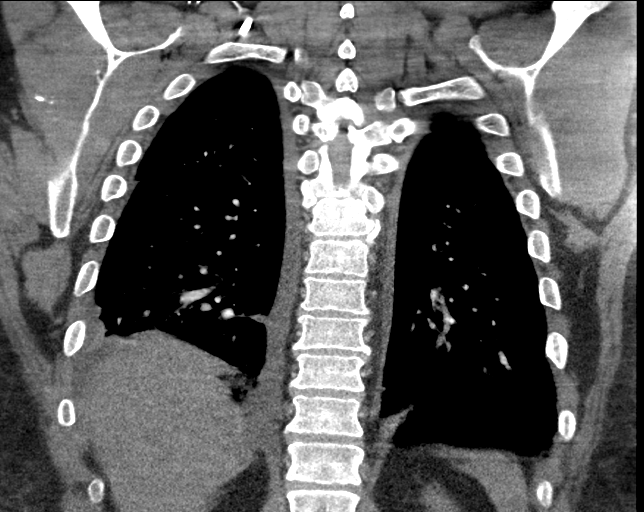

[18 of 46 positions shown; findings below may reference images not displayed]

FINDINGS: Cardiovascular: Pulmonary arterial opacification is good. There are
no pulmonary emboli. No aortic atherosclerosis is seen. Coronary
artery calcification is noted in the left system, premature for age.
No pericardial fluid.

Mediastinum/Nodes: Mildly prominent hilar and mediastinal nodes,
probably reactive.

Lungs/Pleura: Interstitial prominence that could go along with
edema. There are small areas of airspace filling in both lungs that
are most consistent with widespread bronchopneumonia. These could be
areas of airspace filling secondary to edema. There are small
effusions layering dependently, right larger than left.

Upper Abdomen: Normal

Musculoskeletal: No significant finding.

Review of the MIP images confirms the above findings.
IMPRESSION: No pulmonary emboli.

Interstitial edema pattern. Small effusions layering dependently
right larger than left.

Patchy areas of airspace filling bilaterally most consistent with
bronchopneumonia. The differential diagnosis would be early alveolar
edema, but pneumonia is favored.

## 2019-01-02 IMAGING — CR DG CHEST 2V
1 series · 2 of 2 positions shown · non-contrast
Comparison: Chest x-ray of June 23, 2014

CLINICAL DATA: Shortness of breath, chest congestion cough, history
of asthma. Current smoker.

EXAM:
CHEST  2 VIEW

[Series 1: dg chest 2 view · 0.14mm/px · 2 of 2 slices shown]
[im 1/2]
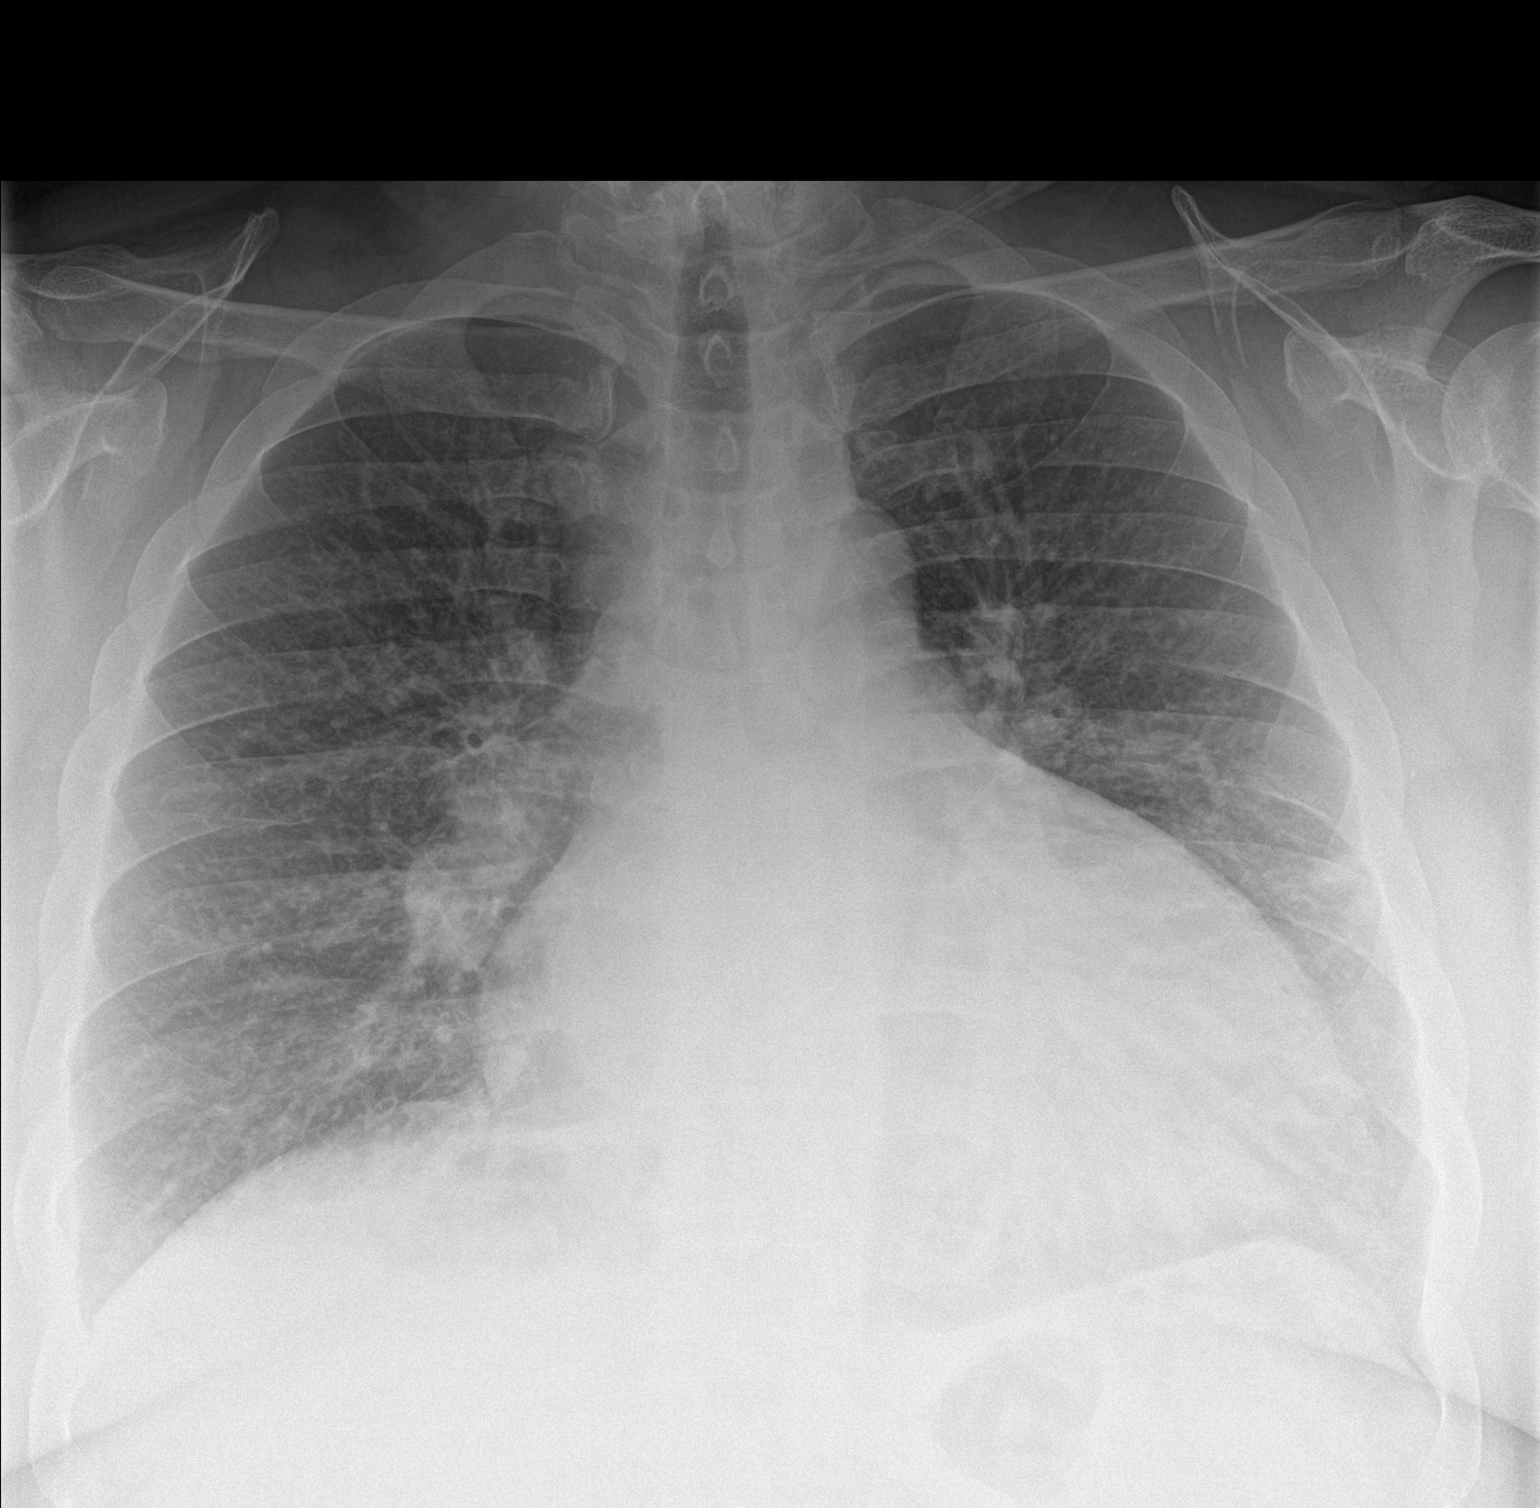
[im 2/2]
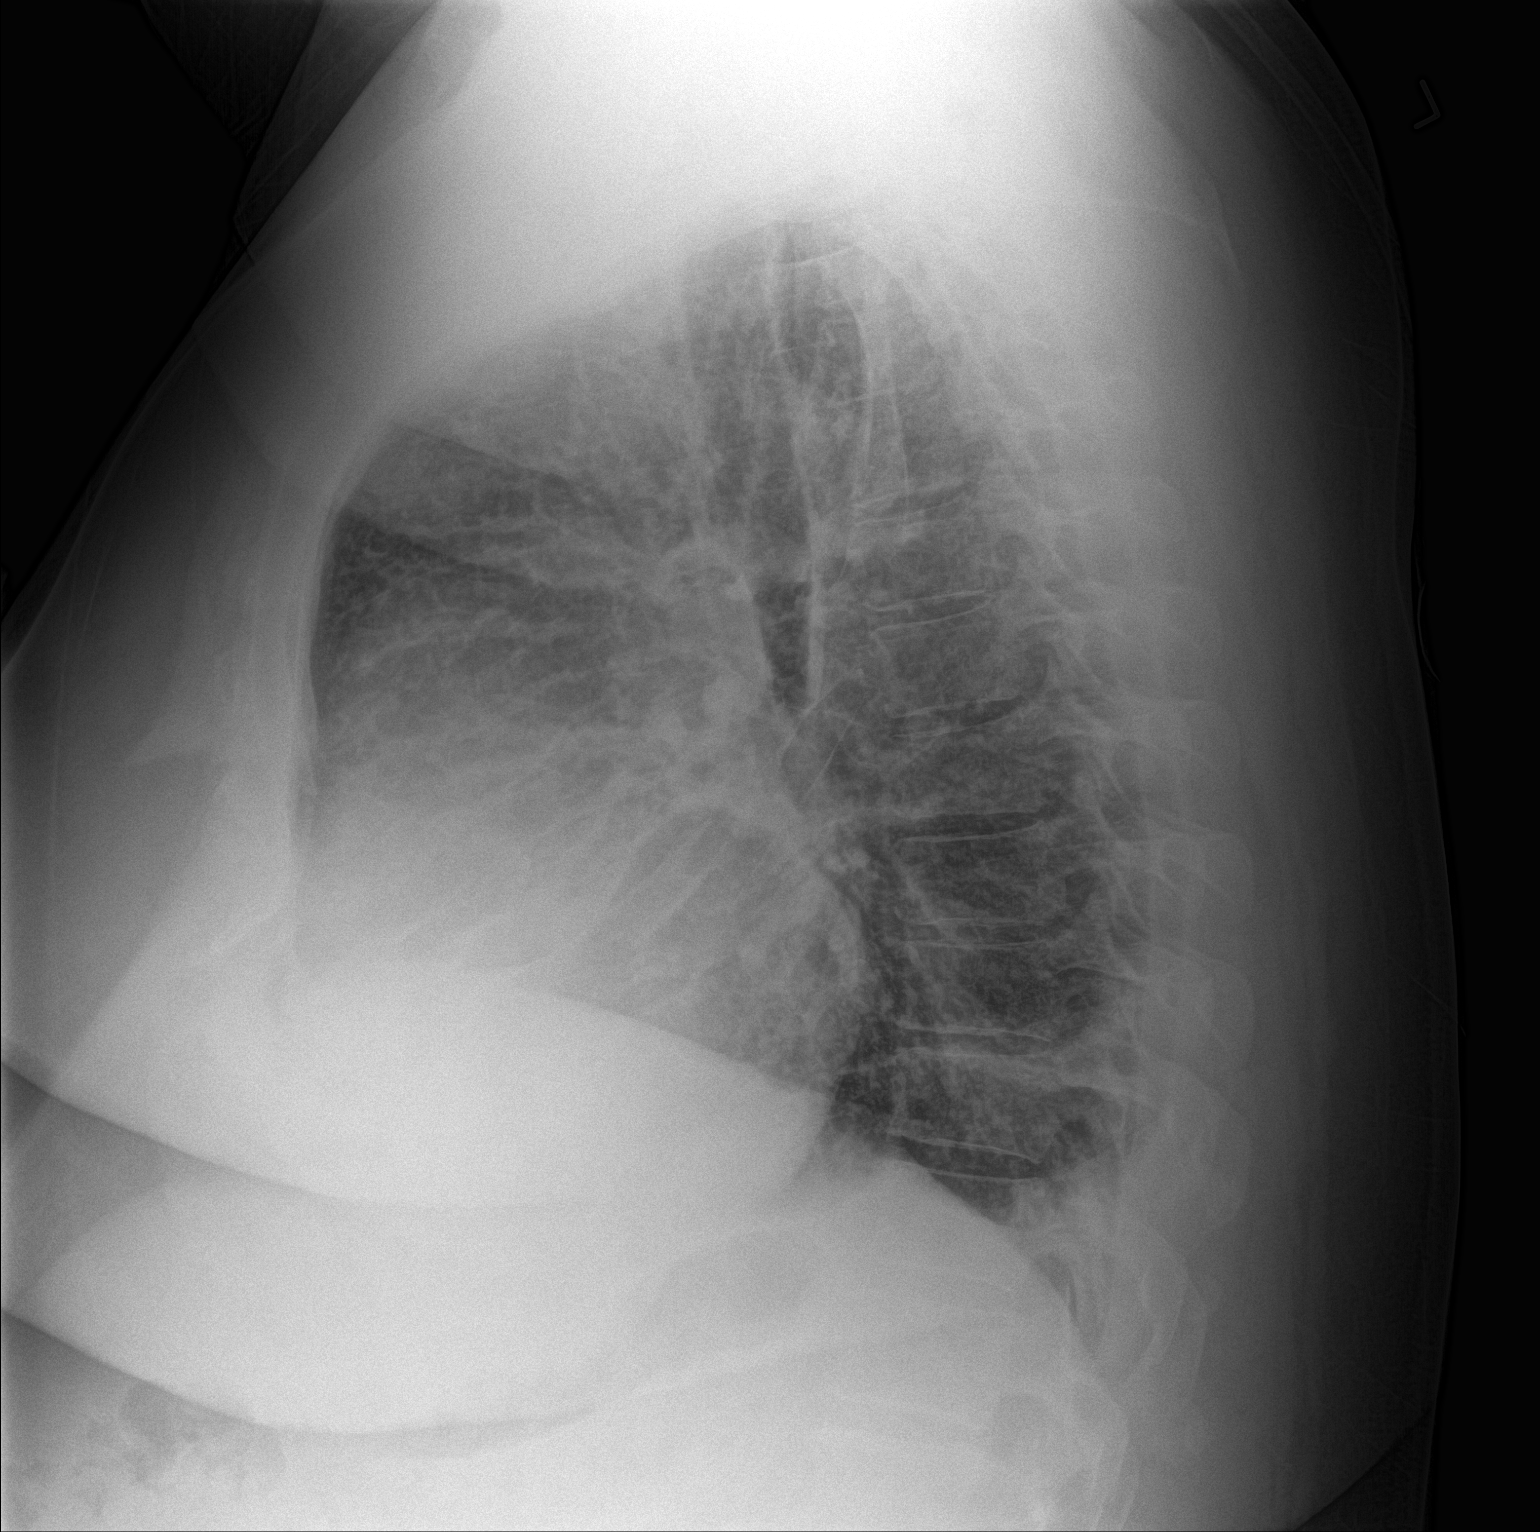

[2 of 2 positions shown; findings below may reference images not displayed]

FINDINGS: The lungs are well-expanded. The interstitial markings remain
increased bilaterally but have improved slightly since yesterday's
study. The cardiac silhouette remains enlarged. The pulmonary
vascularity is less engorged today. The trachea is midline. There is
no pleural effusion.
IMPRESSION: Mild interval improvement in the appearance of the pulmonary
interstitium consistent with resolving CHF superimposed upon
underlying reactive airway disease.

## 2019-01-03 IMAGING — CR DG CHEST 2V
1 series · 3 of 3 positions shown · non-contrast
Comparison: CT chest [DATE][REDACTED] 9344

CLINICAL DATA: Acute CHF.

EXAM:
CHEST  2 VIEW

[Series 1: w chest pa · 0.14mm/px · 3 of 3 slices shown]
[im 1/3]
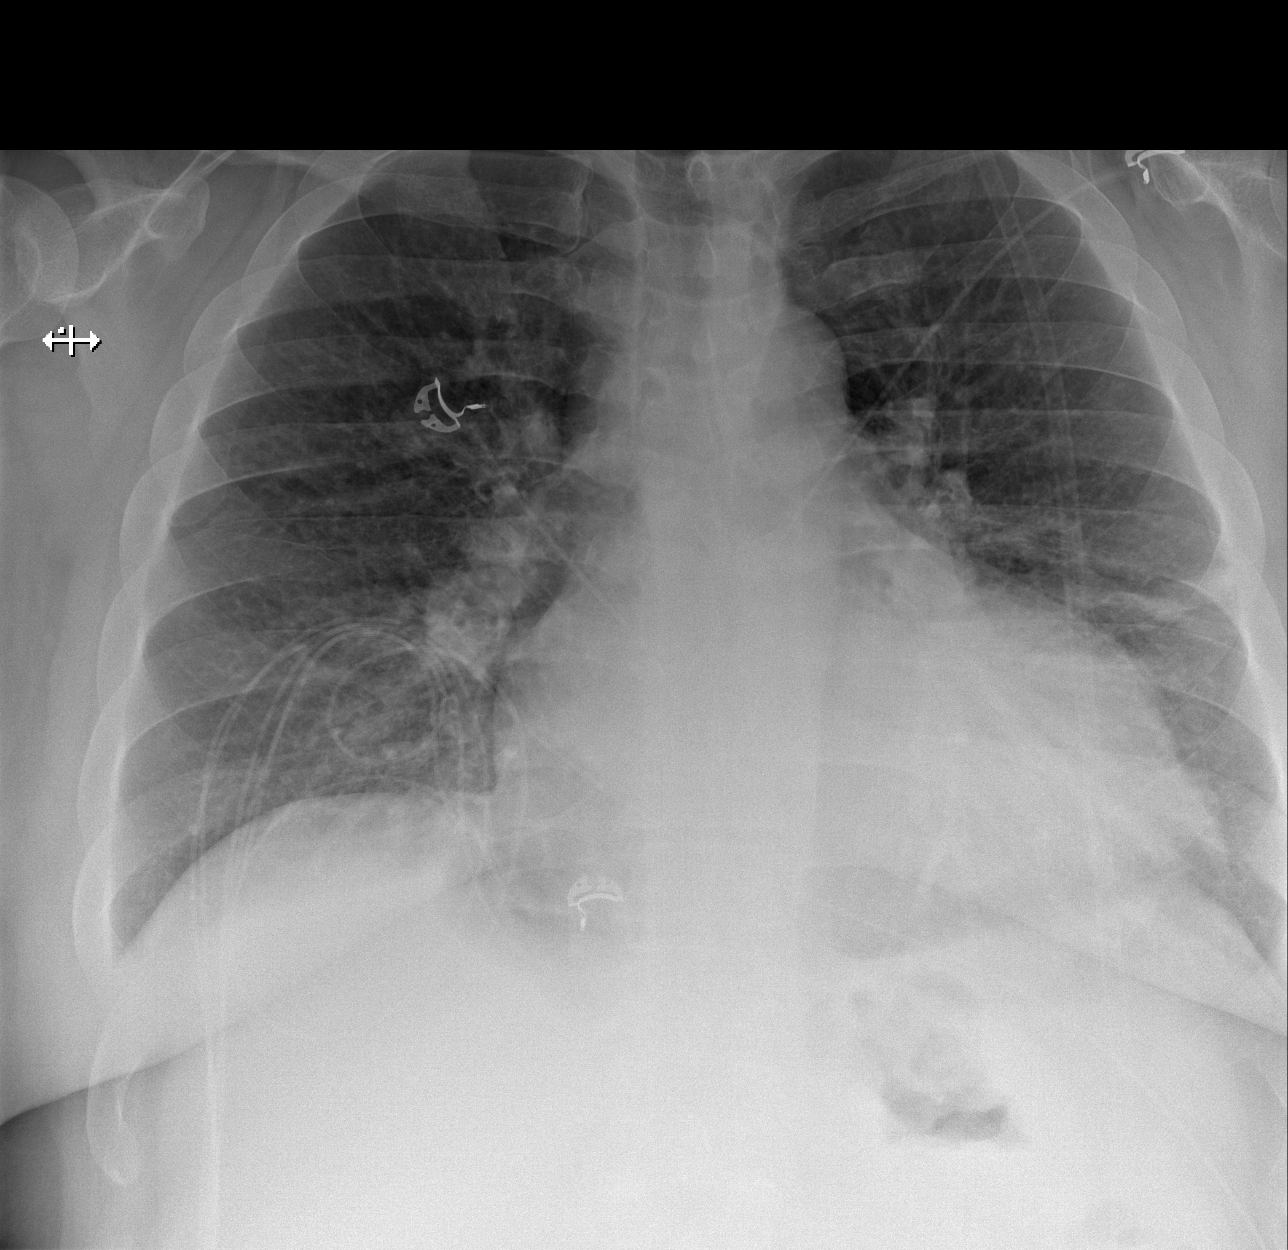
[im 2/3]
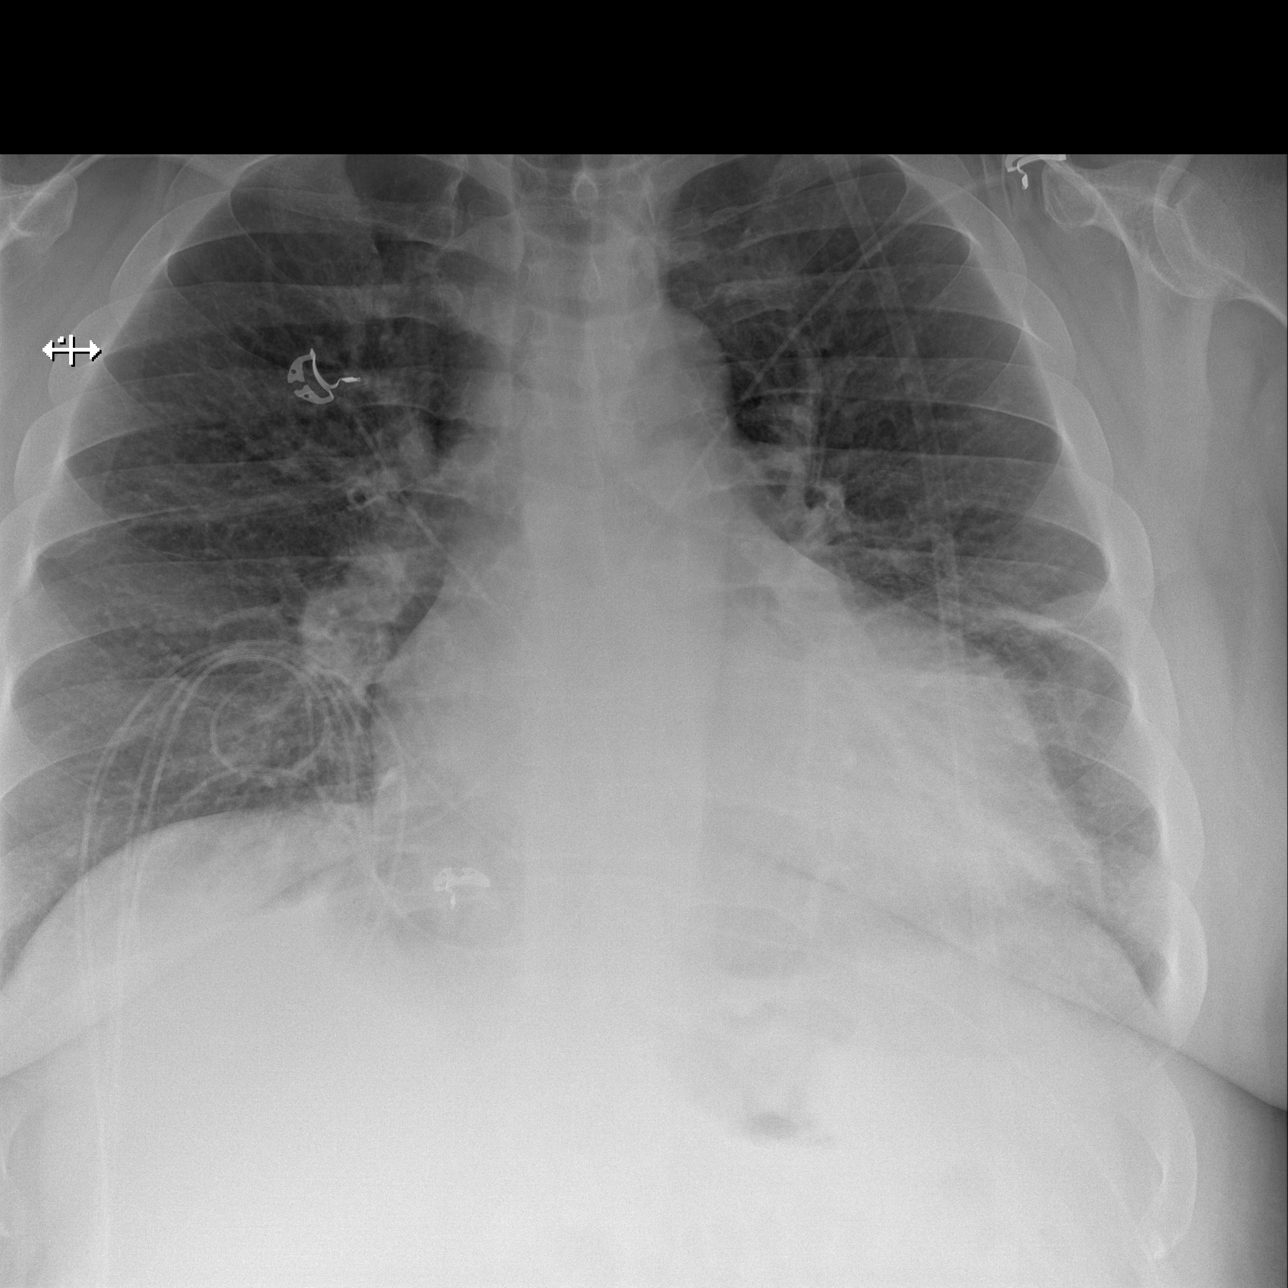
[im 3/3]
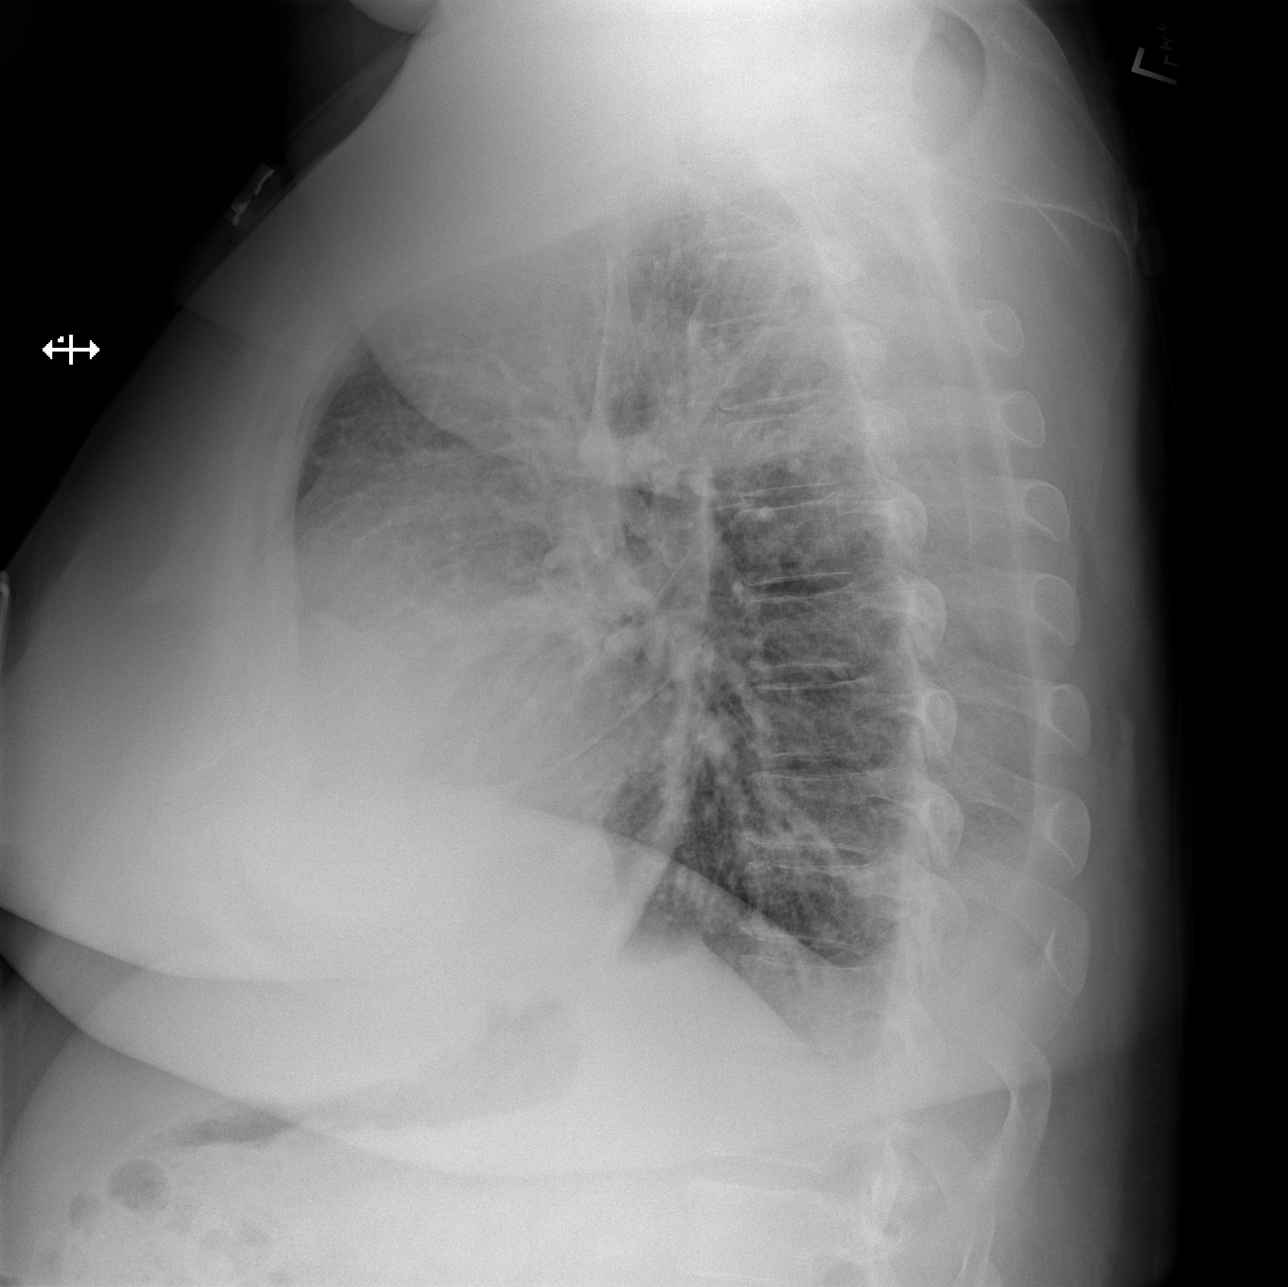

[3 of 3 positions shown; findings below may reference images not displayed]

FINDINGS: Cardiac silhouette is moderately enlarged unchanged. Pulmonary
vascular congestion mild interstitial prominence. Bandlike density
LEFT midlung zone. Small pleural effusions. No pneumothorax. Large
body habitus. Osseous structures are nonsuspicious.
IMPRESSION: Stable cardiomegaly and findings of CHF with small pleural
effusions. LEFT midlung zone atelectasis.

## 2019-02-04 NOTE — Progress Notes (Deleted)
Subjective:    Patient ID: Marc Wright, male    DOB: Sep 22, 1975, 43 y.o.   MRN: [id number]  HPI  Mr Szwed is a 43 y/o male with a history of obstructive sleep apnea (with CPAP), DM, asthma, obesity, tobacco use and chronic heart failure.   Last echo was done 06/26/16 and showed an EF of 25-30% with mild MR/TR.   Was in the ED 12/23/2018 due to muscle pain where he was treated and released. Was in the ED 12/19/2018 due to right lower leg pain. Ruled out DVT and he was released. Admitted 11/06/2018 due to chest pain, left arm pain shortness of breath and dry cough with nausea. COVID test negative. CT chest showed bilateral multifocal pneumonia, negative for PE. Antibiotics given for leg cellulitis and he was released.      He presents today for his follow-up visit with a chief complaint of   Past Medical History:  Diagnosis Date  . Asthma   . CHF (congestive heart failure) (HCC)   . Diabetes mellitus without complication (HCC)   . OSA on CPAP   . Pneumonia 06/2016   Past Surgical History:  Procedure Laterality Date  . I&D EXTREMITY    . SKIN GRAFT     Family History  Problem Relation Age of Onset  . Congestive Heart Failure Mother   . Hypertension Mother   . Stroke Father        2017  . Asthma Father    Social History   Tobacco Use  . Smoking status: Current Every Day Smoker    Packs/day: 0.75    Types: Cigarettes    Start date: 11/23/2016  . Smokeless tobacco: Never Used  Substance Use Topics  . Alcohol use: Yes    Comment: occasional    Allergies  Allergen Reactions  . Azithromycin Itching and Rash    Burning rash with intravenous dose      Review of Systems  Constitutional: Positive for fatigue. Negative for appetite change.  HENT: Positive for congestion. Negative for postnasal drip and sore throat.   Eyes: Negative.   Respiratory: Negative for cough, chest tightness and shortness of breath.   Cardiovascular: Negative for chest pain, palpitations and leg  swelling.  Gastrointestinal: Negative for abdominal distention and abdominal pain.  Endocrine: Negative.   Genitourinary: Negative.   Musculoskeletal: Positive for arthralgias (right knee/ankle pain in the mornings). Negative for back pain and neck pain.  Skin: Negative.   Allergic/Immunologic: Negative.   Neurological: Negative for dizziness and light-headedness.  Hematological: Negative for adenopathy. Does not bruise/bleed easily.  Psychiatric/Behavioral: Negative for dysphoric mood, sleep disturbance (wearing CPAP nightly) and suicidal ideas. The patient is not nervous/anxious.         Objective:   Physical Exam  Constitutional: He is oriented to person, place, and time. He appears well-developed and well-nourished.  HENT:  Head: Normocephalic and atraumatic.  Neck: Normal range of motion. Neck supple. No JVD present.  Cardiovascular: Normal rate and regular rhythm.  Pulmonary/Chest: Effort normal. He has no wheezes. He has no rales.  Abdominal: Soft. He exhibits no distension. There is no abdominal tenderness.  Musculoskeletal:        General: No tenderness or edema.  Neurological: He is alert and oriented to person, place, and time.  Skin: Skin is warm and dry.  Psychiatric: He has a normal mood and affect. His behavior is normal. Thought content normal.  Nursing note and vitals reviewed.     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  - not adding salt to his food at home but does cook with it at work and, again, he's sampling the food every time he is at work cooking. Importance of closely following a 2000mg   sodium diet was reviewed with him.  - tolerating lisinopril 5mg  daily; fatigue worsened when entresto tried - discuss titrating up lisinopril, carvedilol and then adding farxiga - BNP 11/06/2018 was 46.9  2: HTN- - BP  - BMP from 12/23/2018 reviewed and showed sodium 135, potassium 4.4, creatinine 0.73 and GFR >60   3: Obstructive sleep apnea- - wearing CPAP nightly and feels like he's sleeping well  4: Diabetes- - glucose a couple of days ago was  - A1c on 06/25/16 was 7.3%  Medication bottles were reviewed.

## 2019-02-07 ENCOUNTER — Ambulatory Visit: Payer: Self-pay | Admitting: Family

## 2019-02-07 ENCOUNTER — Telehealth: Payer: Self-pay | Admitting: Family

## 2019-02-07 NOTE — Telephone Encounter (Signed)
Patient did not show for his [facility] appointment on 02/07/2019. Will attempt to reschedule.

## 2019-02-21 ENCOUNTER — Other Ambulatory Visit: Payer: Self-pay | Admitting: Family

## 2019-02-21 DIAGNOSIS — I5022 Chronic systolic (congestive) heart failure: Secondary | ICD-10-CM

## 2019-02-21 MED ORDER — CARVEDILOL 12.5 MG PO TABS: 12.5000 mg | ORAL_TABLET | Freq: Two times a day (BID) | ORAL | 3 refills | Status: DC

## 2019-02-21 MED ORDER — FUROSEMIDE 80 MG PO TABS: 80.0000 mg | ORAL_TABLET | Freq: Two times a day (BID) | ORAL | 3 refills | Status: DC

## 2019-02-21 MED ORDER — LISINOPRIL 5 MG PO TABS: 5.0000 mg | ORAL_TABLET | Freq: Every day | ORAL | 3 refills | Status: DC

## 2019-03-21 ENCOUNTER — Ambulatory Visit: Payer: Self-pay | Attending: Family | Admitting: Family

## 2019-03-21 ENCOUNTER — Other Ambulatory Visit: Payer: Self-pay

## 2019-03-21 ENCOUNTER — Encounter: Payer: Self-pay | Admitting: Family

## 2019-03-21 VITALS — BP 181/115 | HR 111 | Resp 18 | Ht 75.0 in | Wt 394.5 lb

## 2019-03-21 DIAGNOSIS — Z791 Long term (current) use of non-steroidal anti-inflammatories (NSAID): Secondary | ICD-10-CM | POA: Insufficient documentation

## 2019-03-21 DIAGNOSIS — Z825 Family history of asthma and other chronic lower respiratory diseases: Secondary | ICD-10-CM | POA: Insufficient documentation

## 2019-03-21 DIAGNOSIS — F17210 Nicotine dependence, cigarettes, uncomplicated: Secondary | ICD-10-CM | POA: Insufficient documentation

## 2019-03-21 DIAGNOSIS — I110 Hypertensive heart disease with heart failure: Secondary | ICD-10-CM | POA: Insufficient documentation

## 2019-03-21 DIAGNOSIS — Z79899 Other long term (current) drug therapy: Secondary | ICD-10-CM | POA: Insufficient documentation

## 2019-03-21 DIAGNOSIS — I10 Essential (primary) hypertension: Secondary | ICD-10-CM

## 2019-03-21 DIAGNOSIS — Z8249 Family history of ischemic heart disease and other diseases of the circulatory system: Secondary | ICD-10-CM | POA: Insufficient documentation

## 2019-03-21 DIAGNOSIS — Z881 Allergy status to other antibiotic agents status: Secondary | ICD-10-CM | POA: Insufficient documentation

## 2019-03-21 DIAGNOSIS — Z7984 Long term (current) use of oral hypoglycemic drugs: Secondary | ICD-10-CM | POA: Insufficient documentation

## 2019-03-21 DIAGNOSIS — G4733 Obstructive sleep apnea (adult) (pediatric): Secondary | ICD-10-CM | POA: Insufficient documentation

## 2019-03-21 DIAGNOSIS — J45909 Unspecified asthma, uncomplicated: Secondary | ICD-10-CM | POA: Insufficient documentation

## 2019-03-21 DIAGNOSIS — E119 Type 2 diabetes mellitus without complications: Secondary | ICD-10-CM | POA: Insufficient documentation

## 2019-03-21 DIAGNOSIS — Z823 Family history of stroke: Secondary | ICD-10-CM | POA: Insufficient documentation

## 2019-03-21 DIAGNOSIS — I5022 Chronic systolic (congestive) heart failure: Secondary | ICD-10-CM | POA: Insufficient documentation

## 2019-03-21 MED ORDER — METFORMIN HCL 500 MG PO TABS: 500.0000 mg | ORAL_TABLET | Freq: Two times a day (BID) | ORAL | 0 refills | Status: AC

## 2019-03-21 NOTE — Patient Instructions (Addendum)
Continue weighing daily and call for an overnight weight gain of >2 pounds or a weekly weight gain of >5 pounds.  Increase coreg to 25mg  twice a day. You may take 2 tablets of your current bottle twice day day until you finish this bottle. Then go back to taking 1 tablet twice a day of the new bottle.

## 2019-03-21 NOTE — Progress Notes (Signed)
Subjective:    Patient ID: Marc Wright, male    DOB: 02-25-1976, 43 y.o.   MRN: [id number]  HPI  Mr Ouimette is a 43 y/o male with a history of obstructive sleep apnea (with CPAP), DM, asthma, obesity, tobacco use and chronic heart failure.   Last echo was done 06/26/16 and showed an EF of 25-30% with mild MR/TR.   Was in the ED 12/19/2018 and 12/23/2018 for right leg pain. Negative x-ray and negative for DVT. Was treated and released. Was admitted 11/06/2018 for pneumonia and cellulitis. Was treated with IV antibiotics and discharged after 5 days.      He presents today for his follow-up visit with a chief complaint of fatigue. He states he has been working 60 hours a week and gets tired after his shifts. He also notes right leg pain that is getting better with acupuncture. He originally went to the emergency room to get this looked at and he does not have a PCP at this time. He denies shortness of breath, cough, leg swelling, chest pain, palpitations, dizziness, and trouble sleeping. He states that he has not been exercising as much and occasionally does add some salt to his food.   Past Medical History:  Diagnosis Date  . Asthma   . CHF (congestive heart failure) (HCC)   . Diabetes mellitus without complication (HCC)   . OSA on CPAP   . Pneumonia 06/2016   Past Surgical History:  Procedure Laterality Date  . I&D EXTREMITY    . SKIN GRAFT     Family History  Problem Relation Age of Onset  . Congestive Heart Failure Mother   . Hypertension Mother   . Stroke Father        2017  . Asthma Father    Social History   Tobacco Use  . Smoking status: Current Every Day Smoker    Packs/day: 0.75    Types: Cigarettes    Start date: 11/23/2016  . Smokeless tobacco: Never Used  Substance Use Topics  . Alcohol use: Yes    Comment: occasional    Allergies  Allergen Reactions  . Azithromycin Itching and Rash    Burning rash with intravenous dose   Prior to Admission medications    Medication Sig Start Date End Date Taking? Authorizing Provider  B Complex-C (B-COMPLEX WITH VITAMIN C) tablet Take 1 tablet by mouth daily.   Yes [provider]  CALCIUM/MAGNESIUM/ZINC FORMULA PO Take 1 tablet by mouth daily as needed (muscle cramps).    Yes [provider]  carvedilol (COREG) 12.5 MG tablet Take 1 tablet (12.5 mg total) by mouth 2 (two) times daily with a meal. 02/21/19  Yes Hackney, Tina A, FNP  diclofenac (VOLTAREN) 75 MG EC tablet Take 1 tablet (75 mg total) by mouth 2 (two) times daily. 12/23/18  Yes Cheron Schaumann K, PA-C  furosemide (LASIX) 80 MG tablet Take 1 tablet (80 mg total) by mouth 2 (two) times daily. 02/21/19  Yes Hackney, [name] A, FNP  HYDROcodone-acetaminophen (NORCO/VICODIN) 5-325 MG tablet Take 1 tablet by mouth every 6 (six) hours as needed for moderate pain. 12/19/18  Yes Dionne Bucy, MD  lisinopril (ZESTRIL) 5 MG tablet Take 1 tablet (5 mg total) by mouth daily. 02/21/19  Yes Hackney, [name] A, FNP  metFORMIN (GLUCOPHAGE) 500 MG tablet Take 500 mg by mouth 2 (two) times daily with a meal.   Yes [provider]  multivitamin (ONE-A-DAY MEN'S) TABS tablet Take 1 tablet by mouth daily.  Yes [provider]  niacin 250 MG tablet Take 250 mg by mouth at bedtime.   Yes [provider]  nicotine (NICODERM CQ - DOSED IN MG/24 HOURS) 14 mg/24hr patch Place 1 patch (14 mg total) onto the skin daily. 11/12/18  Yes Alwyn Ren, MD  torsemide (DEMADEX) 10 MG tablet Take 1 tablet (10 mg total) by mouth daily. In the morning to decrease leg swelling; continue lasix 11/24/18  Yes Fulp, Cammie, MD  fexofenadine (ALLEGRA) 180 MG tablet Take 1 tablet (180 mg total) by mouth daily. Taking as needed Patient not taking: Reported on 11/19/2018 11/11/18   Alwyn Ren, MD    Review of Systems  Constitutional: Positive for fatigue. Negative for appetite change.  HENT: Positive for congestion. Negative for postnasal drip  and sore throat.   Eyes: Negative.   Respiratory: Negative for cough, chest tightness and shortness of breath.   Cardiovascular: Negative for chest pain, palpitations and leg swelling.  Gastrointestinal: Negative for abdominal distention and abdominal pain.  Endocrine: Negative.   Genitourinary: Negative.   Musculoskeletal: Positive for arthralgias (right knee/ankle pain in the mornings). Negative for back pain and neck pain.  Skin: Negative.   Allergic/Immunologic: Negative.   Neurological: Negative for dizziness and light-headedness.  Hematological: Negative for adenopathy. Does not bruise/bleed easily.  Psychiatric/Behavioral: Negative for dysphoric mood, sleep disturbance (wearing CPAP nightly) and suicidal ideas. The patient is not nervous/anxious.       Vitals:   03/21/19 1038  BP: (!) 181/115  Pulse: (!) 111  Resp: 18  SpO2: 96%   Filed Weights   03/21/19 1038  Weight: (!) 394 lb 8 oz (178.9 kg)   Lab Results  Component Value Date   CREATININE 0.73 12/23/2018   CREATININE 0.76 11/24/2018   CREATININE 0.63 11/11/2018    Objective:   Physical Exam  Constitutional: He is oriented to person, place, and time. He appears well-developed and well-nourished.  HENT:  Head: Normocephalic and atraumatic.  Neck: Normal range of motion. Neck supple. No JVD present.  Cardiovascular: Regular rhythm. Tachycardia present.  Pulmonary/Chest: Effort normal. He has no wheezes. He has no rales.  Abdominal: Soft. He exhibits no distension. There is no abdominal tenderness.  Musculoskeletal:        General: No tenderness or edema.  Neurological: He is alert and oriented to person, place, and time.  Skin: Skin is warm and dry.  Psychiatric: He has a normal mood and affect. His behavior is normal. Thought content normal.  Nursing note and vitals reviewed.     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  - occasionally adding salt to his food at home but does cook with it at work and, again, he's sampling the food every time he is at work cooking. Importance of closely following a 2000mg  sodium diet was reviewed with him.  - tolerating lisinopril 5mg  daily; fatigue worsened when entresto tried - BNP 11/06/2018 46.9  2: HTN- - BP 160/102 on manual recheck. He states he was in a rush to get here and  took his medications late this morning.   - heart rate 111, will increase carvedilol to 25mg  BID. He can double his dose of 12.5mg  twice a day until he runs out of his current bottle.  - BMP from 12/23/2018 reviewed and showed sodium 135, potassium 4.4, creatinine 0.73 and GFR >60 - He does not have a PCP at this point. He was given information on a couple of clinics where he can call to establish care.  - Wearing compression socks while at work and standing for 10+ hours.   3: Obstructive sleep apnea- - wearing CPAP nightly and feels like he's sleeping well  4: Diabetes- - glucose a yesterday 268 after a meal - A1c on 11/07/2018 was 13.3% - 30 day refill of metformin sent to pharmacy to cover him until he establishes care with a PCP  Patient did not bring medications nor a list. Each medication was verbally reviewed with the patient and was encouraged to bring the bottles to every visit to confirm accuracy of the list.   Return in 4 weeks or sooner for any questions/ problems before then.

## 2019-04-13 NOTE — Progress Notes (Deleted)
Subjective:    Patient ID: Marc Wright, male    DOB: [birth year], 43 y.o.   MRN: [id number]  HPI  Marc Wright is a 43 y/o male with a history of obstructive sleep apnea (with CPAP), DM, asthma, obesity, tobacco use and chronic heart failure.   Last echo was done 06/26/16 and showed an EF of 25-30% with mild Marc/TR.   Was in the ED 12/19/2018 and 12/23/2018 for right leg pain. Negative x-ray and negative for DVT. Was treated and released. Was admitted 11/06/2018 for pneumonia and cellulitis. Was treated with IV antibiotics and discharged after 5 days.      He presents today for his follow-up visit with a chief complaint  Past Medical History:  Diagnosis Date  . Asthma   . CHF (congestive heart failure) (HCC)   . Diabetes mellitus without complication (HCC)   . OSA on CPAP   . Pneumonia 06/2016   Past Surgical History:  Procedure Laterality Date  . I&D EXTREMITY    . SKIN GRAFT     Family History  Problem Relation Age of Onset  . Congestive Heart Failure Mother   . Hypertension Mother   . Stroke Father        2017  . Asthma Father    Social History   Tobacco Use  . Smoking status: Current Every Day Smoker    Packs/day: 0.75    Types: Cigarettes    Start date: 11/23/2016  . Smokeless tobacco: Never Used  Substance Use Topics  . Alcohol use: Yes    Comment: occasional    Allergies  Allergen Reactions  . Azithromycin Itching and Rash    Burning rash with intravenous dose     Review of Systems  Constitutional: Positive for fatigue. Negative for appetite change.  HENT: Positive for congestion. Negative for postnasal drip and sore throat.   Eyes: Negative.   Respiratory: Negative for cough, chest tightness and shortness of breath.   Cardiovascular: Negative for chest pain, palpitations and leg swelling.  Gastrointestinal: Negative for abdominal distention and abdominal pain.  Endocrine: Negative.   Genitourinary: Negative.   Musculoskeletal: Positive for arthralgias  (right knee/ankle pain in the mornings). Negative for back pain and neck pain.  Skin: Negative.   Allergic/Immunologic: Negative.   Neurological: Negative for dizziness and light-headedness.  Hematological: Negative for adenopathy. Does not bruise/bleed easily.  Psychiatric/Behavioral: Negative for dysphoric mood, sleep disturbance (wearing CPAP nightly) and suicidal ideas. The patient is not nervous/anxious.         Objective:   Physical Exam  Constitutional: He is oriented to person, place, and time. He appears well-developed and well-nourished.  HENT:  Head: Normocephalic and atraumatic.  Neck: Normal range of motion. Neck supple. No JVD present.  Cardiovascular: Regular rhythm. Tachycardia present.  Pulmonary/Chest: Effort normal. He has no wheezes. He has no rales.  Abdominal: Soft. He exhibits no distension. There is no abdominal tenderness.  Musculoskeletal:        General: No tenderness or edema.  Neurological: He is alert and oriented to person, place, and time.  Skin: Skin is warm and dry.  Psychiatric: He has a normal mood and affect. His behavior is normal. Thought content normal.  Nursing note and vitals reviewed.     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  - occasionally adding salt to his food at home but does cook with it at work and, again, he's sampling the food every time he is at work cooking. Importance of closely following a 2000mg  sodium diet was reviewed with him.  - tolerating lisinopril 5mg  daily; fatigue worsened when entresto tried - BNP 11/06/2018 46.9  2: HTN- - BP 160/102 on manual  recheck. He states he was in a rush to get here and took his medications late this morning.   - heart rate 111, will increase carvedilol to 25mg  BID. He can double his dose of 12.5mg  twice a day until he runs out of his current bottle.  - BMP from 12/23/2018 reviewed and showed sodium 135, potassium 4.4, creatinine 0.73 and GFR >60 - He does not have a PCP at this point. He was given information on a couple of clinics where he can call to establish care.  - Wearing compression socks while at work and standing for 10+ hours.   3: Obstructive sleep apnea- - wearing CPAP nightly and feels like he's sleeping well  4: Diabetes- - glucose a yesterday 268 after a meal - A1c on 11/07/2018 was 13.3%  Patient did not bring medications nor a list. Each medication was verbally reviewed with the patient and was encouraged to bring the bottles to every visit to confirm accuracy of the list.   Return in 4 weeks or sooner for any questions/ problems before then.

## 2019-04-18 ENCOUNTER — Ambulatory Visit: Payer: Self-pay | Admitting: Family

## 2019-04-30 NOTE — Progress Notes (Deleted)
Subjective:    Patient ID: Marc Wright, male    DOB: 12-Sep-1975, 43 y.o.   MRN: [id number]  HPI  Marc Wright is a 43 y/o male with a history of obstructive sleep apnea (with CPAP), DM, asthma, obesity, tobacco use and chronic heart failure.   Last echo was done 06/26/16 and showed an EF of 25-30% with mild Marc/TR.   Was in the ED 12/19/2018 and 12/23/2018 for right leg pain. Negative x-ray and negative for DVT. Was treated and released. Was admitted 11/06/2018 for pneumonia and cellulitis. Was treated with IV antibiotics and discharged after 5 days.      He presents today for his follow-up visit with a chief complaint of   Past Medical History:  Diagnosis Date  . Asthma   . CHF (congestive heart failure) ([place])   . Diabetes mellitus without complication ([place])   . OSA on CPAP   . Pneumonia 06/2016   Past Surgical History:  Procedure Laterality Date  . I&D EXTREMITY    . SKIN GRAFT     Family History  Problem Relation Age of Onset  . Congestive Heart Failure Mother   . Hypertension Mother   . Stroke Father        2017  . Asthma Father    Social History   Tobacco Use  . Smoking status: Current Every Day Smoker    Packs/day: 0.75    Types: Cigarettes    Start date: 11/23/2016  . Smokeless tobacco: Never Used  Substance Use Topics  . Alcohol use: Yes    Comment: occasional    Allergies  Allergen Reactions  . Azithromycin Itching and Rash    Burning rash with intravenous dose     Review of Systems  Constitutional: Positive for fatigue. Negative for appetite change.  HENT: Positive for congestion. Negative for postnasal drip and sore throat.   Eyes: Negative.   Respiratory: Negative for cough, chest tightness and shortness of breath.   Cardiovascular: Negative for chest pain, palpitations and leg swelling.  Gastrointestinal: Negative for abdominal distention and abdominal pain.  Endocrine: Negative.   Genitourinary: Negative.   Musculoskeletal: Positive for arthralgias  (right knee/ankle pain in the mornings). Negative for back pain and neck pain.  Skin: Negative.   Allergic/Immunologic: Negative.   Neurological: Negative for dizziness and light-headedness.  Hematological: Negative for adenopathy. Does not bruise/bleed easily.  Psychiatric/Behavioral: Negative for dysphoric mood, sleep disturbance (wearing CPAP nightly) and suicidal ideas. The patient is not nervous/anxious.      Objective:   Physical Exam  Constitutional: He is oriented to person, place, and time. He appears well-developed and well-nourished.  HENT:  Head: Normocephalic and atraumatic.  Neck: Normal range of motion. Neck supple. No JVD present.  Cardiovascular: Regular rhythm. Tachycardia present.  Pulmonary/Chest: Effort normal. He has no wheezes. He has no rales.  Abdominal: Soft. He exhibits no distension. There is no abdominal tenderness.  Musculoskeletal:        General: No tenderness or edema.  Neurological: He is alert and oriented to person, place, and time.  Skin: Skin is warm and dry.  Psychiatric: He has a normal mood and affect. His behavior is normal. Thought content normal.  Nursing note and vitals reviewed.     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  - occasionally adding salt to his food at home but does cook with it at work and, again, he's sampling the food every time he is at work cooking. Importance of closely following a 2000mg  sodium diet was reviewed with him.  - tolerating lisinopril 5mg  daily; fatigue worsened when entresto tried - BNP 11/06/2018 46.9  2: HTN- - BP  - carvedilol  increased to 25mg  BID at last visit  - BMP from 12/23/2018 reviewed and showed sodium 135, potassium 4.4, creatinine 0.73 and GFR >60 - He does not have a PCP at this point. He was given information on a couple of clinics where he can call to establish care.  - Wearing compression socks while at work and standing for 10+ hours.   3: Obstructive sleep apnea- - wearing CPAP nightly and feels like he's sleeping well  4: Diabetes- - glucose  - A1c on 11/07/2018 was 13.3%   Patient did not bring medications nor a list. Each medication was verbally reviewed with the patient and was encouraged to bring the bottles to every visit to confirm accuracy of the list.

## 2019-05-02 ENCOUNTER — Ambulatory Visit: Payer: Self-pay | Admitting: Family

## 2019-05-13 ENCOUNTER — Other Ambulatory Visit: Payer: Self-pay

## 2019-05-13 ENCOUNTER — Encounter: Payer: Self-pay | Admitting: Family

## 2019-05-13 ENCOUNTER — Ambulatory Visit: Payer: Self-pay | Attending: Family | Admitting: Family

## 2019-05-13 VITALS — BP 150/89 | HR 49 | Resp 18 | Ht 75.0 in | Wt >= 6400 oz

## 2019-05-13 DIAGNOSIS — J45909 Unspecified asthma, uncomplicated: Secondary | ICD-10-CM | POA: Insufficient documentation

## 2019-05-13 DIAGNOSIS — Z825 Family history of asthma and other chronic lower respiratory diseases: Secondary | ICD-10-CM | POA: Insufficient documentation

## 2019-05-13 DIAGNOSIS — I10 Essential (primary) hypertension: Secondary | ICD-10-CM

## 2019-05-13 DIAGNOSIS — F17210 Nicotine dependence, cigarettes, uncomplicated: Secondary | ICD-10-CM | POA: Insufficient documentation

## 2019-05-13 DIAGNOSIS — Z8249 Family history of ischemic heart disease and other diseases of the circulatory system: Secondary | ICD-10-CM | POA: Insufficient documentation

## 2019-05-13 DIAGNOSIS — G4733 Obstructive sleep apnea (adult) (pediatric): Secondary | ICD-10-CM | POA: Insufficient documentation

## 2019-05-13 DIAGNOSIS — Z823 Family history of stroke: Secondary | ICD-10-CM | POA: Insufficient documentation

## 2019-05-13 DIAGNOSIS — Z6841 Body Mass Index (BMI) 40.0 and over, adult: Secondary | ICD-10-CM | POA: Insufficient documentation

## 2019-05-13 DIAGNOSIS — Z881 Allergy status to other antibiotic agents status: Secondary | ICD-10-CM | POA: Insufficient documentation

## 2019-05-13 DIAGNOSIS — E119 Type 2 diabetes mellitus without complications: Secondary | ICD-10-CM | POA: Insufficient documentation

## 2019-05-13 DIAGNOSIS — E669 Obesity, unspecified: Secondary | ICD-10-CM | POA: Insufficient documentation

## 2019-05-13 DIAGNOSIS — I5022 Chronic systolic (congestive) heart failure: Secondary | ICD-10-CM | POA: Insufficient documentation

## 2019-05-13 DIAGNOSIS — Z7984 Long term (current) use of oral hypoglycemic drugs: Secondary | ICD-10-CM | POA: Insufficient documentation

## 2019-05-13 DIAGNOSIS — Z79899 Other long term (current) drug therapy: Secondary | ICD-10-CM | POA: Insufficient documentation

## 2019-05-13 DIAGNOSIS — I110 Hypertensive heart disease with heart failure: Secondary | ICD-10-CM | POA: Insufficient documentation

## 2019-05-13 LAB — GLUCOSE, CAPILLARY: Glucose-Capillary: 277 mg/dL — ABNORMAL HIGH (ref 70–99)

## 2019-05-13 NOTE — Progress Notes (Signed)
Subjective:    Patient ID: Marc Wright, male    DOB: 1976/02/22, 43 y.o.   MRN: [id number]  HPI  Marc Wright is a 43 y/o male with a history of obstructive sleep apnea (with CPAP), DM, asthma, obesity, tobacco use and chronic heart failure.   Last echo was done 06/26/16 and showed an EF of 25-30% with mild Marc/TR.   Was in the ED 12/19/2018 and 12/23/2018 for right leg pain. Negative x-ray and negative for DVT. Was treated and released.    He presents today for his follow-up visit with a chief complaint of minimal fatigue upon moderate exertion. He describes this as chronic in nature having been present for several years. He has associated head congestion, joint pain and gradual weight gain along with this. He denies any difficulty sleeping, dizziness, abdominal distention, palpitations, pedal edema, chest pain, shortness of breath or cough.   Has recently moved so last night he did eat chinese take-ou and he's aware that it was high in sodium. He has not gotten established with a PCP yet.   Past Medical History:  Diagnosis Date  . Asthma   . CHF (congestive heart failure) ([place])   . Diabetes mellitus without complication ([place])   . OSA on CPAP   . Pneumonia 06/2016   Past Surgical History:  Procedure Laterality Date  . I&D EXTREMITY    . SKIN GRAFT     Family History  Problem Relation Age of Onset  . Congestive Heart Failure Mother   . Hypertension Mother   . Stroke Father        2017  . Asthma Father    Social History   Tobacco Use  . Smoking status: Current Every Day Smoker    Packs/day: 0.75    Types: Cigarettes    Start date: 11/23/2016  . Smokeless tobacco: Never Used  Substance Use Topics  . Alcohol use: Yes    Comment: occasional    Allergies  Allergen Reactions  . Azithromycin Itching and Rash    Burning rash with intravenous dose   Prior to Admission medications   Medication Sig Start Date End Date Taking? Authorizing Provider  B Complex-C (B-COMPLEX WITH  VITAMIN C) tablet Take 1 tablet by mouth daily.   Yes [provider]  CALCIUM/MAGNESIUM/ZINC FORMULA PO Take 1 tablet by mouth daily as needed (muscle cramps).    Yes [provider]  carvedilol (COREG) 12.5 MG tablet Take 1 tablet (12.5 mg total) by mouth 2 (two) times daily with a meal. Patient taking differently: Take 25 mg by mouth 2 (two) times daily with a meal.  02/21/19  Yes ,  A, FNP  fexofenadine (ALLEGRA) 180 MG tablet Take 1 tablet (180 mg total) by mouth daily. Taking as needed 11/11/18  Yes Georgette Shell, MD  furosemide (LASIX) 80 MG tablet Take 1 tablet (80 mg total) by mouth 2 (two) times daily. 02/21/19  Yes , Otila Kluver A, FNP  lisinopril (ZESTRIL) 5 MG tablet Take 1 tablet (5 mg total) by mouth daily. 02/21/19  Yes Alisa Graff, FNP  metFORMIN (GLUCOPHAGE) 500 MG tablet Take 1 tablet (500 mg total) by mouth 2 (two) times daily with a meal. 03/21/19  Yes ,  A, FNP  multivitamin (ONE-A-DAY MEN'S) TABS tablet Take 1 tablet by mouth daily.   Yes [provider]  niacin 250 MG tablet Take 250 mg by mouth at bedtime.   Yes [provider]     Review of  Systems  Constitutional: Positive for fatigue. Negative for appetite change.  HENT: Positive for congestion. Negative for postnasal drip and sore throat.   Eyes: Negative.   Respiratory: Negative for cough, chest tightness and shortness of breath.   Cardiovascular: Negative for chest pain, palpitations and leg swelling.  Gastrointestinal: Negative for abdominal distention and abdominal pain.  Endocrine: Negative.   Genitourinary: Negative.   Musculoskeletal: Positive for arthralgias (joint pain). Negative for back pain and neck pain.  Skin: Negative.   Allergic/Immunologic: Negative.   Neurological: Negative for dizziness and light-headedness.  Hematological: Negative for adenopathy. Does not bruise/bleed easily.  Psychiatric/Behavioral: Negative for dysphoric  mood, sleep disturbance (wearing CPAP nightly) and suicidal ideas. The patient is not nervous/anxious.    Vitals:   05/13/19 1048  BP: (!) 150/89  Pulse: (!) 49  Resp: 18  SpO2: 93%  Weight: (!) 401 lb 6.4 oz (182.1 kg)  Height: 6\' 3"  (1.905 m)   Wt Readings from Last 3 Encounters:  05/13/19 (!) 401 lb 6.4 oz (182.1 kg)  03/21/19 (!) 394 lb 8 oz (178.9 kg)  12/23/18 (!) 390 lb (176.9 kg)   Lab Results  Component Value Date   CREATININE 0.73 12/23/2018   CREATININE 0.76 11/24/2018   CREATININE 0.63 11/11/2018    Objective:   Physical Exam  Constitutional: He is oriented to person, place, and time. He appears well-developed and well-nourished.  HENT:  Head: Normocephalic and atraumatic.  Neck: Normal range of motion. Neck supple. No JVD present.  Cardiovascular: Regular rhythm. Bradycardia present.  Pulmonary/Chest: Effort normal. He has no wheezes. He has no rales.  Abdominal: Soft. He exhibits no distension. There is no abdominal tenderness.  Musculoskeletal:        General: No tenderness or edema.  Neurological: He is alert and oriented to person, place, and time.  Skin: Skin is warm and dry.  Psychiatric: He has a normal mood and affect. His behavior is normal. Thought content normal.  Nursing note and vitals reviewed.     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  - occasionally adding salt to his food at home but does cook with it at work and, again, he's sampling the food every time he is at work cooking. Importance of closely following a 2000mg  sodium diet was  reviewed with him.  - tolerating lisinopril 5mg  daily; fatigue worsened when entresto tried - BNP 11/06/2018 46.9  2: HTN- - BP mildly elevated today but better from last visit - BMP from 12/23/2018 reviewed and showed sodium 135, potassium 4.4, creatinine 0.73 and GFR >60 - He still has not established with a PCP; emphasized the importance of getting this done and he verbalizes that he understands  - Wearing compression socks while at work and standing for 10+ hours.  3: Obstructive sleep apnea- - wearing CPAP nightly and feels like he's sleeping well  4: Diabetes- - nonfasting glucose in clinic today was 277 - A1c on 11/07/2018 was 13.3% - explained that he needs to get with a  PCP so that his diabetes can be managed and that I would not be able to refill his metformin; patient verbalized understanding   Patient did not bring medications nor a list. Each medication was verbally reviewed with the patient and was encouraged to bring the bottles to every visit to confirm accuracy of the list.   Return in 6 months or sooner for any questions/problems before then.

## 2019-05-13 NOTE — Patient Instructions (Signed)
Continue weighing daily and call for an overnight weight gain of > 2 pounds or a weekly weight gain of >5 pounds. 

## 2019-10-11 ENCOUNTER — Ambulatory Visit: Payer: Self-pay | Admitting: Family

## 2019-10-17 ENCOUNTER — Ambulatory Visit: Payer: Self-pay | Admitting: Family

## 2019-11-04 NOTE — Progress Notes (Deleted)
Subjective:    Patient ID: Marc Wright, male    DOB: 1976-05-13, 44 y.o.   MRN: [id number]  HPI  Marc Wright is a 44 y/o male with a history of obstructive sleep apnea (with CPAP), DM, asthma, obesity, tobacco use and chronic heart failure.   Last echo was done 06/26/16 and showed an EF of 25-30% with mild Marc/TR.   Has not been admitted or been in the ED in the last 6 months.     He presents today for his follow-up visit with a chief complaint of   Past Medical History:  Diagnosis Date  . Asthma   . CHF (congestive heart failure) ([facility])   . Diabetes mellitus without complication ([facility])   . OSA on CPAP   . Pneumonia 06/2016   Past Surgical History:  Procedure Laterality Date  . I & D EXTREMITY    . SKIN GRAFT     Family History  Problem Relation Age of Onset  . Congestive Heart Failure Mother   . Hypertension Mother   . Stroke Father        2017  . Asthma Father    Social History   Tobacco Use  . Smoking status: Current Every Day Smoker    Packs/day: 0.75    Types: Cigarettes    Start date: 11/23/2016  . Smokeless tobacco: Never Used  Substance Use Topics  . Alcohol use: Yes    Comment: occasional    Allergies  Allergen Reactions  . Azithromycin Itching and Rash    Burning rash with intravenous dose      Review of Systems  Constitutional: Positive for fatigue. Negative for appetite change.  HENT: Positive for congestion. Negative for postnasal drip and sore throat.   Eyes: Negative.   Respiratory: Negative for cough, chest tightness and shortness of breath.   Cardiovascular: Negative for chest pain, palpitations and leg swelling.  Gastrointestinal: Negative for abdominal distention and abdominal pain.  Endocrine: Negative.   Genitourinary: Negative.   Musculoskeletal: Positive for arthralgias (joint pain). Negative for back pain and neck pain.  Skin: Negative.   Allergic/Immunologic: Negative.   Neurological: Negative for dizziness and light-headedness.   Hematological: Negative for adenopathy. Does not bruise/bleed easily.  Psychiatric/Behavioral: Negative for dysphoric mood, sleep disturbance (wearing CPAP nightly) and suicidal ideas. The patient is not nervous/anxious.      Objective:   Physical Exam  Constitutional: He is oriented to person, place, and time. He appears well-developed and well-nourished.  HENT:  Head: Normocephalic and atraumatic.  Neck: No JVD present.  Cardiovascular: Regular rhythm. Bradycardia present.  Pulmonary/Chest: Effort normal. He has no wheezes. He has no rales.  Abdominal: Soft. He exhibits no distension. There is no abdominal tenderness.  Musculoskeletal:        General: No tenderness or edema.     Cervical back: Normal range of motion and neck supple.  Neurological: He is alert and oriented to person, place, and time.  Skin: Skin is warm and dry.  Psychiatric: He has a normal mood and affect. His behavior is normal. Thought content normal.  Nursing note and vitals reviewed.     Assessment & Plan:   1: Chronic heart failure with reduced ejection fraction- - NYHA class II - euvolemic today - reminded to call for an overnight weight gain of >2 pounds or a weekly weight gain of >5 pounds - weight 401.4 pounds since last visit here 6 months ago - has not been exercising or going to the  gym - he is a Biomedical scientist at [employer] and says that he has to sample the food as he's cooking it.  - occasionally adding salt to his food at home but does cook with it at work and, again, he's sampling the food every time he is at work cooking. Importance of closely following a 2000mg  sodium diet was reviewed with him.  - tolerating lisinopril 5mg  daily; fatigue worsened when entresto tried - BNP 11/06/2018 46.9  2: HTN- - BP  - BMP from 12/23/2018 reviewed and showed sodium 135, potassium 4.4, creatinine 0.73 and GFR >60 - He still has not established with a PCP; emphasized the importance of getting this done and  he verbalizes that he understands  - Wearing compression socks while at work and standing for 10+ hours.  3: Obstructive sleep apnea- - wearing CPAP nightly and feels like he's sleeping well  4: Diabetes- - nonfasting glucose in clinic today was  - A1c on 11/07/2018 was 13.3% - explained that he needs to get with a PCP so that his diabetes can be managed and that I would not be able to refill his metformin; patient verbalized understanding   Patient did not bring medications nor a list. Each medication was verbally reviewed with the patient and was encouraged to bring the bottles to every visit to confirm accuracy of the list.

## 2019-11-07 ENCOUNTER — Ambulatory Visit: Payer: Self-pay | Admitting: Family

## 2019-11-07 ENCOUNTER — Telehealth: Payer: Self-pay | Admitting: Family

## 2019-11-07 NOTE — Telephone Encounter (Signed)
Patient did not show for his [facility] appointment on 11/07/19. Will attempt to reschedule.

## 2020-06-21 ENCOUNTER — Emergency Department (HOSPITAL_COMMUNITY): Payer: Self-pay

## 2020-06-21 ENCOUNTER — Encounter (HOSPITAL_COMMUNITY): Payer: Self-pay

## 2020-06-21 ENCOUNTER — Other Ambulatory Visit: Payer: Self-pay

## 2020-06-21 DIAGNOSIS — E119 Type 2 diabetes mellitus without complications: Secondary | ICD-10-CM | POA: Insufficient documentation

## 2020-06-21 DIAGNOSIS — Z7901 Long term (current) use of anticoagulants: Secondary | ICD-10-CM | POA: Insufficient documentation

## 2020-06-21 DIAGNOSIS — I509 Heart failure, unspecified: Secondary | ICD-10-CM | POA: Insufficient documentation

## 2020-06-21 DIAGNOSIS — M79604 Pain in right leg: Secondary | ICD-10-CM | POA: Insufficient documentation

## 2020-06-21 DIAGNOSIS — F17210 Nicotine dependence, cigarettes, uncomplicated: Secondary | ICD-10-CM | POA: Insufficient documentation

## 2020-06-21 DIAGNOSIS — Z7984 Long term (current) use of oral hypoglycemic drugs: Secondary | ICD-10-CM | POA: Insufficient documentation

## 2020-06-21 DIAGNOSIS — J45909 Unspecified asthma, uncomplicated: Secondary | ICD-10-CM | POA: Insufficient documentation

## 2020-06-21 NOTE — ED Triage Notes (Signed)
Pt presents with c/o right leg pain. Pt reports the pain started yesterday, does not have a reason for the pain, denies any injury. Pt reports the pain is along his shin bone area. Pt denies any swelling or redness to the area. Pt denies any shortness of breath or recent travel.

## 2020-06-22 ENCOUNTER — Emergency Department (HOSPITAL_COMMUNITY)
Admission: EM | Admit: 2020-06-22 | Discharge: 2020-06-22 | Disposition: A | Discharge status: Home / Self Care | Payer: Self-pay | Attending: Emergency Medicine | Admitting: Emergency Medicine

## 2020-06-22 ENCOUNTER — Inpatient Hospital Stay (HOSPITAL_COMMUNITY): Admission: RE | Admit: 2020-06-22 | Payer: Self-pay | Source: Ambulatory Visit

## 2020-06-22 DIAGNOSIS — M79604 Pain in right leg: Secondary | ICD-10-CM

## 2020-06-22 MED ORDER — ONDANSETRON 4 MG PO TBDP
4.0000 mg | ORAL_TABLET | Freq: Once | ORAL | Status: AC
  Administered 2020-06-22: 4 mg via ORAL
  Filled 2020-06-22: qty 1

## 2020-06-22 MED ORDER — ONDANSETRON 4 MG PO TBDP: 4.0000 mg | ORAL_TABLET | Freq: Three times a day (TID) | ORAL | 0 refills | Status: DC | PRN

## 2020-06-22 MED ORDER — IBUPROFEN 800 MG PO TABS
800.0000 mg | ORAL_TABLET | Freq: Once | ORAL | Status: AC
  Administered 2020-06-22: 800 mg via ORAL
  Filled 2020-06-22: qty 1

## 2020-06-22 MED ORDER — HYDROCODONE-ACETAMINOPHEN 5-325 MG PO TABS: 2.0000 | ORAL_TABLET | Freq: Four times a day (QID) | ORAL | 0 refills | Status: AC | PRN

## 2020-06-22 MED ORDER — HYDROCODONE-ACETAMINOPHEN 5-325 MG PO TABS
1.0000 | ORAL_TABLET | Freq: Once | ORAL | Status: AC
  Administered 2020-06-22: 1 via ORAL
  Filled 2020-06-22: qty 1

## 2020-06-22 NOTE — Discharge Instructions (Addendum)
You may alternate Tylenol 1000 mg every 6 hours as needed for pain, fever and Ibuprofen 800 mg every 8 hours as needed for pain, fever.  Please take Ibuprofen with food.  Do not take more than 4000 mg of Tylenol (acetaminophen) in a 24 hour period.  Your x-ray showed no fracture.  You have no sign of infection on exam.  You have strong pulses.  We recommend you return in the morning for an ultrasound to rule out a DVT (clot in your veins).  I recommend close follow-up with a primary care physician if symptoms do not improve with anti-inflammatory such as ibuprofen, rest, elevation and ice.  You are being provided a prescription for opiates (also known as narcotics) for pain control.  Opiates can be addictive and should only be used when absolutely necessary for pain control when other alternatives do not work.  We recommend you only use them for the recommended amount of time and only as prescribed.  Please do not take with other sedative medications or alcohol.  Please do not drive, operate machinery, make important decisions while taking opiates.  Please note that these medications can be addictive and have high abuse potential.  Patients can become addicted to narcotics after only taking them for a few days.  Please keep these medications locked away from children, teenagers or any family members with history of substance abuse.  Narcotic pain medicine may also make you constipated.  You may use over-the-counter medications such as MiraLAX, Colace to prevent constipation.  If you become constipated you may use over-the-counter enemas as needed.  Itching and nausea are common side effects of narcotic pain medication.  If you develop uncontrolled vomiting or a rash, please stop these medications.   Steps to find a Primary Care Provider (PCP):  Call [phone] or [phone] to access "[facility] Find a Doctor Service."  2.  You may also go on the [facility] website at  [email or web address]  3.  [facility] also frequently accepts new patients.  [facility]  [address] [place] [address] [phone]  4.  There are also multiple [facility], [facility] and [facility] practices throughout the [place] that are frequently accepting new patients. You may find a clinic that is close to your home and contact them.  [email or web address] [phone]  [facility] [email or web address]  [email or web address] [phone]  [facility] [email or web address] [phone]  5.  Local Health Departments also can provide primary care services.  [facility]  [address] [place], [place] [address] [phone]  [facility] Department [address] [place] [address] [phone]  [facility] [address] [place] [address]  [place] [address] [phone]

## 2020-06-22 NOTE — ED Provider Notes (Signed)
TIME SEEN: 5:38 AM  CHIEF COMPLAINT: Right leg pain  HPI: Patient is a 44 year old male with history of asthma, CHF, diabetes who presents to the emergency department with right shin pain that is sharp and severe in nature and worse with ambulating that started yesterday.  No injury that he can recall.  Pain does radiate into the calf.  No calf swelling compared to left side.  No chest pain or shortness of breath.  No history of PE or DVT.  States he had similar symptoms about 2 years ago and had normal x-rays, ultrasound and CT scan.  States symptoms resolved on their own after about 1 to 2 weeks.  States pain is worse with dorsiflexion and external rotation of the foot.  ROS: See HPI Constitutional: no fever  Eyes: no drainage  ENT: no runny nose   Cardiovascular:  no chest pain  Resp: no SOB  GI: no vomiting GU: no dysuria Integumentary: no rash  Allergy: no hives  Musculoskeletal: no leg swelling  Neurological: no slurred speech ROS otherwise negative  PAST MEDICAL HISTORY/PAST SURGICAL HISTORY:  Past Medical History:  Diagnosis Date  . Asthma   . CHF (congestive heart failure) ([facility])   . Diabetes mellitus without complication ([facility])   . OSA on CPAP   . Pneumonia 06/2016    MEDICATIONS:  Prior to Admission medications   Medication Sig Start Date End Date Taking? Authorizing Provider  B Complex-C (B-COMPLEX WITH VITAMIN C) tablet Take 1 tablet by mouth daily.    [provider]  CALCIUM/MAGNESIUM/ZINC FORMULA PO Take 1 tablet by mouth daily as needed (muscle cramps).     [provider]  carvedilol (COREG) 12.5 MG tablet Take 1 tablet (12.5 mg total) by mouth 2 (two) times daily with a meal. Patient taking differently: Take 25 mg by mouth 2 (two) times daily with a meal.  02/21/19   Delma Freeze, FNP  fexofenadine (ALLEGRA) 180 MG tablet Take 1 tablet (180 mg total) by mouth daily. Taking as needed 11/11/18   Alwyn Ren, MD  furosemide (LASIX) 80 MG  tablet Take 1 tablet (80 mg total) by mouth 2 (two) times daily. 02/21/19   Clarisa Kindred A, FNP  lisinopril (ZESTRIL) 5 MG tablet Take 1 tablet (5 mg total) by mouth daily. 02/21/19   Delma Freeze, FNP  metFORMIN (GLUCOPHAGE) 500 MG tablet Take 1 tablet (500 mg total) by mouth 2 (two) times daily with a meal. 03/21/19   Delma Freeze, FNP  multivitamin (ONE-A-DAY MEN'S) TABS tablet Take 1 tablet by mouth daily.    [provider]  niacin 250 MG tablet Take 250 mg by mouth at bedtime.    [provider]    ALLERGIES:  Allergies  Allergen Reactions  . Azithromycin Itching and Rash    Burning rash with intravenous dose    SOCIAL HISTORY:  Social History   Tobacco Use  . Smoking status: Current Every Day Smoker    Packs/day: 0.75    Types: Cigarettes    Start date: 11/23/2016  . Smokeless tobacco: Never Used  Substance Use Topics  . Alcohol use: Yes    Comment: occasional     FAMILY HISTORY: Family History  Problem Relation Age of Onset  . Congestive Heart Failure Mother   . Hypertension Mother   . Stroke Father        2017  . Asthma Father     EXAM: BP (!) 129/93 (BP Location: Left Arm)  Pulse 85   Temp 98.7 F (37.1 C) (Oral)   Resp 16   Ht 6\' 3"  (1.905 m)   Wt (!) 180.5 kg   SpO2 96%   BMI 49.75 kg/m  CONSTITUTIONAL: Alert and oriented and responds appropriately to questions. Well-appearing; well-nourished, morbidly obese HEAD: Normocephalic EYES: Conjunctivae clear, pupils appear equal, EOM appear intact ENT: normal nose; moist mucous membranes NECK: Supple, normal ROM CARD: RRR; S1 and S2 appreciated; no murmurs, no clicks, no rubs, no gallops RESP: Normal chest excursion without splinting or tachypnea; breath sounds clear and equal bilaterally; no wheezes, no rhonchi, no rales, no hypoxia or respiratory distress, speaking full sentences ABD/GI: Normal bowel sounds; non-distended; soft, non-tender, no rebound, no guarding, no peritoneal  signs, no hepatosplenomegaly BACK:  The back appears normal EXT: Patient is tender to palpation over the mid right shin without deformity.  There is no redness, warmth, ecchymosis, rash or other lesions present.  No bony deformity.  No joint effusions noted.  Compartments of the right lower extremity are soft.  Minimal tenderness in the right calf but no asymmetric swelling.  No peripheral edema.  2+ right DP pulse. SKIN: Normal color for age and race; warm; no rash on exposed skin NEURO: Moves all extremities equally PSYCH: The patient's mood and manner are appropriate.   MEDICAL DECISION MAKING: Patient here with right leg pain.  Differential includes tendinitis, DVT, fracture.  X-ray showed no acute abnormality today.  No sign of infection on exam.  Strong pulses.  No sign of compartment syndrome.  Will provide with work note and offer crutches.  Provided with prescriptions for ibuprofen and Vicodin for pain control.  Given outpatient PCP follow-up.  I have ordered an ultrasound to rule out DVT.  He will return in the morning.  He has no chest pain or shortness of breath.  On review of his records, it appears he was seen in the ED on 03/09/2018 and 12/19/2018 and had a normal venous Doppler of the right lower extremity and unremarkable x-ray during both of those visits.  Given this appears to be a chronic issue that seems to bother patient at least once a year, I have recommended close follow-up with a primary care doctor.  At this time, I do not feel there is any life-threatening condition present. I have reviewed, interpreted and discussed all results (EKG, imaging, lab, urine as appropriate) and exam findings with patient/family. I have reviewed nursing notes and appropriate previous records.  I feel the patient is safe to be discharged home without further emergent workup and can continue workup as an outpatient as needed. Discussed usual and customary return precautions. Patient/family verbalize  understanding and are comfortable with this plan.  Outpatient follow-up has been provided as needed. All questions have been answered.    Marc Wright was evaluated in Emergency Department on 06/22/2020 for the symptoms described in the history of present illness. He was evaluated in the context of the global COVID-19 pandemic, which necessitated consideration that the patient might be at risk for infection with the SARS-CoV-2 virus that causes COVID-19. Institutional protocols and algorithms that pertain to the evaluation of patients at risk for COVID-19 are in a state of rapid change based on information released by regulatory bodies including the CDC and federal and state organizations. These policies and algorithms were followed during the patient's care in the ED.      Jatin Naumann, 06/24/2020, DO 06/22/20 [phone]

## 2020-06-26 ENCOUNTER — Other Ambulatory Visit: Payer: Self-pay

## 2020-06-26 ENCOUNTER — Ambulatory Visit (HOSPITAL_COMMUNITY)
Admission: RE | Admit: 2020-06-26 | Discharge: 2020-06-26 | Disposition: A | Discharge status: Home / Self Care | Payer: Self-pay | Source: Ambulatory Visit | Attending: Emergency Medicine | Admitting: Emergency Medicine

## 2020-06-26 DIAGNOSIS — M79609 Pain in unspecified limb: Secondary | ICD-10-CM

## 2020-06-26 DIAGNOSIS — M79604 Pain in right leg: Secondary | ICD-10-CM | POA: Insufficient documentation

## 2020-06-26 NOTE — Progress Notes (Signed)
Lower extremity venous RT study completed.   Please see CV Proc for preliminary results.   Elesa Garman, RDMS  

## 2020-11-15 ENCOUNTER — Emergency Department (HOSPITAL_COMMUNITY): Payer: Medicaid Other

## 2020-11-15 ENCOUNTER — Encounter (HOSPITAL_COMMUNITY): Payer: Self-pay | Admitting: *Deleted

## 2020-11-15 ENCOUNTER — Other Ambulatory Visit: Payer: Self-pay

## 2020-11-15 ENCOUNTER — Inpatient Hospital Stay (HOSPITAL_COMMUNITY)
Admission: EM | Admit: 2020-11-15 | Discharge: 2020-11-28 | DRG: 286 | Disposition: A | Discharge status: Home / Self Care | Payer: Medicaid Other | Attending: Family Medicine | Admitting: Family Medicine

## 2020-11-15 DIAGNOSIS — R0902 Hypoxemia: Secondary | ICD-10-CM

## 2020-11-15 DIAGNOSIS — F17210 Nicotine dependence, cigarettes, uncomplicated: Secondary | ICD-10-CM | POA: Diagnosis present

## 2020-11-15 DIAGNOSIS — E662 Morbid (severe) obesity with alveolar hypoventilation: Secondary | ICD-10-CM | POA: Diagnosis present

## 2020-11-15 DIAGNOSIS — I472 Ventricular tachycardia: Secondary | ICD-10-CM | POA: Diagnosis not present

## 2020-11-15 DIAGNOSIS — Z781 Physical restraint status: Secondary | ICD-10-CM

## 2020-11-15 DIAGNOSIS — R042 Hemoptysis: Secondary | ICD-10-CM | POA: Diagnosis not present

## 2020-11-15 DIAGNOSIS — I509 Heart failure, unspecified: Secondary | ICD-10-CM | POA: Diagnosis present

## 2020-11-15 DIAGNOSIS — I160 Hypertensive urgency: Secondary | ICD-10-CM | POA: Diagnosis present

## 2020-11-15 DIAGNOSIS — R918 Other nonspecific abnormal finding of lung field: Secondary | ICD-10-CM

## 2020-11-15 DIAGNOSIS — Z20822 Contact with and (suspected) exposure to covid-19: Secondary | ICD-10-CM | POA: Diagnosis present

## 2020-11-15 DIAGNOSIS — J45909 Unspecified asthma, uncomplicated: Secondary | ICD-10-CM | POA: Diagnosis present

## 2020-11-15 DIAGNOSIS — Z713 Dietary counseling and surveillance: Secondary | ICD-10-CM

## 2020-11-15 DIAGNOSIS — Y838 Other surgical procedures as the cause of abnormal reaction of the patient, or of later complication, without mention of misadventure at the time of the procedure: Secondary | ICD-10-CM | POA: Diagnosis not present

## 2020-11-15 DIAGNOSIS — T8119XA Other postprocedural shock, initial encounter: Secondary | ICD-10-CM | POA: Diagnosis not present

## 2020-11-15 DIAGNOSIS — T4275XA Adverse effect of unspecified antiepileptic and sedative-hypnotic drugs, initial encounter: Secondary | ICD-10-CM | POA: Diagnosis not present

## 2020-11-15 DIAGNOSIS — T501X6A Underdosing of loop [high-ceiling] diuretics, initial encounter: Secondary | ICD-10-CM | POA: Diagnosis present

## 2020-11-15 DIAGNOSIS — I428 Other cardiomyopathies: Secondary | ICD-10-CM | POA: Diagnosis present

## 2020-11-15 DIAGNOSIS — E785 Hyperlipidemia, unspecified: Secondary | ICD-10-CM | POA: Diagnosis present

## 2020-11-15 DIAGNOSIS — G9341 Metabolic encephalopathy: Secondary | ICD-10-CM | POA: Diagnosis not present

## 2020-11-15 DIAGNOSIS — I471 Supraventricular tachycardia: Secondary | ICD-10-CM | POA: Diagnosis not present

## 2020-11-15 DIAGNOSIS — Z8701 Personal history of pneumonia (recurrent): Secondary | ICD-10-CM

## 2020-11-15 DIAGNOSIS — Z91120 Patient's intentional underdosing of medication regimen due to financial hardship: Secondary | ICD-10-CM

## 2020-11-15 DIAGNOSIS — G4733 Obstructive sleep apnea (adult) (pediatric): Secondary | ICD-10-CM

## 2020-11-15 DIAGNOSIS — J189 Pneumonia, unspecified organism: Secondary | ICD-10-CM

## 2020-11-15 DIAGNOSIS — Y92239 Unspecified place in hospital as the place of occurrence of the external cause: Secondary | ICD-10-CM | POA: Diagnosis not present

## 2020-11-15 DIAGNOSIS — Y92009 Unspecified place in unspecified non-institutional (private) residence as the place of occurrence of the external cause: Secondary | ICD-10-CM

## 2020-11-15 DIAGNOSIS — J9621 Acute and chronic respiratory failure with hypoxia: Secondary | ICD-10-CM | POA: Diagnosis present

## 2020-11-15 DIAGNOSIS — I110 Hypertensive heart disease with heart failure: Principal | ICD-10-CM | POA: Diagnosis present

## 2020-11-15 DIAGNOSIS — Z825 Family history of asthma and other chronic lower respiratory diseases: Secondary | ICD-10-CM | POA: Diagnosis not present

## 2020-11-15 DIAGNOSIS — R590 Localized enlarged lymph nodes: Secondary | ICD-10-CM

## 2020-11-15 DIAGNOSIS — E875 Hyperkalemia: Secondary | ICD-10-CM | POA: Diagnosis not present

## 2020-11-15 DIAGNOSIS — J9801 Acute bronchospasm: Secondary | ICD-10-CM | POA: Diagnosis not present

## 2020-11-15 DIAGNOSIS — Z4659 Encounter for fitting and adjustment of other gastrointestinal appliance and device: Secondary | ICD-10-CM

## 2020-11-15 DIAGNOSIS — E8881 Metabolic syndrome: Secondary | ICD-10-CM | POA: Diagnosis present

## 2020-11-15 DIAGNOSIS — Z823 Family history of stroke: Secondary | ICD-10-CM

## 2020-11-15 DIAGNOSIS — I10 Essential (primary) hypertension: Secondary | ICD-10-CM

## 2020-11-15 DIAGNOSIS — I2510 Atherosclerotic heart disease of native coronary artery without angina pectoris: Secondary | ICD-10-CM | POA: Diagnosis present

## 2020-11-15 DIAGNOSIS — I255 Ischemic cardiomyopathy: Secondary | ICD-10-CM | POA: Diagnosis present

## 2020-11-15 DIAGNOSIS — I071 Rheumatic tricuspid insufficiency: Secondary | ICD-10-CM | POA: Diagnosis present

## 2020-11-15 DIAGNOSIS — I5043 Acute on chronic combined systolic (congestive) and diastolic (congestive) heart failure: Secondary | ICD-10-CM | POA: Diagnosis present

## 2020-11-15 DIAGNOSIS — D860 Sarcoidosis of lung: Secondary | ICD-10-CM | POA: Diagnosis present

## 2020-11-15 DIAGNOSIS — Z6841 Body Mass Index (BMI) 40.0 and over, adult: Secondary | ICD-10-CM

## 2020-11-15 DIAGNOSIS — I5023 Acute on chronic systolic (congestive) heart failure: Secondary | ICD-10-CM | POA: Diagnosis present

## 2020-11-15 DIAGNOSIS — Z720 Tobacco use: Secondary | ICD-10-CM | POA: Diagnosis present

## 2020-11-15 DIAGNOSIS — D751 Secondary polycythemia: Secondary | ICD-10-CM | POA: Diagnosis present

## 2020-11-15 DIAGNOSIS — Z9119 Patient's noncompliance with other medical treatment and regimen: Secondary | ICD-10-CM

## 2020-11-15 DIAGNOSIS — R778 Other specified abnormalities of plasma proteins: Secondary | ICD-10-CM | POA: Diagnosis present

## 2020-11-15 DIAGNOSIS — I5021 Acute systolic (congestive) heart failure: Secondary | ICD-10-CM

## 2020-11-15 DIAGNOSIS — Z8249 Family history of ischemic heart disease and other diseases of the circulatory system: Secondary | ICD-10-CM

## 2020-11-15 DIAGNOSIS — Z95828 Presence of other vascular implants and grafts: Secondary | ICD-10-CM

## 2020-11-15 DIAGNOSIS — Z79899 Other long term (current) drug therapy: Secondary | ICD-10-CM

## 2020-11-15 DIAGNOSIS — E1165 Type 2 diabetes mellitus with hyperglycemia: Secondary | ICD-10-CM | POA: Diagnosis present

## 2020-11-15 DIAGNOSIS — I440 Atrioventricular block, first degree: Secondary | ICD-10-CM | POA: Diagnosis present

## 2020-11-15 DIAGNOSIS — Z9889 Other specified postprocedural states: Secondary | ICD-10-CM

## 2020-11-15 DIAGNOSIS — I447 Left bundle-branch block, unspecified: Secondary | ICD-10-CM | POA: Diagnosis present

## 2020-11-15 DIAGNOSIS — J9601 Acute respiratory failure with hypoxia: Secondary | ICD-10-CM | POA: Diagnosis present

## 2020-11-15 DIAGNOSIS — R9389 Abnormal findings on diagnostic imaging of other specified body structures: Secondary | ICD-10-CM

## 2020-11-15 DIAGNOSIS — Z9989 Dependence on other enabling machines and devices: Secondary | ICD-10-CM

## 2020-11-15 DIAGNOSIS — R748 Abnormal levels of other serum enzymes: Secondary | ICD-10-CM

## 2020-11-15 LAB — COMPREHENSIVE METABOLIC PANEL
ALT: 44 U/L (ref 0–44)
AST: 51 U/L — ABNORMAL HIGH (ref 15–41)
Albumin: 3.5 g/dL (ref 3.5–5.0)
Alkaline Phosphatase: 128 U/L — ABNORMAL HIGH (ref 38–126)
Anion gap: 6 (ref 5–15)
BUN: 16 mg/dL (ref 6–20)
CO2: 29 mmol/L (ref 22–32)
Calcium: 8.9 mg/dL (ref 8.9–10.3)
Chloride: 100 mmol/L (ref 98–111)
Creatinine, Ser: 0.71 mg/dL (ref 0.61–1.24)
GFR, Estimated: >60 mL/min (ref >60)
Glucose, Bld: 390 mg/dL — ABNORMAL HIGH (ref 70–99)
Potassium: 5.4 mmol/L — ABNORMAL HIGH (ref 3.5–5.1)
Sodium: 135 mmol/L (ref 135–145)
Total Bilirubin: 0.9 mg/dL (ref 0.3–1.2)
Total Protein: 7 g/dL (ref 6.5–8.1)

## 2020-11-15 LAB — URINALYSIS, ROUTINE W REFLEX MICROSCOPIC
Bacteria, UA: NONE SEEN
Bilirubin Urine: NEGATIVE
Glucose, UA: >=500 mg/dL — AB
Hgb urine dipstick: NEGATIVE
Ketones, ur: NEGATIVE mg/dL
Leukocytes,Ua: NEGATIVE
Nitrite: NEGATIVE
Protein, ur: 100 mg/dL — AB
Specific Gravity, Urine: 1.038 — ABNORMAL HIGH (ref 1.005–1.030)
pH: 6 (ref 5.0–8.0)

## 2020-11-15 LAB — CBC WITH DIFFERENTIAL/PLATELET
Abs Immature Granulocytes: 0.03 10*3/uL (ref 0.00–0.07)
Basophils Absolute: 0.1 10*3/uL (ref 0.0–0.1)
Basophils Relative: 1 %
Eosinophils Absolute: 0.4 10*3/uL (ref 0.0–0.5)
Eosinophils Relative: 5 %
HCT: 50.9 % (ref 39.0–52.0)
Hemoglobin: 16.5 g/dL (ref 13.0–17.0)
Immature Granulocytes: 0 %
Lymphocytes Relative: 20 %
Lymphs Abs: 1.6 10*3/uL (ref 0.7–4.0)
MCH: 27.9 pg (ref 26.0–34.0)
MCHC: 32.4 g/dL (ref 30.0–36.0)
MCV: 86 fL (ref 80.0–100.0)
Monocytes Absolute: 0.6 10*3/uL (ref 0.1–1.0)
Monocytes Relative: 8 %
Neutro Abs: 5.3 10*3/uL (ref 1.7–7.7)
Neutrophils Relative %: 66 %
Platelets: 229 10*3/uL (ref 150–400)
RBC: 5.92 MIL/uL — ABNORMAL HIGH (ref 4.22–5.81)
RDW: 13.7 % (ref 11.5–15.5)
WBC: 7.9 10*3/uL (ref 4.0–10.5)
nRBC: 0 % (ref 0.0–0.2)

## 2020-11-15 LAB — TROPONIN I (HIGH SENSITIVITY)
Troponin I (High Sensitivity): 35 ng/L — ABNORMAL HIGH (ref <18)
Troponin I (High Sensitivity): 30 ng/L — ABNORMAL HIGH (ref <18)

## 2020-11-15 LAB — RESP PANEL BY RT-PCR (FLU A&B, COVID) ARPGX2
Influenza A by PCR: NEGATIVE
Influenza B by PCR: NEGATIVE
SARS Coronavirus 2 by RT PCR: NEGATIVE

## 2020-11-15 LAB — LACTIC ACID, PLASMA: Lactic Acid, Venous: 1.4 mmol/L (ref 0.5–1.9)

## 2020-11-15 LAB — BRAIN NATRIURETIC PEPTIDE: B Natriuretic Peptide: 598.1 pg/mL — ABNORMAL HIGH (ref 0.0–100.0)

## 2020-11-15 MED ORDER — SODIUM CHLORIDE 0.9 % IV SOLN
1.0000 g | Freq: Once | INTRAVENOUS | Status: AC
  Administered 2020-11-15: 1 g via INTRAVENOUS
  Filled 2020-11-15: qty 10

## 2020-11-15 MED ORDER — METHYLPREDNISOLONE SODIUM SUCC 125 MG IJ SOLR
60.0000 mg | INTRAMUSCULAR | Status: AC
  Administered 2020-11-15: 60 mg via INTRAVENOUS
  Filled 2020-11-15: qty 2

## 2020-11-15 MED ORDER — IOHEXOL 350 MG/ML SOLN
100.0000 mL | Freq: Once | INTRAVENOUS | Status: AC | PRN
  Administered 2020-11-15: 100 mL via INTRAVENOUS

## 2020-11-15 MED ORDER — FUROSEMIDE 10 MG/ML IJ SOLN
60.0000 mg | Freq: Once | INTRAMUSCULAR | Status: AC
  Administered 2020-11-15: 60 mg via INTRAVENOUS
  Filled 2020-11-15: qty 8

## 2020-11-15 MED ORDER — DOXYCYCLINE HYCLATE 100 MG PO TABS
100.0000 mg | ORAL_TABLET | Freq: Once | ORAL | Status: AC
  Administered 2020-11-15: 100 mg via ORAL
  Filled 2020-11-15: qty 1

## 2020-11-15 MED ORDER — INSULIN ASPART 100 UNIT/ML IJ SOLN
0.0000 [IU] | INTRAMUSCULAR | Status: DC
  Administered 2020-11-16: 5 [IU] via SUBCUTANEOUS
  Administered 2020-11-16: 15 [IU] via SUBCUTANEOUS
  Administered 2020-11-16: 11 [IU] via SUBCUTANEOUS
  Administered 2020-11-16: 15 [IU] via SUBCUTANEOUS
  Administered 2020-11-16: 8 [IU] via SUBCUTANEOUS
  Administered 2020-11-16: 15 [IU] via SUBCUTANEOUS
  Administered 2020-11-17 (×2): 3 [IU] via SUBCUTANEOUS
  Administered 2020-11-17: 8 [IU] via SUBCUTANEOUS
  Administered 2020-11-17: 3 [IU] via SUBCUTANEOUS
  Administered 2020-11-17: 5 [IU] via SUBCUTANEOUS
  Administered 2020-11-17 – 2020-11-18 (×3): 8 [IU] via SUBCUTANEOUS
  Administered 2020-11-18: 5 [IU] via SUBCUTANEOUS
  Administered 2020-11-18: 2 [IU] via SUBCUTANEOUS
  Administered 2020-11-18: 5 [IU] via SUBCUTANEOUS
  Administered 2020-11-19: 8 [IU] via SUBCUTANEOUS
  Administered 2020-11-19 (×3): 3 [IU] via SUBCUTANEOUS
  Administered 2020-11-19 (×2): 2 [IU] via SUBCUTANEOUS
  Administered 2020-11-20: 5 [IU] via SUBCUTANEOUS
  Administered 2020-11-20: 3 [IU] via SUBCUTANEOUS
  Administered 2020-11-20: 8 [IU] via SUBCUTANEOUS
  Administered 2020-11-20: 3 [IU] via SUBCUTANEOUS
  Administered 2020-11-21: 2 [IU] via SUBCUTANEOUS
  Administered 2020-11-21: 5 [IU] via SUBCUTANEOUS
  Filled 2020-11-15: qty 0.15

## 2020-11-15 MED ORDER — CEFTRIAXONE SODIUM 2 G IJ SOLR
2.0000 g | INTRAMUSCULAR | Status: DC
  Administered 2020-11-16: 2 g via INTRAVENOUS
  Filled 2020-11-15: qty 20

## 2020-11-15 MED ORDER — DOXYCYCLINE HYCLATE 100 MG PO TABS
100.0000 mg | ORAL_TABLET | Freq: Two times a day (BID) | ORAL | Status: DC
  Administered 2020-11-16 – 2020-11-18 (×5): 100 mg via ORAL
  Filled 2020-11-15 (×5): qty 1

## 2020-11-15 MED ORDER — HYDRALAZINE HCL 10 MG PO TABS
10.0000 mg | ORAL_TABLET | Freq: Three times a day (TID) | ORAL | Status: DC
  Administered 2020-11-15 – 2020-11-16 (×2): 10 mg via ORAL
  Filled 2020-11-15 (×2): qty 1

## 2020-11-15 NOTE — ED Provider Notes (Signed)
[facility]-EMERGENCY DEPT Provider Note   CSN: [id number] Arrival date & time: 11/15/20  1414     History Chief Complaint  Patient presents with  . Shortness of Breath    Marc Wright is a 45 y.o. male with history of tobacco use, diabetes, asthma, tobacco, obesity, heart failure with reduced ejection fraction EF 25 to 30%, sleep apnea presents to the ED for evaluation of body aches, subjective fevers, productive cough with pink/gold sputum, sore throat that he attributes to coughing, fatigue, diarrhea, shortness of breath and chest pain that began 4 days ago.  Reports a lot of chest pain because it is hard to breathe and it hurts with breathing.  Feels like his chest is restricted and cannot get a deep breath in.  Also feels like his nasal congestion makes his breathing harder and makes him more short of breath.  Chest pain and shortness of breath are worse with moving around.  Has taken ibuprofen intermittently for the body aches.  He went to get tested for COVID yesterday but does not have the results of this.  Patient states he has not had insurance for the last few months and has not been taking his medicines like he should.  States he "should be" on Lasix but has not been taking it.  He took a couple of doses of Lasix this week because he thought he might be getting fluid buildup in his lungs.  Has also noticed both of his leg swelling, left greater than the right.  Has been feeling more comfortable with sitting up.  Has never been able to sleep on his back and usually just sleeps on his side or stomach.  Does not know if he has sleep apnea or if he needs a CPAP, he is not wearing one at nights.  States he has had pneumonia 3 times in the last 4 years.  He received his COVID vaccines and booster 4 months ago.  Thinks he was around somebody that sounded sick but is unsure if they had COVID.  Continues to smoke cigarettes, 1/2 pack a day.  States he is not on diabetes  medicines because he has never been officially diagnosed.  Denies vomiting, abdominal pain, hemoptysis.   HPI     Past Medical History:  Diagnosis Date  . Asthma   . CHF (congestive heart failure) (HCC)   . Diabetes mellitus without complication (HCC)   . OSA on CPAP   . Pneumonia 06/2016    Patient Active Problem List   Diagnosis Date Noted  . Uncontrolled type 2 diabetes mellitus with hyperglycemia (HCC) 11/26/2018  . Venous stasis dermatitis of both lower extremities 11/26/2018  . Morbid obesity with BMI of 45.0-49.9, adult (HCC) 11/26/2018  . Mild intermittent asthma without complication 11/26/2018  . Cellulitis 11/09/2018  . Lymphadenopathy 11/07/2018  . Dehydration 11/06/2018  . Hyperglycemia 11/06/2018  . Sepsis (HCC) 11/06/2018  . CAP (community acquired pneumonia) 11/06/2018  . Chronic systolic heart failure (HCC) 07/23/2016  . HTN (hypertension) 07/23/2016  . Obstructive sleep apnea on CPAP 07/23/2016  . Tobacco use 07/23/2016  . Diabetes (HCC) 07/23/2016    Past Surgical History:  Procedure Laterality Date  . I & D EXTREMITY    . SKIN GRAFT         Family History  Problem Relation Age of Onset  . Congestive Heart Failure Mother   . Hypertension Mother   . Stroke Father        2017  .  Asthma Father     Social History   Tobacco Use  . Smoking status: Current Every Day Smoker    Packs/day: 0.75    Types: Cigarettes    Start date: 11/23/2016  . Smokeless tobacco: Never Used  Vaping Use  . Vaping Use: Former  . Start date: 11/23/2016  Substance Use Topics  . Alcohol use: Yes    Comment: occasional   . Drug use: No    Home Medications Prior to Admission medications   Medication Sig Start Date End Date Taking? Authorizing Provider  cetirizine (ZYRTEC) 10 MG tablet Take 10 mg by mouth daily.   Yes [provider]  ibuprofen (ADVIL) 200 MG tablet Take 200 mg by mouth every 6 (six) hours as needed for moderate pain.   Yes [provider]  carvedilol (COREG) 12.5 MG tablet Take 1 tablet (12.5 mg total) by mouth 2 (two) times daily with a meal. Patient not taking: No sig reported 02/21/19   Clarisa Kindred A, FNP  fexofenadine (ALLEGRA) 180 MG tablet Take 1 tablet (180 mg total) by mouth daily. Taking as needed Patient not taking: No sig reported 11/11/18   Alwyn Ren, MD  furosemide (LASIX) 80 MG tablet Take 1 tablet (80 mg total) by mouth 2 (two) times daily. Patient not taking: No sig reported 02/21/19   Clarisa Kindred A, FNP  HYDROcodone-acetaminophen (NORCO/VICODIN) 5-325 MG tablet Take 2 tablets by mouth every 6 (six) hours as needed. Patient not taking: No sig reported 06/22/20   Ward, Baxter Hire N, DO  lisinopril (ZESTRIL) 5 MG tablet Take 1 tablet (5 mg total) by mouth daily. Patient not taking: No sig reported 02/21/19   Clarisa Kindred A, FNP  metFORMIN (GLUCOPHAGE) 500 MG tablet Take 1 tablet (500 mg total) by mouth 2 (two) times daily with a meal. Patient not taking: No sig reported 03/21/19   Clarisa Kindred A, FNP  ondansetron (ZOFRAN ODT) 4 MG disintegrating tablet Take 1 tablet (4 mg total) by mouth every 8 (eight) hours as needed for nausea or vomiting. Patient not taking: No sig reported 06/22/20   Ward, Layla Maw, DO    Allergies    Azithromycin  Review of Systems   Review of Systems  Constitutional: Positive for fatigue and fever.  HENT: Positive for congestion and sore throat.   Respiratory: Positive for cough, chest tightness and shortness of breath.   Cardiovascular: Positive for chest pain and leg swelling.  Gastrointestinal: Positive for diarrhea.  Musculoskeletal: Positive for myalgias.  All other systems reviewed and are negative.   Physical Exam Updated Vital Signs BP (!) 160/120   Pulse (!) 102   Temp 98.1 F (36.7 C) (Oral)   Resp (!) 29   SpO2 93%   Physical Exam Constitutional:      Appearance: He is well-developed.     Comments: Forehead diaphoresis   HENT:      Head: Normocephalic and atraumatic.     Nose: Nose normal.     Mouth/Throat:     Pharynx: Posterior oropharyngeal erythema present.     Comments: No tonsil hypertrophy, exudates. Uvula midline. Tolerating secretions. Normal SL space Eyes:     General: Lids are normal.     Conjunctiva/sclera: Conjunctivae normal.  Neck:     Trachea: Trachea normal.     Comments: Trachea midline.  Cardiovascular:     Rate and Rhythm: Regular rhythm. Tachycardia present.     Pulses:  Radial pulses are 1+ on the right side and 1+ on the left side.       Dorsalis pedis pulses are 1+ on the right side and 1+ on the left side.     Heart sounds: S1 normal and S2 normal. Murmur heard.      Comments: 2+ non pitting edema knees down, symmetric. No calf tenderness. Venous stasis skin changes noted. Questionable murmur, systolic?. HR 110s Pulmonary:     Effort: Pulmonary effort is normal. Tachypnea present.     Breath sounds: Examination of the left-middle field reveals rhonchi. Examination of the right-lower field reveals decreased breath sounds. Examination of the left-lower field reveals decreased breath sounds. Decreased breath sounds and rhonchi present.     Comments: Speaking in full sentences, tachypnic.  Subtle rhonchi, decreased BS bibasilar. SpO2 91-93%  Abdominal:     General: Bowel sounds are normal.     Palpations: Abdomen is soft.     Tenderness: There is no abdominal tenderness.  Musculoskeletal:     Cervical back: Normal range of motion.     Right lower leg: Edema present.     Left lower leg: Edema present.  Skin:    General: Skin is warm and dry.     Capillary Refill: Capillary refill takes less than 2 seconds.  Neurological:     Mental Status: He is alert.     GCS: GCS eye subscore is 4. GCS verbal subscore is 5. GCS motor subscore is 6.     Comments: Sensation and strength intact in upper/lower extremities  Psychiatric:        Speech: Speech normal.        Behavior: Behavior  normal.        Thought Content: Thought content normal.     ED Results / Procedures / Treatments   Labs (all labs ordered are listed, but only abnormal results are displayed) Labs Reviewed  BRAIN NATRIURETIC PEPTIDE - Abnormal; Notable for the following components:      Result Value   B Natriuretic Peptide 598.1 (*)    All other components within normal limits  COMPREHENSIVE METABOLIC PANEL - Abnormal; Notable for the following components:   Potassium 5.4 (*)    Glucose, Bld 390 (*)    AST 51 (*)    Alkaline Phosphatase 128 (*)    All other components within normal limits  CBC WITH DIFFERENTIAL/PLATELET - Abnormal; Notable for the following components:   RBC 5.92 (*)    All other components within normal limits  TROPONIN I (HIGH SENSITIVITY) - Abnormal; Notable for the following components:   Troponin I (High Sensitivity) 35 (*)    All other components within normal limits  TROPONIN I (HIGH SENSITIVITY) - Abnormal; Notable for the following components:   Troponin I (High Sensitivity) 30 (*)    All other components within normal limits  RESP PANEL BY RT-PCR (FLU A&B, COVID) ARPGX2  LACTIC ACID, PLASMA  LACTIC ACID, PLASMA    EKG EKG Interpretation  Date/Time:  Thursday Nov 15 2020 16:19:41 EDT Ventricular Rate:  107 PR Interval:  53 QRS Duration: 183 QT Interval:  424 QTC Calculation: 566 R Axis:   167 Text Interpretation: Sinus tachycardia Right atrial enlargement Nonspecific intraventricular conduction delay Since last tracing QRS duration has lengthened Confirmed by Susy Frizzle [id number]) on 11/15/2020 4:30:26 PM   Radiology CT Angio Chest PE W and/or Wo Contrast  Result Date: 11/15/2020 CLINICAL DATA:  Abnormal chest x-ray, cough, fever EXAM: CT ANGIOGRAPHY CHEST WITH  CONTRAST TECHNIQUE: Multidetector CT imaging of the chest was performed using the standard protocol during bolus administration of intravenous contrast. Multiplanar CT image reconstructions and MIPs  were obtained to evaluate the vascular anatomy. CONTRAST:  OMNIPAQUE IOHEXOL 350 MG/ML SOLN COMPARISON:  11/06/2018 FINDINGS: Cardiovascular: There is adequate opacification of the pulmonary arterial tree. No intraluminal filling defect identified to suggest acute pulmonary embolism. The central pulmonary arteries are of normal caliber. Mild coronary artery calcification. There is mild global cardiomegaly with left ventricular dilation noted. No pericardial effusion. The thoracic aorta is unremarkable. Mediastinum/Nodes: There is extensive mediastinal and bilateral hilar adenopathy present, possibly reactive or inflammatory in nature. This appears similar to that noted on prior examination. Thyroid unremarkable. Esophagus unremarkable. Lungs/Pleura: There are multiple regions of pulmonary consolidation involving the superior segments of the lower lobes bilaterally, the central right middle lobe, and peripherally within the left upper lobe. This appears similar, though progressive since prior examination both in appearance and location. Small right pleural effusion is present. No pneumothorax. Central airways are widely patent. Upper Abdomen: No acute abnormality. Specifically, the spleen does not appear enlarged. Musculoskeletal: No lytic or blastic bone lesion is identified. No acute bone abnormality. Review of the MIP images confirms the above findings. IMPRESSION: No pulmonary embolism. Multifocal pulmonary consolidation similar in appearance though progressive since remote prior examination of 11/06/2018, with extensive mediastinal and bilateral hilar adenopathy. Given its chronicity, differential considerations are led by granulomatous and immunologic conditions such as sarcoidosis or granulomatosis with polyangiitis. Progressive cardiomegaly now with left ventricular dilation. Given the above findings, cardiac sarcoidosis could be considered. Electronically Signed   By: Helyn Numbers MD   On: 11/15/2020  21:28   DG Chest Port 1 View  Result Date: 11/15/2020 CLINICAL DATA:  Shortness of breath with cough and fever. EXAM: PORTABLE CHEST 1 VIEW COMPARISON:  11/06/2018 FINDINGS: 1657 hours. Low lung volumes. The cardio pericardial silhouette is enlarged. There is pulmonary vascular congestion without overt pulmonary edema. Right hilar and infrahilar opacity noted, progressive in the interval. Telemetry leads overlie the chest. IMPRESSION: 1. Low lung volumes with pulmonary vascular congestion. Component of interstitial edema not excluded. 2. Progressive opacity in the right hilar and infrahilar region. All imaging features may be related to infectious/inflammatory etiology, neoplasm could have this appearance. CT chest with contrast recommended to further evaluate. Electronically Signed   By: Marc Center M.D.   On: 11/15/2020 17:21    Procedures .Critical Care Performed by: Liberty Handy, PA-C Authorized by: Liberty Handy, PA-C   Critical care provider statement:    Critical care time (minutes):  45   Critical care was necessary to treat or prevent imminent or life-threatening deterioration of the following conditions:  Respiratory failure   Critical care was time spent personally by me on the following activities:  Discussions with consultants, evaluation of patient's response to treatment, examination of patient, ordering and performing treatments and interventions, ordering and review of laboratory studies, ordering and review of radiographic studies, pulse oximetry, re-evaluation of patient's condition, obtaining history from patient or surrogate, review of old charts and development of treatment plan with patient or surrogate   I assumed direction of critical care for this patient from another provider in my specialty: no       Medications Ordered in ED Medications  furosemide (LASIX) injection 60 mg (60 mg Intravenous Given 11/15/20 1922)  cefTRIAXone (ROCEPHIN) 1 g in sodium  chloride 0.9 % 100 mL IVPB (0 g Intravenous Stopped 11/15/20 2159)  doxycycline (VIBRA-TABS) tablet 100 mg (100 mg Oral Given 11/15/20 1921)  iohexol (OMNIPAQUE) 350 MG/ML injection 100 mL (100 mLs Intravenous Contrast Given 11/15/20 2058)    ED Course  I have reviewed the triage vital signs and the nursing notes.  Pertinent labs & imaging results that were available during my care of the patient were reviewed by me and considered in my medical decision making (see chart for details).  Clinical Course as of 11/15/20 2214  Thu Nov 15, 2020  1642 ED EKG Sinus tachycardia 107 Right atrial enlargement Nonspecific intraventricular conduction delay Since last tracing QRS duration has lengthened 566 Confirmed by Susy Frizzle [phone]) on 11/15/2020 4:30:26 PM [CG]  1837 DG Chest Port 1 View  IMPRESSION: 1. Low lung volumes with pulmonary vascular congestion. Component of interstitial edema not excluded. 2. Progressive opacity in the right hilar and infrahilar region. All imaging features may be related to infectious/inflammatory etiology, neoplasm could have this appearance. CT chest with contrast recommended to further evaluate. [CG]  2146 CT Angio Chest PE W and/or Wo Contrast [CG]  2147 CT Angio Chest PE W and/or Wo Contrast IMPRESSION: No pulmonary embolism.  Multifocal pulmonary consolidation similar in appearance though progressive since remote prior examination of 11/06/2018, with extensive mediastinal and bilateral hilar adenopathy. Given its chronicity, differential considerations are led by granulomatous and immunologic conditions such as sarcoidosis or granulomatosis with polyangiitis.  Progressive cardiomegaly now with left ventricular dilation. Given the above findings, cardiac sarcoidosis could be considered [CG]    Clinical Course User Index [CG] Liberty Handy, PA-C   MDM Rules/Calculators/A&P                           45 y.o. yo male presents to the ED for  subjective fever, productive cough, CP, Sob, sore throat, fatigue, diarrhea, congestion.   Additional information obtained from chart, nursing and triage notes review  Chart review reveals - last seen by cardiology 04/2019 EF 25% on echo.  Ordered lab, imaging were personally reviewed and interpreted  Labs reveal - Trop 35 > 30. BNP 598. Normal WBC, lactic acid.  RVP negative.   Imaging reveals - CXR with pulmonary vascular congestion, edema and progressive opacity in R hilar region. CTA obtained to better evaluate - shows multifocal consolidations progressive from previous CT on 2020, extensive mediastinal/hilar adenopath. Findings concerning for granulomatosis vs sarcoidosis.  No PE.   Medicines ordered - lasix, rocephin, doxycycline   ED course & MDM 2213: Patient reevaluated.  Reports some improvement in breathing after receiving Lasix he has put out more than 1 L of urine.  I discussed plan to admit and he is in agreement.  Overall presentation is most suggestive of acute CHF exacerbation.  Will consult medicine for admission.  Will need cardiology consult for possible sarcoid/granulomatosis.  Discussed with EDP.  Portions of this note were generated with Scientist, clinical (histocompatibility and immunogenetics). Dictation errors may occur despite best attempts at proofreading    Final Clinical Impression(s) / ED Diagnoses Final diagnoses:  Hypoxia    Rx / DC Orders ED Discharge Orders    None       Jerrell Mylar 11/15/20 2214    Pollyann Savoy, MD 11/15/20 2235

## 2020-11-15 NOTE — ED Triage Notes (Addendum)
Pt reports shortness of breath, cough, fever, body aches x 1 week. He has had COVID vaccine + booster. No known sick contacts. Took ibuprofen 1 hour prior to arrival. Hx of CHF.

## 2020-11-15 NOTE — Progress Notes (Signed)
Contacted by Dr. Adela Glimpse, admitting hospitalist at [facility] regarding Mr. Marc Wright.  45 year old male with history of dilated cardiomyopathy, unclear etiology presumed nonischemic, presented to ED with AHRF, hypertension, and volume overload.  Responded favorably to diuretics with improvement in respiratory status.  On chart review he has multiple admissions for acute respiratory failure managed as multifactorial COPD/pneumonia/CHF exacerbation.  Remains hypertensive on 2 liters of oxgen.  Objective data reviewed.  ECG shows sinus tachycardia with progressive AV block and non-specific intraventricular conduction delay.  Normal lactic acid with stable renal function and elevated BNP.  Troponin mildly elevated at 30 and flat.  CT scan shows hilar lymphadenopathy with venous congestion and multifocal infiltrates concerning for granulomatous disease, possible sarcoid.    Agree that dilated, presumed NICM, progressive conduction delays, and coinciding CT chest findings this could be consistent with sarcoidosis with cardiac involvement.  Recommend transfer for management here at medical center.  We ywill evaluate in consult on his arrival.  Recommend pulmonary consultation re possible coinciding pulmonary sarcoidosis.  In the meanwhile, agree with IV diuretics to relieve dyspnea and this should help with blood pressure.  Start vasodilators (hydralazine 12.5 mg q6h) and increase as tolerated.  TTE in morning.  Check urine drug screen.  Will advise on starting GDMT once he's evaluated in person.  If worsening dyspnea and blood pressure remains elevated, can start nitroglycerin infusion for blood pressure and symptomatic relief.  Luanna Salk, MD Cardiology Fellow [facility]

## 2020-11-15 NOTE — ED Notes (Signed)
Sharen Heck, PA aware pt HR 120-145. PA aware pt on 2L O2 [place]. Pt O2 dropped to 86% RA with movement while pt was speaking.

## 2020-11-15 NOTE — H&P (Signed)
Marc Wright ZOX:[id number] DOB: [birth year] DOA: 11/15/2020     PCP: Patient, No Pcp Per (Inactive)   Outpatient Specialists:   Marc Neighbors NP   Patient arrived to ER on 11/15/20 at 1414 Referred by Attending Marc Savoy, MD   Patient coming from: home Lives alone,        Chief Complaint:  Chief Complaint  Patient presents with  . Shortness of Breath    HPI: Marc Wright is a 45 y.o. male with medical history significant of chronic systolic CHF  HTN, OSA on CPAP, DM2, tobacco abuse  Presented with cough fever body aches for 1 week Known history of CHF with EF of 25 to 30%. Chest pain with breathing.  Feels like he cannot take a deep breath increased nasal congestion. He has not been taking his medications as he supposed to.  Supposed to be on Lasix but not taking it.  Try to take some Lasix this week because he thought maybe it was because he was rechecked.  He noticed that his legs been swelling left greater than right.  He is supposed to use a CPAP but not wearing a night.  He continues to smoke. Drinks about 3-4 drinks per week occasional THC remote cocaine but nothing in the past few years  Infectious risk factors:  Reports subjective  fever, shortness of breath, dry cough, chest pain,  severe fatigue     Has   been vaccinated against COVID    Initial COVID TEST  NEGATIVE   Lab Results  Component Value Date   SARSCOV2NAA NEGATIVE 11/15/2020   SARSCOV2NAA NOT DETECTED 11/07/2018   SARSCOV2NAA NEGATIVE 11/07/2018   SARSCOV2NAA NEGATIVE 11/06/2018     Regarding pertinent Chronic problems:       HTN on supposed to be on Coreg Lasix lisinopril   chronic CHF  systolic/ combined - last echo 2018 Supposed to be on Lasix noncompliant    DM 2 -  Lab Results  Component Value Date   HGBA1C 13.3 (H) 11/07/2018    diet controlled supposed to be on the     Morbid obesity-   BMI Readings from Last 1 Encounters:  06/21/20 49.75 kg/m      OSA -  noncompliant with CPAP   While in ER: Tachypneic satting 91% on room air Noted to have elevated troponin up to 15 elevated BNP. EKG showed prolonged QRS When tried to ambulate patient's heart rate went up to 120/145 and oxygen dropped to 86% on room air while he was just talking.  He started on 2 L   CT scan showing multifocal pulmonary consolidation.  Mediastinal bilateral hilar adenopathy sarcoidosis versus polyangiitis with now progressive cardiomegaly  Chest x-ray abnormal as well Initially hypertensive up to 227/136  ED Triage Vitals  Enc Vitals Group     BP 11/15/20 1421 (!) 227/136     Pulse Rate 11/15/20 1421 (!) 111     Resp 11/15/20 1421 20     Temp 11/15/20 1421 98.1 F (36.7 C)     Temp Source 11/15/20 1421 Oral     SpO2 11/15/20 1421 94 %     Weight --      Height --      Head Circumference --      Peak Flow --      Pain Score 11/15/20 1423 0     Pain Loc --      Pain Edu? --      Excl.  in GC? --   TMAX(24)@     _________________________________________ Significant initial  Findings: Abnormal Labs Reviewed  BRAIN NATRIURETIC PEPTIDE - Abnormal; Notable for the following components:      Result Value   B Natriuretic Peptide 598.1 (*)    All other components within normal limits  COMPREHENSIVE METABOLIC PANEL - Abnormal; Notable for the following components:   Potassium 5.4 (*)    Glucose, Bld 390 (*)    AST 51 (*)    Alkaline Phosphatase 128 (*)    All other components within normal limits  CBC WITH DIFFERENTIAL/PLATELET - Abnormal; Notable for the following components:   RBC 5.92 (*)    All other components within normal limits  TROPONIN I (HIGH SENSITIVITY) - Abnormal; Notable for the following components:   Troponin I (High Sensitivity) 35 (*)    All other components within normal limits  TROPONIN I (HIGH SENSITIVITY) - Abnormal; Notable for the following components:   Troponin I (High Sensitivity) 30 (*)    All other components within normal limits    ____________________________________________ Ordered    CXR - Progressive opacity in the right hilar and infrahilar region.    CTA chest -  no PE,  evidence of infiltrate   _________________________ Troponin 30 - 35  ECG: Ordered Personally reviewed by me showing: HR : 104 Rhythm:   Sinus tachycardia Nonspecific intraventricular conduction delay  nonspecific changes  QTC 582 ___________  The recent clinical data is shown below. Vitals:   11/15/20 2115 11/15/20 2154 11/15/20 2155 11/15/20 2159  BP: 101/64 (!) 160/120 (!) 173/124 (!) 160/120  Pulse: (!) 103  100 (!) 102  Resp: (!) 45  (!) 30 (!) 29  Temp:      TempSrc:      SpO2: 96%  92% 93%     WBC     Component Value Date/Time   WBC 7.9 11/15/2020 1556   LYMPHSABS 1.6 11/15/2020 1556   LYMPHSABS 2.0 11/24/2018 1204   MONOABS 0.6 11/15/2020 1556   EOSABS 0.4 11/15/2020 1556   EOSABS 0.4 11/24/2018 1204   BASOSABS 0.1 11/15/2020 1556   BASOSABS 0.1 11/24/2018 1204    Lactic Acid, Venous    Component Value Date/Time   LATICACIDVEN 1.4 11/15/2020 1632     Procalcitonin  Ordered      UA   no evidence of UTI      Urine analysis:    Component Value Date/Time   COLORURINE YELLOW 11/15/2020 2259   APPEARANCEUR CLEAR 11/15/2020 2259   LABSPEC 1.038 (H) 11/15/2020 2259   PHURINE 6.0 11/15/2020 2259   GLUCOSEU >=500 (A) 11/15/2020 2259   HGBUR NEGATIVE 11/15/2020 2259   BILIRUBINUR NEGATIVE 11/15/2020 2259   KETONESUR NEGATIVE 11/15/2020 2259   PROTEINUR 100 (A) 11/15/2020 2259   NITRITE NEGATIVE 11/15/2020 2259   LEUKOCYTESUR NEGATIVE 11/15/2020 2259    Results for orders placed or performed during the hospital encounter of 11/15/20  Resp Panel by RT-PCR (Flu A&B, Covid) Nasopharyngeal Swab     Status: None   Collection Time: 11/15/20  3:57 PM   Specimen: Nasopharyngeal Swab; Nasopharyngeal(NP) swabs in vial transport medium  Result Value Ref Range Status   SARS Coronavirus 2 by RT PCR NEGATIVE  NEGATIVE Final         Influenza A by PCR NEGATIVE NEGATIVE Final   Influenza B by PCR NEGATIVE NEGATIVE Final          _______________________________________________ Hospitalist was called for admission for CHF exacerbation possible cardiac  sarcoidosis  The following Work up has been ordered so far:  Orders Placed This Encounter  Procedures  . Resp Panel by RT-PCR (Flu A&B, Covid) Nasopharyngeal Swab  . DG Chest Port 1 View  . CT Angio Chest PE W and/or Wo Contrast  . Brain natriuretic peptide  . Comprehensive metabolic panel  . CBC with Differential/Platelet  . Lactic acid, plasma  . Cardiac monitoring  . Consult to hospitalist  . Airborne and Contact precautions  . Pulse oximetry (single)  . ED EKG  . ED EKG  . Insert peripheral IV    Following Medications were ordered in ER: Medications  furosemide (LASIX) injection 60 mg (60 mg Intravenous Given 11/15/20 1922)  cefTRIAXone (ROCEPHIN) 1 g in sodium chloride 0.9 % 100 mL IVPB (0 g Intravenous Stopped 11/15/20 2159)  doxycycline (VIBRA-TABS) tablet 100 mg (100 mg Oral Given 11/15/20 1921)  iohexol (OMNIPAQUE) 350 MG/ML injection 100 mL (100 mLs Intravenous Contrast Given 11/15/20 2058)        Consult Orders  (From admission, onward)         Start     Ordered   11/15/20 2151  Consult to hospitalist  Once       Provider:  (Not yet assigned)  Question Answer Comment  Place call to: [facility]   Reason for Consult Admit      11/15/20 2150            OTHER Significant initial  Findings:  labs showing:    Recent Labs  Lab 11/15/20 1556  NA 135  K 5.4*  CO2 29  GLUCOSE 390*  BUN 16  CREATININE 0.71  CALCIUM 8.9    Cr   stable,   Lab Results  Component Value Date   CREATININE 0.71 11/15/2020   CREATININE 0.73 12/23/2018   CREATININE 0.76 11/24/2018    Recent Labs  Lab 11/15/20 1556  AST 51*  ALT 44  ALKPHOS 128*  BILITOT 0.9  PROT 7.0  ALBUMIN 3.5   Lab Results  Component Value  Date   CALCIUM 8.9 11/15/2020   PHOS 3.1 11/07/2018   Plt: Lab Results  Component Value Date   PLT 229 11/15/2020       Venous  Blood Gas result: ordered    Recent Labs  Lab 11/15/20 1556  WBC 7.9  NEUTROABS 5.3  HGB 16.5  HCT 50.9  MCV 86.0  PLT 229    HG/HCT  Stable,     Component Value Date/Time   HGB 16.5 11/15/2020 1556   HGB 15.6 11/24/2018 1204   HCT 50.9 11/15/2020 1556   HCT 48.9 11/24/2018 1204   MCV 86.0 11/15/2020 1556   MCV 88 11/24/2018 1204      Cardiac Panel (last 3 results) No results for input(s): CKTOTAL, CKMB, TROPONINI, RELINDX in the last 72 hours.   BNP (last 3 results) Recent Labs    11/15/20 1556  BNP 598.1*   DM  labs:  HbA1C: No results for input(s): HGBA1C in the last 8760 hours.     CBG (last 3)  No results for input(s): GLUCAP in the last 72 hours.        Cultures:    Component Value Date/Time   SDES SPUTUM 11/07/2018 1255   SDES  11/07/2018 1255    SPUTUM Performed at [facility], [address], [place], [place] [address]    SPECREQUEST Normal 11/07/2018 1255   SPECREQUEST  11/07/2018 1255    Normal Reflexed  from [id number] Performed at [facility], [address], [place], [place] [address]    CULT  11/07/2018 1255    FEW Consistent with normal respiratory flora. Performed at [facility], [address] [place], [place] [address]    REPTSTATUS 11/07/2018 FINAL 11/07/2018 1255   REPTSTATUS 11/09/2018 FINAL 11/07/2018 1255     Radiological Exams on Admission: CT Angio Chest PE W and/or Wo Contrast  Result Date: 11/15/2020 CLINICAL DATA:  Abnormal chest x-ray, cough, fever EXAM: CT ANGIOGRAPHY CHEST WITH CONTRAST TECHNIQUE: Multidetector CT imaging of the chest was performed using the standard protocol during bolus administration of intravenous contrast. Multiplanar CT image reconstructions and MIPs were obtained to evaluate the vascular anatomy. CONTRAST:   OMNIPAQUE IOHEXOL 350 MG/ML SOLN COMPARISON:  11/06/2018 FINDINGS: Cardiovascular: There is adequate opacification of the pulmonary arterial tree. No intraluminal filling defect identified to suggest acute pulmonary embolism. The central pulmonary arteries are of normal caliber. Mild coronary artery calcification. There is mild global cardiomegaly with left ventricular dilation noted. No pericardial effusion. The thoracic aorta is unremarkable. Mediastinum/Nodes: There is extensive mediastinal and bilateral hilar adenopathy present, possibly reactive or inflammatory in nature. This appears similar to that noted on prior examination. Thyroid unremarkable. Esophagus unremarkable. Lungs/Pleura: There are multiple regions of pulmonary consolidation involving the superior segments of the lower lobes bilaterally, the central right middle lobe, and peripherally within the left upper lobe. This appears similar, though progressive since prior examination both in appearance and location. Small right pleural effusion is present. No pneumothorax. Central airways are widely patent. Upper Abdomen: No acute abnormality. Specifically, the spleen does not appear enlarged. Musculoskeletal: No lytic or blastic bone lesion is identified. No acute bone abnormality. Review of the MIP images confirms the above findings. IMPRESSION: No pulmonary embolism. Multifocal pulmonary consolidation similar in appearance though progressive since remote prior examination of 11/06/2018, with extensive mediastinal and bilateral hilar adenopathy. Given its chronicity, differential considerations are led by granulomatous and immunologic conditions such as sarcoidosis or granulomatosis with polyangiitis. Progressive cardiomegaly now with left ventricular dilation. Given the above findings, cardiac sarcoidosis could be considered. Electronically Signed   By: Helyn Numbers MD   On: 11/15/2020 21:28   DG Chest Port 1 View  Result Date:  11/15/2020 CLINICAL DATA:  Shortness of breath with cough and fever. EXAM: PORTABLE CHEST 1 VIEW COMPARISON:  11/06/2018 FINDINGS: 1657 hours. Low lung volumes. The cardio pericardial silhouette is enlarged. There is pulmonary vascular congestion without overt pulmonary edema. Right hilar and infrahilar opacity noted, progressive in the interval. Telemetry leads overlie the chest. IMPRESSION: 1. Low lung volumes with pulmonary vascular congestion. Component of interstitial edema not excluded. 2. Progressive opacity in the right hilar and infrahilar region. All imaging features may be related to infectious/inflammatory etiology, neoplasm could have this appearance. CT chest with contrast recommended to further evaluate. Electronically Signed   By: Kennith Center M.D.   On: 11/15/2020 17:21   _______________________________________________________________________________________________________ Latest  Blood pressure (!) 160/120, pulse (!) 102, temperature 98.1 F (36.7 C), temperature source Oral, resp. rate (!) 29, SpO2 93 %.   Review of Systems:    Pertinent positives include:   chills, fatigue,  shortness of breath at rest.   dyspnea on exertion, Bilateral lower extremity swelling   Constitutional:  No weight loss, night sweats, Fevers,  weight loss  HEENT:  No headaches, Difficulty swallowing,Tooth/dental problems,Sore throat,  No sneezing, itching, ear ache, nasal congestion, post nasal drip,  Cardio-vascular:  No chest pain, Orthopnea, PND, anasarca, dizziness, palpitations.no  GI:  No heartburn, indigestion, abdominal pain, nausea, vomiting, diarrhea, change in bowel habits, loss of appetite, melena, blood in stool, hematemesis Resp:  no No excess mucus, no productive cough, No non-productive cough, No coughing up of blood.No change in color of mucus.No wheezing. Skin:  no rash or lesions. No jaundice GU:  no dysuria, change in color of urine, no urgency or frequency. No straining to  urinate.  No flank pain.  Musculoskeletal:  No joint pain or no joint swelling. No decreased range of motion. No back pain.  Psych:  No change in mood or affect. No depression or anxiety. No memory loss.  Neuro: no localizing neurological complaints, no tingling, no weakness, no double vision, no gait abnormality, no slurred speech, no confusion  All systems reviewed and apart from HOPI all are negative _______________________________________________________________________________________________ Past Medical History:   Past Medical History:  Diagnosis Date  . Asthma   . CHF (congestive heart failure) (HCC)   . Diabetes mellitus without complication (HCC)   . OSA on CPAP   . Pneumonia 06/2016      Past Surgical History:  Procedure Laterality Date  . I & D EXTREMITY    . SKIN GRAFT      Social History:  Ambulatory   Independently      reports that he has been smoking cigarettes. He started smoking about 3 years ago. He has been smoking about 0.75 packs per day. He has never used smokeless tobacco. He reports current alcohol use. He reports that he does not use drugs.   Family History:   Family History  Problem Relation Age of Onset  . Congestive Heart Failure Mother   . Hypertension Mother   . Stroke Father        2017  . Asthma Father    ______________________________________________________________________________________________ Allergies: Allergies  Allergen Reactions  . Azithromycin Itching and Rash    Burning rash with intravenous dose     Prior to Admission medications   Medication Sig Start Date End Date Taking? Authorizing Provider  cetirizine (ZYRTEC) 10 MG tablet Take 10 mg by mouth daily.   Yes [provider]  ibuprofen (ADVIL) 200 MG tablet Take 200 mg by mouth every 6 (six) hours as needed for moderate pain.   Yes [provider]  carvedilol (COREG) 12.5 MG tablet Take 1 tablet (12.5 mg total) by mouth 2 (two) times daily with  a meal. Patient not taking: No sig reported 02/21/19   Clarisa Kindred A, FNP  fexofenadine (ALLEGRA) 180 MG tablet Take 1 tablet (180 mg total) by mouth daily. Taking as needed Patient not taking: No sig reported 11/11/18   Alwyn Ren, MD  furosemide (LASIX) 80 MG tablet Take 1 tablet (80 mg total) by mouth 2 (two) times daily. Patient not taking: No sig reported 02/21/19   Clarisa Kindred A, FNP  HYDROcodone-acetaminophen (NORCO/VICODIN) 5-325 MG tablet Take 2 tablets by mouth every 6 (six) hours as needed. Patient not taking: No sig reported 06/22/20   Ward, Baxter Hire N, DO  lisinopril (ZESTRIL) 5 MG tablet Take 1 tablet (5 mg total) by mouth daily. Patient not taking: No sig reported 02/21/19   Clarisa Kindred A, FNP  metFORMIN (GLUCOPHAGE) 500 MG tablet Take 1 tablet (500 mg total) by mouth 2 (two) times daily with a meal. Patient not taking: No sig reported 03/21/19   Clarisa Kindred A, FNP  ondansetron (ZOFRAN ODT) 4 MG disintegrating tablet  Take 1 tablet (4 mg total) by mouth every 8 (eight) hours as needed for nausea or vomiting. Patient not taking: No sig reported 06/22/20   Ward, Layla Maw, DO    ___________________________________________________________________________________________________ Physical Exam: Vitals with BMI 11/15/2020 11/15/2020 11/15/2020  Height - - -  Weight - - -  BMI - - -  Systolic 160 173 161  Diastolic 120 124 096  Pulse 102 100 -    1. General:  in No  Acute distress   Chronically ill  -appearing 2. Psychological: Alert and  Oriented 3. Head/ENT:    Dry Mucous Membranes                          Head Non traumatic, neck supple                            Poor Dentition 4. SKIN:   decreased Skin turgor,  Skin clean Dry and intact no rash 5. Heart: Regular rate and rhythm no Murmur, no Rub or gallop 6. Lungs:  no wheezes or crackles   7. Abdomen: Soft non-tender, Non distended  obese  bowel sounds present 8. Lower extremities: no clubbing, cyanosis,   Edema L>R 9. Neurologically Grossly intact, moving all 4 extremities equally   10. MSK: Normal range of motion    Chart has been reviewed  ______________________________________________________________________________________________  Assessment/Plan   -continue to treat possible underlying bacterial pneumonia, add steroids 60 mg of Solu-Medrol today readdress tomorrow  Admitted for acute respiratory failure likely combination of CHF exacerbation and pulmonary infiltrates suspicious for sarcoidosis  Present on Admission: . CAP (community acquired pneumonia) with chronic persistent infiltrate also suspicious for sarcoidosis Discussed with pulmonology. For right now continue antibiotics doxycycline and Rocephin Obtain sputum culture ACE level vitamin D level calcium appears to be within normal limits.  Appreciate pulmonology consult in the A.m.  start steroids at 60 mg Solu-Medrol now defer to pulmonology regarding continuation and dosage. Obtain sputum cultures  . Uncontrolled type 2 diabetes mellitus with hyperglycemia (HCC) -order sliding scale diabetes coordinator consult check TSH  . Tobacco use -  - Spoke about importance of quitting spent 5 minutes discussing options for treatment, prior attempts at quitting, and dangers of smoking  -At this point patient is  interested in quitting  - order nicotine patch   - nursing tobacco cessation protocol  . HTN (hypertension) -discussed with cardiology avoid beta-blocker given possible cardiac sarcoidosis with conduction delay.  Instead we will start on hydralazine low-dose and titrate as needed.  Also give Lasix  . Acute on chronic systolic CHF (congestive heart failure) (HCC) -patient noncompliant at baseline.  Has known EF of 25% from 2018.  Will repeat echogram.  Diuresis with IV Lasix 40 mg daily for now and further adjust as needed Will possibly benefit from cardiac MRI  . Acute respiratory failure with hypoxia (HCC) -  this patient  has acute respiratory failure with Hypoxia  as documented by the presence of following: O2 saturatio< 90% on RA   Likely due to:   Pneumonia, CHF exacerbation,   Provide O2 therapy and titrate as needed  Continuous pulse ox   check Pulse ox with ambulation prior to discharge  may need  TC consult for home O2 set up      . Troponin level elevated in the setting of CHF exacerbation continue to follow Continue serial EKG and echogram cardiology is  aware   OSA - order CPAP  Left leg edema - order dopplers  Other plan as per orders.  DVT prophylaxis:    Lovenox       Code Status:    Code Status: Prior FULL CODE  as per patient   I had personally discussed CODE STATUS with patient     Family Communication:   Family not at  Bedside    Disposition Plan:    To home once workup is complete and patient is stable   Following barriers for discharge:                            Electrolytes corrected                          able to transition to PO antibiotics                             Will need to be able to tolerate PO                            Will likely need home health, home O2, set up                           Will need consultants to evaluate patient prior to discharge                      Would benefit from PT/OT eval prior to DC  Ordered                                     Diabetes care coordinator                                        Consults called: pulmonology will see in AM Cardiology [facility] will see in consult today otherwise in the morning.  Admission status:  ED Disposition    ED Disposition Condition Comment   Admit  [facility] [[id number]]  Level of Care: Progressive [102]  Admit to Progressive based on following criteria: CARDIOVASCULAR & THORACIC of moderate stability with acute coronary syndrome symptoms/low risk myocardial infarction/hypertensive urgency/arrhythmias/heart failure potentially  compromising stability and stable post cardiovascular intervention patients.  [facility] if equivalent level of care is available:: No  Covid Evaluation: Confirmed COVID Negative  Diagnosis: CHF (congestive heart failure) [facility](HCC) [[id number]]) [[id number]]  Admitting Physician: Therisa DoyneUTOVA, Nessie Nong [[id number]]  Attending Physician: Therisa DoyneUTOVA, Clarrissa Shimkus [[id number]]  Estimated length of stay: past midnight tomorrow  Certification:: I certify this patient will need inpatient services for at least 2 midnights         inpatient     I Expect 2 midnight stay secondary to severity of patient's current illness need for inpatient interventions justified by the following:  hemodynamic instability despite optimal treatment (tachycardia  Hypertension  tachypnea  hypoxia,  )   Severe lab/radiological/exam abnormalities including:   Pulmonary infiltrates and elevated troponin and extensive comorbidities including:  DM2    CHF  Morbid Obesity  That are currently affecting medical management.   I expect  patient to be hospitalized for 2 midnights requiring inpatient medical care.  Patient is at high risk for adverse outcome (such as loss of life or disability) if not treated.  Indication for inpatient stay as follows:    Hemodynamic instability despite maximal medical therapy,    New or worsening hypoxia  Need for IV antibiotics  IV antihypertensives, IV steroids    Level of care   progressive tele indefinitely please discontinue once patient no longer qualifies COVID-19 Labs    Lab Results  Component Value Date   SARSCOV2NAA NEGATIVE 11/15/2020     Precautions: admitted as Covid Negative   PPE: Used by the provider:   N95   eye Goggles,  Gloves     Montzerrat Brunell 11/15/2020, 11:39 PM   [facility]     after 2 AM please page floor coverage PA If 7AM-7PM, please contact the day team taking care of the patient using [email or web address]   Patient was evaluated in  the context of the global COVID-19 pandemic, which necessitated consideration that the patient might be at risk for infection with the SARS-CoV-2 virus that causes COVID-19. Institutional protocols and algorithms that pertain to the evaluation of patients at risk for COVID-19 are in a state of rapid change based on information released by regulatory bodies including the CDC and federal and state organizations. These policies and algorithms were followed during the patient's care.

## 2020-11-15 NOTE — ED Notes (Signed)
Two RNs attempted to straight stick pt for admission labs without success. IV team consult placed.

## 2020-11-15 NOTE — ED Notes (Signed)
Shot extra EKG and gave to [facility] MD to review due to abnormal 12-lead findings. Picture in chart.

## 2020-11-16 ENCOUNTER — Inpatient Hospital Stay (HOSPITAL_COMMUNITY): Payer: Medicaid Other

## 2020-11-16 ENCOUNTER — Other Ambulatory Visit (HOSPITAL_COMMUNITY): Payer: Self-pay

## 2020-11-16 DIAGNOSIS — I5043 Acute on chronic combined systolic (congestive) and diastolic (congestive) heart failure: Secondary | ICD-10-CM

## 2020-11-16 DIAGNOSIS — D8685 Sarcoid myocarditis: Secondary | ICD-10-CM

## 2020-11-16 DIAGNOSIS — I5023 Acute on chronic systolic (congestive) heart failure: Secondary | ICD-10-CM

## 2020-11-16 DIAGNOSIS — I5021 Acute systolic (congestive) heart failure: Secondary | ICD-10-CM

## 2020-11-16 DIAGNOSIS — J9601 Acute respiratory failure with hypoxia: Secondary | ICD-10-CM

## 2020-11-16 DIAGNOSIS — R0902 Hypoxemia: Secondary | ICD-10-CM

## 2020-11-16 DIAGNOSIS — R609 Edema, unspecified: Secondary | ICD-10-CM

## 2020-11-16 LAB — PHOSPHORUS: Phosphorus: 3.7 mg/dL (ref 2.5–4.6)

## 2020-11-16 LAB — COMPREHENSIVE METABOLIC PANEL
ALT: 43 U/L (ref 0–44)
AST: 21 U/L (ref 15–41)
Albumin: 3.4 g/dL — ABNORMAL LOW (ref 3.5–5.0)
Alkaline Phosphatase: 126 U/L (ref 38–126)
Anion gap: 7 (ref 5–15)
BUN: 15 mg/dL (ref 6–20)
CO2: 27 mmol/L (ref 22–32)
Calcium: 8.8 mg/dL — ABNORMAL LOW (ref 8.9–10.3)
Chloride: 100 mmol/L (ref 98–111)
Creatinine, Ser: 0.72 mg/dL (ref 0.61–1.24)
GFR, Estimated: >60 mL/min (ref >60)
Glucose, Bld: 411 mg/dL — ABNORMAL HIGH (ref 70–99)
Potassium: 4.7 mmol/L (ref 3.5–5.1)
Sodium: 134 mmol/L — ABNORMAL LOW (ref 135–145)
Total Bilirubin: 1.1 mg/dL (ref 0.3–1.2)
Total Protein: 6.7 g/dL (ref 6.5–8.1)

## 2020-11-16 LAB — CBC WITH DIFFERENTIAL/PLATELET
Abs Immature Granulocytes: 0.03 10*3/uL (ref 0.00–0.07)
Basophils Absolute: 0 10*3/uL (ref 0.0–0.1)
Basophils Relative: 0 %
Eosinophils Absolute: 0 10*3/uL (ref 0.0–0.5)
Eosinophils Relative: 0 %
HCT: 51.7 % (ref 39.0–52.0)
Hemoglobin: 17.1 g/dL — ABNORMAL HIGH (ref 13.0–17.0)
Immature Granulocytes: 0 %
Lymphocytes Relative: 7 %
Lymphs Abs: 0.7 10*3/uL (ref 0.7–4.0)
MCH: 28.3 pg (ref 26.0–34.0)
MCHC: 33.1 g/dL (ref 30.0–36.0)
MCV: 85.5 fL (ref 80.0–100.0)
Monocytes Absolute: 0.1 10*3/uL (ref 0.1–1.0)
Monocytes Relative: 1 %
Neutro Abs: 8.7 10*3/uL — ABNORMAL HIGH (ref 1.7–7.7)
Neutrophils Relative %: 92 %
Platelets: 226 10*3/uL (ref 150–400)
RBC: 6.05 MIL/uL — ABNORMAL HIGH (ref 4.22–5.81)
RDW: 13.6 % (ref 11.5–15.5)
WBC: 9.6 10*3/uL (ref 4.0–10.5)
nRBC: 0 % (ref 0.0–0.2)

## 2020-11-16 LAB — ECHOCARDIOGRAM COMPLETE
Calc EF: 27.2 %
Height: 75 in
S' Lateral: 5.7 cm
Single Plane A2C EF: 22.9 %
Single Plane A4C EF: 34.6 %
Weight: 6366.88 oz

## 2020-11-16 LAB — BASIC METABOLIC PANEL
Anion gap: 10 (ref 5–15)
BUN: 14 mg/dL (ref 6–20)
CO2: 30 mmol/L (ref 22–32)
Calcium: 8.8 mg/dL — ABNORMAL LOW (ref 8.9–10.3)
Chloride: 97 mmol/L — ABNORMAL LOW (ref 98–111)
Creatinine, Ser: 0.66 mg/dL (ref 0.61–1.24)
GFR, Estimated: >60 mL/min (ref >60)
Glucose, Bld: 242 mg/dL — ABNORMAL HIGH (ref 70–99)
Potassium: 3.8 mmol/L (ref 3.5–5.1)
Sodium: 137 mmol/L (ref 135–145)

## 2020-11-16 LAB — PROCALCITONIN
Procalcitonin: <0.1 ng/mL
Procalcitonin: <0.1 ng/mL

## 2020-11-16 LAB — EXPECTORATED SPUTUM ASSESSMENT W GRAM STAIN, RFLX TO RESP C

## 2020-11-16 LAB — LACTIC ACID, PLASMA
Lactic Acid, Venous: 1.3 mmol/L (ref 0.5–1.9)
Lactic Acid, Venous: 1.4 mmol/L (ref 0.5–1.9)

## 2020-11-16 LAB — GLUCOSE, CAPILLARY
Glucose-Capillary: 407 mg/dL — ABNORMAL HIGH (ref 70–99)
Glucose-Capillary: 368 mg/dL — ABNORMAL HIGH (ref 70–99)
Glucose-Capillary: 353 mg/dL — ABNORMAL HIGH (ref 70–99)
Glucose-Capillary: 307 mg/dL — ABNORMAL HIGH (ref 70–99)
Glucose-Capillary: 245 mg/dL — ABNORMAL HIGH (ref 70–99)

## 2020-11-16 LAB — LACTATE DEHYDROGENASE
LDH: 152 U/L (ref 98–192)
LDH: 160 U/L (ref 98–192)

## 2020-11-16 LAB — SEDIMENTATION RATE
Sed Rate: 20 mm/hr — ABNORMAL HIGH (ref 0–16)
Sed Rate: 2 mm/hr (ref 0–16)

## 2020-11-16 LAB — BLOOD GAS, VENOUS
Acid-Base Excess: 5.1 mmol/L — ABNORMAL HIGH (ref 0.0–2.0)
Bicarbonate: 30.3 mmol/L — ABNORMAL HIGH (ref 20.0–28.0)
O2 Saturation: 87.9 %
Patient temperature: 98.6
pCO2, Ven: 47.3 mmHg (ref 44.0–60.0)
pH, Ven: 7.423 (ref 7.250–7.430)
pO2, Ven: 55.1 mmHg — ABNORMAL HIGH (ref 32.0–45.0)

## 2020-11-16 LAB — MAGNESIUM
Magnesium: 1.7 mg/dL (ref 1.7–2.4)
Magnesium: 1.7 mg/dL (ref 1.7–2.4)

## 2020-11-16 LAB — VITAMIN D 25 HYDROXY (VIT D DEFICIENCY, FRACTURES): Vit D, 25-Hydroxy: 19.23 ng/mL — ABNORMAL LOW (ref 30–100)

## 2020-11-16 LAB — CBG MONITORING, ED: Glucose-Capillary: 265 mg/dL — ABNORMAL HIGH (ref 70–99)

## 2020-11-16 LAB — TROPONIN I (HIGH SENSITIVITY)
Troponin I (High Sensitivity): 37 ng/L — ABNORMAL HIGH (ref <18)
Troponin I (High Sensitivity): 23 ng/L — ABNORMAL HIGH (ref <18)
Troponin I (High Sensitivity): 25 ng/L — ABNORMAL HIGH (ref <18)

## 2020-11-16 LAB — CK
Total CK: 38 U/L — ABNORMAL LOW (ref 49–397)
Total CK: 38 U/L — ABNORMAL LOW (ref 49–397)

## 2020-11-16 LAB — C-REACTIVE PROTEIN: CRP: 1.4 mg/dL — ABNORMAL HIGH (ref <1.0)

## 2020-11-16 LAB — HEMOGLOBIN A1C
Hgb A1c MFr Bld: 11.5 % — ABNORMAL HIGH (ref 4.8–5.6)
Mean Plasma Glucose: 283 mg/dL

## 2020-11-16 LAB — TSH: TSH: 1.686 u[IU]/mL (ref 0.350–4.500)

## 2020-11-16 LAB — HIV ANTIBODY (ROUTINE TESTING W REFLEX): HIV Screen 4th Generation wRfx: NONREACTIVE

## 2020-11-16 LAB — STREP PNEUMONIAE URINARY ANTIGEN: Strep Pneumo Urinary Antigen: NEGATIVE

## 2020-11-16 MED ORDER — HYDROCODONE-ACETAMINOPHEN 5-325 MG PO TABS: 1.0000 | ORAL_TABLET | ORAL | Status: DC | PRN

## 2020-11-16 MED ORDER — LOSARTAN POTASSIUM 25 MG PO TABS
25.0000 mg | ORAL_TABLET | Freq: Every day | ORAL | Status: DC
  Administered 2020-11-16: 25 mg via ORAL
  Filled 2020-11-16: qty 1

## 2020-11-16 MED ORDER — ACETAMINOPHEN 325 MG PO TABS
650.0000 mg | ORAL_TABLET | Freq: Four times a day (QID) | ORAL | Status: DC | PRN
  Administered 2020-11-16 – 2020-11-17 (×2): 650 mg via ORAL
  Filled 2020-11-16 (×2): qty 2

## 2020-11-16 MED ORDER — SODIUM CHLORIDE 0.9% FLUSH: 3.0000 mL | INTRAVENOUS | Status: DC | PRN

## 2020-11-16 MED ORDER — FUROSEMIDE 10 MG/ML IJ SOLN
40.0000 mg | Freq: Two times a day (BID) | INTRAMUSCULAR | Status: DC
Start: 2020-11-16 — End: 2020-11-20
  Administered 2020-11-16 – 2020-11-19 (×7): 40 mg via INTRAVENOUS
  Filled 2020-11-16 (×7): qty 4

## 2020-11-16 MED ORDER — SODIUM CHLORIDE 0.9 % IV SOLN: 250.0000 mL | INTRAVENOUS | Status: DC | PRN

## 2020-11-16 MED ORDER — NICOTINE 21 MG/24HR TD PT24
21.0000 mg | MEDICATED_PATCH | Freq: Every day | TRANSDERMAL | Status: DC
  Administered 2020-11-16 – 2020-11-23 (×8): 21 mg via TRANSDERMAL
  Filled 2020-11-16 (×8): qty 1

## 2020-11-16 MED ORDER — CARVEDILOL 3.125 MG PO TABS
3.1250 mg | ORAL_TABLET | Freq: Two times a day (BID) | ORAL | Status: DC
  Administered 2020-11-16 – 2020-11-20 (×8): 3.125 mg via ORAL
  Filled 2020-11-16 (×10): qty 1

## 2020-11-16 MED ORDER — FUROSEMIDE 10 MG/ML IJ SOLN
40.0000 mg | Freq: Every day | INTRAMUSCULAR | Status: DC
  Administered 2020-11-16: 40 mg via INTRAVENOUS
  Filled 2020-11-16: qty 4

## 2020-11-16 MED ORDER — LIVING WELL WITH DIABETES BOOK
Freq: Once | Status: AC
  Filled 2020-11-16: qty 1

## 2020-11-16 MED ORDER — ENOXAPARIN SODIUM 100 MG/ML IJ SOSY
90.0000 mg | PREFILLED_SYRINGE | INTRAMUSCULAR | Status: DC
  Administered 2020-11-16 – 2020-11-18 (×3): 90 mg via SUBCUTANEOUS
  Filled 2020-11-16 (×3): qty 1

## 2020-11-16 MED ORDER — LORATADINE 10 MG PO TABS
10.0000 mg | ORAL_TABLET | Freq: Once | ORAL | Status: AC
  Administered 2020-11-16: 10 mg via ORAL
  Filled 2020-11-16: qty 1

## 2020-11-16 MED ORDER — ACETAMINOPHEN 650 MG RE SUPP: 650.0000 mg | Freq: Four times a day (QID) | RECTAL | Status: DC | PRN

## 2020-11-16 MED ORDER — SODIUM CHLORIDE 0.9% FLUSH
3.0000 mL | Freq: Two times a day (BID) | INTRAVENOUS | Status: DC
  Administered 2020-11-16 – 2020-11-19 (×8): 3 mL via INTRAVENOUS

## 2020-11-16 MED ORDER — METFORMIN HCL 500 MG PO TABS: 500.0000 mg | ORAL_TABLET | Freq: Two times a day (BID) | ORAL | Status: DC

## 2020-11-16 NOTE — Progress Notes (Signed)
   Consult this morning reviewed - agree with plan for TID lasix. Echo pending, however, history of known cardiomyopathy with LVEF 25-30% in the past. Not clear if this is an ischemic or non-ischemic cardiomyopathy, probably the latter. May need an ischemic evaluation once more compensated. Also reasonable to consider a cMRI if LVEF remains depressed to look for other causes. Continue to optimize GDMT.  Chrystie Nose, MD, [facility], FACP  [place]  [facility]  Medical Director of the [facility] Lipid Disorders &  Cardiovascular Risk Reduction Clinic Diplomate of the American Board of Clinical Lipidology Attending Cardiologist  Direct Dial: [phone]  Fax: [phone]  Website:  [email or web address]

## 2020-11-16 NOTE — Consult Note (Signed)
Cardiology Consult    Patient ID: Marc Wright MRN: [id number], DOB/AGE: Aug 22, 1975   Admit date: 11/15/2020 Date of Consult: 11/16/2020 Requesting Provider: Lana Fish, MD  PCP:  Patient, No Pcp Per (Inactive)   [facility] Providers Cardiologist:  None       Patient Profile    Marc Wright is a 45 y.o. male with a history of HFrEF, 25-30%, HTN, morbid obesity, OSA on CPAP, T2DM who presented with shortness of breath and hypoxemic respiratory failure.  History of Present Illness    This is a 45 year old male with past medical history as above on whom we are consulted for acute on chronic heart failure.  He has been feeling worse for the last few days with worsening shortness of breath, orthopnea, and PND.  He has also noticed worsening lower extremity edema.  It worsened to the point that he was having difficulty with even minimal exertion so he decided to present to the ED.  Alongside this he has had a cough productive of non-bloody sputum but denies fevers, chills, nausea, vomiting, abdominal pain, or dysruia.  He was initially diagnosed with heart failure in January 2018 but has not followed with a cardiologist since that time.  He had been placed on carvedilol and lisinopril for his cardiomyopathy but did not follow up with cardiology and has not been taking these medications since that time (only taking occasional ibuprofen prior to presentation).  He is a smoker who is trying to quit, rarely drinks, denies sympathomimetic use, and works as a Investment banker, 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  He was hypoxemic on arrival to the ED requiring 2L of oxygen and desaturated with oxygen.  A CT of his chest demonstrated multifocal consolidation with mediastinal lymphadenopathy and some infiltrates concerning for sarcoid.  He was severely hypertensive to 230/140 at presentation but had recovered to 140/100 at the time of my evaluation and appeared fairly comfortable.  Past Medical History   Past Medical  History:  Diagnosis Date  . Asthma   . CHF (congestive heart failure) (HCC)   . Diabetes mellitus without complication (HCC)   . OSA on CPAP   . Pneumonia 06/2016    Past Surgical History:  Procedure Laterality Date  . I & D EXTREMITY    . SKIN GRAFT       Allergies  Allergen Reactions  . Azithromycin Itching and Rash    Burning rash with intravenous dose   Inpatient Medications    . doxycycline  100 mg Oral Q12H  . enoxaparin (LOVENOX) injection  90 mg Subcutaneous Q24H  . furosemide  40 mg Intravenous Daily  . hydrALAZINE  10 mg Oral Q8H  . insulin aspart  0-15 Units Subcutaneous Q4H  . nicotine  21 mg Transdermal Daily  . sodium chloride flush  3 mL Intravenous Q12H    Family History    Family History  Problem Relation Age of Onset  . Congestive Heart Failure Mother   . Hypertension Mother   . Stroke Father        2017  . Asthma Father    He indicated that his mother is deceased. He indicated that his father is alive.   Social History    Social History   Socioeconomic History  . Marital status: Divorced    Spouse name: Not on file  . Number of children: 1  . Years of education: Not on file  . Highest education level: Associate degree: occupational, Scientist, product/process development, or vocational program  Occupational History  . Not on file  Tobacco Use  . Smoking status: Current Every Day Smoker    Packs/day: 0.75    Types: Cigarettes    Start date: 11/23/2016  . Smokeless tobacco: Never Used  Vaping Use  . Vaping Use: Former  . Start date: 11/23/2016  Substance and Sexual Activity  . Alcohol use: Yes    Comment: occasional   . Drug use: No  . Sexual activity: Not Currently  Other Topics Concern  . Not on file  Social History Narrative  . Not on file   Social Determinants of Health   Financial Resource Strain: Not on file  Food Insecurity: Not on file  Transportation Needs: Not on file  Physical Activity: Not on file  Stress: Not on file  Social Connections:  Not on file  Intimate Partner Violence: Not on file     Review of Systems    General:  No chills, fever, night sweats or weight changes.  Cardiovascular:  No chest pain, dyspnea on exertion, edema, orthopnea, palpitations, paroxysmal nocturnal dyspnea. Dermatological: No rash, lesions/masses Respiratory: No cough, dyspnea Urologic: No hematuria, dysuria Abdominal:   No nausea, vomiting, diarrhea, bright red blood per rectum, melena, or hematemesis Neurologic:  No visual changes, wkns, changes in mental status. All other systems reviewed and are otherwise negative except as noted above.  Physical Exam    Blood pressure (!) 131/98, pulse 99, temperature 98.5 F (36.9 C), temperature source Oral, resp. rate (!) 21, height 6\' 3"  (1.905 m), weight (!) 180.5 kg, SpO2 98 %.     Intake/Output Summary (Last 24 hours) at 11/16/2020 0459 Last data filed at 11/16/2020 0300 Gross per 24 hour  Intake 360 ml  Output 3200 ml  Net -2840 ml   Wt Readings from Last 3 Encounters:  11/16/20 (!) 180.5 kg  06/21/20 (!) 180.5 kg  05/13/19 (!) 182.1 kg    CONSTITUTIONAL: alert and conversant, well-appearing, nourished, no distress HEENT: normal NECK: no JVD, no masses CARDIAC: Regular rhythm. Normal S1/S2, no S3/S4. No murmur. No friction rub. JVP 13 cm H20  VASCULAR: Radial pulses intact bilaterally. No carotid bruits. PULMONARY/CHEST WALL: no deformities, normal breath sounds bilaterally, normal work of breathing ABDOMINAL: soft, non-tender, non-distended EXTREMITIES: 2+ LE edema, no muscle atrophy, warm and well-perfused SKIN: Dry and intact without apparent rashes or wounds. No peripheral cyanosis. NEUROLOGIC: alert, no abnormal movements, cranial nerves grossly intact. PSYCH: normal affect, normal speech and language   Labs   GFR >60, Troponin 37, 30.  Lactic acid 1.3, Hemoglobin 1.7, BNP 598   Radiology Studies    CT Angio Chest PE W and/or Wo Contrast  Result Date:  11/15/2020 CLINICAL DATA:  Abnormal chest x-ray, cough, fever EXAM: CT ANGIOGRAPHY CHEST WITH CONTRAST TECHNIQUE: Multidetector CT imaging of the chest was performed using the standard protocol during bolus administration of intravenous contrast. Multiplanar CT image reconstructions and MIPs were obtained to evaluate the vascular anatomy. CONTRAST:  11/17/2020 OMNIPAQUE IOHEXOL 350 MG/ML SOLN COMPARISON:  11/06/2018 FINDINGS: Cardiovascular: There is adequate opacification of the pulmonary arterial tree. No intraluminal filling defect identified to suggest acute pulmonary embolism. The central pulmonary arteries are of normal caliber. Mild coronary artery calcification. There is mild global cardiomegaly with left ventricular dilation noted. No pericardial effusion. The thoracic aorta is unremarkable. Mediastinum/Nodes: There is extensive mediastinal and bilateral hilar adenopathy present, possibly reactive or inflammatory in nature. This appears similar to that noted on prior examination. Thyroid unremarkable. Esophagus unremarkable. Lungs/Pleura: There  are multiple regions of pulmonary consolidation involving the superior segments of the lower lobes bilaterally, the central right middle lobe, and peripherally within the left upper lobe. This appears similar, though progressive since prior examination both in appearance and location. Small right pleural effusion is present. No pneumothorax. Central airways are widely patent. Upper Abdomen: No acute abnormality. Specifically, the spleen does not appear enlarged. Musculoskeletal: No lytic or blastic bone lesion is identified. No acute bone abnormality. Review of the MIP images confirms the above findings. IMPRESSION: No pulmonary embolism. Multifocal pulmonary consolidation similar in appearance though progressive since remote prior examination of 11/06/2018, with extensive mediastinal and bilateral hilar adenopathy. Given its chronicity, differential considerations are  led by granulomatous and immunologic conditions such as sarcoidosis or granulomatosis with polyangiitis. Progressive cardiomegaly now with left ventricular dilation. Given the above findings, cardiac sarcoidosis could be considered. Electronically Signed   By: Helyn Numbers MD   On: 11/15/2020 21:28   DG Chest Port 1 View  Result Date: 11/15/2020 CLINICAL DATA:  Shortness of breath with cough and fever. EXAM: PORTABLE CHEST 1 VIEW COMPARISON:  11/06/2018 FINDINGS: 1657 hours. Low lung volumes. The cardio pericardial silhouette is enlarged. There is pulmonary vascular congestion without overt pulmonary edema. Right hilar and infrahilar opacity noted, progressive in the interval. Telemetry leads overlie the chest. IMPRESSION: 1. Low lung volumes with pulmonary vascular congestion. Component of interstitial edema not excluded. 2. Progressive opacity in the right hilar and infrahilar region. All imaging features may be related to infectious/inflammatory etiology, neoplasm could have this appearance. CT chest with contrast recommended to further evaluate. Electronically Signed   By: Kennith Center M.D.   On: 11/15/2020 17:21    ECG & Cardiac Imaging    ECG ST v. AT w/ non-specific intraventricular conduction delay.  Assessment & Plan    Marc Wright is a 45 y.o. male with a history of HFrEF, 25-30%, HTN, morbid obesity, OSA on CPAP, T2DM who presented with shortness of breath and hypoxemic respiratory failure.  Overall presentation consistent with decompensated heart failure though he has diffuse multifocal infiltrates on CT chest which may account for the productive sputum and hypoxemia complicating his presentation.  He does have progressive conduction delay on ECG which can be seen in the setting of sarcoid, though no significant ectopic burden on review of telemetry.  cMRI would be reasonable, though not sure he will fit in MRI scanner given his size.  For now recommend instituting medical therapy and  updating echocardiogram.  Given multiple risk factors coronary disease should be excluded though current presentation without chest pain and flat, low level troponin elevation is not consistent with ACS.  Problem list Acute on chronic systolic and diastolic heart failure Acute hypoxemic respiratory failure Multifocal pulmonary infiltrates with hilar lymphadenopathy Hypertensive urgency Morbid obesity OSA on CPAP T2DM  Recommendations Acute on chronic systolic and diastolic heart failure - Continue furosemide 40 mg IV TID for goal 3-4L negative, maintain K>4, Mg>2 - Start carvedilol 3.125 mg BID and titrate as tolerated - Start losartan 25 mg daily; can run into entresto if feasible from cost perspective (currently uninsured) - Initiate MRA v. SGLT2i once tolerating goal doses of above - TTE in AM - Can consider cMRI once improving and able to lie flat if it's feasible.  Luanna Salk, MD Cardiology Fellow [facility]  Signed, Regino Schultze, MD 11/16/2020, 4:59 AM  For questions or updates, please contact   Please consult [email or web address] for contact info under Cardiology/STEMI.

## 2020-11-16 NOTE — Progress Notes (Signed)
BLE venous duplex has been completed.    Results can be found under chart review under CV PROC. 11/16/2020 11:56 AM Kyshaun Barnette RVT, RDMS

## 2020-11-16 NOTE — Consult Note (Signed)
NAME:  Marc Wright, MRN:  [id number], DOB:  [birth year], LOS: 1 ADMISSION DATE:  11/15/2020, CONSULTATION DATE:  11/16/20 REFERRING MD:  TRH CHIEF COMPLAINT: Shortness of breath  History of Present Illness:  45 year old whom we are seeing in consultation for evaluation of abnormal chest imaging hypoxemia.  ED and H&P notes reviewed.  Patient is 1 week history of worsening dyspnea on exertion.  Associated with increased nasal congestion.  Developed shortness of breath at rest.  Shortness of breath worse when lying down, improves when sitting up.  Noted increasing lower extremity swelling more so on the left leg than the right over the same timeframe.  Denies respiratory issues with pollen earlier in the spring.  Denies any wheezing.  Worsened over time which prompted presentation to ED.  There BNP elevated.  Chest x-ray congested.  CT scan with bilateral) right greater than left pleural effusions with mild interlobular septal thickening.  Had several episodes of infiltrate most notably in bilateral lower lobes.  Reviewed CT scan 2020 monitor patient with similar appearing infiltrates although more prominent this admission.  No fever or chills.  Does have mild cough, sounds almost blood-tinged.  Reviewed labs extensively no leukocytosis, no fever here, procalcitonin negative x2.  Received Lasix.  Marc Wright is feeling better.  Weaning oxygen.  Notes Marc Wright desats when Marc Wright sleeps but has known sleep apnea.  Is not surprising.  Pertinent  Medical History  Cardiomyopathy, asthma, seasonal allergies  Significant Hospital Events: Including procedures, antibiotic start and stop dates in addition to other pertinent events   . 5/25-10/2024 admitted to hospital, began diuresed, got dose of Solu-Medrol 60 mg IV x1 . 5/27 pulmonary consulted  Interim History / Subjective:  As above  Objective   Blood pressure (!) 141/94, pulse 97, temperature 98.9 F (37.2 C), resp. rate 20, height 6' 3"  (1.905 m), weight (!) 180.5 kg,  SpO2 (!) 83 %.        Intake/Output Summary (Last 24 hours) at 11/16/2020 1135 Last data filed at 11/16/2020 0908 Gross per 24 hour  Intake 963 ml  Output 3200 ml  Net -2237 ml   Filed Weights   11/16/20 0015  Weight: (!) 180.5 kg    Examination: General: sitting in bed, no acute distress Eyes: EOMI, Neck: Supple, habitus precludes accurate JVP assessment Cardiovascular: Regular rhythm, no murmur, impressive doughy edema bilaterally Pulmonary: No wheeze, distant, difficult to auscultate Abdomen: Nondistended, bowel sounds present MSK: No synovitis, joint effusion Neuro: No wounds, sensation intact Psych normal mood, full affect   Labs/imaging that I havepersonally reviewed    CXR, CT chest, CBC, chem, CRP, ESR, procalcitonin  Resolved Hospital Problem list   n/a  Assessment & Plan:  Persistent bilateral LL infiltrates: Subacute to acute onset of SOB in setting of mild volume overload. Similar albeit slightly more prominent infiltrates compared to 2020. Central location could be consistent with pulmonary edema. Rounded atelectasis considered. Possible organizing pneumonia. Procalcitionin negative, no fever, no luekocytosis - infection felt unlikely. Sarcoid possible but lower lobe and appearence of infiltrates is atypical. LAD likely reactive.  --Continue aggressive diuresis as kidney function and BP allows --Will arrange OP follow up with imaging (hi res supine/prone) for ongoing evaluation; bronch with biopsy if infiltrates not improving  DOE, Hypoxemia: Likely multifactorial. Desats when asleep - known OSA not surprising. Bicarb elevated indicative of likely OHS. Volume overload. BNP elevated. Endorses orthopnea. Improving with diuresis.  --If not awake/daytime hypoxemia not improving with diuresis, will consider expedited inpatient bronch biopsy (  earliest available 5/31) please contact us for re-evaluation as needed  OSA: Diagnosed in past, did not tolerate CPAP. --Night  time O2, can repeat PSG as outpatient   Best practice (right click and "Reselect all SmartList Selections" daily)  Per primary  Labs   CBC: Recent Labs  Lab 11/15/20 1556 11/16/20 0421  WBC 7.9 9.6  NEUTROABS 5.3 8.7*  HGB 16.5 17.1*  HCT 50.9 51.7  MCV 86.0 85.5  PLT 229 263    Basic Metabolic Panel: Recent Labs  Lab 11/15/20 1556 11/16/20 0009 11/16/20 0421  NA 135 137 134*  K 5.4* 3.8 4.7  CL 100 97* 100  CO2 29 30 27   GLUCOSE 390* 242* 411*  BUN 16 14 15   CREATININE 0.71 0.66 0.72  CALCIUM 8.9 8.8* 8.8*  MG  --  1.7 1.7  PHOS  --   --  3.7   GFR: Estimated Creatinine Clearance: 198.4 mL/min (by C-G formula based on SCr of 0.72 mg/dL). Recent Labs  Lab 11/15/20 1556 11/15/20 1632 11/16/20 0008 11/16/20 0009 11/16/20 0421  PROCALCITON  --   --   --  <0.10 <0.10  WBC 7.9  --   --   --  9.6  LATICACIDVEN  --  1.4 1.3  --  1.4    Liver Function Tests: Recent Labs  Lab 11/15/20 1556 11/16/20 0421  AST 51* 21  ALT 44 43  ALKPHOS 128* 126  BILITOT 0.9 1.1  PROT 7.0 6.7  ALBUMIN 3.5 3.4*   No results for input(s): LIPASE, AMYLASE in the last 168 hours. No results for input(s): AMMONIA in the last 168 hours.  ABG    Component Value Date/Time   PHART 7.390 11/07/2018 0251   PCO2ART 40.5 11/07/2018 0251   PO2ART 80.1 (L) 11/07/2018 0251   HCO3 30.3 (H) 11/16/2020 0014   TCO2 31 03/06/2016 1324   ACIDBASEDEF 0.3 11/07/2018 0251   O2SAT 87.9 11/16/2020 0014     Coagulation Profile: No results for input(s): INR, PROTIME in the last 168 hours.  Cardiac Enzymes: Recent Labs  Lab 11/16/20 0009 11/16/20 0421  CKTOTAL 38* 38*    HbA1C: Hgb A1c MFr Bld  Date/Time Value Ref Range Status  11/07/2018 02:39 AM 13.3 (H) 4.8 - 5.6 % Final    Comment:    (NOTE) Pre diabetes:          5.7%-6.4% Diabetes:              >6.4% Glycemic control for   <7.0% adults with diabetes   06/25/2016 06:03 PM 7.3 (H) 4.8 - 5.6 % Final    Comment:     (NOTE)         Pre-diabetes: 5.7 - 6.4         Diabetes: >6.4         Glycemic control for adults with diabetes: <7.0     CBG: Recent Labs  Lab 11/16/20 0015 11/16/20 0428 11/16/20 0805 11/16/20 1117  GLUCAP 265* 407* 368* 353*    Review of Systems:   No chest pain with exertion. No wheezing. No neurologic changes. Comprehensive ROS otherwise negative.  Past Medical History:  Marc Wright,  has a past medical history of Asthma, CHF (congestive heart failure) ([place]), Diabetes mellitus without complication ([place]), OSA on CPAP, and Pneumonia (06/2016).   Surgical History:   Past Surgical History:  Procedure Laterality Date  . I & D EXTREMITY    . SKIN GRAFT       Social History:  reports that Marc Wright has been smoking cigarettes. Marc Wright started smoking about 3 years ago. Marc Wright has been smoking about 0.75 packs per day. Marc Wright has never used smokeless tobacco. Marc Wright reports current alcohol use. Marc Wright reports that Marc Wright does not use drugs.   Family History:  His family history includes Asthma in his father; Congestive Heart Failure in his mother; Hypertension in his mother; Stroke in his father.   Allergies Allergies  Allergen Reactions  . Azithromycin Itching and Rash    Burning rash with intravenous dose     Home Medications  Prior to Admission medications   Medication Sig Start Date End Date Taking? Authorizing Provider  cetirizine (ZYRTEC) 10 MG tablet Take 10 mg by mouth daily.   Yes [provider]  ibuprofen (ADVIL) 200 MG tablet Take 200 mg by mouth every 6 (six) hours as needed for moderate pain.   Yes [provider]  carvedilol (COREG) 12.5 MG tablet Take 1 tablet (12.5 mg total) by mouth 2 (two) times daily with a meal. Patient not taking: No sig reported 02/21/19   Darylene Price A, FNP  fexofenadine (ALLEGRA) 180 MG tablet Take 1 tablet (180 mg total) by mouth daily. Taking as needed Patient not taking: No sig reported 11/11/18   Georgette Shell, MD  furosemide (LASIX) 80  MG tablet Take 1 tablet (80 mg total) by mouth 2 (two) times daily. Patient not taking: No sig reported 02/21/19   Darylene Price A, FNP  HYDROcodone-acetaminophen (NORCO/VICODIN) 5-325 MG tablet Take 2 tablets by mouth every 6 (six) hours as needed. Patient not taking: No sig reported 06/22/20   Ward, Cyril Mourning N, DO  lisinopril (ZESTRIL) 5 MG tablet Take 1 tablet (5 mg total) by mouth daily. Patient not taking: No sig reported 02/21/19   Darylene Price A, FNP  metFORMIN (GLUCOPHAGE) 500 MG tablet Take 1 tablet (500 mg total) by mouth 2 (two) times daily with a meal. Patient not taking: No sig reported 03/21/19   Darylene Price A, FNP  ondansetron (ZOFRAN ODT) 4 MG disintegrating tablet Take 1 tablet (4 mg total) by mouth every 8 (eight) hours as needed for nausea or vomiting. Patient not taking: No sig reported 06/22/20   Ward, Delice Bison, DO     Critical care time: n/a

## 2020-11-16 NOTE — Plan of Care (Signed)
  Problem: Education: Goal: Knowledge of General Education information will improve Description Including pain rating scale, medication(s)/side effects and non-pharmacologic comfort measures Outcome: Progressing   Problem: Health Behavior/Discharge Planning: Goal: Ability to manage health-related needs will improve Outcome: Progressing   

## 2020-11-16 NOTE — Progress Notes (Signed)
  Echocardiogram 2D Echocardiogram has been performed.  Marc Wright 11/16/2020, 10:51 AM

## 2020-11-16 NOTE — Progress Notes (Signed)
PROGRESS NOTE   Marc Wright  XLK:[id number] DOB: 1975/12/25 DOA: 11/15/2020 PCP: Patient, No Pcp Per (Inactive)  Brief Narrative:  62 white male BMI 49 HFrEF 25% OSA noncompliant on CPAP DM TY 2 Current tobacco abuse half pack per day Occasional THC remote cocaine nothing recently Patient reportedly noncompliant on meds/CPAP  Present [facility] ED sore throat sputum difficulty breathing-at home self treated with several dose of Lasix Baseline does have orthopnea but has worsened over past several weeks  Rx in ED Lasix given BNP was 500 lactic acid normal CXR = right hilar opacity CT showing multifocal consolidation  Cardiology and pulmonology consulted  Hospital-Problem based course  Acute decompensated HFrEF EF 25% Defer to cardiology-follow echo 5/27 Diuresis Lasix 40 daily at this time Low-dose Coreg 3.125 given acute decompensation Losartan 25 at this time Net I/O: = -2.8 L Daily standing weights CAP Pneumonia Continue ceftriaxone doxycycline Reimage in the next 2 to 3 days to denote whether consolidations were pneumonia versus just fluid Strep pneumo is negative, Legionella in process Sarcoid granulomatous disease Follow ACE level ANA ESR and CRP are elevated but could be from infection Appreciate pulmonology input-they will follow in outpatient setting and consider work-up for granulomatous disease Hyperglycemia without diagnosis of diabetes mellitus Follow A1c Sugars likely elevated secondary to Solu-Medrol-monitor trends and adjust Will need education Will discuss metformin 500 twice daily in a.m. and initiate Smoker Stop steroids-not wheezing-not severely immunocompromised     DVT prophylaxis: Lovenox Code Status: Full Family Communication: Brother Disposition:  Status is: Inpatient  Remains inpatient appropriate because:Persistent severe electrolyte disturbances, Unsafe d/c plan and IV treatments appropriate due to intensity of illness or inability to take  PO   Dispo: The patient is from: Home              Anticipated d/c is to: Home              Patient currently is not medically stable to d/c.   Difficult to place patient No       Consultants:   Cardiology  Pulmonology  Procedures: Multiple  Antimicrobials: As above   Subjective: Awake coherent no distress quite tired has not slept since admission Eating drinking Brother at bedside Some sputum No fever no chills at this time States on a scale of 0-10 10 being his best he is about a 6 in terms of overall condition  Objective: Vitals:   11/16/20 0817 11/16/20 0823 11/16/20 1103 11/16/20 1209  BP: (!) 145/105 (!) 141/94  (!) 141/101  Pulse: 97   95  Resp:  20  16  Temp:  98.9 F (37.2 C)  97.8 F (36.6 C)  TempSrc:    Oral  SpO2:  95% (!) 83% 97%  Weight:      Height:        Intake/Output Summary (Last 24 hours) at 11/16/2020 1349 Last data filed at 11/16/2020 1240 Gross per 24 hour  Intake 1203 ml  Output 3200 ml  Net -1997 ml   Filed Weights   11/16/20 0015  Weight: (!) 180.5 kg    Examination:  Awake thick neck Mallampati 4 no icterus no pallor Multiple tattoos Decreased air entry posterolaterally some rales S1-S2 no murmur slight tachycardia Abdomen obese nontender Grade 2-3 lower extremity edema Psych euthymic  Data Reviewed: personally reviewed   CBC    Component Value Date/Time   WBC 9.6 11/16/2020 0421   RBC 6.05 (H) 11/16/2020 0421   HGB 17.1 (H) 11/16/2020 0421  HGB 15.6 11/24/2018 1204   HCT 51.7 11/16/2020 0421   HCT 48.9 11/24/2018 1204   PLT 226 11/16/2020 0421   PLT 562 (H) 11/24/2018 1204   MCV 85.5 11/16/2020 0421   MCV 88 11/24/2018 1204   MCH 28.3 11/16/2020 0421   MCHC 33.1 11/16/2020 0421   RDW 13.6 11/16/2020 0421   RDW 13.9 11/24/2018 1204   LYMPHSABS 0.7 11/16/2020 0421   LYMPHSABS 2.0 11/24/2018 1204   MONOABS 0.1 11/16/2020 0421   EOSABS 0.0 11/16/2020 0421   EOSABS 0.4 11/24/2018 1204   BASOSABS 0.0  11/16/2020 0421   BASOSABS 0.1 11/24/2018 1204   CMP Latest Ref Rng & Units 11/16/2020 11/16/2020 11/15/2020  Glucose 70 - 99 mg/dL 411(H) 242(H) 390(H)  BUN 6 - 20 mg/dL _0 Creatinine 0.61 - 1.24 mg/dL 0.72 0.66 0.71  Sodium 135 - 145 mmol/L 134(L) 137 135  Potassium 3.5 - 5.1 mmol/L 4.7 3.8 5.4(H)  Chloride 98 - 111 mmol/L 100 97(L) 100  CO2 22 - 32 mmol/L _1 Calcium 8.9 - 10.3 mg/dL 8.8(L) 8.8(L) 8.9  Total Protein 6.5 - 8.1 g/dL 6.7 - 7.0  Total Bilirubin 0.3 - 1.2 mg/dL 1.1 - 0.9  Alkaline Phos 38 - 126 U/L 126 - 128(H)  AST 15 - 41 U/L 21 - 51(H)  ALT 0 - 44 U/L 43 - 44     Radiology Studies: CT Angio Chest PE W and/or Wo Contrast  Result Date: 11/15/2020 CLINICAL DATA:  Abnormal chest x-ray, cough, fever EXAM: CT ANGIOGRAPHY CHEST WITH CONTRAST TECHNIQUE: Multidetector CT imaging of the chest was performed using the standard protocol during bolus administration of intravenous contrast. Multiplanar CT image reconstructions and MIPs were obtained to evaluate the vascular anatomy. CONTRAST:  163m OMNIPAQUE IOHEXOL 350 MG/ML SOLN COMPARISON:  11/06/2018 FINDINGS: Cardiovascular: There is adequate opacification of the pulmonary arterial tree. No intraluminal filling defect identified to suggest acute pulmonary embolism. The central pulmonary arteries are of normal caliber. Mild coronary artery calcification. There is mild global cardiomegaly with left ventricular dilation noted. No pericardial effusion. The thoracic aorta is unremarkable. Mediastinum/Nodes: There is extensive mediastinal and bilateral hilar adenopathy present, possibly reactive or inflammatory in nature. This appears similar to that noted on prior examination. Thyroid unremarkable. Esophagus unremarkable. Lungs/Pleura: There are multiple regions of pulmonary consolidation involving the superior segments of the lower lobes bilaterally, the central right middle lobe, and peripherally within the left upper lobe.  This appears similar, though progressive since prior examination both in appearance and location. Small right pleural effusion is present. No pneumothorax. Central airways are widely patent. Upper Abdomen: No acute abnormality. Specifically, the spleen does not appear enlarged. Musculoskeletal: No lytic or blastic bone lesion is identified. No acute bone abnormality. Review of the MIP images confirms the above findings. IMPRESSION: No pulmonary embolism. Multifocal pulmonary consolidation similar in appearance though progressive since remote prior examination of 11/06/2018, with extensive mediastinal and bilateral hilar adenopathy. Given its chronicity, differential considerations are led by granulomatous and immunologic conditions such as sarcoidosis or granulomatosis with polyangiitis. Progressive cardiomegaly now with left ventricular dilation. Given the above findings, cardiac sarcoidosis could be considered. Electronically Signed   By: AFidela SalisburyMD   On: 11/15/2020 21:28   DG Chest Port 1 View  Result Date: 11/15/2020 CLINICAL DATA:  Shortness of breath with cough and fever. EXAM: PORTABLE CHEST 1 VIEW COMPARISON:  11/06/2018 FINDINGS: 1657 hours. Low lung volumes. The cardio pericardial silhouette is enlarged. There  is pulmonary vascular congestion without overt pulmonary edema. Right hilar and infrahilar opacity noted, progressive in the interval. Telemetry leads overlie the chest. IMPRESSION: 1. Low lung volumes with pulmonary vascular congestion. Component of interstitial edema not excluded. 2. Progressive opacity in the right hilar and infrahilar region. All imaging features may be related to infectious/inflammatory etiology, neoplasm could have this appearance. CT chest with contrast recommended to further evaluate. Electronically Signed   By: Misty Stanley M.D.   On: 11/15/2020 17:21   VAS [place] LOWER EXTREMITY VENOUS (DVT)  Result Date: 11/16/2020  Lower Venous DVT Study Patient Name:  Marc Wright  Date of Exam:   11/16/2020 Medical Rec #: [id number]      Accession #:    [id number] Date of Birth: [birth year]      Patient Gender: M Patient Age:   33Y Exam Location:  [facility] Procedure:      VAS [place] LOWER EXTREMITY VENOUS (DVT) Referring Phys: Luck --------------------------------------------------------------------------------  Indications: Edema.  Risk Factors: Chronic CHF - noncompliant. Comparison Study: Previous 12/19/18 Negative Performing Technologist: Rogelia Rohrer  Examination Guidelines: A complete evaluation includes B-mode imaging, spectral Doppler, color Doppler, and power Doppler as needed of all accessible portions of each vessel. Bilateral testing is considered an integral part of a complete examination. Limited examinations for reoccurring indications may be performed as noted. The reflux portion of the exam is performed with the patient in reverse Trendelenburg.  +---------+---------------+---------+-----------+----------+--------------+ RIGHT    CompressibilityPhasicitySpontaneityPropertiesThrombus Aging +---------+---------------+---------+-----------+----------+--------------+ CFV      Full           Yes      Yes                                 +---------+---------------+---------+-----------+----------+--------------+ SFJ      Full                                                        +---------+---------------+---------+-----------+----------+--------------+ FV Prox  Full           Yes      Yes                                 +---------+---------------+---------+-----------+----------+--------------+ FV Mid   Full           Yes      Yes                                 +---------+---------------+---------+-----------+----------+--------------+ FV DistalFull           Yes      Yes                                 +---------+---------------+---------+-----------+----------+--------------+ PFV      Full                                                         +---------+---------------+---------+-----------+----------+--------------+  POP      Full           Yes      Yes                                 +---------+---------------+---------+-----------+----------+--------------+ PTV      Full                                                        +---------+---------------+---------+-----------+----------+--------------+ PERO     Full                                                        +---------+---------------+---------+-----------+----------+--------------+   +---------+---------------+---------+-----------+----------+--------------+ LEFT     CompressibilityPhasicitySpontaneityPropertiesThrombus Aging +---------+---------------+---------+-----------+----------+--------------+ CFV      Full           Yes      Yes                                 +---------+---------------+---------+-----------+----------+--------------+ SFJ      Full                                                        +---------+---------------+---------+-----------+----------+--------------+ FV Prox  Full           Yes      Yes                                 +---------+---------------+---------+-----------+----------+--------------+ FV Mid   Full           Yes      Yes                                 +---------+---------------+---------+-----------+----------+--------------+ FV DistalFull           Yes      Yes                                 +---------+---------------+---------+-----------+----------+--------------+ PFV      Full                                                        +---------+---------------+---------+-----------+----------+--------------+ POP      Full           Yes      Yes                                 +---------+---------------+---------+-----------+----------+--------------+ PTV  Full                                                         +---------+---------------+---------+-----------+----------+--------------+ PERO     Full                                                        +---------+---------------+---------+-----------+----------+--------------+     Summary: BILATERAL: - No evidence of deep vein thrombosis seen in the lower extremities, bilaterally. - No evidence of superficial venous thrombosis in the lower extremities, bilaterally. -No evidence of popliteal cyst, bilaterally.   *See table(s) above for measurements and observations. Electronically signed by Monica Martinez MD on 11/16/2020 at 12:40:12 PM.    Final      Scheduled Meds: . carvedilol  3.125 mg Oral BID WC  . doxycycline  100 mg Oral Q12H  . enoxaparin (LOVENOX) injection  90 mg Subcutaneous Q24H  . furosemide  40 mg Intravenous Daily  . insulin aspart  0-15 Units Subcutaneous Q4H  . losartan  25 mg Oral Daily  . nicotine  21 mg Transdermal Daily  . sodium chloride flush  3 mL Intravenous Q12H   Continuous Infusions: . sodium chloride    . cefTRIAXone (ROCEPHIN)  IV       LOS: 1 day   Time spent: 37 minutes  Nita Sells, MD [facility] To contact the attending provider between 7A-7P or the covering provider during after hours 7P-7A, please log into the web site [email or web address] and access using universal [place] password for that web site. If you do not have the password, please call the hospital operator.  11/16/2020, 1:49 PM

## 2020-11-16 NOTE — Evaluation (Signed)
Occupational Therapy Evaluation Patient Details Name: Marc Wright MRN: [id number] DOB: 07/14/1975 Today's Date: 11/16/2020    History of Present Illness Patient is a 45 y/o male who presents on 11/15/20 with SOB, cough, fever and, body aches. Chest CT- multifocal consolidation with mediastinal lymphadenopathy and some infiltrates concerning for sarcoid. Admitted with acute on chronic CHF, respiratory failure and HTN. PMH includes HFrEF 25-30%, HTN, morbid obesity, OSA on CPAP, DM2.   Clinical Impression   Patient admitted for the above diagnosis.  PTA he was independent with all care and mobility, works full time.  Barriers are listed below.  Currently he is near his baseline function, needing only increased time and effort for ADL completion.  OT discussed CHF management with daily weights and patient articulates the need to find insurance, see a PCP, and take the appropriate meds consistently.  OT encouraged progressive walking with staff in the halls, no further OT needs identified.      Follow Up Recommendations  No OT follow up    Equipment Recommendations  None recommended by OT    Recommendations for Marc Services       Precautions / Restrictions Precautions Precaution Comments: watch 02 Restrictions Weight Bearing Restrictions: No      Mobility Bed Mobility Overal bed mobility: Independent               Patient Response: Cooperative  Transfers Overall transfer level: Modified independent Equipment used: None             General transfer comment: walked to bathroom and back with Mod I    Balance Overall balance assessment: Mild deficits observed, not formally tested                                         ADL either performed or assessed with clinical judgement   ADL Overall ADL's : At baseline                                       General ADL Comments: Increased time and effort noted with O2 sats dropping 89% on  RA.  Rebounds quickly with rest break - to 96%     Vision Patient Visual Report: No change from baseline       Perception     Praxis      Pertinent Vitals/Pain Pain Assessment: No/denies pain Pain Intervention(s): Monitored during session     Hand Dominance Right   Extremity/Trunk Assessment Upper Extremity Assessment Upper Extremity Assessment: Overall WFL for tasks assessed   Lower Extremity Assessment Lower Extremity Assessment: Overall WFL for tasks assessed   Cervical / Trunk Assessment Cervical / Trunk Assessment: Normal   Communication Communication Communication: No difficulties   Cognition Arousal/Alertness: Awake/alert Behavior During Therapy: WFL for tasks assessed/performed Overall Cognitive Status: Within Functional Limits for tasks assessed                                                      Home Living Family/patient expects to be discharged to:: Private residence Living Arrangements: Alone   Type of Home: House Home Access: Stairs to enter [employer] of Steps: 6  Home Layout: One level     Bathroom Shower/Tub: Chief Strategy Officer: Standard     Home Equipment: None          Prior Functioning/Environment Level of Independence: Independent        Comments: Drives, works as a Investment banker, 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 Does not wear 02. Does own ADLs/IADLs, no AD for mobility.        OT Problem List: Impaired balance (sitting and/or standing);Decreased activity tolerance      OT Treatment/Interventions:      OT Goals(Current goals can be found in the care plan section) Acute Rehab OT Goals Patient Stated Goal: head back home, I need to find insurance so I can get back on my meds OT Goal Formulation: With patient Time For Goal Achievement: 11/16/20 Potential to Achieve Goals: Good  OT Frequency:     Barriers to D/C:  none noted          Co-evaluation              AM-PAC OT "6 Clicks"  Daily Activity     Outcome Measure Help from another person eating meals?: None Help from another person taking care of personal grooming?: None Help from another person toileting, which includes using toliet, bedpan, or urinal?: None Help from another person bathing (including washing, rinsing, drying)?: None Help from another person to put on and taking off regular upper body clothing?: None Help from another person to put on and taking off regular lower body clothing?: None 6 Click Score: 24   End of Session    Activity Tolerance: Patient tolerated treatment well Patient left: in bed;with call bell/phone within reach  OT Visit Diagnosis: Unsteadiness on feet (R26.81)                Time: 2119-4174 OT Time Calculation (min): 22 min Charges:  OT General Charges $OT Visit: 1 Visit  11/16/2020  Luan Pulling, OTR/L  Acute Rehabilitation Services  Office:  [phone]   Suzanna Obey 11/16/2020, 4:24 PM

## 2020-11-16 NOTE — Plan of Care (Signed)
  Problem: Education: Goal: Knowledge of General Education information will improve Description: Including pain rating scale, medication(s)/side effects and non-pharmacologic comfort measures Outcome: Progressing   Problem: Clinical Measurements: Goal: Ability to maintain clinical measurements within normal limits will improve Outcome: Progressing Goal: Will remain free from infection Outcome: Progressing Goal: Diagnostic test results will improve Outcome: Progressing Goal: Respiratory complications will improve Outcome: Progressing Goal: Cardiovascular complication will be avoided Outcome: Progressing   Problem: Pain Managment: Goal: General experience of comfort will improve Outcome: Progressing   Problem: Education: Goal: Ability to demonstrate management of disease process will improve Outcome: Progressing Goal: Ability to verbalize understanding of medication therapies will improve Outcome: Progressing Goal: Individualized Educational Video(s) Outcome: Progressing   Problem: Cardiac: Goal: Ability to achieve and maintain adequate cardiopulmonary perfusion will improve Outcome: Progressing

## 2020-11-16 NOTE — Progress Notes (Signed)
Heart Failure Stewardship Pharmacist Progress Note   PCP: Patient, No Pcp Per (Inactive) PCP-Cardiologist: None    HPI:  45 yo M with PMH of HFrEF (initially diagnosed in 2018 but did not follow up with cardiologist), HTN, morbid obesity, OSA on CPAP, and T2DM. He presented to the ED on 11/15/20 with shortness of breath and hypoxemic respiratory failure. CXR on admission showed low lung volumes with pulmonary vascular congestion. CTA completed afterward and was negative for PE as well as progressive cardiomegaly with LV dilation (concern for cardiac sarcoidosis). An ECHO was done on 11/16/20 and LVEF was 20-25% with mild RV systolic dysfunction.   Current HF Medications: Furosemide 40 mg IV daily Carvedilol 3.125 mg BID Losartan 25 mg daily  Prior to admission HF Medications: None  Pertinent Lab Values: . Serum creatinine 0.72, BUN 15, Potassium 4.7, Sodium 134, BNP 598.1, Magnesium 1.7   Vital Signs: . Weight: 397 lbs (admission weight: 397 lbs) . Blood pressure: 140/100s  . Heart rate: 100s   Medication Assistance / Insurance Benefits Check: Does the patient have prescription insurance?  No Can enroll in patient assistance applications for Entresto / [id number] as these are added for HFrEF  Outpatient Pharmacy:  Prior to admission outpatient pharmacy: [facility] Is the patient willing to use [facility] pharmacy at discharge? Yes   Assessment: 1. Acute on chronic systolic CHF (EF 92-33%), due to likely multifactorial causes - possible sarcoidosis, medication noncompliance, etc. NYHA class III symptoms. - Continue furosemide 40 mg IV daily - Continue carvedilol 3.125 mg BID - Consider optimizing losartan to Entresto 49/51 mg BID. Can help enroll him in patient assistance to receive this for free since he is uninsured. - Consider starting spironolactone prior to discharge (pending potassium levels).  -  Consider adding Farxiga 10 mg daily at discharge. Can help enroll  him in patient assistance to receive this for free since he is uninsured.   Plan: 1) Medication changes recommended at this time: - Stop losartan and start Entresto 49/51 mg BID - Add Farxiga 10 mg daily prior to discharge  2) Patient assistance: - Sherryll Burger and Farxiga patient assistance applications can be completed for him to receive these medications for free  3)  Education  - To be completed prior to discharge  Sharen Hones, PharmD, BCPS Heart Failure Stewardship Pharmacist Phone [phone]

## 2020-11-16 NOTE — Evaluation (Signed)
Physical Therapy Evaluation Patient Details Name: Marc Wright MRN: [id number] DOB: [birth year] Today's Date: 11/16/2020   History of Present Illness  Patient is a 45 y/o male who presents on 11/15/20 with SOB, cough, fever and, body aches. Chest CT- multifocal consolidation with mediastinal lymphadenopathy and some infiltrates concerning for sarcoid. Admitted with acute on chronic CHF, respiratory failure and HTN. PMH includes HFrEF 25-30%, HTN, morbid obesity, OSA on CPAP, DM2.  Clinical Impression  Patient presents with dyspnea on exertion and decreased cardiovascular endurance s/p above. 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 Today, pt tolerated bed mobility, transfers and ambulation with mod I-supervision for safety. Sp02 dropped to mid 80s briefly on RA during activity but able to rebound quickly with rest and pursed lip breathing. Swelling noted in BLEs, LLE>RLE. Pt reports feeling close to baseline. Encouraged increasing activity- walking to bathroom and back and hallway ambulation with nursing to maintain strength and improve endurance/oxygenation. Pt likely will not need home 02 pending he continues to progress activity/mobility. All education completed. Discharge from therapy.    Follow Up Recommendations No PT follow up    Equipment Recommendations  None recommended by PT    Recommendations for Other Services       Precautions / Restrictions Precautions Precautions: Other (comment) Precaution Comments: watch 02 Restrictions Weight Bearing Restrictions: No      Mobility  Bed Mobility Overal bed mobility: Modified Independent             General bed mobility comments: No assist needed, no use of rails, HOB elevated    Transfers Overall transfer level: Needs assistance Equipment used: None Transfers: Sit to/from Stand Sit to Stand: Modified independent (Device/Increase time)         General  transfer comment: Stood from EOB without difficulty. No dizziness.  Ambulation/Gait Ambulation/Gait assistance: Supervision Gait Distance (Feet): 500 Feet Assistive device: None Gait Pattern/deviations: Step-through pattern;Decreased stride length;Wide base of support Gait velocity: decreased   General Gait Details: Slow, steady gait with 2 brief standing rest breaks; Sp02 dropped to mid 80s for a brief period on RA but able to rebound quickly with rest and pursed lip breathing. 2/4 DOE.  Stairs            Wheelchair Mobility    Modified Rankin (Stroke Patients Only)       Balance Overall balance assessment: No apparent balance deficits (not formally assessed)                                           Pertinent Vitals/Pain Pain Assessment: Faces Faces Pain Scale: Hurts little more Pain Location: bottom from laying in bed Pain Descriptors / Indicators: Sore;Aching Pain Intervention(s): Monitored during session    Home Living Family/patient expects to be discharged to:: Private residence Living Arrangements: Alone   Type of Home: Other(Comment) (duplex) Home Access: Stairs to enter Entrance Stairs-Rails: Right Entrance Stairs-Number of Steps: 6 Home Layout: One level Home Equipment: Crutches      Prior Function Level of Independence: Independent         Comments: Drives, works as a Investment banker, 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 Does not wear 02. Does own ADLs/IADLs.     Hand Dominance   Dominant Hand: Right    Extremity/Trunk Assessment   Upper Extremity Assessment Upper Extremity Assessment: Defer to OT evaluation  Lower Extremity Assessment Lower Extremity Assessment: Overall WFL for tasks assessed ("weird feeling in toes" prob related to my diabetes)    Cervical / Trunk Assessment Cervical / Trunk Assessment: Normal  Communication   Communication: No difficulties  Cognition Arousal/Alertness: Awake/alert Behavior During Therapy: WFL for tasks  assessed/performed Overall Cognitive Status: Within Functional Limits for tasks assessed                                        General Comments General comments (skin integrity, edema, etc.): Sp02 dropped to mid 80s briefly during activity but able to rebound quickly with rest and pursed lip breathing. HR stable in low 100s. Swelling noted in BLEs, LLE>RLE.    Exercises     Assessment/Plan    PT Assessment Patent does not need any further PT services  PT Problem List         PT Treatment Interventions      PT Goals (Current goals can be found in the Care Plan section)  Acute Rehab PT Goals Patient Stated Goal: to get better and go home PT Goal Formulation: All assessment and education complete, DC therapy    Frequency     Barriers to discharge        Co-evaluation               AM-PAC PT "6 Clicks" Mobility  Outcome Measure Help needed turning from your back to your side while in a flat bed without using bedrails?: None Help needed moving from lying on your back to sitting on the side of a flat bed without using bedrails?: None Help needed moving to and from a bed to a chair (including a wheelchair)?: None Help needed standing up from a chair using your arms (e.g., wheelchair or bedside chair)?: None Help needed to walk in hospital room?: A Little Help needed climbing 3-5 steps with a railing? : A Little 6 Click Score: 22    End of Session   Activity Tolerance: Treatment limited secondary to medical complications (Comment);Patient tolerated treatment well (brief drop in SP02) Patient left: in bed;with call bell/phone within reach Nurse Communication: Mobility status PT Visit Diagnosis: Difficulty in walking, not elsewhere classified (R26.2)    Time: 4158-3094 PT Time Calculation (min) (ACUTE ONLY): 27 min   Charges:   PT Evaluation $PT Eval Moderate Complexity: 1 Mod PT Treatments $Gait Training: 8-22 mins        Vale Haven, PT,  DPT Acute Rehabilitation Services Pager [phone] Office [phone]      Blake Divine A Elanora Quin 11/16/2020, 11:59 AM

## 2020-11-16 NOTE — ED Notes (Signed)
Called report to [id number]. [facility] transporting patient.

## 2020-11-16 NOTE — ED Notes (Signed)
[facility] picking [facility]

## 2020-11-16 NOTE — Progress Notes (Addendum)
Inpatient Diabetes Program Recommendations  AACE/ADA: New Consensus Statement on Inpatient Glycemic Control (2015)  Target Ranges:  Prepandial:   less than 140 mg/dL      Peak postprandial:   less than 180 mg/dL (1-2 hours)      Critically ill patients:  140 - 180 mg/dL   Lab Results  Component Value Date   GLUCAP 353 (H) 11/16/2020   HGBA1C 13.3 (H) 11/07/2018    Review of Glycemic Control Results for Marc Wright, Marc Wright (MRN [id number]) as of 11/16/2020 14:23  Ref. Range 11/16/2020 00:15 11/16/2020 04:28 11/16/2020 08:05 11/16/2020 11:17  Glucose-Capillary Latest Ref Range: 70 - 99 mg/dL 829 (H) 562 (H) 130 (H) 353 (H)   Diabetes history: DM 2 Outpatient Diabetes medications:  Metformin (patient was not taking) Current orders for Inpatient glycemic control:  Novolog moderate q 4 hours Metformin 500 mg bid Inpatient Diabetes Program Recommendations:    Reviewed chart.  Patient was hospitalized in May of 2020 and patient was discharge on 70/30 insulin.  It appears that patient has not been taking insulin recently and needs to be restarted. it does not appear that patient has medication insurance coverage so likely needs affordable insulin at discharge. Will discuss with patient.   Thanks,  Beryl Meager, RN, BC-ADM Inpatient Diabetes Coordinator Pager [phone] (8a-5p)  Addendum:  Spoke with patient at bedside.  He states that he only took insulin for about 3 weeks after being hospitalized in 2020.  Reminded patient of his A1C in 2020 and the likely need for insulin now. Patient has not been monitoring or taking any medications in the past 1 and 1/2 years. Discussed that he can purchase supplies and insulin from [facility] for a reduced price.  We talked about goal blood sugars of 80-130 mg/dL fasting and <952 mg/dL. Also discussed importance of managing diabetes long-term.  He does not have insurance and states that he works, however has very little money at the end of the month.  Recommended  that he f/u at the [facility] for medications and care.

## 2020-11-16 NOTE — Progress Notes (Addendum)
Specimen for  sputum exam sent to laboratory.

## 2020-11-17 ENCOUNTER — Inpatient Hospital Stay (HOSPITAL_COMMUNITY): Payer: Medicaid Other

## 2020-11-17 LAB — GLUCOSE, CAPILLARY
Glucose-Capillary: 159 mg/dL — ABNORMAL HIGH (ref 70–99)
Glucose-Capillary: 179 mg/dL — ABNORMAL HIGH (ref 70–99)
Glucose-Capillary: 189 mg/dL — ABNORMAL HIGH (ref 70–99)
Glucose-Capillary: 241 mg/dL — ABNORMAL HIGH (ref 70–99)
Glucose-Capillary: 276 mg/dL — ABNORMAL HIGH (ref 70–99)
Glucose-Capillary: 277 mg/dL — ABNORMAL HIGH (ref 70–99)
Glucose-Capillary: 283 mg/dL — ABNORMAL HIGH (ref 70–99)
Glucose-Capillary: 292 mg/dL — ABNORMAL HIGH (ref 70–99)

## 2020-11-17 LAB — COMPREHENSIVE METABOLIC PANEL
ALT: 29 U/L (ref 0–44)
AST: 17 U/L (ref 15–41)
Albumin: 3.1 g/dL — ABNORMAL LOW (ref 3.5–5.0)
Alkaline Phosphatase: 99 U/L (ref 38–126)
Anion gap: 9 (ref 5–15)
BUN: 17 mg/dL (ref 6–20)
CO2: 34 mmol/L — ABNORMAL HIGH (ref 22–32)
Calcium: 9.1 mg/dL (ref 8.9–10.3)
Chloride: 95 mmol/L — ABNORMAL LOW (ref 98–111)
Creatinine, Ser: 0.85 mg/dL (ref 0.61–1.24)
GFR, Estimated: >60 mL/min (ref >60)
Glucose, Bld: 156 mg/dL — ABNORMAL HIGH (ref 70–99)
Potassium: 3.8 mmol/L (ref 3.5–5.1)
Sodium: 138 mmol/L (ref 135–145)
Total Bilirubin: 0.7 mg/dL (ref 0.3–1.2)
Total Protein: 6.2 g/dL — ABNORMAL LOW (ref 6.5–8.1)

## 2020-11-17 LAB — ANA W/REFLEX IF POSITIVE: Anti Nuclear Antibody (ANA): NEGATIVE

## 2020-11-17 LAB — TROPONIN I (HIGH SENSITIVITY)
Troponin I (High Sensitivity): 24 ng/L — ABNORMAL HIGH (ref <18)
Troponin I (High Sensitivity): 29 ng/L — ABNORMAL HIGH (ref <18)

## 2020-11-17 LAB — ANGIOTENSIN CONVERTING ENZYME
Angiotensin-Converting Enzyme: 107 U/L — ABNORMAL HIGH (ref 14–82)
Angiotensin-Converting Enzyme: 114 U/L — ABNORMAL HIGH (ref 14–82)

## 2020-11-17 LAB — MAGNESIUM: Magnesium: 1.7 mg/dL (ref 1.7–2.4)

## 2020-11-17 MED ORDER — SPIRONOLACTONE 25 MG PO TABS
25.0000 mg | ORAL_TABLET | Freq: Every day | ORAL | Status: DC
  Administered 2020-11-17 – 2020-11-21 (×5): 25 mg via ORAL
  Filled 2020-11-17 (×5): qty 1

## 2020-11-17 MED ORDER — SACUBITRIL-VALSARTAN 24-26 MG PO TABS
1.0000 | ORAL_TABLET | Freq: Two times a day (BID) | ORAL | Status: DC
  Administered 2020-11-17 – 2020-11-18 (×4): 1 via ORAL
  Filled 2020-11-17 (×4): qty 1

## 2020-11-17 MED ORDER — NITROGLYCERIN 0.4 MG SL SUBL: 0.4000 mg | SUBLINGUAL_TABLET | SUBLINGUAL | Status: DC | PRN

## 2020-11-17 MED ORDER — NITROGLYCERIN 0.4 MG SL SUBL
SUBLINGUAL_TABLET | SUBLINGUAL | Status: AC
  Administered 2020-11-17: 0.4 mg
  Filled 2020-11-17: qty 1

## 2020-11-17 MED ORDER — NITROGLYCERIN 0.4 MG/SPRAY TL SOLN: 1.0000 | Status: DC | PRN

## 2020-11-17 NOTE — Hospital Course (Addendum)
BUNs/creatinine 18/0.6---> 21/0.6 Hemoglobin 17.6 WBC 10.5   Cardiac meds for discharge:  spironolactone 25 mg daily,  Entresto 24/26 bid,  Coreg 3.125 bid,  digoxin 0.125 daily,  ASA 81 daily,  Lasix 40 mg daily,  KCl 20 mEq daily,  Crestor 20 mg daily, prednisone and MTX per pulmonary.   --Continue prednisone 60 mg daily and Bactrim MWF for PJP ppx  --Continue MTX and folate, increase to 20 mg weekly  --Agree with outpatient cardiac PET - if not active inflammation and more related to fibrosis would favor rapid decrease to prednisone 20 mg daily x 3 months with subsequent taper  to achieve pulmonary remission of infiltrates with MTX long term; if active inflammation on PET then higher doses of steroid and longer taper would be warranted  --Agree with ICD insertion  --Will arrange pulmonary follow up in the next month

## 2020-11-17 NOTE — Progress Notes (Signed)
Tentatively put on cath add on board for Tues 5/31. Per d/w Dr. Jens Som, orders written and he will plan to discuss cath with pt. I will stop metformin while he is inpatient in anticipation of cath.

## 2020-11-17 NOTE — Progress Notes (Signed)
CPAP set up for pt on 6 cmH2O with 2L bleed in, Pt immediately took mask off stating he could not tolerate it due to being congested. I told him to call RT if he wants to try again tonight.

## 2020-11-17 NOTE — Progress Notes (Signed)
Patient reporting new onset chest pain. 4/10 with pain in center of chest and some shortness of breath after sitting up in the chair for a brief time.  2L Frost placed on patient and 12 lead performed. QTc elongated at 630 and patient feels warm and appears diaphoretic. CBG checked with result of 292. Attending MD notified

## 2020-11-17 NOTE — Progress Notes (Addendum)
Called to see patient for chest discomfort.  Patient sitting in bed and denies dyspnea.  He describes chest pressure like "a muscle being sore".  He feels as though the pain is "mild".  It is not radiating.  Question increased with inspiration.  Blood pressure 130/100 saturations 96% Electrocardiogram shows sinus rhythm, left atrial enlargement and left bundle branch block unchanged. Etiology of chest pain unclear.  Patient will be given 1 sublingual nitroglycerin.  We will cycle enzymes.  He does not appear to be significantly uncomfortable.  We will follow. Also note he is on DVT prophylaxis lovenox and PE seems less likely. Olga Millers, MD

## 2020-11-17 NOTE — Progress Notes (Signed)
PROGRESS NOTE   Marc Wright  MBW:[id number] DOB: [birth year] DOA: 11/15/2020 PCP: Patient, No Pcp Per (Inactive)  Brief Narrative:  72 white male BMI 49 HFrEF 25% OSA noncompliant on CPAP DM TY 2 Current tobacco abuse half pack per day Occasional THC remote cocaine nothing recently Patient reportedly noncompliant on meds/CPAP  Present [facility] ED sore throat sputum difficulty breathing-at home self treated with several dose of Lasix Baseline does have orthopnea but has worsened over past several weeks  Rx in ED Lasix given BNP was 500 lactic acid normal CXR = right hilar opacity CT showing multifocal consolidation  Cardiology and pulmonology consulted  Hospital-Problem based course  Acute decompensated HFrEF EF 25% Current meds: Lasix 40 twice daily Coreg 3.125  Aldactone 25, Entresto- cardiology adjusting Net I/O: = -2.6 L Reinforced fluid restriction Watch bicarb closely given diuresis ?  Cardiac cath on Tuesday CAP Pneumonia Continue doxycycline only-stop ceftriaxone 5/28 CXR a.m. 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 = infiltrative pattern with akinesis EF 20-25% Follow ACE level ANA done on 5/27 ESR and CRP elevated Pulmonology indicates outpatient sarcoid work-up Hyperglycemia without diagnosis of diabetes mellitus Follow A1c Sliding scale for now-metformin 500 twice daily on discharge for weight loss benefits glycemic control Smoker Stop steroids-not wheezing-not severely immunocompromised     DVT prophylaxis: Lovenox Code Status: Full Family Communication: Brother not present today updated patient Disposition:  Status is: Inpatient  Remains inpatient appropriate because:Persistent severe electrolyte disturbances, Unsafe d/c plan and IV treatments appropriate due to intensity of illness or inability to take PO   Dispo: The patient is from: Home              Anticipated d/c is to: Home               Patient currently is not medically stable to d/c.   Difficult to place patient No       Consultants:   Cardiology  Pulmonology  Procedures: Multiple  Antimicrobials: As above   Subjective:  Overall much better breathing less heavy Passing good urine No chest pain no fever Tolerating positive pressure at night  Objective: Vitals:   11/16/20 2001 11/17/20 0007 11/17/20 0358 11/17/20 0844  BP: (!) 128/99 (!) 141/101 132/84 (!) 137/99  Pulse: 95 90 98 95  Resp: _0 Temp: 98.3 F (36.8 C) 97.7 F (36.5 C) 97.6 F (36.4 C)   TempSrc: Oral Oral    SpO2: 94% 92% 96%   Weight:  (!) 171.1 kg    Height:        Intake/Output Summary (Last 24 hours) at 11/17/2020 1130 Last data filed at 11/17/2020 1000 Gross per 24 hour  Intake 1336.39 ml  Output 1725 ml  Net -388.61 ml   Filed Weights   11/16/20 0015 11/17/20 0007  Weight: (!) 180.5 kg (!) 171.1 kg    Examination:   thick neck Mallampati 4 no icterus no pallor Multiple tattoos Decreased air entry-decreased rales S1-S2 tachycardic heart rate 90s Edema seems improved Euthymic congruent Power 5/5  Data Reviewed: personally reviewed   CBC    Component Value Date/Time   WBC 9.6 11/16/2020 0421   RBC 6.05 (H) 11/16/2020 0421   HGB 17.1 (H) 11/16/2020 0421   HGB 15.6 11/24/2018 1204   HCT 51.7 11/16/2020 0421   HCT 48.9 11/24/2018 1204   PLT 226 11/16/2020 0421   PLT 562 (H) 11/24/2018 1204   MCV 85.5 11/16/2020 0421  MCV 88 11/24/2018 1204   MCH 28.3 11/16/2020 0421   MCHC 33.1 11/16/2020 0421   RDW 13.6 11/16/2020 0421   RDW 13.9 11/24/2018 1204   LYMPHSABS 0.7 11/16/2020 0421   LYMPHSABS 2.0 11/24/2018 1204   MONOABS 0.1 11/16/2020 0421   EOSABS 0.0 11/16/2020 0421   EOSABS 0.4 11/24/2018 1204   BASOSABS 0.0 11/16/2020 0421   BASOSABS 0.1 11/24/2018 1204   CMP Latest Ref Rng & Units 11/17/2020 11/16/2020 11/16/2020  Glucose 70 - 99 mg/dL 156(H) 411(H) 242(H)  BUN 6 - 20 mg/dL _0 Creatinine 0.61 - 1.24 mg/dL 0.85 0.72 0.66  Sodium 135 - 145 mmol/L 138 134(L) 137  Potassium 3.5 - 5.1 mmol/L 3.8 4.7 3.8  Chloride 98 - 111 mmol/L 95(L) 100 97(L)  CO2 22 - 32 mmol/L 34(H) 27 30  Calcium 8.9 - 10.3 mg/dL 9.1 8.8(L) 8.8(L)  Total Protein 6.5 - 8.1 g/dL 6.2(L) 6.7 -  Total Bilirubin 0.3 - 1.2 mg/dL 0.7 1.1 -  Alkaline Phos 38 - 126 U/L 99 126 -  AST 15 - 41 U/L 17 21 -  ALT 0 - 44 U/L 29 43 -     Radiology Studies: CT Angio Chest PE W and/or Wo Contrast  Result Date: 11/15/2020 CLINICAL DATA:  Abnormal chest x-ray, cough, fever EXAM: CT ANGIOGRAPHY CHEST WITH CONTRAST TECHNIQUE: Multidetector CT imaging of the chest was performed using the standard protocol during bolus administration of intravenous contrast. Multiplanar CT image reconstructions and MIPs were obtained to evaluate the vascular anatomy. CONTRAST:  14m OMNIPAQUE IOHEXOL 350 MG/ML SOLN COMPARISON:  11/06/2018 FINDINGS: Cardiovascular: There is adequate opacification of the pulmonary arterial tree. No intraluminal filling defect identified to suggest acute pulmonary embolism. The central pulmonary arteries are of normal caliber. Mild coronary artery calcification. There is mild global cardiomegaly with left ventricular dilation noted. No pericardial effusion. The thoracic aorta is unremarkable. Mediastinum/Nodes: There is extensive mediastinal and bilateral hilar adenopathy present, possibly reactive or inflammatory in nature. This appears similar to that noted on prior examination. Thyroid unremarkable. Esophagus unremarkable. Lungs/Pleura: There are multiple regions of pulmonary consolidation involving the superior segments of the lower lobes bilaterally, the central right middle lobe, and peripherally within the left upper lobe. This appears similar, though progressive since prior examination both in appearance and location. Small right pleural effusion is present. No pneumothorax. Central airways are widely  patent. Upper Abdomen: No acute abnormality. Specifically, the spleen does not appear enlarged. Musculoskeletal: No lytic or blastic bone lesion is identified. No acute bone abnormality. Review of the MIP images confirms the above findings. IMPRESSION: No pulmonary embolism. Multifocal pulmonary consolidation similar in appearance though progressive since remote prior examination of 11/06/2018, with extensive mediastinal and bilateral hilar adenopathy. Given its chronicity, differential considerations are led by granulomatous and immunologic conditions such as sarcoidosis or granulomatosis with polyangiitis. Progressive cardiomegaly now with left ventricular dilation. Given the above findings, cardiac sarcoidosis could be considered. Electronically Signed   By: AFidela SalisburyMD   On: 11/15/2020 21:28   DG Chest Port 1 View  Result Date: 11/15/2020 CLINICAL DATA:  Shortness of breath with cough and fever. EXAM: PORTABLE CHEST 1 VIEW COMPARISON:  11/06/2018 FINDINGS: 1657 hours. Low lung volumes. The cardio pericardial silhouette is enlarged. There is pulmonary vascular congestion without overt pulmonary edema. Right hilar and infrahilar opacity noted, progressive in the interval. Telemetry leads overlie the chest. IMPRESSION: 1. Low lung volumes with pulmonary vascular congestion. Component of interstitial edema not  excluded. 2. Progressive opacity in the right hilar and infrahilar region. All imaging features may be related to infectious/inflammatory etiology, neoplasm could have this appearance. CT chest with contrast recommended to further evaluate. Electronically Signed   By: Misty Stanley M.D.   On: 11/15/2020 17:21   ECHOCARDIOGRAM COMPLETE  Result Date: 11/16/2020    ECHOCARDIOGRAM REPORT   Patient Name:   Marc Wright Date of Exam: 11/16/2020 Medical Rec #:  [id number]     Height:       75.0 in Accession #:    [id number]    Weight:       397.9 lb Date of Birth:  [birth year]     BSA:          2.940 m  Patient Age:    80 years      BP:           131/98 mmHg Patient Gender: M             HR:           100 bpm. Exam Location:  Inpatient Procedure: 2D Echo, Cardiac Doppler and Color Doppler Indications:    Cardiac Sarcoidosis D86.85                 CHF-Acute Systolic Q91.69  History:        Patient has prior history of Echocardiogram examinations, most                 recent 06/27/2016. CHF; Risk Factors:Diabetes.  Sonographer:    Bernadene Person RDCS Referring Phys: [place]  1. Akinesis noted of the basal to mid septum and basal to mid inferior wall. There is also septal movement consistent with LBBB. Unusual pattern of akinesis for coronary distribution noted. Would consider infiltrative cardiomyopathy such as sarcoidosis and would recommend a cardiac MRI for clarification. Left ventricular ejection fraction, by estimation, is 20 to 25%. The left ventricle has severely decreased function. The left ventricle demonstrates regional wall motion abnormalities (see scoring diagram/findings for description). The left ventricular internal cavity size was moderately dilated. There is moderate concentric left ventricular hypertrophy. Indeterminate diastolic filling due to E-A fusion.  2. Right ventricular systolic function is mildly reduced. The right ventricular size is mildly enlarged. There is mildly elevated pulmonary artery systolic pressure. The estimated right ventricular systolic pressure is 45.0 mmHg.  3. The mitral valve is grossly normal. Trivial mitral valve regurgitation. No evidence of mitral stenosis.  4. The aortic valve is tricuspid. Aortic valve regurgitation is not visualized. No aortic stenosis is present.  5. The inferior vena cava is dilated in size with <50% respiratory variability, suggesting right atrial pressure of 15 mmHg. Comparison(s): No significant change from prior study. EF remains similar ~20-25%. WMAs noted above. FINDINGS  Left Ventricle: Akinesis noted of the  basal to mid septum and basal to mid inferior wall. There is also septal movement consistent with LBBB. Unusual pattern of akinesis for coronary distribution noted. Would consider infiltrative cardiomyopathy such as  sarcoidosis and would recommend a cardiac MRI for clarification. Left ventricular ejection fraction, by estimation, is 20 to 25%. The left ventricle has severely decreased function. The left ventricle demonstrates regional wall motion abnormalities. The  left ventricular internal cavity size was moderately dilated. There is moderate concentric left ventricular hypertrophy. Abnormal (paradoxical) septal motion, consistent with left bundle branch block. Indeterminate diastolic filling due to E-A fusion.  LV Wall Scoring: The inferior wall, basal anteroseptal segment, mid inferoseptal segment,  and basal inferoseptal segment are akinetic. Right Ventricle: The right ventricular size is mildly enlarged. No increase in right ventricular wall thickness. Right ventricular systolic function is mildly reduced. There is mildly elevated pulmonary artery systolic pressure. The tricuspid regurgitant  velocity is 2.64 m/s, and with an assumed right atrial pressure of 15 mmHg, the estimated right ventricular systolic pressure is 42.7 mmHg. Left Atrium: Left atrial size was normal in size. Right Atrium: Right atrial size was normal in size. Pericardium: Trivial pericardial effusion is present. Mitral Valve: The mitral valve is grossly normal. Trivial mitral valve regurgitation. No evidence of mitral valve stenosis. Tricuspid Valve: The tricuspid valve is grossly normal. Tricuspid valve regurgitation is mild . No evidence of tricuspid stenosis. Aortic Valve: The aortic valve is tricuspid. Aortic valve regurgitation is not visualized. No aortic stenosis is present. Pulmonic Valve: The pulmonic valve was grossly normal. Pulmonic valve regurgitation is not visualized. No evidence of pulmonic stenosis. Aorta: The aortic root  and ascending aorta are structurally normal, with no evidence of dilitation. Venous: The inferior vena cava is dilated in size with less than 50% respiratory variability, suggesting right atrial pressure of 15 mmHg. IAS/Shunts: The atrial septum is grossly normal. Additional Comments: A device lead is visualized in the right ventricle.  LEFT VENTRICLE PLAX 2D LVIDd:         6.50 cm LVIDs:         5.70 cm LV PW:         1.40 cm LV IVS:        1.40 cm LVOT diam:     2.30 cm LV SV:         63 LV SV Index:   21 LVOT Area:     4.15 cm  LV Volumes (MOD) LV vol d, MOD A2C: 292.0 ml LV vol d, MOD A4C: 234.0 ml LV vol s, MOD A2C: 225.0 ml LV vol s, MOD A4C: 153.0 ml LV SV MOD A2C:     67.0 ml LV SV MOD A4C:     234.0 ml LV SV MOD BP:      71.6 ml RIGHT VENTRICLE RV S prime:     8.33 cm/s TAPSE (M-mode): 1.9 cm LEFT ATRIUM             Index       RIGHT ATRIUM           Index LA diam:        5.10 cm 1.73 cm/m  RA Area:     28.60 cm LA Vol (A2C):   67.2 ml 22.86 ml/m RA Volume:   106.00 ml 36.06 ml/m LA Vol (A4C):   67.0 ml 22.79 ml/m LA Biplane Vol: 73.4 ml 24.97 ml/m  AORTIC VALVE LVOT Vmax:   100.90 cm/s LVOT Vmean:  68.633 cm/s LVOT VTI:    0.152 m  AORTA Ao Root diam: 3.58 cm Ao Asc diam:  3.70 cm TRICUSPID VALVE TR Peak grad:   27.9 mmHg TR Vmax:        264.00 cm/s  SHUNTS Systemic VTI:  0.15 m Systemic Diam: 2.30 cm Eleonore Chiquito MD Electronically signed by Eleonore Chiquito MD Signature Date/Time: 11/16/2020/1:57:34 PM    Final    VAS [place] LOWER EXTREMITY VENOUS (DVT)  Result Date: 11/16/2020  Lower Venous DVT Study Patient Name:  Marc Wright  Date of Exam:   11/16/2020 Medical Rec #: [id number]      Accession #:    [id number] Date of Birth: 04-07-1976  Patient Gender: M Patient Age:   28Y Exam Location:  [facility] Procedure:      VAS [place] LOWER EXTREMITY VENOUS (DVT) Referring Phys: [id number] ANASTASSIA DOUTOVA --------------------------------------------------------------------------------  Indications:  Edema.  Risk Factors: Chronic CHF - noncompliant. Comparison Study: Previous 12/19/18 Negative Performing Technologist: Rogelia Rohrer  Examination Guidelines: A complete evaluation includes B-mode imaging, spectral Doppler, color Doppler, and power Doppler as needed of all accessible portions of each vessel. Bilateral testing is considered an integral part of a complete examination. Limited examinations for reoccurring indications may be performed as noted. The reflux portion of the exam is performed with the patient in reverse Trendelenburg.  +---------+---------------+---------+-----------+----------+--------------+ RIGHT    CompressibilityPhasicitySpontaneityPropertiesThrombus Aging +---------+---------------+---------+-----------+----------+--------------+ CFV      Full           Yes      Yes                                 +---------+---------------+---------+-----------+----------+--------------+ SFJ      Full                                                        +---------+---------------+---------+-----------+----------+--------------+ FV Prox  Full           Yes      Yes                                 +---------+---------------+---------+-----------+----------+--------------+ FV Mid   Full           Yes      Yes                                 +---------+---------------+---------+-----------+----------+--------------+ FV DistalFull           Yes      Yes                                 +---------+---------------+---------+-----------+----------+--------------+ PFV      Full                                                        +---------+---------------+---------+-----------+----------+--------------+ POP      Full           Yes      Yes                                 +---------+---------------+---------+-----------+----------+--------------+ PTV      Full                                                         +---------+---------------+---------+-----------+----------+--------------+ PERO     Full                                                        +---------+---------------+---------+-----------+----------+--------------+   +---------+---------------+---------+-----------+----------+--------------+  LEFT     CompressibilityPhasicitySpontaneityPropertiesThrombus Aging +---------+---------------+---------+-----------+----------+--------------+ CFV      Full           Yes      Yes                                 +---------+---------------+---------+-----------+----------+--------------+ SFJ      Full                                                        +---------+---------------+---------+-----------+----------+--------------+ FV Prox  Full           Yes      Yes                                 +---------+---------------+---------+-----------+----------+--------------+ FV Mid   Full           Yes      Yes                                 +---------+---------------+---------+-----------+----------+--------------+ FV DistalFull           Yes      Yes                                 +---------+---------------+---------+-----------+----------+--------------+ PFV      Full                                                        +---------+---------------+---------+-----------+----------+--------------+ POP      Full           Yes      Yes                                 +---------+---------------+---------+-----------+----------+--------------+ PTV      Full                                                        +---------+---------------+---------+-----------+----------+--------------+ PERO     Full                                                        +---------+---------------+---------+-----------+----------+--------------+     Summary: BILATERAL: - No evidence of deep vein thrombosis seen in the lower extremities, bilaterally. - No evidence of  superficial venous thrombosis in the lower extremities, bilaterally. -No evidence of popliteal cyst, bilaterally.   *See table(s) above for measurements and observations. Electronically signed by Monica Martinez MD on 11/16/2020 at 12:40:12 PM.    Final  Scheduled Meds: . carvedilol  3.125 mg Oral BID WC  . doxycycline  100 mg Oral Q12H  . enoxaparin (LOVENOX) injection  90 mg Subcutaneous Q24H  . furosemide  40 mg Intravenous BID  . insulin aspart  0-15 Units Subcutaneous Q4H  . nicotine  21 mg Transdermal Daily  . sacubitril-valsartan  1 tablet Oral BID  . sodium chloride flush  3 mL Intravenous Q12H  . spironolactone  25 mg Oral Daily   Continuous Infusions: . sodium chloride    . cefTRIAXone (ROCEPHIN)  IV 200 mL/hr at 11/16/20 1802     LOS: 2 days   Time spent: 27 minutes  Nita Sells, MD [facility] To contact the attending provider between 7A-7P or the covering provider during after hours 7P-7A, please log into the web site [email or web address] and access using universal [place] password for that web site. If you do not have the password, please call the hospital operator.  11/17/2020, 11:30 AM

## 2020-11-17 NOTE — Progress Notes (Signed)
Progress Note  Patient Name: Marc Wright Date of Encounter: 11/17/2020  [facility] Cardiologist: Dorothyann Pengwayne Callwood  Subjective   No CP; dyspnea improving  Inpatient Medications    Scheduled Meds: . carvedilol  3.125 mg Oral BID WC  . doxycycline  100 mg Oral Q12H  . enoxaparin (LOVENOX) injection  90 mg Subcutaneous Q24H  . furosemide  40 mg Intravenous BID  . insulin aspart  0-15 Units Subcutaneous Q4H  . losartan  25 mg Oral Daily  . [START ON 11/18/2020] metFORMIN  500 mg Oral BID WC  . nicotine  21 mg Transdermal Daily  . sodium chloride flush  3 mL Intravenous Q12H   Continuous Infusions: . sodium chloride    . cefTRIAXone (ROCEPHIN)  IV 200 mL/hr at 11/16/20 1802   PRN Meds: sodium chloride, acetaminophen **OR** acetaminophen, HYDROcodone-acetaminophen, sodium chloride flush   Vital Signs    Vitals:   11/16/20 1714 11/16/20 2001 11/17/20 0007 11/17/20 0358  BP: (!) 142/98 (!) 128/99 (!) 141/101 132/84  Pulse: 92 95 90 98  Resp: 18 15 17 17   Temp: 98.1 F (36.7 C) 98.3 F (36.8 C) 97.7 F (36.5 C) 97.6 F (36.4 C)  TempSrc: Oral Oral Oral   SpO2: 94% 94% 92% 96%  Weight:   (!) 171.1 kg   Height:        Intake/Output Summary (Last 24 hours) at 11/17/2020 [id number] Last data filed at 11/17/2020 0403 Gross per 24 hour  Intake 919.39 ml  Output 225 ml  Net 694.39 ml   Last 3 Weights 11/17/2020 11/16/2020 06/21/2020  Weight (lbs) 377 lb 4.8 oz 397 lb 14.9 oz 398 lb  Weight (kg) 171.142 kg 180.5 kg 180.532 kg      Telemetry    Sinus with first degree AV block - Personally Reviewed  Physical Exam   GEN: No acute distress.  Obese  Neck: No JVD Cardiac: RRR Respiratory: Clear to auscultation bilaterally. GI: Soft, nontender, non-distended  MS: No edema Neuro:  Nonfocal  Psych: Normal affect   Labs    High Sensitivity Troponin:   Recent Labs  Lab 11/15/20 1556 11/15/20 1756 11/16/20 0009 11/16/20 0421  TROPONINIHS 35* 30* 37* 25*  23*       Chemistry Recent Labs  Lab 11/15/20 1556 11/16/20 0009 11/16/20 0421 11/17/20 0155  NA 135 137 134* 138  K 5.4* 3.8 4.7 3.8  CL 100 97* 100 95*  CO2 29 30 27  34*  GLUCOSE 390* 242* 411* 156*  BUN 16 14 15 17   CREATININE 0.71 0.66 0.72 0.85  CALCIUM 8.9 8.8* 8.8* 9.1  PROT 7.0  --  6.7 6.2*  ALBUMIN 3.5  --  3.4* 3.1*  AST 51*  --  21 17  ALT 44  --  43 29  ALKPHOS 128*  --  126 99  BILITOT 0.9  --  1.1 0.7  GFRNONAA >60 >60 >60 >60  ANIONGAP 6 10 7 9      Hematology Recent Labs  Lab 11/15/20 1556 11/16/20 0421  WBC 7.9 9.6  RBC 5.92* 6.05*  HGB 16.5 17.1*  HCT 50.9 51.7  MCV 86.0 85.5  MCH 27.9 28.3  MCHC 32.4 33.1  RDW 13.7 13.6  PLT 229 226    BNP Recent Labs  Lab 11/15/20 1556  BNP 598.1*     Radiology    CT Angio Chest PE W and/or Wo Contrast  Result Date: 11/15/2020 CLINICAL DATA:  Abnormal chest x-ray, cough, fever EXAM: CT  ANGIOGRAPHY CHEST WITH CONTRAST TECHNIQUE: Multidetector CT imaging of the chest was performed using the standard protocol during bolus administration of intravenous contrast. Multiplanar CT image reconstructions and MIPs were obtained to evaluate the vascular anatomy. CONTRAST:  OMNIPAQUE IOHEXOL 350 MG/ML SOLN COMPARISON:  11/06/2018 FINDINGS: Cardiovascular: There is adequate opacification of the pulmonary arterial tree. No intraluminal filling defect identified to suggest acute pulmonary embolism. The central pulmonary arteries are of normal caliber. Mild coronary artery calcification. There is mild global cardiomegaly with left ventricular dilation noted. No pericardial effusion. The thoracic aorta is unremarkable. Mediastinum/Nodes: There is extensive mediastinal and bilateral hilar adenopathy present, possibly reactive or inflammatory in nature. This appears similar to that noted on prior examination. Thyroid unremarkable. Esophagus unremarkable. Lungs/Pleura: There are multiple regions of pulmonary consolidation involving  the superior segments of the lower lobes bilaterally, the central right middle lobe, and peripherally within the left upper lobe. This appears similar, though progressive since prior examination both in appearance and location. Small right pleural effusion is present. No pneumothorax. Central airways are widely patent. Upper Abdomen: No acute abnormality. Specifically, the spleen does not appear enlarged. Musculoskeletal: No lytic or blastic bone lesion is identified. No acute bone abnormality. Review of the MIP images confirms the above findings. IMPRESSION: No pulmonary embolism. Multifocal pulmonary consolidation similar in appearance though progressive since remote prior examination of 11/06/2018, with extensive mediastinal and bilateral hilar adenopathy. Given its chronicity, differential considerations are led by granulomatous and immunologic conditions such as sarcoidosis or granulomatosis with polyangiitis. Progressive cardiomegaly now with left ventricular dilation. Given the above findings, cardiac sarcoidosis could be considered. Electronically Signed   By: Helyn Numbers MD   On: 11/15/2020 21:28   DG Chest Port 1 View  Result Date: 11/15/2020 CLINICAL DATA:  Shortness of breath with cough and fever. EXAM: PORTABLE CHEST 1 VIEW COMPARISON:  11/06/2018 FINDINGS: 1657 hours. Low lung volumes. The cardio pericardial silhouette is enlarged. There is pulmonary vascular congestion without overt pulmonary edema. Right hilar and infrahilar opacity noted, progressive in the interval. Telemetry leads overlie the chest. IMPRESSION: 1. Low lung volumes with pulmonary vascular congestion. Component of interstitial edema not excluded. 2. Progressive opacity in the right hilar and infrahilar region. All imaging features may be related to infectious/inflammatory etiology, neoplasm could have this appearance. CT chest with contrast recommended to further evaluate. Electronically Signed   By: Marc Center M.D.   On:  11/15/2020 17:21   ECHOCARDIOGRAM COMPLETE  Result Date: 11/16/2020    ECHOCARDIOGRAM REPORT   Patient Name:   LADARIOUS KRESSE Date of Exam: 11/16/2020 Medical Rec #:  [id number]     Height:       75.0 in Accession #:    [id number]    Weight:       397.9 lb Date of Birth:  1975-10-26     BSA:          2.940 m Patient Age:    45 years      BP:           131/98 mmHg Patient Gender: M             HR:           100 bpm. Exam Location:  Inpatient Procedure: 2D Echo, Cardiac Doppler and Color Doppler Indications:    Cardiac Sarcoidosis D86.85                 CHF-Acute Systolic I50.21  History:  Patient has prior history of Echocardiogram examinations, most                 recent 06/27/2016. CHF; Risk Factors:Diabetes.  Sonographer:    Eulah Pont RDCS Referring Phys: Consepcion Hearing ANASTASSIA DOUTOVA IMPRESSIONS  1. Akinesis noted of the basal to mid septum and basal to mid inferior wall. There is also septal movement consistent with LBBB. Unusual pattern of akinesis for coronary distribution noted. Would consider infiltrative cardiomyopathy such as sarcoidosis and would recommend a cardiac MRI for clarification. Left ventricular ejection fraction, by estimation, is 20 to 25%. The left ventricle has severely decreased function. The left ventricle demonstrates regional wall motion abnormalities (see scoring diagram/findings for description). The left ventricular internal cavity size was moderately dilated. There is moderate concentric left ventricular hypertrophy. Indeterminate diastolic filling due to E-A fusion.  2. Right ventricular systolic function is mildly reduced. The right ventricular size is mildly enlarged. There is mildly elevated pulmonary artery systolic pressure. The estimated right ventricular systolic pressure is 42.9 mmHg.  3. The mitral valve is grossly normal. Trivial mitral valve regurgitation. No evidence of mitral stenosis.  4. The aortic valve is tricuspid. Aortic valve regurgitation is not visualized.  No aortic stenosis is present.  5. The inferior vena cava is dilated in size with <50% respiratory variability, suggesting right atrial pressure of 15 mmHg. Comparison(s): No significant change from prior study. EF remains similar ~20-25%. WMAs noted above. FINDINGS  Left Ventricle: Akinesis noted of the basal to mid septum and basal to mid inferior wall. There is also septal movement consistent with LBBB. Unusual pattern of akinesis for coronary distribution noted. Would consider infiltrative cardiomyopathy such as  sarcoidosis and would recommend a cardiac MRI for clarification. Left ventricular ejection fraction, by estimation, is 20 to 25%. The left ventricle has severely decreased function. The left ventricle demonstrates regional wall motion abnormalities. The  left ventricular internal cavity size was moderately dilated. There is moderate concentric left ventricular hypertrophy. Abnormal (paradoxical) septal motion, consistent with left bundle branch block. Indeterminate diastolic filling due to E-A fusion.  LV Wall Scoring: The inferior wall, basal anteroseptal segment, mid inferoseptal segment, and basal inferoseptal segment are akinetic. Right Ventricle: The right ventricular size is mildly enlarged. No increase in right ventricular wall thickness. Right ventricular systolic function is mildly reduced. There is mildly elevated pulmonary artery systolic pressure. The tricuspid regurgitant  velocity is 2.64 m/s, and with an assumed right atrial pressure of 15 mmHg, the estimated right ventricular systolic pressure is 42.9 mmHg. Left Atrium: Left atrial size was normal in size. Right Atrium: Right atrial size was normal in size. Pericardium: Trivial pericardial effusion is present. Mitral Valve: The mitral valve is grossly normal. Trivial mitral valve regurgitation. No evidence of mitral valve stenosis. Tricuspid Valve: The tricuspid valve is grossly normal. Tricuspid valve regurgitation is mild . No evidence  of tricuspid stenosis. Aortic Valve: The aortic valve is tricuspid. Aortic valve regurgitation is not visualized. No aortic stenosis is present. Pulmonic Valve: The pulmonic valve was grossly normal. Pulmonic valve regurgitation is not visualized. No evidence of pulmonic stenosis. Aorta: The aortic root and ascending aorta are structurally normal, with no evidence of dilitation. Venous: The inferior vena cava is dilated in size with less than 50% respiratory variability, suggesting right atrial pressure of 15 mmHg. IAS/Shunts: The atrial septum is grossly normal. Additional Comments: A device lead is visualized in the right ventricle.  LEFT VENTRICLE PLAX 2D LVIDd:  6.50 cm LVIDs:         5.70 cm LV PW:         1.40 cm LV IVS:        1.40 cm LVOT diam:     2.30 cm LV SV:         63 LV SV Index:   21 LVOT Area:     4.15 cm  LV Volumes (MOD) LV vol d, MOD A2C: 292.0 ml LV vol d, MOD A4C: 234.0 ml LV vol s, MOD A2C: 225.0 ml LV vol s, MOD A4C: 153.0 ml LV SV MOD A2C:     67.0 ml LV SV MOD A4C:     234.0 ml LV SV MOD BP:      71.6 ml RIGHT VENTRICLE RV S prime:     8.33 cm/s TAPSE (M-mode): 1.9 cm LEFT ATRIUM             Index       RIGHT ATRIUM           Index LA diam:        5.10 cm 1.73 cm/m  RA Area:     28.60 cm LA Vol (A2C):   67.2 ml 22.86 ml/m RA Volume:   106.00 ml 36.06 ml/m LA Vol (A4C):   67.0 ml 22.79 ml/m LA Biplane Vol: 73.4 ml 24.97 ml/m  AORTIC VALVE LVOT Vmax:   100.90 cm/s LVOT Vmean:  68.633 cm/s LVOT VTI:    0.152 m  AORTA Ao Root diam: 3.58 cm Ao Asc diam:  3.70 cm TRICUSPID VALVE TR Peak grad:   27.9 mmHg TR Vmax:        264.00 cm/s  SHUNTS Systemic VTI:  0.15 m Systemic Diam: 2.30 cm Lennie Odor MD Electronically signed by Lennie Odor MD Signature Date/Time: 11/16/2020/1:57:34 PM    Final    VAS [place] LOWER EXTREMITY VENOUS (DVT)  Result Date: 11/16/2020  Lower Venous DVT Study Patient Name:  CHADWIN FURY  Date of Exam:   11/16/2020 Medical Rec #: [id number]      Accession #:     [id number] Date of Birth: [birth year]      Patient Gender: M Patient Age:   41Y Exam Location:  [facility] Procedure:      VAS [place] LOWER EXTREMITY VENOUS (DVT) Referring Phys: [id number] ANASTASSIA DOUTOVA --------------------------------------------------------------------------------  Indications: Edema.  Risk Factors: Chronic CHF - noncompliant. Comparison Study: Previous 12/19/18 Negative Performing Technologist: Ernestene Mention  Examination Guidelines: A complete evaluation includes B-mode imaging, spectral Doppler, color Doppler, and power Doppler as needed of all accessible portions of each vessel. Bilateral testing is considered an integral part of a complete examination. Limited examinations for reoccurring indications may be performed as noted. The reflux portion of the exam is performed with the patient in reverse Trendelenburg.  +---------+---------------+---------+-----------+----------+--------------+ RIGHT    CompressibilityPhasicitySpontaneityPropertiesThrombus Aging +---------+---------------+---------+-----------+----------+--------------+ CFV      Full           Yes      Yes                                 +---------+---------------+---------+-----------+----------+--------------+ SFJ      Full                                                        +---------+---------------+---------+-----------+----------+--------------+  FV Prox  Full           Yes      Yes                                 +---------+---------------+---------+-----------+----------+--------------+ FV Mid   Full           Yes      Yes                                 +---------+---------------+---------+-----------+----------+--------------+ FV DistalFull           Yes      Yes                                 +---------+---------------+---------+-----------+----------+--------------+ PFV      Full                                                         +---------+---------------+---------+-----------+----------+--------------+ POP      Full           Yes      Yes                                 +---------+---------------+---------+-----------+----------+--------------+ PTV      Full                                                        +---------+---------------+---------+-----------+----------+--------------+ PERO     Full                                                        +---------+---------------+---------+-----------+----------+--------------+   +---------+---------------+---------+-----------+----------+--------------+ LEFT     CompressibilityPhasicitySpontaneityPropertiesThrombus Aging +---------+---------------+---------+-----------+----------+--------------+ CFV      Full           Yes      Yes                                 +---------+---------------+---------+-----------+----------+--------------+ SFJ      Full                                                        +---------+---------------+---------+-----------+----------+--------------+ FV Prox  Full           Yes      Yes                                 +---------+---------------+---------+-----------+----------+--------------+ FV Mid   Full  Yes      Yes                                 +---------+---------------+---------+-----------+----------+--------------+ FV DistalFull           Yes      Yes                                 +---------+---------------+---------+-----------+----------+--------------+ PFV      Full                                                        +---------+---------------+---------+-----------+----------+--------------+ POP      Full           Yes      Yes                                 +---------+---------------+---------+-----------+----------+--------------+ PTV      Full                                                         +---------+---------------+---------+-----------+----------+--------------+ PERO     Full                                                        +---------+---------------+---------+-----------+----------+--------------+     Summary: BILATERAL: - No evidence of deep vein thrombosis seen in the lower extremities, bilaterally. - No evidence of superficial venous thrombosis in the lower extremities, bilaterally. -No evidence of popliteal cyst, bilaterally.   *See table(s) above for measurements and observations. Electronically signed by Sherald Hess MD on 11/16/2020 at 12:40:12 PM.    Final     Patient Profile     45 y.o. male with past medical history of chronic systolic congestive heart failure, hypertension, obstructive sleep apnea, diabetes mellitus for evaluation of acute on chronic systolic congestive heart failure.  Echocardiogram shows ejection fraction 20 to 25%, akinesis of the septum and inferior wall possibly consistent with left bundle branch block, sarcoid should be considered, moderate left ventricular enlargement, moderate left ventricular hypertrophy, mild RV enlargement and RV dysfunction.  CTA showed no pulmonary embolus; there was note of adenopathy and cardiac sarcoid should be considered.  Assessment & Plan    1 acute on chronic systolic congestive heart failure-symptoms are improving.  We will continue diuresis with Lasix 40 mg IV twice daily and add spironolactone 12.5 mg daily.  Follow renal function and potassium closely.  2 cardiomyopathy-etiology unclear.  We will discontinue losartan and instead treat with Entresto 24/26 twice daily.  We will advance carvedilol later as acute systolic congestive heart failure improves.  Will not add digoxin in the setting of conduction abnormalities.  Patient will need right and left cardiac catheterization on Tuesday to exclude coronary disease.  He will also need a cardiac  MRI as there is concern for cardiac sarcoid (patient has  adenopathy noted on CT scan and conduction abnormalities on his electrocardiogram).  3 hypertension-patient's blood pressure is elevated.  I am changing losartan to Entresto 24/26 twice daily.  We will follow blood pressure and advance medications as needed.  4 morbid obesity-needs weight loss.  5 question pneumonia-antibiotics per primary care.  For questions or updates, please contact [facility] Please consult [email or web address] for contact info under        Signed, Olga Millers, MD  11/17/2020, 6:52 AM

## 2020-11-18 ENCOUNTER — Inpatient Hospital Stay (HOSPITAL_COMMUNITY): Payer: Medicaid Other

## 2020-11-18 LAB — GLUCOSE, CAPILLARY
Glucose-Capillary: 114 mg/dL — ABNORMAL HIGH (ref 70–99)
Glucose-Capillary: 150 mg/dL — ABNORMAL HIGH (ref 70–99)
Glucose-Capillary: 222 mg/dL — ABNORMAL HIGH (ref 70–99)
Glucose-Capillary: 243 mg/dL — ABNORMAL HIGH (ref 70–99)
Glucose-Capillary: 275 mg/dL — ABNORMAL HIGH (ref 70–99)

## 2020-11-18 LAB — BLOOD GAS, ARTERIAL
Acid-Base Excess: 7.9 mmol/L — ABNORMAL HIGH (ref 0.0–2.0)
Bicarbonate: 32.8 mmol/L — ABNORMAL HIGH (ref 20.0–28.0)
Drawn by: 34719
FIO2: 32
O2 Saturation: 93.7 %
Patient temperature: 36.5
pCO2 arterial: 52.6 mmHg — ABNORMAL HIGH (ref 32.0–48.0)
pH, Arterial: 7.408 (ref 7.350–7.450)
pO2, Arterial: 66.7 mmHg — ABNORMAL LOW (ref 83.0–108.0)

## 2020-11-18 LAB — COMPREHENSIVE METABOLIC PANEL WITH GFR
ALT: 28 U/L (ref 0–44)
AST: 16 U/L (ref 15–41)
Albumin: 3.3 g/dL — ABNORMAL LOW (ref 3.5–5.0)
Alkaline Phosphatase: 94 U/L (ref 38–126)
Anion gap: 6 (ref 5–15)
BUN: 15 mg/dL (ref 6–20)
CO2: 36 mmol/L — ABNORMAL HIGH (ref 22–32)
Calcium: 9 mg/dL (ref 8.9–10.3)
Chloride: 95 mmol/L — ABNORMAL LOW (ref 98–111)
Creatinine, Ser: 0.77 mg/dL (ref 0.61–1.24)
GFR, Estimated: >60 mL/min (ref >60)
Glucose, Bld: 124 mg/dL — ABNORMAL HIGH (ref 70–99)
Potassium: 3.9 mmol/L (ref 3.5–5.1)
Sodium: 137 mmol/L (ref 135–145)
Total Bilirubin: 0.9 mg/dL (ref 0.3–1.2)
Total Protein: 6.7 g/dL (ref 6.5–8.1)

## 2020-11-18 LAB — CBC WITH DIFFERENTIAL/PLATELET
Abs Immature Granulocytes: 0.02 K/uL (ref 0.00–0.07)
Basophils Absolute: 0.1 K/uL (ref 0.0–0.1)
Basophils Relative: 1 %
Eosinophils Absolute: 0.4 K/uL (ref 0.0–0.5)
Eosinophils Relative: 6 %
HCT: 54.6 % — ABNORMAL HIGH (ref 39.0–52.0)
Hemoglobin: 17.6 g/dL — ABNORMAL HIGH (ref 13.0–17.0)
Immature Granulocytes: 0 %
Lymphocytes Relative: 26 %
Lymphs Abs: 2 K/uL (ref 0.7–4.0)
MCH: 27.9 pg (ref 26.0–34.0)
MCHC: 32.2 g/dL (ref 30.0–36.0)
MCV: 86.5 fL (ref 80.0–100.0)
Monocytes Absolute: 0.6 K/uL (ref 0.1–1.0)
Monocytes Relative: 8 %
Neutro Abs: 4.4 K/uL (ref 1.7–7.7)
Neutrophils Relative %: 59 %
Platelets: 260 K/uL (ref 150–400)
RBC: 6.31 MIL/uL — ABNORMAL HIGH (ref 4.22–5.81)
RDW: 13.5 % (ref 11.5–15.5)
WBC: 7.5 K/uL (ref 4.0–10.5)
nRBC: 0 % (ref 0.0–0.2)

## 2020-11-18 LAB — CULTURE, RESPIRATORY W GRAM STAIN: Culture: NORMAL

## 2020-11-18 LAB — LEGIONELLA PNEUMOPHILA SEROGP 1 UR AG: L. pneumophila Serogp 1 Ur Ag: NEGATIVE

## 2020-11-18 MED ORDER — ENOXAPARIN SODIUM 100 MG/ML IJ SOSY
85.0000 mg | PREFILLED_SYRINGE | INTRAMUSCULAR | Status: DC
  Administered 2020-11-19: 85 mg via SUBCUTANEOUS
  Filled 2020-11-18: qty 1

## 2020-11-18 MED ORDER — POTASSIUM CHLORIDE CRYS ER 20 MEQ PO TBCR
40.0000 meq | EXTENDED_RELEASE_TABLET | Freq: Once | ORAL | Status: AC
  Administered 2020-11-18: 40 meq via ORAL
  Filled 2020-11-18: qty 2

## 2020-11-18 MED ORDER — ACETAZOLAMIDE 250 MG PO TABS
250.0000 mg | ORAL_TABLET | Freq: Two times a day (BID) | ORAL | Status: DC
  Administered 2020-11-18 – 2020-11-20 (×4): 250 mg via ORAL
  Filled 2020-11-18 (×4): qty 1

## 2020-11-18 NOTE — Plan of Care (Signed)

## 2020-11-18 NOTE — Progress Notes (Signed)
Patient with increased fatigue and having difficulty staying awake. Education provided on the importance of wearing his cpap while napping and at night to help with apnea symptoms. Patient expressing that his nose is too congested to tolerate. RT at bedside to assist with a full facemask and adjust cpap to bipap settings to help with comfort. MD made aware and at bedside to check on patient. Patient given instructions that he needs to keep the facemask on for the next hour before he can eat his dinner. Patient expressed understanding. Will continue to check on patient.

## 2020-11-18 NOTE — Progress Notes (Signed)
RT obtained ABG on pt with the following results. RT will continue to monitor.   Results for ARSH, FEUTZ (MRN [id number]) as of 11/18/2020 23:27  Ref. Range 11/18/2020 22:39  Sample type Unknown ARTERIAL  FIO2 Unknown 32.00  pH, Arterial Latest Ref Range: 7.350 - 7.450  7.408  pCO2 arterial Latest Ref Range: 32.0 - 48.0 mmHg 52.6 (H)  pO2, Arterial Latest Ref Range: 83.0 - 108.0 mmHg 66.7 (L)  Acid-Base Excess Latest Ref Range: 0.0 - 2.0 mmol/L 7.9 (H)  Bicarbonate Latest Ref Range: 20.0 - 28.0 mmol/L 32.8 (H)  O2 Saturation Latest Units: % 93.7  Patient temperature Unknown 36.5  Collection site Unknown RIGHT RADIAL  Allens test (pass/fail) Latest Ref Range: PASS  PASS

## 2020-11-18 NOTE — Progress Notes (Signed)
RT placed pt on BIPAP through dream station on 10/5 w/3 Lpm bled into the system. Pt respiratory status is stable w/some dyspnea on exertion. Pt currently sitting on side of bed appears to be lethargic but is responsive when asked questions. RT will continue to monitor.

## 2020-11-18 NOTE — Progress Notes (Signed)
PROGRESS NOTE   Marc Wright  YJE:[id number] DOB: 09/09/1975 DOA: 11/15/2020 PCP: Patient, No Pcp Per (Inactive)  Brief Narrative:  45 white male BMI 49 HFrEF 25% OSA noncompliant on CPAP DM TY 2 Current tobacco abuse half pack per day Occasional THC remote cocaine nothing recently Patient reportedly noncompliant on meds/CPAP  Present [facility] sore throat sputum difficulty breathing-at home self treated with several dose of Lasix Baseline does have orthopnea but has worsened over past several weeks  Rx in ED Lasix given BNP was 500 lactic acid normal CXR = right hilar opacity CT showing multifocal consolidation  Cardiology and pulmonology consulted  Hospital-Problem based course  Acute decompensated HFrEF EF 25% Current meds: Lasix 40 twice daily Coreg 3.125  Aldactone 25, Entresto- cardiology adjusting Net I/O: = -6.7 L, weight 180-->168 Starting Diamox 250 twice daily ?  Cardiac cath later this week CAP Pneumonia Continue doxycycline only-stop ceftriaxone 5/28 Not convinced this is really a pneumonia but will finish 5-day course of doxycycline-habitus makes imaging difficult to interpret Strep pneumo is negative, Legionella in process still Sarcoid granulomatous disease Echo 5/27 = infiltrative pattern with akinesis EF 20-25% Follow ACE level ANA done on 5/27 ESR and CRP elevated Defer to further specialists regarding MRI of heart to denote sarcoid Hyperglycemia without diagnosis of diabetes mellitus Follow A1c Sliding scale for now-metformin 500 twice daily on discharge for weight loss benefits glycemic control Smoker Stop steroids-not wheezing-not severely immunocompromised BMI 46 Life-threatening, patient only 45 years old-meets 2 of the 3 Medicare criteria for possible GOP-outpatient bariatric referral?   DVT prophylaxis: Lovenox Code Status: Full Family Communication: No family present today Disposition:  Status is: Inpatient  Remains inpatient appropriate  because:Persistent severe electrolyte disturbances, Unsafe d/c plan and IV treatments appropriate due to intensity of illness or inability to take PO   Dispo: The patient is from: Home              Anticipated d/c is to: Home              Patient currently is not medically stable to d/c.   Difficult to place patient No       Consultants:   Cardiology  Pulmonology  Procedures: Multiple  Antimicrobials: As above   Subjective:  Sleepy today but otherwise well No chest pain Urinating well Episode yesterday of shortness of breath diaphoresis with getting up has not recurred Troponin at the time was negative  Objective: Vitals:   11/17/20 2336 11/18/20 0348 11/18/20 0349 11/18/20 0835  BP: 118/70 118/77  (!) 137/94  Pulse: 95 86  92  Resp: _0 Temp: 97.7 F (36.5 C) (!) 97.3 F (36.3 C)  97.9 F (36.6 C)  TempSrc: Oral Oral  Oral  SpO2: 96% 97%  99%  Weight:   (!) 168.4 kg   Height:        Intake/Output Summary (Last 24 hours) at 11/18/2020 1335 Last data filed at 11/18/2020 1244 Gross per 24 hour  Intake 920 ml  Output 5070 ml  Net -4150 ml   Filed Weights   11/16/20 0015 11/17/20 0007 11/18/20 0349  Weight: (!) 180.5 kg (!) 171.1 kg (!) 168.4 kg    Examination:  No real change to exam   thick neck Mallampati 4 no icterus no pallor Multiple tattoos Decreased air entry-decreased rales S1-S2 tachycardic heart rate 90s Edema seems improved Euthymic congruent Power 5/5  Data Reviewed: personally reviewed   CBC    Component Value  Date/Time   WBC 7.5 11/18/2020 0625   RBC 6.31 (H) 11/18/2020 0625   HGB 17.6 (H) 11/18/2020 0625   HGB 15.6 11/24/2018 1204   HCT 54.6 (H) 11/18/2020 0625   HCT 48.9 11/24/2018 1204   PLT 260 11/18/2020 0625   PLT 562 (H) 11/24/2018 1204   MCV 86.5 11/18/2020 0625   MCV 88 11/24/2018 1204   MCH 27.9 11/18/2020 0625   MCHC 32.2 11/18/2020 0625   RDW 13.5 11/18/2020 0625   RDW 13.9 11/24/2018 1204    LYMPHSABS 2.0 11/18/2020 0625   LYMPHSABS 2.0 11/24/2018 1204   MONOABS 0.6 11/18/2020 0625   EOSABS 0.4 11/18/2020 0625   EOSABS 0.4 11/24/2018 1204   BASOSABS 0.1 11/18/2020 0625   BASOSABS 0.1 11/24/2018 1204   CMP Latest Ref Rng & Units 11/18/2020 11/17/2020 11/16/2020  Glucose 70 - 99 mg/dL 124(H) 156(H) 411(H)  BUN 6 - 20 mg/dL _0 Creatinine 0.61 - 1.24 mg/dL 0.77 0.85 0.72  Sodium 135 - 145 mmol/L 137 138 134(L)  Potassium 3.5 - 5.1 mmol/L 3.9 3.8 4.7  Chloride 98 - 111 mmol/L 95(L) 95(L) 100  CO2 22 - 32 mmol/L 36(H) 34(H) 27  Calcium 8.9 - 10.3 mg/dL 9.0 9.1 8.8(L)  Total Protein 6.5 - 8.1 g/dL 6.7 6.2(L) 6.7  Total Bilirubin 0.3 - 1.2 mg/dL 0.9 0.7 1.1  Alkaline Phos 38 - 126 U/L 94 99 126  AST 15 - 41 U/L _1 ALT 0 - 44 U/L 28 29 43     Radiology Studies: DG Chest 1 View  Result Date: 11/17/2020 CLINICAL DATA:  Chest pain and shortness of breath EXAM: CHEST  1 VIEW COMPARISON:  11/15/2020 FINDINGS: Cardiomegaly is again identified and stable. Increased vascular congestion is noted with slight increase in pulmonary edema when compare with the prior exam. Persistent medial airspace density is noted in the right base. No bony abnormality is noted. IMPRESSION: Persistent right basilar infiltrate with increasing pulmonary edema and vascular congestion. Electronically Signed   By: Inez Catalina M.D.   On: 11/17/2020 16:28   DG Chest 2 View  Result Date: 11/18/2020 CLINICAL DATA:  45 year old male with history of pneumonia. EXAM: CHEST - 2 VIEW COMPARISON:  11/17/2020 FINDINGS: The mediastinal contours are within normal limits. Unchanged moderate cardiomegaly. Similar appearing multifocal, patchy bilateral pulmonary opacities, most prominent in the right lower peribronchovascular region. No pleural effusions or pneumothorax. No acute osseous abnormality. IMPRESSION: Similar appearing patchy bilateral pulmonary opacities and unchanged chronic right lower  peribronchovascular opacification. Unchanged cardiomegaly. Electronically Signed   By: Ruthann Cancer MD   On: 11/18/2020 09:03     Scheduled Meds: . carvedilol  3.125 mg Oral BID WC  . [START ON 11/19/2020] enoxaparin (LOVENOX) injection  85 mg Subcutaneous Q24H  . furosemide  40 mg Intravenous BID  . insulin aspart  0-15 Units Subcutaneous Q4H  . nicotine  21 mg Transdermal Daily  . sacubitril-valsartan  1 tablet Oral BID  . sodium chloride flush  3 mL Intravenous Q12H  . spironolactone  25 mg Oral Daily   Continuous Infusions: . sodium chloride       LOS: 3 days   Time spent: 17 minutes  Nita Sells, MD [facility] To contact the attending provider between 7A-7P or the covering provider during after hours 7P-7A, please log into the web site [email or web address] and access using universal  password for that web site. If you do not have the password, please call  the hospital operator.  11/18/2020, 1:35 PM

## 2020-11-18 NOTE — Progress Notes (Signed)
Progress Note  Patient Name: Marc Wright Date of Encounter: 11/18/2020  [facility] Cardiologist: Dorothyann Peng  Subjective   No further CP; dyspnea improving.  Inpatient Medications    Scheduled Meds: . carvedilol  3.125 mg Oral BID WC  . doxycycline  100 mg Oral Q12H  . enoxaparin (LOVENOX) injection  90 mg Subcutaneous Q24H  . furosemide  40 mg Intravenous BID  . insulin aspart  0-15 Units Subcutaneous Q4H  . nicotine  21 mg Transdermal Daily  . sacubitril-valsartan  1 tablet Oral BID  . sodium chloride flush  3 mL Intravenous Q12H  . spironolactone  25 mg Oral Daily   Continuous Infusions: . sodium chloride     PRN Meds: sodium chloride, acetaminophen **OR** acetaminophen, HYDROcodone-acetaminophen, nitroGLYCERIN, sodium chloride flush   Vital Signs    Vitals:   11/17/20 2336 11/18/20 0348 11/18/20 0349 11/18/20 0835  BP: 118/70 118/77  (!) 137/94  Pulse: 95 86  92  Resp: 18 16  18   Temp: 97.7 F (36.5 C) (!) 97.3 F (36.3 C)  97.9 F (36.6 C)  TempSrc: Oral Oral  Oral  SpO2: 96% 97%  99%  Weight:   (!) 168.4 kg   Height:        Intake/Output Summary (Last 24 hours) at 11/18/2020 0848 Last data filed at 11/18/2020 1610 Gross per 24 hour  Intake 1010 ml  Output 5150 ml  Net -4140 ml   Last 3 Weights 11/18/2020 11/17/2020 11/16/2020  Weight (lbs) 371 lb 4.8 oz 377 lb 4.8 oz 397 lb 14.9 oz  Weight (kg) 168.421 kg 171.142 kg 180.5 kg      Telemetry    Sinus with first degree AV block - Personally Reviewed  Physical Exam   GEN: Obese NAD Neck: supple Cardiac: RRR, no rub Respiratory: CTA GI: Soft, NT/ND  MS: No edema Neuro:  Grossly intact Psych: Normal affect   Labs    High Sensitivity Troponin:   Recent Labs  Lab 11/15/20 1756 11/16/20 0009 11/16/20 0421 11/17/20 1615 11/17/20 1736  TROPONINIHS 30* 37* 25*  23* 24* 29*      Chemistry Recent Labs  Lab 11/16/20 0421 11/17/20 0155 11/18/20 0625  NA 134* 138 137  K 4.7  3.8 3.9  CL 100 95* 95*  CO2 27 34* 36*  GLUCOSE 411* 156* 124*  BUN 15 17 15   CREATININE 0.72 0.85 0.77  CALCIUM 8.8* 9.1 9.0  PROT 6.7 6.2* 6.7  ALBUMIN 3.4* 3.1* 3.3*  AST 21 17 16   ALT 43 29 28  ALKPHOS 126 99 94  BILITOT 1.1 0.7 0.9  GFRNONAA >60 >60 >60  ANIONGAP 7 9 6      Hematology Recent Labs  Lab 11/15/20 1556 11/16/20 0421 11/18/20 0625  WBC 7.9 9.6 7.5  RBC 5.92* 6.05* 6.31*  HGB 16.5 17.1* 17.6*  HCT 50.9 51.7 54.6*  MCV 86.0 85.5 86.5  MCH 27.9 28.3 27.9  MCHC 32.4 33.1 32.2  RDW 13.7 13.6 13.5  PLT 229 226 260    BNP Recent Labs  Lab 11/15/20 1556  BNP 598.1*     Radiology    DG Chest 1 View  Result Date: 11/17/2020 CLINICAL DATA:  Chest pain and shortness of breath EXAM: CHEST  1 VIEW COMPARISON:  11/15/2020 FINDINGS: Cardiomegaly is again identified and stable. Increased vascular congestion is noted with slight increase in pulmonary edema when compare with the prior exam. Persistent medial airspace density is noted in the right base. No  bony abnormality is noted. IMPRESSION: Persistent right basilar infiltrate with increasing pulmonary edema and vascular congestion. Electronically Signed   By: Alcide Clever M.D.   On: 11/17/2020 16:28   ECHOCARDIOGRAM COMPLETE  Result Date: 11/16/2020    ECHOCARDIOGRAM REPORT   Patient Name:   Marc Wright Date of Exam: 11/16/2020 Medical Rec #:  [id number]     Height:       75.0 in Accession #:    [id number]    Weight:       397.9 lb Date of Birth:  1975/07/02     BSA:          2.940 m Patient Age:    45 years      BP:           131/98 mmHg Patient Gender: M             HR:           100 bpm. Exam Location:  Inpatient Procedure: 2D Echo, Cardiac Doppler and Color Doppler Indications:    Cardiac Sarcoidosis D86.85                 CHF-Acute Systolic I50.21  History:        Patient has prior history of Echocardiogram examinations, most                 recent 06/27/2016. CHF; Risk Factors:Diabetes.  Sonographer:    Eulah Pont RDCS Referring Phys: Consepcion Hearing ANASTASSIA DOUTOVA IMPRESSIONS  1. Akinesis noted of the basal to mid septum and basal to mid inferior wall. There is also septal movement consistent with LBBB. Unusual pattern of akinesis for coronary distribution noted. Would consider infiltrative cardiomyopathy such as sarcoidosis and would recommend a cardiac MRI for clarification. Left ventricular ejection fraction, by estimation, is 20 to 25%. The left ventricle has severely decreased function. The left ventricle demonstrates regional wall motion abnormalities (see scoring diagram/findings for description). The left ventricular internal cavity size was moderately dilated. There is moderate concentric left ventricular hypertrophy. Indeterminate diastolic filling due to E-A fusion.  2. Right ventricular systolic function is mildly reduced. The right ventricular size is mildly enlarged. There is mildly elevated pulmonary artery systolic pressure. The estimated right ventricular systolic pressure is 42.9 mmHg.  3. The mitral valve is grossly normal. Trivial mitral valve regurgitation. No evidence of mitral stenosis.  4. The aortic valve is tricuspid. Aortic valve regurgitation is not visualized. No aortic stenosis is present.  5. The inferior vena cava is dilated in size with <50% respiratory variability, suggesting right atrial pressure of 15 mmHg. Comparison(s): No significant change from prior study. EF remains similar ~20-25%. WMAs noted above. FINDINGS  Left Ventricle: Akinesis noted of the basal to mid septum and basal to mid inferior wall. There is also septal movement consistent with LBBB. Unusual pattern of akinesis for coronary distribution noted. Would consider infiltrative cardiomyopathy such as  sarcoidosis and would recommend a cardiac MRI for clarification. Left ventricular ejection fraction, by estimation, is 20 to 25%. The left ventricle has severely decreased function. The left ventricle demonstrates regional  wall motion abnormalities. The  left ventricular internal cavity size was moderately dilated. There is moderate concentric left ventricular hypertrophy. Abnormal (paradoxical) septal motion, consistent with left bundle branch block. Indeterminate diastolic filling due to E-A fusion.  LV Wall Scoring: The inferior wall, basal anteroseptal segment, mid inferoseptal segment, and basal inferoseptal segment are akinetic. Right Ventricle: The right ventricular size is mildly enlarged. No increase  in right ventricular wall thickness. Right ventricular systolic function is mildly reduced. There is mildly elevated pulmonary artery systolic pressure. The tricuspid regurgitant  velocity is 2.64 m/s, and with an assumed right atrial pressure of 15 mmHg, the estimated right ventricular systolic pressure is 42.9 mmHg. Left Atrium: Left atrial size was normal in size. Right Atrium: Right atrial size was normal in size. Pericardium: Trivial pericardial effusion is present. Mitral Valve: The mitral valve is grossly normal. Trivial mitral valve regurgitation. No evidence of mitral valve stenosis. Tricuspid Valve: The tricuspid valve is grossly normal. Tricuspid valve regurgitation is mild . No evidence of tricuspid stenosis. Aortic Valve: The aortic valve is tricuspid. Aortic valve regurgitation is not visualized. No aortic stenosis is present. Pulmonic Valve: The pulmonic valve was grossly normal. Pulmonic valve regurgitation is not visualized. No evidence of pulmonic stenosis. Aorta: The aortic root and ascending aorta are structurally normal, with no evidence of dilitation. Venous: The inferior vena cava is dilated in size with less than 50% respiratory variability, suggesting right atrial pressure of 15 mmHg. IAS/Shunts: The atrial septum is grossly normal. Additional Comments: A device lead is visualized in the right ventricle.  LEFT VENTRICLE PLAX 2D LVIDd:         6.50 cm LVIDs:         5.70 cm LV PW:         1.40 cm LV IVS:         1.40 cm LVOT diam:     2.30 cm LV SV:         63 LV SV Index:   21 LVOT Area:     4.15 cm  LV Volumes (MOD) LV vol d, MOD A2C: 292.0 ml LV vol d, MOD A4C: 234.0 ml LV vol s, MOD A2C: 225.0 ml LV vol s, MOD A4C: 153.0 ml LV SV MOD A2C:     67.0 ml LV SV MOD A4C:     234.0 ml LV SV MOD BP:      71.6 ml RIGHT VENTRICLE RV S prime:     8.33 cm/s TAPSE (M-mode): 1.9 cm LEFT ATRIUM             Index       RIGHT ATRIUM           Index LA diam:        5.10 cm 1.73 cm/m  RA Area:     28.60 cm LA Vol (A2C):   67.2 ml 22.86 ml/m RA Volume:   106.00 ml 36.06 ml/m LA Vol (A4C):   67.0 ml 22.79 ml/m LA Biplane Vol: 73.4 ml 24.97 ml/m  AORTIC VALVE LVOT Vmax:   100.90 cm/s LVOT Vmean:  68.633 cm/s LVOT VTI:    0.152 m  AORTA Ao Root diam: 3.58 cm Ao Asc diam:  3.70 cm TRICUSPID VALVE TR Peak grad:   27.9 mmHg TR Vmax:        264.00 cm/s  SHUNTS Systemic VTI:  0.15 m Systemic Diam: 2.30 cm Lennie Odor MD Electronically signed by Lennie Odor MD Signature Date/Time: 11/16/2020/1:57:34 PM    Final    VAS [place] LOWER EXTREMITY VENOUS (DVT)  Result Date: 11/16/2020  Lower Venous DVT Study Patient Name:  IZYAN EZZELL  Date of Exam:   11/16/2020 Medical Rec #: [id number]      Accession #:    [id number] Date of Birth: May 24, 1976      Patient Gender: M Patient Age:   49Y Exam Location:  [facility]  [facility] Procedure:      VAS US LOWER EXTREMITY VENOUS (DVT) Referring Phys: [id number] ANASTASSIA DOUTOVA --------------------------------------------------------------------------------  Indications: Edema.  Risk Factors: Chronic CHF - noncompliant. Comparison Study: Previous 12/19/18 Negative Performing Technologist: Ernestene MentionJody Hill  Examination Guidelines: A complete evaluation includes B-mode imaging, spectral Doppler, color Doppler, and power Doppler as needed of all accessible portions of each vessel. Bilateral testing is considered an integral part of a complete examination. Limited examinations for reoccurring indications may be  performed as noted. The reflux portion of the exam is performed with the patient in reverse Trendelenburg.  +---------+---------------+---------+-----------+----------+--------------+ RIGHT    CompressibilityPhasicitySpontaneityPropertiesThrombus Aging +---------+---------------+---------+-----------+----------+--------------+ CFV      Full           Yes      Yes                                 +---------+---------------+---------+-----------+----------+--------------+ SFJ      Full                                                        +---------+---------------+---------+-----------+----------+--------------+ FV Prox  Full           Yes      Yes                                 +---------+---------------+---------+-----------+----------+--------------+ FV Mid   Full           Yes      Yes                                 +---------+---------------+---------+-----------+----------+--------------+ FV DistalFull           Yes      Yes                                 +---------+---------------+---------+-----------+----------+--------------+ PFV      Full                                                        +---------+---------------+---------+-----------+----------+--------------+ POP      Full           Yes      Yes                                 +---------+---------------+---------+-----------+----------+--------------+ PTV      Full                                                        +---------+---------------+---------+-----------+----------+--------------+ PERO     Full                                                        +---------+---------------+---------+-----------+----------+--------------+   +---------+---------------+---------+-----------+----------+--------------+  LEFT     CompressibilityPhasicitySpontaneityPropertiesThrombus Aging +---------+---------------+---------+-----------+----------+--------------+ CFV      Full            Yes      Yes                                 +---------+---------------+---------+-----------+----------+--------------+ SFJ      Full                                                        +---------+---------------+---------+-----------+----------+--------------+ FV Prox  Full           Yes      Yes                                 +---------+---------------+---------+-----------+----------+--------------+ FV Mid   Full           Yes      Yes                                 +---------+---------------+---------+-----------+----------+--------------+ FV DistalFull           Yes      Yes                                 +---------+---------------+---------+-----------+----------+--------------+ PFV      Full                                                        +---------+---------------+---------+-----------+----------+--------------+ POP      Full           Yes      Yes                                 +---------+---------------+---------+-----------+----------+--------------+ PTV      Full                                                        +---------+---------------+---------+-----------+----------+--------------+ PERO     Full                                                        +---------+---------------+---------+-----------+----------+--------------+     Summary: BILATERAL: - No evidence of deep vein thrombosis seen in the lower extremities, bilaterally. - No evidence of superficial venous thrombosis in the lower extremities, bilaterally. -No evidence of popliteal cyst, bilaterally.   *See table(s) above for measurements and observations. Electronically signed by Sherald Hess MD on 11/16/2020 at 12:40:12 PM.    Final  Patient Profile     45 y.o. male with past medical history of chronic systolic congestive heart failure, hypertension, obstructive sleep apnea, diabetes mellitus for evaluation of acute on chronic systolic  congestive heart failure.  Echocardiogram shows ejection fraction 20 to 25%, akinesis of the septum and inferior wall possibly consistent with left bundle branch block, sarcoid should be considered, moderate left ventricular enlargement, moderate left ventricular hypertrophy, mild RV enlargement and RV dysfunction.  CTA showed no pulmonary embolus; there was note of adenopathy and cardiac sarcoid should be considered.  Assessment & Plan    1 acute on chronic systolic congestive heart failure-symptoms are improving. [id number].  We will continue diuresis with Lasix 40 mg IV twice daily and spironolactone (change to 25 mg daily).  Follow renal function and potassium closely.  2 cardiomyopathy-etiology unclear.  Continue Entresto.  Patient also on carvedilol 3.125 mg twice daily.  Will not add digoxin in the setting of conduction abnormalities including long first-degree AV block.  Plan right and left cardiac catheterization on Tuesday to exclude coronary disease.  The risk and benefits including myocardial infarction, CVA and death discussed and he agrees to proceed. He also needs a cardiac MRI given concern for cardiac sarcoid (patient has adenopathy noted on CT scan and conduction abnormalities on his electrocardiogram).  His weight may preclude this.   3 hypertension-patient's blood pressure is improving.  Continue present medications and follow.  Increase Entresto as needed.  4 morbid obesity-needs weight loss.  5 question pneumonia-antibiotics per primary care.  6 chest pain-no recurrences.  Troponins with no clear trend.  For questions or updates, please contact [facility] Please consult [email or web address] for contact info under        Signed, Olga Millers, MD  11/18/2020, 8:48 AM

## 2020-11-18 NOTE — Plan of Care (Signed)
  Problem: Education: Goal: Knowledge of General Education information will improve Description: Including pain rating scale, medication(s)/side effects and non-pharmacologic comfort measures 11/18/2020 2331 by Nicole Cella, RN Outcome: Progressing 11/18/2020 2205 by Nicole Cella, RN Outcome: Progressing   Problem: Health Behavior/Discharge Planning: Goal: Ability to manage health-related needs will improve 11/18/2020 2331 by Nicole Cella, RN Outcome: Progressing 11/18/2020 2205 by Nicole Cella, RN Outcome: Progressing   Problem: Clinical Measurements: Goal: Ability to maintain clinical measurements within normal limits will improve Outcome: Progressing

## 2020-11-19 ENCOUNTER — Inpatient Hospital Stay (HOSPITAL_COMMUNITY): Payer: Medicaid Other

## 2020-11-19 LAB — COMPREHENSIVE METABOLIC PANEL
ALT: 27 U/L (ref 0–44)
AST: 17 U/L (ref 15–41)
Albumin: 3.2 g/dL — ABNORMAL LOW (ref 3.5–5.0)
Alkaline Phosphatase: 99 U/L (ref 38–126)
Anion gap: 7 (ref 5–15)
BUN: 17 mg/dL (ref 6–20)
CO2: 32 mmol/L (ref 22–32)
Calcium: 9.1 mg/dL (ref 8.9–10.3)
Chloride: 96 mmol/L — ABNORMAL LOW (ref 98–111)
Creatinine, Ser: 0.82 mg/dL (ref 0.61–1.24)
GFR, Estimated: >60 mL/min (ref >60)
Glucose, Bld: 191 mg/dL — ABNORMAL HIGH (ref 70–99)
Potassium: 3.5 mmol/L (ref 3.5–5.1)
Sodium: 135 mmol/L (ref 135–145)
Total Bilirubin: 0.8 mg/dL (ref 0.3–1.2)
Total Protein: 6.4 g/dL — ABNORMAL LOW (ref 6.5–8.1)

## 2020-11-19 LAB — CBC WITH DIFFERENTIAL/PLATELET
Abs Immature Granulocytes: 0.04 10*3/uL (ref 0.00–0.07)
Basophils Absolute: 0.1 10*3/uL (ref 0.0–0.1)
Basophils Relative: 1 %
Eosinophils Absolute: 0.5 10*3/uL (ref 0.0–0.5)
Eosinophils Relative: 7 %
HCT: 54.2 % — ABNORMAL HIGH (ref 39.0–52.0)
Hemoglobin: 17.2 g/dL — ABNORMAL HIGH (ref 13.0–17.0)
Immature Granulocytes: 1 %
Lymphocytes Relative: 26 %
Lymphs Abs: 1.6 10*3/uL (ref 0.7–4.0)
MCH: 27.6 pg (ref 26.0–34.0)
MCHC: 31.7 g/dL (ref 30.0–36.0)
MCV: 87 fL (ref 80.0–100.0)
Monocytes Absolute: 0.5 10*3/uL (ref 0.1–1.0)
Monocytes Relative: 8 %
Neutro Abs: 3.6 10*3/uL (ref 1.7–7.7)
Neutrophils Relative %: 57 %
Platelets: 256 10*3/uL (ref 150–400)
RBC: 6.23 MIL/uL — ABNORMAL HIGH (ref 4.22–5.81)
RDW: 13.9 % (ref 11.5–15.5)
WBC: 6.2 10*3/uL (ref 4.0–10.5)
nRBC: 0 % (ref 0.0–0.2)

## 2020-11-19 LAB — GLUCOSE, CAPILLARY
Glucose-Capillary: 146 mg/dL — ABNORMAL HIGH (ref 70–99)
Glucose-Capillary: 149 mg/dL — ABNORMAL HIGH (ref 70–99)
Glucose-Capillary: 170 mg/dL — ABNORMAL HIGH (ref 70–99)
Glucose-Capillary: 197 mg/dL — ABNORMAL HIGH (ref 70–99)
Glucose-Capillary: 200 mg/dL — ABNORMAL HIGH (ref 70–99)
Glucose-Capillary: 264 mg/dL — ABNORMAL HIGH (ref 70–99)

## 2020-11-19 MED ORDER — SODIUM CHLORIDE 0.9% FLUSH: 3.0000 mL | INTRAVENOUS | Status: DC | PRN

## 2020-11-19 MED ORDER — SODIUM CHLORIDE 0.9 % IV SOLN: INTRAVENOUS | Status: DC

## 2020-11-19 MED ORDER — SACUBITRIL-VALSARTAN 49-51 MG PO TABS
1.0000 | ORAL_TABLET | Freq: Two times a day (BID) | ORAL | Status: DC
  Administered 2020-11-19 – 2020-11-21 (×5): 1 via ORAL
  Filled 2020-11-19 (×6): qty 1

## 2020-11-19 MED ORDER — SODIUM CHLORIDE 0.9% FLUSH
3.0000 mL | Freq: Two times a day (BID) | INTRAVENOUS | Status: DC
  Administered 2020-11-20: 3 mL via INTRAVENOUS

## 2020-11-19 MED ORDER — SODIUM CHLORIDE 0.9 % IV SOLN: 250.0000 mL | INTRAVENOUS | Status: DC | PRN

## 2020-11-19 MED ORDER — ASPIRIN 81 MG PO CHEW
81.0000 mg | CHEWABLE_TABLET | ORAL | Status: AC
  Administered 2020-11-20: 81 mg via ORAL
  Filled 2020-11-19: qty 1

## 2020-11-19 NOTE — H&P (View-Only) (Signed)
Progress Note  Patient Name: KALIQ LEGE Date of Encounter: 11/19/2020  [facility] Cardiologist: Dorothyann Peng  Subjective   Pt denies CP or dyspnea  Inpatient Medications    Scheduled Meds: . acetaZOLAMIDE  250 mg Oral BID  . carvedilol  3.125 mg Oral BID WC  . enoxaparin (LOVENOX) injection  85 mg Subcutaneous Q24H  . furosemide  40 mg Intravenous BID  . insulin aspart  0-15 Units Subcutaneous Q4H  . nicotine  21 mg Transdermal Daily  . sacubitril-valsartan  1 tablet Oral BID  . sodium chloride flush  3 mL Intravenous Q12H  . spironolactone  25 mg Oral Daily   Continuous Infusions: . sodium chloride     PRN Meds: sodium chloride, acetaminophen **OR** acetaminophen, HYDROcodone-acetaminophen, nitroGLYCERIN, sodium chloride flush   Vital Signs    Vitals:   11/18/20 2000 11/18/20 2042 11/19/20 0037 11/19/20 0412  BP: (!) 127/95   (!) 117/106  Pulse: 86 89  83  Resp: 18 20  20   Temp: 97.7 F (36.5 C)   (!) 97.5 F (36.4 C)  TempSrc: Oral   Oral  SpO2: 92% 93%  95%  Weight:   (!) 165.7 kg   Height:        Intake/Output Summary (Last 24 hours) at 11/19/2020 0823 Last data filed at 11/19/2020 0411 Gross per 24 hour  Intake 880 ml  Output 3345 ml  Net -2465 ml   Last 3 Weights 11/19/2020 11/18/2020 11/17/2020  Weight (lbs) 365 lb 3.2 oz 371 lb 4.8 oz 377 lb 4.8 oz  Weight (kg) 165.654 kg 168.421 kg 171.142 kg      Telemetry    Sinus with first degree AV block - Personally Reviewed  Physical Exam   GEN: WD, obese NAD Neck: supple, JVP difficult to assess Cardiac: RRR Respiratory: CTA; no wheeze GI: Soft, NT/ND, obese MS: No edema Neuro:  No focal findings Psych: Normal affect   Labs    High Sensitivity Troponin:   Recent Labs  Lab 11/15/20 1756 11/16/20 0009 11/16/20 0421 11/17/20 1615 11/17/20 1736  TROPONINIHS 30* 37* 25*  23* 24* 29*      Chemistry Recent Labs  Lab 11/17/20 0155 11/18/20 0625 11/19/20 0224  NA 138 137 135   K 3.8 3.9 3.5  CL 95* 95* 96*  CO2 34* 36* 32  GLUCOSE 156* 124* 191*  BUN 17 15 17   CREATININE 0.85 0.77 0.82  CALCIUM 9.1 9.0 9.1  PROT 6.2* 6.7 6.4*  ALBUMIN 3.1* 3.3* 3.2*  AST 17 16 17   ALT 29 28 27   ALKPHOS 99 94 99  BILITOT 0.7 0.9 0.8  GFRNONAA >60 >60 >60  ANIONGAP 9 6 7      Hematology Recent Labs  Lab 11/16/20 0421 11/18/20 0625 11/19/20 0224  WBC 9.6 7.5 6.2  RBC 6.05* 6.31* 6.23*  HGB 17.1* 17.6* 17.2*  HCT 51.7 54.6* 54.2*  MCV 85.5 86.5 87.0  MCH 28.3 27.9 27.6  MCHC 33.1 32.2 31.7  RDW 13.6 13.5 13.9  PLT 226 260 256    BNP Recent Labs  Lab 11/15/20 1556  BNP 598.1*     Radiology    DG Chest 1 View  Result Date: 11/17/2020 CLINICAL DATA:  Chest pain and shortness of breath EXAM: CHEST  1 VIEW COMPARISON:  11/15/2020 FINDINGS: Cardiomegaly is again identified and stable. Increased vascular congestion is noted with slight increase in pulmonary edema when compare with the prior exam. Persistent medial airspace density is noted in  the right base. No bony abnormality is noted. IMPRESSION: Persistent right basilar infiltrate with increasing pulmonary edema and vascular congestion. Electronically Signed   By: Alcide Clever M.D.   On: 11/17/2020 16:28   DG Chest 2 View  Result Date: 11/18/2020 CLINICAL DATA:  45 year old male with history of pneumonia. EXAM: CHEST - 2 VIEW COMPARISON:  11/17/2020 FINDINGS: The mediastinal contours are within normal limits. Unchanged moderate cardiomegaly. Similar appearing multifocal, patchy bilateral pulmonary opacities, most prominent in the right lower peribronchovascular region. No pleural effusions or pneumothorax. No acute osseous abnormality. IMPRESSION: Similar appearing patchy bilateral pulmonary opacities and unchanged chronic right lower peribronchovascular opacification. Unchanged cardiomegaly. Electronically Signed   By: Marliss Coots MD   On: 11/18/2020 09:03    Patient Profile     45 y.o. male with past  medical history of chronic systolic congestive heart failure, hypertension, obstructive sleep apnea, diabetes mellitus for evaluation of acute on chronic systolic congestive heart failure.  Echocardiogram shows ejection fraction 20 to 25%, akinesis of the septum and inferior wall possibly consistent with left bundle branch block, sarcoid should be considered, moderate left ventricular enlargement, moderate left ventricular hypertrophy, mild RV enlargement and RV dysfunction.  CTA showed no pulmonary embolus; there was note of adenopathy and cardiac sarcoid should be considered.  Assessment & Plan    1 acute on chronic systolic congestive heart failure-symptoms are improving. I/O-2465. Wt 165.7 kg. Continue diuretics at present dose. Follow renal function; for left and right cath in AM.   2 cardiomyopathy-etiology unclear.  Continue Entresto (increase to 49/51 BID) and low dose coreg.  Will not add digoxin in the setting of conduction abnormalities including long first-degree AV block.  Plan right and left cardiac catheterization on Tuesday to exclude coronary disease.  The risk and benefits including myocardial infarction, CVA and death previously discussed and he agrees to proceed. He also needs a cardiac MRI given concern for cardiac sarcoid (patient has adenopathy noted on CT scan and conduction abnormalities on his electrocardiogram).  His weight may preclude this.   3 dyspnea-this is all presumed secondary to congestive heart failure.  However chest x-ray continues to show infiltrates despite diuresis.  Question contribution from sarcoid.  If filling pressures are low on cardiac catheterization may need pulmonary evaluation.  4 hypertension-patient's blood pressure remains elevated.  Increase Entresto to 49/51 twice daily and follow.  4 morbid obesity-needs weight loss.  5 question pneumonia-antibiotics per primary care.  For questions or updates, please contact [facility] Please consult  [email or web address] for contact info under        Signed, Olga Millers, MD  11/19/2020, 8:23 AM

## 2020-11-19 NOTE — Progress Notes (Signed)
 Progress Note  Patient Name: Marc Wright Date of Encounter: 11/19/2020  [facility] Cardiologist: Dwayne Callwood  Subjective   Pt denies CP or dyspnea  Inpatient Medications    Scheduled Meds: . acetaZOLAMIDE  250 mg Oral BID  . carvedilol  3.125 mg Oral BID WC  . enoxaparin (LOVENOX) injection  85 mg Subcutaneous Q24H  . furosemide  40 mg Intravenous BID  . insulin aspart  0-15 Units Subcutaneous Q4H  . nicotine  21 mg Transdermal Daily  . sacubitril-valsartan  1 tablet Oral BID  . sodium chloride flush  3 mL Intravenous Q12H  . spironolactone  25 mg Oral Daily   Continuous Infusions: . sodium chloride     PRN Meds: sodium chloride, acetaminophen **OR** acetaminophen, HYDROcodone-acetaminophen, nitroGLYCERIN, sodium chloride flush   Vital Signs    Vitals:   11/18/20 2000 11/18/20 2042 11/19/20 0037 11/19/20 0412  BP: (!) 127/95   (!) 117/106  Pulse: 86 89  83  Resp: 18 20  20  Temp: 97.7 F (36.5 C)   (!) 97.5 F (36.4 C)  TempSrc: Oral   Oral  SpO2: 92% 93%  95%  Weight:   (!) 165.7 kg   Height:        Intake/Output Summary (Last 24 hours) at 11/19/2020 0823 Last data filed at 11/19/2020 0411 Gross per 24 hour  Intake 880 ml  Output 3345 ml  Net -2465 ml   Last 3 Weights 11/19/2020 11/18/2020 11/17/2020  Weight (lbs) 365 lb 3.2 oz 371 lb 4.8 oz 377 lb 4.8 oz  Weight (kg) 165.654 kg 168.421 kg 171.142 kg      Telemetry    Sinus with first degree AV block - Personally Reviewed  Physical Exam   GEN: WD, obese NAD Neck: supple, JVP difficult to assess Cardiac: RRR Respiratory: CTA; no wheeze GI: Soft, NT/ND, obese MS: No edema Neuro:  No focal findings Psych: Normal affect   Labs    High Sensitivity Troponin:   Recent Labs  Lab 11/15/20 1756 11/16/20 0009 11/16/20 0421 11/17/20 1615 11/17/20 1736  TROPONINIHS 30* 37* 25*  23* 24* 29*      Chemistry Recent Labs  Lab 11/17/20 0155 11/18/20 0625 11/19/20 0224  NA 138 137 135   K 3.8 3.9 3.5  CL 95* 95* 96*  CO2 34* 36* 32  GLUCOSE 156* 124* 191*  BUN 17 15 17  CREATININE 0.85 0.77 0.82  CALCIUM 9.1 9.0 9.1  PROT 6.2* 6.7 6.4*  ALBUMIN 3.1* 3.3* 3.2*  AST 17 16 17  ALT 29 28 27  ALKPHOS 99 94 99  BILITOT 0.7 0.9 0.8  GFRNONAA >60 >60 >60  ANIONGAP 9 6 7     Hematology Recent Labs  Lab 11/16/20 0421 11/18/20 0625 11/19/20 0224  WBC 9.6 7.5 6.2  RBC 6.05* 6.31* 6.23*  HGB 17.1* 17.6* 17.2*  HCT 51.7 54.6* 54.2*  MCV 85.5 86.5 87.0  MCH 28.3 27.9 27.6  MCHC 33.1 32.2 31.7  RDW 13.6 13.5 13.9  PLT 226 260 256    BNP Recent Labs  Lab 11/15/20 1556  BNP 598.1*     Radiology    DG Chest 1 View  Result Date: 11/17/2020 CLINICAL DATA:  Chest pain and shortness of breath EXAM: CHEST  1 VIEW COMPARISON:  11/15/2020 FINDINGS: Cardiomegaly is again identified and stable. Increased vascular congestion is noted with slight increase in pulmonary edema when compare with the prior exam. Persistent medial airspace density is noted in   the right base. No bony abnormality is noted. IMPRESSION: Persistent right basilar infiltrate with increasing pulmonary edema and vascular congestion. Electronically Signed   By: Alcide Clever M.D.   On: 11/17/2020 16:28   DG Chest 2 View  Result Date: 11/18/2020 CLINICAL DATA:  45 year old male with history of pneumonia. EXAM: CHEST - 2 VIEW COMPARISON:  11/17/2020 FINDINGS: The mediastinal contours are within normal limits. Unchanged moderate cardiomegaly. Similar appearing multifocal, patchy bilateral pulmonary opacities, most prominent in the right lower peribronchovascular region. No pleural effusions or pneumothorax. No acute osseous abnormality. IMPRESSION: Similar appearing patchy bilateral pulmonary opacities and unchanged chronic right lower peribronchovascular opacification. Unchanged cardiomegaly. Electronically Signed   By: Marliss Coots MD   On: 11/18/2020 09:03    Patient Profile     45 y.o. male with past  medical history of chronic systolic congestive heart failure, hypertension, obstructive sleep apnea, diabetes mellitus for evaluation of acute on chronic systolic congestive heart failure.  Echocardiogram shows ejection fraction 20 to 25%, akinesis of the septum and inferior wall possibly consistent with left bundle branch block, sarcoid should be considered, moderate left ventricular enlargement, moderate left ventricular hypertrophy, mild RV enlargement and RV dysfunction.  CTA showed no pulmonary embolus; there was note of adenopathy and cardiac sarcoid should be considered.  Assessment & Plan    1 acute on chronic systolic congestive heart failure-symptoms are improving. I/O-2465. Wt 165.7 kg. Continue diuretics at present dose. Follow renal function; for left and right cath in AM.   2 cardiomyopathy-etiology unclear.  Continue Entresto (increase to 49/51 BID) and low dose coreg.  Will not add digoxin in the setting of conduction abnormalities including long first-degree AV block.  Plan right and left cardiac catheterization on Tuesday to exclude coronary disease.  The risk and benefits including myocardial infarction, CVA and death previously discussed and he agrees to proceed. He also needs a cardiac MRI given concern for cardiac sarcoid (patient has adenopathy noted on CT scan and conduction abnormalities on his electrocardiogram).  His weight may preclude this.   3 dyspnea-this is all presumed secondary to congestive heart failure.  However chest x-ray continues to show infiltrates despite diuresis.  Question contribution from sarcoid.  If filling pressures are low on cardiac catheterization may need pulmonary evaluation.  4 hypertension-patient's blood pressure remains elevated.  Increase Entresto to 49/51 twice daily and follow.  4 morbid obesity-needs weight loss.  5 question pneumonia-antibiotics per primary care.  For questions or updates, please contact [facility] Please consult  [email or web address] for contact info under        Signed, Olga Millers, MD  11/19/2020, 8:23 AM

## 2020-11-19 NOTE — Progress Notes (Signed)
Marc Wright  ZJQ:[id number] DOB: [birth year] DOA: 11/15/2020 PCP: Patient, No Pcp Per (Inactive)  Brief Narrative:  18 white male BMI 49 HFrEF 25% OSA noncompliant on CPAP DM TY 2 Current tobacco abuse half pack per day Occasional THC remote cocaine nothing recently Patient reportedly noncompliant on meds/CPAP  Present [facility] ED sore throat sputum difficulty breathing-at home self treated with several dose of Lasix Baseline does have orthopnea but has worsened over past several weeks  Rx in ED Lasix given BNP was 500 lactic acid normal CXR = right hilar opacity CT showing multifocal consolidation  Cardiology and pulmonology consulted   he was diuresed relatively aggressively looks like he might of become hypercarbic on 5/29 and mandatory BiPAP was enforced--at night and he is improved We currently are waiting on right and left heart cath to determine filling his planned disposition pressures and high-resolution CT to tease out whether he has a risk for sarcoid  Planned disposition is home  Hospital-Problem based course  Acute decompensated HFrEF EF 25% Current meds: Lasix 40 twice daily Coreg 3.125  Aldactone 25, Entresto- cardiology adjusting Net I/O: = -10.4 L, weight 180-->165 kilograms Starting Diamox 250 bid 5/29 ?  Cardiac cath later this week CAP Pneumonia Continue doxycycline only-stop ceftriaxone 5/28 Stopping doxycycline 5/30 after CT chest-see below Strep pneumo is negative, Legionella in process still Sarcoid granulomatous disease[?] Echo 5/27 = infiltrative pattern with akinesis EF 20-25% Follow ACE level 5/27 ESR and CRP elevated ANA is negative ?  MRI of heart to denote sarcoid Follow 5/30 pulmonary high-resolution CT ordered by Dr. Silas Flood Hyperglycemia without diagnosis of diabetes mellitus Follow A1c???? Sliding scale for now-metformin 500 twice daily on discharge for weight loss benefits glycemic control Smoker Stop steroids-not  wheezing-not severely immunocompromised BMI 46 Life-threatening, patient only 45 years old-meets 2 of the 3 Medicare criteria for possible GOP-outpatient bariatric referral?   DVT prophylaxis: Lovenox Code Status: Full Family Communication: No family present--I will update him and his brother in the morning once we know more about status of heart cath and chest CT Disposition:  Status is: Inpatient  Remains inpatient appropriate because:Persistent severe electrolyte disturbances, Unsafe d/c plan and IV treatments appropriate due to intensity of illness or inability to take PO   Dispo: The patient is from: Home              Anticipated d/c is to: Home              Patient currently is not medically stable to d/c.   Difficult to place patient No       Consultants:   Cardiology  Pulmonology  Procedures: Multiple  Antimicrobials: As above   Subjective:  Events from 5/29 noted was becoming probably hypercarbic He is much better Less SOB Got ~ 6 hours sleep [most he has had in a while] while on bipap  Objective: Vitals:   11/19/20 0905 11/19/20 0941 11/19/20 1101 11/19/20 1548  BP:  (!) 129/94    Pulse:  92    Resp:      Temp: 98.3 F (36.8 C)  98.3 F (36.8 C) (!) 97.5 F (36.4 C)  TempSrc: Oral  Oral Oral  SpO2:      Weight:      Height:        Intake/Output Summary (Last 24 hours) at 11/19/2020 1607 Last data filed at 11/19/2020 1539 Gross per 24 hour  Intake 917 ml  Output 4500 ml  Net -3583 ml  Filed Weights   11/17/20 0007 11/18/20 0349 11/19/20 0037  Weight: (!) 171.1 kg (!) 168.4 kg (!) 165.7 kg    Examination:   Well no distress thick neck no ralesrhonchi abd obese nt nd + edema LE but less than prior  Neuro intact no focal deficit Psych euthymic  Data Reviewed: personally reviewed   CBC    Component Value Date/Time   WBC 6.2 11/19/2020 0224   RBC 6.23 (H) 11/19/2020 0224   HGB 17.2 (H) 11/19/2020 0224   HGB 15.6 11/24/2018 1204    HCT 54.2 (H) 11/19/2020 0224   HCT 48.9 11/24/2018 1204   PLT 256 11/19/2020 0224   PLT 562 (H) 11/24/2018 1204   MCV 87.0 11/19/2020 0224   MCV 88 11/24/2018 1204   MCH 27.6 11/19/2020 0224   MCHC 31.7 11/19/2020 0224   RDW 13.9 11/19/2020 0224   RDW 13.9 11/24/2018 1204   LYMPHSABS 1.6 11/19/2020 0224   LYMPHSABS 2.0 11/24/2018 1204   MONOABS 0.5 11/19/2020 0224   EOSABS 0.5 11/19/2020 0224   EOSABS 0.4 11/24/2018 1204   BASOSABS 0.1 11/19/2020 0224   BASOSABS 0.1 11/24/2018 1204   CMP Latest Ref Rng & Units 11/19/2020 11/18/2020 11/17/2020  Glucose 70 - 99 mg/dL 191(H) 124(H) 156(H)  BUN 6 - 20 mg/dL _0 Creatinine 0.61 - 1.24 mg/dL 0.82 0.77 0.85  Sodium 135 - 145 mmol/L 135 137 138  Potassium 3.5 - 5.1 mmol/L 3.5 3.9 3.8  Chloride 98 - 111 mmol/L 96(L) 95(L) 95(L)  CO2 22 - 32 mmol/L 32 36(H) 34(H)  Calcium 8.9 - 10.3 mg/dL 9.1 9.0 9.1  Total Protein 6.5 - 8.1 g/dL 6.4(L) 6.7 6.2(L)  Total Bilirubin 0.3 - 1.2 mg/dL 0.8 0.9 0.7  Alkaline Phos 38 - 126 U/L 99 94 99  AST 15 - 41 U/L _1 ALT 0 - 44 U/L _2 Radiology Studies: DG Chest 1 View  Result Date: 11/17/2020 CLINICAL DATA:  Chest pain and shortness of breath EXAM: CHEST  1 VIEW COMPARISON:  11/15/2020 FINDINGS: Cardiomegaly is again identified and stable. Increased vascular congestion is noted with slight increase in pulmonary edema when compare with the prior exam. Persistent medial airspace density is noted in the right base. No bony abnormality is noted. IMPRESSION: Persistent right basilar infiltrate with increasing pulmonary edema and vascular congestion. Electronically Signed   By: Inez Catalina M.D.   On: 11/17/2020 16:28   DG Chest 2 View  Result Date: 11/18/2020 CLINICAL DATA:  45 year old male with history of pneumonia. EXAM: CHEST - 2 VIEW COMPARISON:  11/17/2020 FINDINGS: The mediastinal contours are within normal limits. Unchanged moderate cardiomegaly. Similar appearing multifocal,  patchy bilateral pulmonary opacities, most prominent in the right lower peribronchovascular region. No pleural effusions or pneumothorax. No acute osseous abnormality. IMPRESSION: Similar appearing patchy bilateral pulmonary opacities and unchanged chronic right lower peribronchovascular opacification. Unchanged cardiomegaly. Electronically Signed   By: Ruthann Cancer MD   On: 11/18/2020 09:03     Scheduled Meds: . acetaZOLAMIDE  250 mg Oral BID  . carvedilol  3.125 mg Oral BID WC  . enoxaparin (LOVENOX) injection  85 mg Subcutaneous Q24H  . furosemide  40 mg Intravenous BID  . insulin aspart  0-15 Units Subcutaneous Q4H  . nicotine  21 mg Transdermal Daily  . sacubitril-valsartan  1 tablet Oral BID  . sodium chloride flush  3 mL Intravenous Q12H  . spironolactone  25 mg Oral  Daily   Continuous Infusions: . sodium chloride       LOS: 4 days   Time spent: 17 minutes  Nita Sells, MD [facility] To contact the attending provider between 7A-7P or the covering provider during after hours 7P-7A, please log into the web site [email or web address] and access using universal [place] password for that web site. If you do not have the password, please call the hospital operator.  11/19/2020, 4:07 PM

## 2020-11-19 NOTE — Progress Notes (Addendum)
NAME:  Marc Wright, MRN:  [id number], DOB:  [birth year], LOS: 4 ADMISSION DATE:  11/15/2020, CONSULTATION DATE:  11/19/20 REFERRING MD:  TRH CHIEF COMPLAINT: Shortness of breath  History of Present Illness:  45 year old whom we are seeing in consultation for evaluation of abnormal chest imaging hypoxemia.  ED and H&P notes reviewed.  Patient is 1 week history of worsening dyspnea on exertion.  Associated with increased nasal congestion.  Developed shortness of breath at rest.  Shortness of breath worse when lying down, improves when sitting up.  Noted increasing lower extremity swelling more so on the left leg than the right over the same timeframe.  Denies respiratory issues with pollen earlier in the spring.  Denies any wheezing.  Worsened over time which prompted presentation to ED.  There BNP elevated.  Chest x-ray congested.  CT scan with bilateral) right greater than left pleural effusions with mild interlobular septal thickening.  Had several episodes of infiltrate most notably in bilateral lower lobes.  Reviewed CT scan 2020 monitor patient with similar appearing infiltrates although more prominent this admission.  No fever or chills.  Does have mild cough, sounds almost blood-tinged.  Reviewed labs extensively no leukocytosis, no fever here, procalcitonin negative x2.  Received Lasix.  He is feeling better.  Weaning oxygen.  Notes he desats when he sleeps but has known sleep apnea.  Is not surprising.  Pertinent  Medical History  Cardiomyopathy, asthma, seasonal allergies  Significant Hospital Events: Including procedures, antibiotic start and stop dates in addition to other pertinent events   . 5/25-10/2024 admitted to hospital, began diuresed, got dose of Solu-Medrol 60 mg IV x1 . 5/27 pulmonary consulted . 5/30 Most respiratory symptoms resolved or markedly improved,   Interim History / Subjective:  NAEON. Says DOE, difficulty catching breath have improved with diuresis.  Objective    Blood pressure (!) 129/94, pulse 92, temperature 98.3 F (36.8 C), temperature source Oral, resp. rate 20, height 6' 3"  (1.905 m), weight (!) 165.7 kg, SpO2 95 %.        Intake/Output Summary (Last 24 hours) at 11/19/2020 1445 Last data filed at 11/19/2020 1317 Gross per 24 hour  Intake 917 ml  Output 3900 ml  Net -2983 ml   Filed Weights   11/17/20 0007 11/18/20 0349 11/19/20 0037  Weight: (!) 171.1 kg (!) 168.4 kg (!) 165.7 kg    Examination: General: sitting in bed, no acute distress Eyes: EOMI, Neck: Supple, habitus precludes accurate JVP assessment Cardiovascular: Regular rhythm, no murmur, impressive doughy edema bilaterally has improved Pulmonary: No wheeze, distant, difficult to auscultate Abdomen: Nondistended, bowel sounds present MSK: No synovitis, joint effusion Neuro: No wounds, sensation intact Psych normal mood, full affect   Labs/imaging that I havepersonally reviewed    CXR, CT chest, CBC, chem, CRP, ESR, procalcitonin  Resolved Hospital Problem list   n/a  Assessment & Plan:  Persistent bilateral LL infiltrates: Subacute to acute onset of SOB in setting of mild volume overload. Similar albeit slightly more prominent infiltrates compared to 2020. Central location could be consistent with pulmonary edema. Rounded atelectasis considered. Possible organizing pneumonia. Procalcitionin negative, no fever, no luekocytosis - infection felt unlikely. Sarcoid possible but lower lobe and appearence of infiltrates is atypical. LAD likely reactive. However, sarcoid would potentially unite infiltrates and cardiomyopathy. Higher risk for bronchoscopy due to OSA. Would advocate for general anesthiesia with airway as high risk for decompensation in terms of moderate sedation due to OSA. --Continue aggressive diuresis as kidney function  and BP allows --CT high res prone/supine ordered for further evaluation, if infiltrates persist will plan for bronch while inpatient,  tentatively 6/1 --will follow results of LHC/RHC tomorrow - if filling pressures elevated would advocate holding on bronchoscopy until dry  DOE, Hypoxemia: Likely multifactorial. Desats when asleep - known OSA not surprising. Bicarb elevated indicative of likely OHS. Volume overload. BNP elevated. Endorses orthopnea. SOB/DOE improving with diuresis.  Current and h/o polycythemia. Hypoxemia likely chronic and related to OHS/OSA.  OSA: Diagnosed in past, did not tolerate CPAP. --Night time O2, can repeat PSG as outpatient   Best practice (right click and "Reselect all SmartList Selections" daily)  Per primary  Labs   CBC: Recent Labs  Lab 11/15/20 1556 11/16/20 0421 11/18/20 0625 11/19/20 0224  WBC 7.9 9.6 7.5 6.2  NEUTROABS 5.3 8.7* 4.4 3.6  HGB 16.5 17.1* 17.6* 17.2*  HCT 50.9 51.7 54.6* 54.2*  MCV 86.0 85.5 86.5 87.0  PLT 229 226 260 414    Basic Metabolic Panel: Recent Labs  Lab 11/16/20 0009 11/16/20 0421 11/17/20 0155 11/18/20 0625 11/19/20 0224  NA 137 134* 138 137 135  K 3.8 4.7 3.8 3.9 3.5  CL 97* 100 95* 95* 96*  CO2 30 27 34* 36* 32  GLUCOSE 242* 411* 156* 124* 191*  BUN 14 15 17 15 17   CREATININE 0.66 0.72 0.85 0.77 0.82  CALCIUM 8.8* 8.8* 9.1 9.0 9.1  MG 1.7 1.7 1.7  --   --   PHOS  --  3.7  --   --   --    GFR: Estimated Creatinine Clearance: 184.3 mL/min (by C-G formula based on SCr of 0.82 mg/dL). Recent Labs  Lab 11/15/20 1556 11/15/20 1632 11/16/20 0008 11/16/20 0009 11/16/20 0421 11/18/20 0625 11/19/20 0224  PROCALCITON  --   --   --  <0.10 <0.10  --   --   WBC 7.9  --   --   --  9.6 7.5 6.2  LATICACIDVEN  --  1.4 1.3  --  1.4  --   --     Liver Function Tests: Recent Labs  Lab 11/15/20 1556 11/16/20 0421 11/17/20 0155 11/18/20 0625 11/19/20 0224  AST 51* 21 17 16 17   ALT 44 43 29 28 27   ALKPHOS 128* 126 99 94 99  BILITOT 0.9 1.1 0.7 0.9 0.8  PROT 7.0 6.7 6.2* 6.7 6.4*  ALBUMIN 3.5 3.4* 3.1* 3.3* 3.2*   No results for  input(s): LIPASE, AMYLASE in the last 168 hours. No results for input(s): AMMONIA in the last 168 hours.  ABG    Component Value Date/Time   PHART 7.408 11/18/2020 2239   PCO2ART 52.6 (H) 11/18/2020 2239   PO2ART 66.7 (L) 11/18/2020 2239   HCO3 32.8 (H) 11/18/2020 2239   TCO2 31 03/06/2016 1324   ACIDBASEDEF 0.3 11/07/2018 0251   O2SAT 93.7 11/18/2020 2239     Coagulation Profile: No results for input(s): INR, PROTIME in the last 168 hours.  Cardiac Enzymes: Recent Labs  Lab 11/16/20 0009 11/16/20 0421  CKTOTAL 38* 38*    HbA1C: Hgb A1c MFr Bld  Date/Time Value Ref Range Status  11/07/2018 02:39 AM 13.3 (H) 4.8 - 5.6 % Final    Comment:    (NOTE) Pre diabetes:          5.7%-6.4% Diabetes:              >6.4% Glycemic control for   <7.0% adults with diabetes   06/25/2016 06:03 PM  7.3 (H) 4.8 - 5.6 % Final    Comment:    (NOTE)         Pre-diabetes: 5.7 - 6.4         Diabetes: >6.4         Glycemic control for adults with diabetes: <7.0     CBG: Recent Labs  Lab 11/18/20 2010 11/19/20 0032 11/19/20 0404 11/19/20 0742 11/19/20 1102  GLUCAP 275* 149* 197* 146* 264*    Review of Systems:   No chest pain with exertion. No wheezing. No neurologic changes. Comprehensive ROS otherwise negative.  Past Medical History:  He,  has a past medical history of Asthma, CHF (congestive heart failure) ([place]), Diabetes mellitus without complication ([place]), OSA on CPAP, and Pneumonia (06/2016).   Surgical History:   Past Surgical History:  Procedure Laterality Date  . I & D EXTREMITY    . SKIN GRAFT       Social History:   reports that he has been smoking cigarettes. He started smoking about 3 years ago. He has been smoking about 0.75 packs per day. He has never used smokeless tobacco. He reports current alcohol use. He reports that he does not use drugs.   Family History:  His family history includes Asthma in his father; Congestive Heart Failure in his mother;  Hypertension in his mother; Stroke in his father.   Allergies Allergies  Allergen Reactions  . Azithromycin Itching and Rash    Burning rash with intravenous dose     Home Medications  Prior to Admission medications   Medication Sig Start Date End Date Taking? Authorizing Provider  cetirizine (ZYRTEC) 10 MG tablet Take 10 mg by mouth daily.   Yes [provider]  ibuprofen (ADVIL) 200 MG tablet Take 200 mg by mouth every 6 (six) hours as needed for moderate pain.   Yes [provider]  carvedilol (COREG) 12.5 MG tablet Take 1 tablet (12.5 mg total) by mouth 2 (two) times daily with a meal. Patient not taking: No sig reported 02/21/19   Darylene Price A, FNP  fexofenadine (ALLEGRA) 180 MG tablet Take 1 tablet (180 mg total) by mouth daily. Taking as needed Patient not taking: No sig reported 11/11/18   Georgette Shell, MD  furosemide (LASIX) 80 MG tablet Take 1 tablet (80 mg total) by mouth 2 (two) times daily. Patient not taking: No sig reported 02/21/19   Darylene Price A, FNP  HYDROcodone-acetaminophen (NORCO/VICODIN) 5-325 MG tablet Take 2 tablets by mouth every 6 (six) hours as needed. Patient not taking: No sig reported 06/22/20   Ward, Cyril Mourning N, DO  lisinopril (ZESTRIL) 5 MG tablet Take 1 tablet (5 mg total) by mouth daily. Patient not taking: No sig reported 02/21/19   Darylene Price A, FNP  metFORMIN (GLUCOPHAGE) 500 MG tablet Take 1 tablet (500 mg total) by mouth 2 (two) times daily with a meal. Patient not taking: No sig reported 03/21/19   Darylene Price A, FNP  ondansetron (ZOFRAN ODT) 4 MG disintegrating tablet Take 1 tablet (4 mg total) by mouth every 8 (eight) hours as needed for nausea or vomiting. Patient not taking: No sig reported 06/22/20   Ward, Delice Bison, DO     Critical care time: n/a     Lanier Clam, MD

## 2020-11-19 NOTE — Significant Event (Signed)
Rapid Response Event Note   Reason for Call :  Asked to see pt as a 2nd set of eyes d/t lethargy  Initial Focused Assessment:  Pt lying in bed with eyes closed, in no distress. Pt requires repeated verbal stimulation to awaken. Once awake, he will follow commands and move all extremities with repeated prompting. Once prompting ends, pt goes back to sleep very easily. Lungs diminished t/o. Skin warm and dry.   T-97.7, HR-80, BP-118/94, RR-22, SpO2-94% on Bipap with 3L bled in   Interventions:  CBG-275 ABG-7.4/52.6/66.7/32.8  Plan of Care:  ABG results WNL. Continue to monitor pt. Call RRT if further assistance needed.    Event Summary:   MD Notified:  Call [phone] Arrival [phone] End [id number]  Terrilyn Saver, RN

## 2020-11-19 NOTE — Progress Notes (Signed)
Called Lab--machnes broken--no trasnfer across Epic A1C 11.5 ACE level 114.

## 2020-11-20 ENCOUNTER — Encounter (HOSPITAL_COMMUNITY): Payer: Self-pay | Admitting: Cardiology

## 2020-11-20 ENCOUNTER — Encounter (HOSPITAL_COMMUNITY)
Admission: EM | Disposition: A | Discharge status: Home / Self Care | Payer: Self-pay | Source: Home / Self Care | Attending: Family Medicine

## 2020-11-20 DIAGNOSIS — G4733 Obstructive sleep apnea (adult) (pediatric): Secondary | ICD-10-CM

## 2020-11-20 DIAGNOSIS — R918 Other nonspecific abnormal finding of lung field: Secondary | ICD-10-CM

## 2020-11-20 DIAGNOSIS — I5023 Acute on chronic systolic (congestive) heart failure: Secondary | ICD-10-CM

## 2020-11-20 DIAGNOSIS — R748 Abnormal levels of other serum enzymes: Secondary | ICD-10-CM

## 2020-11-20 DIAGNOSIS — R590 Localized enlarged lymph nodes: Secondary | ICD-10-CM

## 2020-11-20 HISTORY — PX: RIGHT/LEFT HEART CATH AND CORONARY ANGIOGRAPHY: CATH118266

## 2020-11-20 LAB — COMPREHENSIVE METABOLIC PANEL
ALT: 31 U/L (ref 0–44)
AST: 32 U/L (ref 15–41)
Albumin: 3.3 g/dL — ABNORMAL LOW (ref 3.5–5.0)
Alkaline Phosphatase: 95 U/L (ref 38–126)
Anion gap: 10 (ref 5–15)
BUN: 19 mg/dL (ref 6–20)
CO2: 25 mmol/L (ref 22–32)
Calcium: 9.2 mg/dL (ref 8.9–10.3)
Chloride: 99 mmol/L (ref 98–111)
Creatinine, Ser: 0.82 mg/dL (ref 0.61–1.24)
GFR, Estimated: >60 mL/min (ref >60)
Glucose, Bld: 200 mg/dL — ABNORMAL HIGH (ref 70–99)
Potassium: 4.6 mmol/L (ref 3.5–5.1)
Sodium: 134 mmol/L — ABNORMAL LOW (ref 135–145)
Total Bilirubin: 1 mg/dL (ref 0.3–1.2)
Total Protein: 6.6 g/dL (ref 6.5–8.1)

## 2020-11-20 LAB — POCT I-STAT EG7
Acid-base deficit: 1 mmol/L (ref 0.0–2.0)
Acid-base deficit: 2 mmol/L (ref 0.0–2.0)
Bicarbonate: 28.1 mmol/L — ABNORMAL HIGH (ref 20.0–28.0)
Bicarbonate: 29.2 mmol/L — ABNORMAL HIGH (ref 20.0–28.0)
Calcium, Ion: 1.19 mmol/L (ref 1.15–1.40)
Calcium, Ion: 1.33 mmol/L (ref 1.15–1.40)
HCT: 56 % — ABNORMAL HIGH (ref 39.0–52.0)
HCT: 57 % — ABNORMAL HIGH (ref 39.0–52.0)
Hemoglobin: 19 g/dL — ABNORMAL HIGH (ref 13.0–17.0)
Hemoglobin: 19.4 g/dL — ABNORMAL HIGH (ref 13.0–17.0)
O2 Saturation: 59 %
O2 Saturation: 60 %
Potassium: 3.7 mmol/L (ref 3.5–5.1)
Potassium: 4 mmol/L (ref 3.5–5.1)
Sodium: 140 mmol/L (ref 135–145)
Sodium: 142 mmol/L (ref 135–145)
TCO2: 30 mmol/L (ref 22–32)
TCO2: 31 mmol/L (ref 22–32)
pCO2, Ven: 65.1 mmHg — ABNORMAL HIGH (ref 44.0–60.0)
pCO2, Ven: 68.2 mmHg — ABNORMAL HIGH (ref 44.0–60.0)
pH, Ven: 7.24 — ABNORMAL LOW (ref 7.250–7.430)
pH, Ven: 7.244 — ABNORMAL LOW (ref 7.250–7.430)
pO2, Ven: 37 mmHg (ref 32.0–45.0)
pO2, Ven: 38 mmHg (ref 32.0–45.0)

## 2020-11-20 LAB — CBC WITH DIFFERENTIAL/PLATELET
Abs Immature Granulocytes: 0.04 10*3/uL (ref 0.00–0.07)
Basophils Absolute: 0.1 10*3/uL (ref 0.0–0.1)
Basophils Relative: 1 %
Eosinophils Absolute: 0.5 10*3/uL (ref 0.0–0.5)
Eosinophils Relative: 7 %
HCT: 55.9 % — ABNORMAL HIGH (ref 39.0–52.0)
Hemoglobin: 18.1 g/dL — ABNORMAL HIGH (ref 13.0–17.0)
Immature Granulocytes: 1 %
Lymphocytes Relative: 29 %
Lymphs Abs: 2.1 10*3/uL (ref 0.7–4.0)
MCH: 28 pg (ref 26.0–34.0)
MCHC: 32.4 g/dL (ref 30.0–36.0)
MCV: 86.4 fL (ref 80.0–100.0)
Monocytes Absolute: 0.7 10*3/uL (ref 0.1–1.0)
Monocytes Relative: 9 %
Neutro Abs: 3.9 10*3/uL (ref 1.7–7.7)
Neutrophils Relative %: 53 %
Platelets: 281 10*3/uL (ref 150–400)
RBC: 6.47 MIL/uL — ABNORMAL HIGH (ref 4.22–5.81)
RDW: 13.9 % (ref 11.5–15.5)
WBC: 7.3 10*3/uL (ref 4.0–10.5)
nRBC: 0 % (ref 0.0–0.2)

## 2020-11-20 LAB — GLUCOSE, CAPILLARY
Glucose-Capillary: 118 mg/dL — ABNORMAL HIGH (ref 70–99)
Glucose-Capillary: 149 mg/dL — ABNORMAL HIGH (ref 70–99)
Glucose-Capillary: 178 mg/dL — ABNORMAL HIGH (ref 70–99)
Glucose-Capillary: 189 mg/dL — ABNORMAL HIGH (ref 70–99)
Glucose-Capillary: 208 mg/dL — ABNORMAL HIGH (ref 70–99)
Glucose-Capillary: 226 mg/dL — ABNORMAL HIGH (ref 70–99)
Glucose-Capillary: 267 mg/dL — ABNORMAL HIGH (ref 70–99)

## 2020-11-20 LAB — MAGNESIUM: Magnesium: 2 mg/dL (ref 1.7–2.4)

## 2020-11-20 LAB — HEMOGLOBIN A1C
Hgb A1c MFr Bld: 11.5 % — ABNORMAL HIGH (ref 4.8–5.6)
Mean Plasma Glucose: 283 mg/dL

## 2020-11-20 SURGERY — RIGHT/LEFT HEART CATH AND CORONARY ANGIOGRAPHY
Anesthesia: LOCAL

## 2020-11-20 MED ORDER — ACETAMINOPHEN 325 MG PO TABS
650.0000 mg | ORAL_TABLET | ORAL | Status: DC | PRN
  Administered 2020-11-20 – 2020-11-21 (×2): 650 mg via ORAL
  Filled 2020-11-20 (×2): qty 2

## 2020-11-20 MED ORDER — LIDOCAINE HCL (PF) 1 % IJ SOLN
INTRAMUSCULAR | Status: AC
  Filled 2020-11-20: qty 30

## 2020-11-20 MED ORDER — SODIUM CHLORIDE 0.9 % IV SOLN: 250.0000 mL | INTRAVENOUS | Status: DC | PRN

## 2020-11-20 MED ORDER — HEPARIN (PORCINE) IN NACL 1000-0.9 UT/500ML-% IV SOLN
INTRAVENOUS | Status: AC
  Filled 2020-11-20: qty 500

## 2020-11-20 MED ORDER — LIDOCAINE HCL (PF) 1 % IJ SOLN
INTRAMUSCULAR | Status: DC | PRN
  Administered 2020-11-20 (×2): 2 mL

## 2020-11-20 MED ORDER — HEPARIN SODIUM (PORCINE) 1000 UNIT/ML IJ SOLN
INTRAMUSCULAR | Status: AC
  Filled 2020-11-20: qty 1

## 2020-11-20 MED ORDER — MIDAZOLAM HCL 2 MG/2ML IJ SOLN
INTRAMUSCULAR | Status: AC
  Filled 2020-11-20: qty 2

## 2020-11-20 MED ORDER — SODIUM CHLORIDE 0.9% FLUSH
3.0000 mL | Freq: Two times a day (BID) | INTRAVENOUS | Status: DC
  Administered 2020-11-20 – 2020-11-21 (×2): 3 mL via INTRAVENOUS

## 2020-11-20 MED ORDER — FENTANYL CITRATE (PF) 100 MCG/2ML IJ SOLN
INTRAMUSCULAR | Status: AC
  Filled 2020-11-20: qty 2

## 2020-11-20 MED ORDER — ASPIRIN EC 81 MG PO TBEC: 81.0000 mg | DELAYED_RELEASE_TABLET | Freq: Every day | ORAL | Status: DC

## 2020-11-20 MED ORDER — FENTANYL CITRATE (PF) 100 MCG/2ML IJ SOLN
INTRAMUSCULAR | Status: DC | PRN
  Administered 2020-11-20: 25 ug via INTRAVENOUS

## 2020-11-20 MED ORDER — SODIUM CHLORIDE 0.9 % IV SOLN: INTRAVENOUS | Status: AC

## 2020-11-20 MED ORDER — IOHEXOL 350 MG/ML SOLN
INTRAVENOUS | Status: DC | PRN
  Administered 2020-11-20: 65 mL

## 2020-11-20 MED ORDER — ENOXAPARIN SODIUM 80 MG/0.8ML IJ SOSY: 80.0000 mg | PREFILLED_SYRINGE | INTRAMUSCULAR | Status: DC

## 2020-11-20 MED ORDER — DIGOXIN 125 MCG PO TABS
0.1250 mg | ORAL_TABLET | Freq: Every day | ORAL | Status: DC
  Administered 2020-11-20 – 2020-11-21 (×2): 0.125 mg via ORAL
  Filled 2020-11-20 (×2): qty 1

## 2020-11-20 MED ORDER — MIDAZOLAM HCL 2 MG/2ML IJ SOLN
INTRAMUSCULAR | Status: DC | PRN
  Administered 2020-11-20: 1 mg via INTRAVENOUS

## 2020-11-20 MED ORDER — ROSUVASTATIN CALCIUM 20 MG PO TABS
20.0000 mg | ORAL_TABLET | Freq: Every day | ORAL | Status: DC
  Administered 2020-11-20 – 2020-11-21 (×2): 20 mg via ORAL
  Filled 2020-11-20 (×2): qty 1

## 2020-11-20 MED ORDER — HYDRALAZINE HCL 20 MG/ML IJ SOLN: 10.0000 mg | INTRAMUSCULAR | Status: AC | PRN

## 2020-11-20 MED ORDER — SODIUM CHLORIDE 0.9% FLUSH: 3.0000 mL | INTRAVENOUS | Status: DC | PRN

## 2020-11-20 MED ORDER — LABETALOL HCL 5 MG/ML IV SOLN: 10.0000 mg | INTRAVENOUS | Status: AC | PRN

## 2020-11-20 MED ORDER — VERAPAMIL HCL 2.5 MG/ML IV SOLN
INTRAVENOUS | Status: DC | PRN
  Administered 2020-11-20: 10 mL via INTRA_ARTERIAL

## 2020-11-20 MED ORDER — VERAPAMIL HCL 2.5 MG/ML IV SOLN
INTRAVENOUS | Status: AC
  Filled 2020-11-20: qty 2

## 2020-11-20 MED ORDER — ONDANSETRON HCL 4 MG/2ML IJ SOLN: 4.0000 mg | Freq: Four times a day (QID) | INTRAMUSCULAR | Status: DC | PRN

## 2020-11-20 MED ORDER — HEPARIN (PORCINE) IN NACL 1000-0.9 UT/500ML-% IV SOLN
INTRAVENOUS | Status: DC | PRN
  Administered 2020-11-20 (×2): 500 mL

## 2020-11-20 MED ORDER — HEPARIN SODIUM (PORCINE) 1000 UNIT/ML IJ SOLN
INTRAMUSCULAR | Status: DC | PRN
  Administered 2020-11-20: 7000 [IU] via INTRAVENOUS

## 2020-11-20 SURGICAL SUPPLY — 13 items
CATH 5FR JL3.5 JR4 ANG PIG MP (CATHETERS) ×1 IMPLANT
CATH SWAN GANZ 7F STRAIGHT (CATHETERS) ×2 IMPLANT
DEVICE RAD COMP TR BAND LRG (VASCULAR PRODUCTS) ×1 IMPLANT
GLIDESHEATH SLEND SS 6F .021 (SHEATH) ×2 IMPLANT
GLIDESHEATH SLENDER 7FR .021G (SHEATH) ×2 IMPLANT
GUIDEWIRE INQWIRE 1.5J.035X260 (WIRE) ×1 IMPLANT
INQWIRE 1.5J .035X260CM (WIRE) ×2
KIT HEART LEFT (KITS) ×2 IMPLANT
MAT PREVALON FULL STRYKER (MISCELLANEOUS) ×1 IMPLANT
PACK CARDIAC CATHETERIZATION (CUSTOM PROCEDURE TRAY) ×2 IMPLANT
TRANSDUCER W/STOPCOCK (MISCELLANEOUS) ×2 IMPLANT
TUBING CIL FLEX 10 FLL-RA (TUBING) ×2 IMPLANT
WIRE MICROINTRODUCER 60CM (WIRE) ×2 IMPLANT

## 2020-11-20 NOTE — TOC Initial Note (Signed)
Transition of Care (TOC) - Initial/Assessment Note  Heart Failure   Patient Details  Name: Marc Wright MRN: [id number] Date of Birth: [birth year]  Transition of Care [facility]) CM/SW Contact:    Dorothy Polhemus, LCSWA Phone Number: 11/20/2020, 3:53 PM  Clinical Narrative:                 CSW spoke with the patient at bedside and completed a very brief SDOH screening with the patient who reported not having any health insurance or primary care provider. Patient is scheduled for a PCP appointment at [facility] for 12/27/20 at 9:30am per unit secretary, Arna Medici. 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 CSW will make referral for medication assistance. CSW obtained Mr. Ballin signature for disability and a Food Stamp referral for the [facility] to reach out to them as patient indicated that would be helpful. CSW informed the patient it would be worth trying to see if this would be an extra support. CSW faxed over the Food Stamp and disability application and informed the patient that the [facility] will be in touch.  CSW provided the patient with social workers name, number, and position and to reach out to CSW as other social needs arise.CSW provided the patient with social workers name, number, and position and to reach out to CSW as other social needs arise. TOC will continue to follow throughout discharge.    Barriers to Discharge: Continued Medical Work up   Patient Goals and CMS Choice        Expected Discharge Plan and Services   In-house Referral: Clinical Social Work                                            Prior Living Arrangements/Services     Patient language and need for interpreter reviewed:: Yes        Need for Family Participation in Patient Care: No (Comment) Care giver support system in place?: No (comment)    Criminal Activity/Legal Involvement Pertinent to Current Situation/Hospitalization: No - Comment as needed  Activities of Daily Living Home Assistive Devices/Equipment: None ADL Screening (condition at time of admission) Patient's cognitive ability adequate to safely complete daily activities?: Yes Is the patient deaf or have difficulty hearing?: No Does the patient have difficulty seeing, even when wearing glasses/contacts?: No Does the patient have difficulty concentrating, remembering, or making decisions?: No Patient able to express need for assistance with ADLs?: Yes Does the patient have difficulty dressing or bathing?: No Independently performs ADLs?: Yes (appropriate for developmental age) Does the patient have difficulty walking or climbing stairs?: Yes Weakness of Legs: None Weakness of Arms/Hands: None  Permission Sought/Granted                  Emotional Assessment Appearance:: Appears stated age Attitude/Demeanor/Rapport: Engaged Affect (typically observed): Pleasant Orientation: : Oriented to Self,Oriented to Place,Oriented to  Time,Oriented to Situation   Psych Involvement: No (comment)  Admission diagnosis:  CHF (congestive heart failure) (HCC) [I50.9] Hypoxia [R09.02] Patient Active Problem List   Diagnosis Date Noted  . CHF (congestive heart failure) (HCC) 11/15/2020  . Acute on chronic systolic CHF (congestive heart failure) (HCC) 11/15/2020  . Acute respiratory failure with hypoxia (  HCC) 11/15/2020  . Tobacco abuse 11/15/2020  . Troponin level elevated 11/15/2020  . Uncontrolled type 2 diabetes mellitus with hyperglycemia (HCC) 11/26/2018  . Venous stasis dermatitis of both lower extremities 11/26/2018  . Morbid obesity with BMI of 45.0-49.9, adult (HCC) 11/26/2018  . Mild intermittent asthma without complication 11/26/2018  . Cellulitis 11/09/2018  . Lymphadenopathy 11/07/2018  . Dehydration 11/06/2018  . Hyperglycemia 11/06/2018  . Sepsis (HCC)  11/06/2018  . CAP (community acquired pneumonia) 11/06/2018  . Chronic systolic heart failure (HCC) 07/23/2016  . HTN (hypertension) 07/23/2016  . Obstructive sleep apnea on CPAP 07/23/2016  . Tobacco use 07/23/2016  . Diabetes (HCC) 07/23/2016   PCP:  Patient, No Pcp Per (Inactive) Pharmacy:   [facility] [address] [place] [address] Phone: [phone] Fax: [phone]  [facility] [address] [place] - [address] [place] [address] Phone: [phone] Fax: [phone]     Social Determinants of Health (SDOH) Interventions Food Insecurity Interventions: Intervention Not Indicated Financial Strain Interventions: Chiropodist (Comment) (Provided patient with a CAFA application referral to CH[facility] for PCP and [facility] outpatient clinic for medication assistance) Housing Interventions: Intervention Not Indicated Transportation Interventions: Intervention Not Indicated  Readmission Risk Interventions No flowsheet data found.  Erol Flanagin, MSW, LCSWA [phone] Heart Failure Social Worker

## 2020-11-20 NOTE — Consult Note (Addendum)
Advanced Heart Failure Team Consult Note   Primary Physician: Patient, No Pcp Per (Inactive) PCP-Cardiologist:  Dr. [facility])  Reason for Consultation:  Acute on Chronic Systolic Heart Failure/ Concern for Cardiac Sarcoid   HPI:    Marc Wright is seen today for evaluation of acute on chronic systolic heart failure/ concern for cardiac sarcoid, at the request of Dr. Stanford Breed, cardiology.   45 y/o male w/ chronic systolic heart failure, followed by [facility] in [place] but recently lost to f/u. Not seen there since 2020.   Systolic HF dates back to 2018. Echo at that time showed reduced LVEF 25-30% and mild MR. Per chart, no ischemic w/u pursued. Has been on Coreg and lisinopril. He failed Entresto (did not tolerate due to severe fatigue). He also has a h/o HTN, OSA on CPAP, T2DM, tobacco use, ETOH use (3-4 drinks per week), remote h/o cocaine use and abnormal chest CT's concerning for sarcoidosis, showing extensive mediastinal and bilateral hilar adenopathy as well as evidence of coronary artery calcifications.   He presented to the [facility] on 5/26 w/ complaints of SOB, pleuritic CP and LEE. EKG showed junctional tachycardia, 105 bpm. Mildy hyperkalemia at 5.4. Was markedly hypertensive, BP 227/136. Admitted to poor med compliance. Also not using CPAP. HS trop 35>>30>>37. BNP 598. Respiratory Panel both COVID and Flu negative. CXR w/ progressive opacity in the right hilar and infrahilar region. CTA of chest negative for PE. + findings for sarcoid. There was concern for possible underlying bacterial PNA. PCT however <0.10 and he was afebrile w/ normal WBCs.   He was admitted by IM and placed on IV Lasix + antibiotics w/ doxycycline and Rocephin + Solu-Medrol. Cardiology and pulmonology consulted. He is now on GDMT w/ Entresto, spiro, coreg and digoxin. BP improved. I/Os net negative 14L.   Echo shows severely reduced LVEF, 20-25%, RV mildly reduced. Also  seen to have akinesis of the basal to mid septum and basal to mid inferior wall w/ septal movement c/w LBBB. Given usual pattern of akinesis for coronary distribution, there is concern for infiltrative CM, such as sarcoid.   LHC today showed Prox RCA lesion 100% stenosed, felt to be CTO. There are R=>R collaterals, not L=>R collaterals. The prox LAD is 50% stenosed. Findings c/w mixed ischemic/ nonischemic CM. Plan to treat CAD medically. RHC showed normal filling pressures, RA pressure 2, mPCWP 8. CI 2.13 by Thermo. PA sat 60%.    Pertinent Admission Labs  Respiratory Panel both COVID and Flu negative.  BNP 598  HS trop 35>>30>>37  HIV NR  TSH 1.68  WBC 7.9 Hgb 17  Na 135 K 5.4 AST 51 ALT 44 Alk Phos 128   Sed Rate elevated at 20, CRP minimally elevated at 1.4   Hgb A1c 11.5   Drug Screen pending   2D Echo 11/16/20 1. Akinesis noted of the basal to mid septum and basal to mid inferior wall. There is also septal movement consistent with LBBB. Unusual pattern of akinesis for coronary distribution noted. Would consider infiltrative cardiomyopathy such as sarcoidosis and would recommend a cardiac MRI for clarification. Left ventricular ejection fraction, by estimation, is 20 to 25%. The left ventricle has severely decreased function. The left ventricle demonstrates regional wall motion abnormalities (see scoring diagram/findings for description). The left ventricular internal cavity size was moderately dilated. There is moderate concentric left ventricular hypertrophy. Indeterminate diastolic filling due to E-A fusion. 2. Right ventricular systolic function is mildly reduced. The  right ventricular size is mildly enlarged. There is mildly elevated pulmonary artery systolic pressure. The estimated right ventricular systolic pressure is 41.2 mmHg. 3. The mitral valve is grossly normal. Trivial mitral valve regurgitation. No evidence of mitral stenosis. 4. The aortic valve is  tricuspid. Aortic valve regurgitation is not visualized. No aortic stenosis is present. 5. The inferior vena cava is dilated in size with <50% respiratory variability, suggesting right atrial pressure of 15 mmHg.    R/LHC 11/20/20   Prox RCA lesion is 100% stenosed.  Prox LAD lesion is 50% stenosed.   1. Normal filling pressures.  2. Low cardiac output.  Mildly decreased by thermodilution which is likely more accurate as oxygen saturation was up and down during case so unsure of Fick accuracy.  3. Totally occluded RCA is likely chronic.  There are R=>R collaterals, not L=>R collaterals.  50% stenosis proximal LAD.   Suspect mixed ischemic/nonischemic cardiomyopathy.   Right Heart Pressures RHC Procedural Findings: Hemodynamics (mmHg) RA mean 2 RV 31/3 PA 30/15, mean 21 PCWP mean 8 LV 101/15 AO 100/71  Oxygen saturations: PA 60% AO 93%  Cardiac Output (Fick) 4.6  Cardiac Index (Fick) 1.64   Cardiac Output (Thermo) 5.98  Cardiac Index (Thermo) 2.13      Review of Systems: [y] = yes, [ ] = no   . General: Weight gain [ ]; Weight loss [ ]; Anorexia [ ]; Fatigue [ ]; Fever [ ]; Chills [ ]; Weakness [ ]  . Cardiac: Chest pain/pressure [ ]; Resting SOB [Y ]; Exertional SOB [Y ]; Orthopnea [ ]; Pedal Edema [ Y]; Palpitations [ ]; Syncope [ ]; Presyncope [ ]; Paroxysmal nocturnal dyspnea[ ]  . Pulmonary: Cough [ ]; Wheezing[ ]; Hemoptysis[ ]; Sputum [ ]; Snoring [ ]  . GI: Vomiting[ ]; Dysphagia[ ]; Melena[ ]; Hematochezia [ ]; Heartburn[ ]; Abdominal pain [ ]; Constipation [ ]; Diarrhea [ ]; BRBPR [ ]  . GU: Hematuria[ ]; Dysuria [ ]; Nocturia[ ]  . Vascular: Pain in legs with walking [ ]; Pain in feet with lying flat [ ]; Non-healing sores [ ]; Stroke [ ]; TIA [ ]; Slurred speech [ ];  . Neuro: Headaches[ ]; Vertigo[ ]; Seizures[ ]; Paresthesias[ ];Blurred vision [ ]; Diplopia [ ]; Vision changes [ ]  . Ortho/Skin: Arthritis [ ]; Joint pain [ ]; Muscle pain [ ]; Joint  swelling [ ]; Back Pain [ ]; Rash [ ]  . Psych: Depression[ ]; Anxiety[ ]  . Heme: Bleeding problems [ ]; Clotting disorders [ ]; Anemia [ ]  . Endocrine: Diabetes [ ]; Thyroid dysfunction[ ]  Home Medications Prior to Admission medications   Medication Sig Start Date End Date Taking? Authorizing Provider  cetirizine (ZYRTEC) 10 MG tablet Take 10 mg by mouth daily.   Yes [provider]  ibuprofen (ADVIL) 200 MG tablet Take 200 mg by mouth every 6 (six) hours as needed for moderate pain.   Yes [provider]  carvedilol (COREG) 12.5 MG tablet Take 1 tablet (12.5 mg total) by mouth 2 (two) times daily with a meal. Patient not taking: No sig reported 02/21/19   Darylene Price A, FNP  fexofenadine (ALLEGRA) 180 MG tablet Take 1 tablet (180 mg total) by mouth daily. Taking as needed Patient not taking: No sig reported 11/11/18   Georgette Shell, MD  furosemide (LASIX) 80 MG tablet Take 1 tablet (80 mg total) by mouth 2 (two) times daily. Patient not taking:  No sig reported 02/21/19   Darylene Price A, FNP  HYDROcodone-acetaminophen (NORCO/VICODIN) 5-325 MG tablet Take 2 tablets by mouth every 6 (six) hours as needed. Patient not taking: No sig reported 06/22/20   Ward, Cyril Mourning N, DO  lisinopril (ZESTRIL) 5 MG tablet Take 1 tablet (5 mg total) by mouth daily. Patient not taking: No sig reported 02/21/19   Darylene Price A, FNP  metFORMIN (GLUCOPHAGE) 500 MG tablet Take 1 tablet (500 mg total) by mouth 2 (two) times daily with a meal. Patient not taking: No sig reported 03/21/19   Darylene Price A, FNP  ondansetron (ZOFRAN ODT) 4 MG disintegrating tablet Take 1 tablet (4 mg total) by mouth every 8 (eight) hours as needed for nausea or vomiting. Patient not taking: No sig reported 06/22/20   Ward, Delice Bison, DO    Past Medical History: Past Medical History:  Diagnosis Date  . Asthma   . CHF (congestive heart failure) ([place])   . Diabetes mellitus without complication ([place])   .  OSA on CPAP   . Pneumonia 06/2016    Past Surgical History: Past Surgical History:  Procedure Laterality Date  . I & D EXTREMITY    . SKIN GRAFT      Family History: Family History  Problem Relation Age of Onset  . Congestive Heart Failure Mother   . Hypertension Mother   . Stroke Father        2017  . Asthma Father     Social History: Social History   Socioeconomic History  . Marital status: Divorced    Spouse name: Not on file  . Number of children: 1  . Years of education: Not on file  . Highest education level: Associate degree: occupational, Hotel manager, or vocational program  Occupational History  . Not on file  Tobacco Use  . Smoking status: Current Every Day Smoker    Packs/day: 0.75    Types: Cigarettes    Start date: 11/23/2016  . Smokeless tobacco: Never Used  Vaping Use  . Vaping Use: Former  . Start date: 11/23/2016  Substance and Sexual Activity  . Alcohol use: Yes    Comment: occasional   . Drug use: No  . Sexual activity: Not Currently  Other Topics Concern  . Not on file  Social History Narrative  . Not on file   Social Determinants of Health   Financial Resource Strain: Not on file  Food Insecurity: Not on file  Transportation Needs: Not on file  Physical Activity: Not on file  Stress: Not on file  Social Connections: Not on file    Allergies:  Allergies  Allergen Reactions  . Azithromycin Itching and Rash    Burning rash with intravenous dose    Objective:    Vital Signs:   Temp:  [97.5 F (36.4 C)-98.3 F (36.8 C)] 97.5 F (36.4 C) (05/31 0715) Pulse Rate:  [77-92] 86 (05/31 0908) Resp:  [10-60] 10 (05/31 0908) BP: (109-146)/(76-97) 130/97 (05/31 0908) SpO2:  [89 %-99 %] 96 % (05/31 0908) Weight:  [161.5 kg] 161.5 kg (05/31 0505) Last BM Date: 11/18/20  Weight change: Filed Weights   11/18/20 0349 11/19/20 0037 11/20/20 0505  Weight: (!) 168.4 kg (!) 165.7 kg (!) 161.5 kg    Intake/Output:   Intake/Output Summary  (Last 24 hours) at 11/20/2020 0917 Last data filed at 11/20/2020 0700 Gross per 24 hour  Intake 1557 ml  Output 6825 ml  Net -5268 ml      Physical  Exam    General:  Obese, Well appearing. No resp difficulty HEENT: normal Neck: supple. Thick neck no JVP . Carotids 2+ bilat; no bruits. No lymphadenopathy or thyromegaly appreciated. Cor: PMI nondisplaced. Regular rate & rhythm. No rubs, gallops or murmurs. Lungs: clear Abdomen: obese, soft, nontender, nondistended. No hepatosplenomegaly. No bruits or masses. Good bowel sounds. Extremities: no cyanosis, clubbing, rash, edema Neuro: alert & orientedx3, cranial nerves grossly intact. moves all 4 extremities w/o difficulty. Affect pleasant   Telemetry   NSR 80s   EKG    No new EKG to review today   Labs   Basic Metabolic Panel: Recent Labs  Lab 11/16/20 0009 11/16/20 0421 11/17/20 0155 11/18/20 0625 11/19/20 0224 11/20/20 0254  NA 137 134* 138 137 135 134*  K 3.8 4.7 3.8 3.9 3.5 4.6  CL 97* 100 95* 95* 96* 99  CO2 30 27 34* 36* 32 25  GLUCOSE 242* 411* 156* 124* 191* 200*  BUN _0 CREATININE 0.66 0.72 0.85 0.77 0.82 0.82  CALCIUM 8.8* 8.8* 9.1 9.0 9.1 9.2  MG 1.7 1.7 1.7  --   --  2.0  PHOS  --  3.7  --   --   --   --     Liver Function Tests: Recent Labs  Lab 11/16/20 0421 11/17/20 0155 11/18/20 0625 11/19/20 0224 11/20/20 0254  AST _1 32  ALT 43 _2 ALKPHOS 126 99 94 99 95  BILITOT 1.1 0.7 0.9 0.8 1.0  PROT 6.7 6.2* 6.7 6.4* 6.6  ALBUMIN 3.4* 3.1* 3.3* 3.2* 3.3*   No results for input(s): LIPASE, AMYLASE in the last 168 hours. No results for input(s): AMMONIA in the last 168 hours.  CBC: Recent Labs  Lab 11/15/20 1556 11/16/20 0421 11/18/20 0625 11/19/20 0224 11/20/20 0254  WBC 7.9 9.6 7.5 6.2 7.3  NEUTROABS 5.3 8.7* 4.4 3.6 3.9  HGB 16.5 17.1* 17.6* 17.2* 18.1*  HCT 50.9 51.7 54.6* 54.2* 55.9*  MCV 86.0 85.5 86.5 87.0 86.4  PLT 229 226 260 256 281     Cardiac Enzymes: Recent Labs  Lab 11/16/20 0009 11/16/20 0421  CKTOTAL 38* 38*    BNP: BNP (last 3 results) Recent Labs    11/15/20 1556  BNP 598.1*    ProBNP (last 3 results) No results for input(s): PROBNP in the last 8760 hours.   CBG: Recent Labs  Lab 11/19/20 1546 11/19/20 2130 11/20/20 0101 11/20/20 0452 11/20/20 0717  GLUCAP 200* 170* 267* 178* 149*    Coagulation Studies: No results for input(s): LABPROT, INR in the last 72 hours.   Imaging   CT Chest High Resolution  Result Date: 11/19/2020 CLINICAL DATA:  Persistent infiltrates on CT, evaluate round atelectasis versus true infiltrate EXAM: CT CHEST WITHOUT CONTRAST TECHNIQUE: Multidetector CT imaging of the chest was performed following the standard protocol without intravenous contrast. High resolution imaging of the lungs, as well as inspiratory and expiratory imaging, was performed. COMPARISON:  11/15/2020, 11/06/2018 FINDINGS: Cardiovascular: Cardiomegaly. Three-vessel coronary artery calcifications. No pericardial effusion. Mediastinum/Nodes: Numerous enlarged mediastinal and bilateral hilar lymph nodes are unchanged, largest pretracheal nodes measuring 2.5 x 1.9 cm (series 11, image 50). Thyroid gland, trachea, and esophagus demonstrate no significant findings. Lungs/Pleura: There are multiple dense bilateral consolidations, generally with spiculated margins and internal air bronchograms. The largest and most dense consolidations are in the bilateral superior segments of the lower lobes (series 12, image 82). These are unchanged  compared to recent CT dated 11/15/2020 but are slightly increased in size and density in comparison to the prior dated 11/06/2018. There may be some very fine associated nodularity and there is a background of fine centrilobular nodularity. No significant air trapping on expiratory phase imaging. No pleural effusion or pneumothorax. Upper Abdomen: No acute abnormality.  Musculoskeletal: No chest wall mass or suspicious bone lesions identified. IMPRESSION: 1. There are multiple dense bilateral consolidations, generally with spiculated margins and internal air bronchograms. The largest and most dense consolidations are in the bilateral superior segments of the lower lobes. These are unchanged compared to recent CT dated 11/15/2020 are slightly increased in size and density in comparison to the prior dated 11/06/2018. There may be some very fine associated nodularity. This unusual constellation of findings, in combination with enlarged mediastinal and hilar lymph nodes, suggests pulmonary sarcoidosis with progressive massive fibrosis. 2. Numerous enlarged mediastinal and bilateral hilar lymph nodes, unchanged, nonspecific, although as above, may reflect nodal sarcoidosis. 3. Cardiomegaly and coronary artery disease. Electronically Signed   By: Eddie Candle M.D.   On: 11/19/2020 17:21      Medications:     Current Medications: . [MAR Hold] acetaZOLAMIDE  250 mg Oral BID  . [MAR Hold] carvedilol  3.125 mg Oral BID WC  . [MAR Hold] enoxaparin (LOVENOX) injection  85 mg Subcutaneous Q24H  . [MAR Hold] furosemide  40 mg Intravenous BID  . [MAR Hold] insulin aspart  0-15 Units Subcutaneous Q4H  . [MAR Hold] nicotine  21 mg Transdermal Daily  . [MAR Hold] sacubitril-valsartan  1 tablet Oral BID  . [MAR Hold] sodium chloride flush  3 mL Intravenous Q12H  . [MAR Hold] sodium chloride flush  3 mL Intravenous Q12H  . [MAR Hold] spironolactone  25 mg Oral Daily     Infusions: . [MAR Hold] sodium chloride    . sodium chloride    . sodium chloride        Assessment/Plan   1. Acute on Chronic Systolic Heart Failure - Echo 2018: LVEF 25-30% and mild MR - LHC today w/ CTO of pRCA w/ R>>R collaterals + 50% prox LAD. Findings c/w mixed ischemic and nonischemic CM  - Echo w/ unusual akinesis pattern concerning for infiltrative CM + abnormal chest CT w/ extensive  mediastinal and bilateral hilar adenopathy concerning for sarcoidosis.  - plan cMRI to r/o cardiac sarcoid  - Continue GDMT and titrate as BP allows   Entresto 49-51 bid  Spiro 25 daily   Coreg 3.125 mg bid  Digoxin 0.125 mg daily   No SGLT2i yet given Hgb A1c >10  - Hold IV diuretics for now. mRAP 2 on RHC. Can continue Diamox w/ elevated CO2  - medical management for CAD - needs to improve compliance w/ CPAP - needs to reduce ETOH intake  - h/o poor med compliance. May benefit from paramedicine   2. ? Pulmonary Sarcoidosis  - Per PCCM, sarcoid possible but lower lobe and appearence of infiltrates is atypical - Plan Video Bronchoscopy w/ endobronchial [place] tomorrow  - Defer steroids to PCCM   3. HTN - markedly elevated on admit, in setting of poor med compliance - now improved w/ meds - continue to titrate GDMT   4. CAD - cath w/ CTO of pRCA w/ R>>R collaterals + 50% prox LAD - medical management  - ASA 81 - Crestor 20. LDL goal < 70 - Continue  blocker - smoking cessation advised   5. T2DM - poorly controlled,  Hgb A1c 11.5 - insulin per primary   6. Tobacco Abuse - smoking cessation advised  7. OSA - poor compliance w/ home CPAP  - encouraged improved compliance    Length of Stay: [address] PA-C  11/20/2020, 9:17 AM  Advanced Heart Failure Team Pager [phone] (M-F; 7a - 5p)  Please contact [facility] for night-coverage after hours (4p -7a ) and weekends on [email or web address]  Patient seen with PA, agree with the above note.   Admitted with dyspnea, CHF exacerbation (EF low known back to 2018), also with abnormal chest imaging.    Patient was diuresed.  High resolution CT chest concerning for possible pulmonary sarcoidosis.   Echo with EF 20-25%, dyssynchrony, basal-mid inferior AK, moderate LVH, mildly decreased RV systolic function.  LHC/RHC today showed totally occluded RCA, likely chronic.  Filling pressures were normal but cardiac index was low.    General: NAD, obese.  Neck: Thick. No JVD, no thyromegaly or thyroid nodule.  Lungs: Clear to auscultation bilaterally with normal respiratory effort. CV: Nondisplaced PMI.  Heart regular S1/S2, no S3/S4, no murmur.  1+ ankle edema.  Abdomen: Soft, nontender, no hepatosplenomegaly, no distention.  Skin: Intact without lesions or rashes.  Neurologic: Alert and oriented x 3.  Psych: Normal affect. Extremities: No clubbing or cyanosis.  HEENT: Normal.   1. 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  Echo this admission with basal-mid inferior akinesis, dyssynchrony from LBBB, EF 20-25%, moderate LVH, mildly decreased RV function.  RHC/LHC today with normal filling pressures, marginal CI (2.1 by thermodilution is likely more accurate than Fick); cath showed 50% proximal LAD and occluded RCA (chronic).  Extent of cardiomyopathy is not explained by coronary disease, suspect there is a nonischemic component that could potentially be from cardiac sarcoidosis.  On exam, not volume overloaded.   - Start digoxin 0.125 daily.  - Continue current Entresto, spironolactone, and Coreg.  - Will not add SGLT2 inhibitor yet with uncontrolled diabetes.   - Can stop IV Lasix today, will likely start back on po diuretic tomorrow.  - Cardiac MRI to look for evidence for cardiac sarcoidosis, may need eventual cardiac PET.  2. CAD: Presentation with CHF, not ACS.  LHC with 50% pLAD, totally occluded proximal RCA with R=>R collaterals (old occlusion).  Reviewed with interventional cardiology, would hold off on PCI attempt at this time as CTO.  - ASA 81 daily - Crestor 20 mg daily.  3. OHS/OSA: Needs CPAP, on oxygen during the day.  4. Pulmonary: high resolution CT chest showed dense bilateral consolidations and enlarged hilar/mediastinal nodes.  ?Pulmonary sarcoidosis. Does not appear infectious.  Sarcoid may be unifying diagnosis for heart and lungs.  Burtis Junes bronchoscopy/biopsy for  definitive diagnosis. Pulmonary following.  5. Smoking: Advised to quit.  6. Type 2 diabetes: Per primary service.   Loralie Champagne 11/20/2020 3:22 PM

## 2020-11-20 NOTE — Progress Notes (Addendum)
PROGRESS NOTE   KENNEDY BOHANON  TMB:[id number] DOB: Sep 12, 1975 DOA: 11/15/2020 PCP: Patient, No Pcp Per (Inactive)  Brief Narrative:  26 white male BMI 49 HFrEF 25% OSA noncompliant on CPAP DM TY 2 Current tobacco abuse half pack per day Occasional THC remote cocaine nothing recently Patient reportedly noncompliant on meds/CPAP  Present [facility] ED sore throat sputum difficulty breathing-at home self treated with several dose of Lasix Baseline does have orthopnea but has worsened over past several weeks  Rx in ED Lasix given BNP was 500 lactic acid normal CXR = right hilar opacity CT showing multifocal consolidation  Cardiology and pulmonology consulted   he was diuresed relatively aggressively looks like he might of become hypercarbic on 5/29 and mandatory BiPAP was enforced--at night and he is improved R/L cath 5/31 as below 100% RCA stenosis Await bronchoscopy 6/1 and then decide on disposition date  Planned disposition is home  Hospital-Problem based course  Acute decompensated HFrEF EF 25% Current meds: Lasix 40 twice daily Coreg 3.125  Aldactone 25, Entresto- cardiology adjusting Net I/O: = -14 L, weight 180-->161 kilograms Diamox 250 bid 5/29 CAP Pneumonia Continue doxycycline only-stop ceftriaxone 5/28 Stopping doxycycline 5/30 after CT chest-see below Strep pneumo is negative, Legionella in process still Sarcoid granulomatous disease[?] ACE level is elevated Pulmonology has ordered a battery of labs on 5/30--follow Echo 5/27 = infiltrative pattern with akinesis EF 20-25% ESR and CRP elevated ANA is negative ?  MRI of heart to denote sarcoid High-res CT 5/30 = dense bilateral consolidations spiculated bronchograms with + enlarged mediastinal/hilar lymph nodes with progressive massive fibrosis Bronchoscopy planned6/1 Hyperglycemia without diagnosis of diabetes mellitus A1c reported above 11-- Will wait 24 hours then add metformin 500 twice daily given recent heart cath  5/31 Will need significant education regarding weight diet DM TY 2 etc. etc. Smoker Stop steroids-not wheezing-not severely immunocompromised BMI 46 Patient habitus life-threatening Consider outpatient possible GOP-outpatient bariatric referral?   DVT prophylaxis: Lovenox Code Status: Full Family Communication: Called brother to Clair Gulling [phone] Disposition:  Status is: Inpatient  Remains inpatient appropriate because:Persistent severe electrolyte disturbances, Unsafe d/c plan and IV treatments appropriate due to intensity of illness or inability to take PO  Dispo: The patient is from: Home              Anticipated d/c is to: Home              Patient currently is not medically stable to d/c.   Difficult to place patient No  Consultants:   Cardiology  Pulmonology  Procedures:    Cath conclusion 11/21/19     Prox RCA lesion is 100% stenosed.  Prox LAD lesion is 50% stenosed.   1. Normal filling pressures.  2. Low cardiac output.  Mildly decreased by thermodilution which is likely more accurate as oxygen saturation was up and down during case so unsure of Fick accuracy.  3. Totally occluded RCA is likely chronic.  There are R=>R collaterals, not L=>R collaterals.  50% stenosis proximal LAD.    Suspect mixed ischemic/nonischemic cardiomyopathy.   Antimicrobials: As above   Subjective:  Awake alert oriented in nad No focal deficit Back from Cath lab and is ok  Objective: Vitals:   11/20/20 0853 11/20/20 0858 11/20/20 0903 11/20/20 0908  BP: 136/88 (!) 137/96 135/90 (!) 130/97  Pulse: 84 81 80 86  Resp: 17 14 14 10   Temp:      TempSrc:      SpO2: 98% 96% 97% 96%  Weight:      Height:        Intake/Output Summary (Last 24 hours) at 11/20/2020 1426 Last data filed at 11/20/2020 1258 Gross per 24 hour  Intake 1317 ml  Output 5450 ml  Net -4133 ml   Filed Weights   11/18/20 0349 11/19/20 0037 11/20/20 0505  Weight: (!) 168.4 kg (!) 165.7 kg (!) 161.5 kg     Examination:   Well no distress thick neck no rales rhonchi abd obese nt nd + edema LE but less than prior  Neuro intact no focal deficit Psych euthymic  Data Reviewed: personally reviewed   CBC    Component Value Date/Time   WBC 7.3 11/20/2020 0254   RBC 6.47 (H) 11/20/2020 0254   HGB 18.1 (H) 11/20/2020 0254   HGB 15.6 11/24/2018 1204   HCT 55.9 (H) 11/20/2020 0254   HCT 48.9 11/24/2018 1204   PLT 281 11/20/2020 0254   PLT 562 (H) 11/24/2018 1204   MCV 86.4 11/20/2020 0254   MCV 88 11/24/2018 1204   MCH 28.0 11/20/2020 0254   MCHC 32.4 11/20/2020 0254   RDW 13.9 11/20/2020 0254   RDW 13.9 11/24/2018 1204   LYMPHSABS 2.1 11/20/2020 0254   LYMPHSABS 2.0 11/24/2018 1204   MONOABS 0.7 11/20/2020 0254   EOSABS 0.5 11/20/2020 0254   EOSABS 0.4 11/24/2018 1204   BASOSABS 0.1 11/20/2020 0254   BASOSABS 0.1 11/24/2018 1204   CMP Latest Ref Rng & Units 11/20/2020 11/19/2020 11/18/2020  Glucose 70 - 99 mg/dL 200(H) 191(H) 124(H)  BUN 6 - 20 mg/dL 19 17 15   Creatinine 0.61 - 1.24 mg/dL 0.82 0.82 0.77  Sodium 135 - 145 mmol/L 134(L) 135 137  Potassium 3.5 - 5.1 mmol/L 4.6 3.5 3.9  Chloride 98 - 111 mmol/L 99 96(L) 95(L)  CO2 22 - 32 mmol/L 25 32 36(H)  Calcium 8.9 - 10.3 mg/dL 9.2 9.1 9.0  Total Protein 6.5 - 8.1 g/dL 6.6 6.4(L) 6.7  Total Bilirubin 0.3 - 1.2 mg/dL 1.0 0.8 0.9  Alkaline Phos 38 - 126 U/L 95 99 94  AST 15 - 41 U/L 32 17 16  ALT 0 - 44 U/L 31 27 28      Radiology Studies: CARDIAC CATHETERIZATION  Result Date: 11/20/2020  Prox RCA lesion is 100% stenosed.  Prox LAD lesion is 50% stenosed.  1. Normal filling pressures. 2. Low cardiac output.  Mildly decreased by thermodilution which is likely more accurate as oxygen saturation was up and down during case so unsure of Fick accuracy. 3. Totally occluded RCA is likely chronic.  There are R=>R collaterals, not L=>R collaterals.  50% stenosis proximal LAD. Suspect mixed ischemic/nonischemic cardiomyopathy.    CT Chest High Resolution  Result Date: 11/19/2020 CLINICAL DATA:  Persistent infiltrates on CT, evaluate round atelectasis versus true infiltrate EXAM: CT CHEST WITHOUT CONTRAST TECHNIQUE: Multidetector CT imaging of the chest was performed following the standard protocol without intravenous contrast. High resolution imaging of the lungs, as well as inspiratory and expiratory imaging, was performed. COMPARISON:  11/15/2020, 11/06/2018 FINDINGS: Cardiovascular: Cardiomegaly. Three-vessel coronary artery calcifications. No pericardial effusion. Mediastinum/Nodes: Numerous enlarged mediastinal and bilateral hilar lymph nodes are unchanged, largest pretracheal nodes measuring 2.5 x 1.9 cm (series 11, image 50). Thyroid gland, trachea, and esophagus demonstrate no significant findings. Lungs/Pleura: There are multiple dense bilateral consolidations, generally with spiculated margins and internal air bronchograms. The largest and most dense consolidations are in the bilateral superior segments of the lower lobes (series 12, image 82).  These are unchanged compared to recent CT dated 11/15/2020 but are slightly increased in size and density in comparison to the prior dated 11/06/2018. There may be some very fine associated nodularity and there is a background of fine centrilobular nodularity. No significant air trapping on expiratory phase imaging. No pleural effusion or pneumothorax. Upper Abdomen: No acute abnormality. Musculoskeletal: No chest wall mass or suspicious bone lesions identified. IMPRESSION: 1. There are multiple dense bilateral consolidations, generally with spiculated margins and internal air bronchograms. The largest and most dense consolidations are in the bilateral superior segments of the lower lobes. These are unchanged compared to recent CT dated 11/15/2020 are slightly increased in size and density in comparison to the prior dated 11/06/2018. There may be some very fine associated nodularity.  This unusual constellation of findings, in combination with enlarged mediastinal and hilar lymph nodes, suggests pulmonary sarcoidosis with progressive massive fibrosis. 2. Numerous enlarged mediastinal and bilateral hilar lymph nodes, unchanged, nonspecific, although as above, may reflect nodal sarcoidosis. 3. Cardiomegaly and coronary artery disease. Electronically Signed   By: Eddie Candle M.D.   On: 11/19/2020 17:21     Scheduled Meds: . acetaZOLAMIDE  250 mg Oral BID  . carvedilol  3.125 mg Oral BID WC  . digoxin  0.125 mg Oral Daily  . [START ON 11/21/2020] enoxaparin (LOVENOX) injection  80 mg Subcutaneous Q24H  . insulin aspart  0-15 Units Subcutaneous Q4H  . nicotine  21 mg Transdermal Daily  . rosuvastatin  20 mg Oral Daily  . sacubitril-valsartan  1 tablet Oral BID  . sodium chloride flush  3 mL Intravenous Q12H  . sodium chloride flush  3 mL Intravenous Q12H  . sodium chloride flush  3 mL Intravenous Q12H  . spironolactone  25 mg Oral Daily   Continuous Infusions: . sodium chloride    . sodium chloride       LOS: 5 days   Time spent: 37 minutes  Nita Sells, MD [facility] To contact the attending provider between 7A-7P or the covering provider during after hours 7P-7A, please log into the web site [email or web address] and access using universal [place] password for that web site. If you do not have the password, please call the hospital operator.  11/20/2020, 2:26 PM

## 2020-11-20 NOTE — Progress Notes (Addendum)
NAME:  Marc Wright, MRN:  [id number], DOB:  Oct 04, 1975, LOS: 5 ADMISSION DATE:  11/15/2020, CONSULTATION DATE:  11/20/20 REFERRING MD:  TR[facility] CHIEF COMPLAINT: Shortness of breath  BRIEF  45 year old whom we are seeing in consultation for evaluation of abnormal chest imaging hypoxemia.  ED and H&P notes reviewed.  Patient is 1 week history of worsening dyspnea on exertion.  Associated with increased nasal congestion.  Developed shortness of breath at rest.  Shortness of breath worse when lying down, improves when sitting up.  Noted increasing lower extremity swelling more so on the left leg than the right over the same timeframe.  Denies respiratory issues with pollen earlier in the spring.  Denies any wheezing.  Worsened over time which prompted presentation to ED.  There BNP elevated.  Chest x-ray congested.  CT scan with bilateral) right greater than left pleural effusions with mild interlobular septal thickening.  Had several episodes of infiltrate most notably in bilateral lower lobes.  Reviewed CT scan 2020 monitor patient with similar appearing infiltrates although more prominent this admission.  No fever or chills.  Does have mild cough, sounds almost blood-tinged.  Reviewed labs extensively no leukocytosis, no fever here, procalcitonin negative x2.  Received Lasix.  He is feeling better.  Weaning oxygen.  Notes he desats when he sleeps but has known sleep apnea.  Is not surprising.  Pertinent  Medical History  Cardiomyopathy, asthma, seasonal allergies  Significant Hospital Events: Including procedures, antibiotic start and stop dates in addition to other pertinent events   . 5/25-10/2024 admitted to hospital, began diuresed, got dose of Solu-Medrol 60 mg IV x1 . 5/27 pulmonary consulted . 5/30 Most respiratory symptoms resolved or markedly improved,   Interim History / Subjective:   5/31  - cath results - below. Dr Loralie Champagne ok for patient to undergo bronch/EBUS under [facility] while as  inpatient based on cath results. Patient agreeable to undergo bronch as inpatient. Dr Tamala Julian Linna Hoff) will be operator and plans for 1pm 11/21/20  patient aware. Sarcoid is leading differential for pulmonary infilrates and cardiomyopathy. ACE level May 2022 > 100   R/LHC 11/20/20   Prox RCA lesion is 100% stenosed.  Prox LAD lesion is 50% stenosed.  1. Normal filling pressures.  2. Low cardiac output. Mildly decreased by thermodilution which is likely more accurate as oxygen saturation was up and down during case so unsure of Fick accuracy.  3. Totally occluded RCA is likely chronic. There are R=>R collaterals, not L=>R collaterals. 50% stenosis proximal LAD.   Suspect mixed ischemic/nonischemic cardiomyopathy.   Right Heart Pressures RHC Procedural Findings: Hemodynamics (mmHg) RA mean 2 RV 31/3 PA 30/15, mean 21 PCWP mean 8 LV 101/15 AO 100/71  Oxygen saturations: PA 60% AO 93%  Cardiac Output (Fick) 4.6  Cardiac Index (Fick) 1.64   Cardiac Output (Thermo) 5.98  Cardiac Index (Thermo) 2.13   CT chest 11/19/20  Lungs/Pleura: There are multiple dense bilateral consolidations, generally with spiculated margins and internal air bronchograms. The largest and most dense consolidations are in the bilateral superior segments of the lower lobes (series 12, image 82). These are unchanged compared to recent CT dated 11/15/2020 but are slightly increased in size and density in comparison to the prior dated 11/06/2018. There may be some very fine associated nodularity and there is a background of fine centrilobular nodularity. No significant air trapping on expiratory phase imaging. No pleural effusion or pneumothorax.   Mediastinum/Nodes: Numerous enlarged mediastinal and bilateral hilar lymph nodes  are unchanged, largest pretracheal nodes measuring 2.5 x 1.9 cm (series 11, image 50). Thyroid gland, trachea, and esophagus demonstrate no significant  findings.  IMPRESSION: 1. There are multiple dense bilateral consolidations, generally with spiculated margins and internal air bronchograms. The largest and most dense consolidations are in the bilateral superior segments of the lower lobes. These are unchanged compared to recent CT dated 11/15/2020 are slightly increased in size and density in comparison to the prior dated 11/06/2018. There may be some very fine associated nodularity. This unusual constellation of findings, in combination with enlarged mediastinal and hilar lymph nodes, suggests pulmonary sarcoidosis with progressive massive fibrosis. 2. Numerous enlarged mediastinal and bilateral hilar lymph nodes, unchanged, nonspecific, although as above, may reflect nodal sarcoidosis. 3. Cardiomegaly and coronary artery disease.   Electronically Signed   By: Eddie Candle M.D.   On: 11/19/2020 17:21  Results for [name], [name] (MRN [id number]) as of 11/20/2020 15:45  Ref. Range 11/15/2020 22:59 11/15/2020 23:00  Strep Pneumo Urinary Antigen Latest Ref Range: NEGATIVE  NEGATIVE   L. pneumophila Serogp 1 Ur Ag Latest Ref Range: Negative   Negative    Objective   Blood pressure (!) 130/97, pulse 86, temperature (!) 97.5 F (36.4 C), temperature source Oral, resp. rate 10, height 6\' 3"  (1.905 m), weight (!) 161.5 kg, SpO2 96 %.        Intake/Output Summary (Last 24 hours) at 11/20/2020 1535 Last data filed at 11/20/2020 1258 Gross per 24 hour  Intake 1317 ml  Output 5450 ml  Net -4133 ml   Filed Weights   11/18/20 0349 11/19/20 0037 11/20/20 0505  Weight: (!) 168.4 kg (!) 165.7 kg (!) 161.5 kg    Examination: General Appearance:    Alert, cooperative, no distress, appears stated age - yes , sitting on - bed  Head:    Normocephalic, without obvious abnormality, atraumatic  Eyes:    PERRL, conjunctiva/corneas clear,  Ears:    Normal TM's and external ear canals, both ears  Nose:   Nares normal, septum midline, mucosa  normal, no drainage    or sinus tenderness. OXYGEN ON no @ ra  Throat:   Lips, mucosa, and tongue normal; teeth and gums normal. Cyanosis on lips - no  Neck:   Supple, symmetrical, trachea midline, no adenopathy;    thyroid:  no enlargement/tenderness/nodules; no carotid   bruit or JVD  Back:     Symmetric, no curvature, ROM normal, no CVA tenderness  Lungs:     Distress - no , Wheeze no, Barrell Chest - no, Purse lip breathing - no, Crackles - no   Chest Wall:    No tenderness or deformity. Scars in chest no   Heart:    Regular rate and rhythm, S1 and S2 normal, no murmur, rub   or gallop  Breast Exam:    NOT DONE  Abdomen:     Soft, non-tender, bowel sounds active all four quadrants,    no masses, no organomegaly  Genitalia:   NOT DONE  Rectal:   NOT DONE  Extremities:   Extremities normal, atraumatic, Clubbing - no, Edema - no  Pulses:   2+ and symmetric all extremities  Skin:   Stigmata of Connective Tissue Disease - no  Lymph nodes:   Cervical, supraclavicular, and axillary nodes normal  Psychiatric:  Neurologic:   pleasant CNII-XII intact, normal strength, sensation  throughout          Labs/imaging that I havepersonally reviewed  Resolved Hospital Problem list   n/a  Assessment & Plan:  Persistent bilateral LL infiltrates with High ACE level + Mediastinal Adenopathy : . Similar albeit slightly more prominent infiltrates compared to 2020.  RHC with normal PCWP. Sarcoid leading ddx esp with systolic cardiomyopathy  Plan  - check Quant gOld, ANA, RF, CCP, GBM, ANCA panel, scl-70, ssa/ssb  - Aim for General Anesthesia Flex bronch with TTbx and EBUS by Dr Erskine Emery 11/21/20 approx 1 pm  - Risks of pneumothorax, hemothorax, sedation/anesthesia complications such as cardiac or respiratory arrest or hypotension, stroke and bleeding all explained. Benefits of diagnosis but limitations of non-diagnosis also explained. Patient verbalized understanding and agreed to proceed.    - dc lovenox (? Last dose early am 11/20/20)   - ok for baby asppirin starting 11/21/20 (not a home med)  - needs extended obs in PACU (has hypercapnia baseline)  - NPO except meds from 5.30am 11/21/20   OSA: Diagnosed in past, did not tolerate CPAP.  5/29 ABG with hypercapnia  --Night time O2, can repeat PSG as outpatient  - might need BiPAP instead of cPAP    Best practice (right click and "Reselect all SmartList Selections" daily)  Per primary     SIGNATURE    Dr. Brand Males, M.D., F.C.C.P,  Pulmonary and Critical Care Medicine Staff Physician, [place] Director - Interstitial Lung Disease  Program  Pulmonary [place] at [place], [place], [address]  Pager: [phone], If no answer  OR between  19:00-7:00h: page [phone] Telephone (clinical office): [phone] Telephone (research): [phone]  3:59 PM 11/20/2020

## 2020-11-20 NOTE — Plan of Care (Signed)
  Problem: Clinical Measurements: Goal: Respiratory complications will improve Outcome: Progressing   Problem: Activity: Goal: Risk for activity intolerance will decrease Outcome: Progressing   

## 2020-11-20 NOTE — Interval H&P Note (Signed)
History and Physical Interval Note:  11/20/2020 8:25 AM  Marc Wright  has presented today for surgery, with the diagnosis of HF.  The various methods of treatment have been discussed with the patient and family. After consideration of risks, benefits and other options for treatment, the patient has consented to  Procedure(s): RIGHT/LEFT HEART CATH AND CORONARY ANGIOGRAPHY (N/A) as a surgical intervention.  The patient's history has been reviewed, patient examined, no change in status, stable for surgery.  I have reviewed the patient's chart and labs.  Questions were answered to the patient's satisfaction.     Marc Wright [employer]

## 2020-11-21 ENCOUNTER — Inpatient Hospital Stay (HOSPITAL_COMMUNITY): Payer: Medicaid Other | Admitting: Anesthesiology

## 2020-11-21 ENCOUNTER — Encounter (HOSPITAL_COMMUNITY): Payer: Self-pay | Admitting: Internal Medicine

## 2020-11-21 ENCOUNTER — Inpatient Hospital Stay (HOSPITAL_COMMUNITY): Payer: Medicaid Other

## 2020-11-21 ENCOUNTER — Encounter (HOSPITAL_COMMUNITY)
Admission: EM | Disposition: A | Discharge status: Home / Self Care | Payer: Self-pay | Source: Home / Self Care | Attending: Family Medicine

## 2020-11-21 ENCOUNTER — Inpatient Hospital Stay: Payer: Self-pay

## 2020-11-21 DIAGNOSIS — I5023 Acute on chronic systolic (congestive) heart failure: Secondary | ICD-10-CM

## 2020-11-21 DIAGNOSIS — R9389 Abnormal findings on diagnostic imaging of other specified body structures: Secondary | ICD-10-CM

## 2020-11-21 HISTORY — PX: VIDEO BRONCHOSCOPY WITH ENDOBRONCHIAL ULTRASOUND: SHX6177

## 2020-11-21 HISTORY — PX: BRONCHIAL BIOPSY: SHX5109

## 2020-11-21 HISTORY — PX: BRONCHIAL NEEDLE ASPIRATION BIOPSY: SHX5106

## 2020-11-21 LAB — CULTURE, BLOOD (ROUTINE X 2)
Culture: NO GROWTH
Culture: NO GROWTH
Special Requests: ADEQUATE
Special Requests: ADEQUATE

## 2020-11-21 LAB — CBC WITH DIFFERENTIAL/PLATELET
Abs Immature Granulocytes: 0.06 10*3/uL (ref 0.00–0.07)
Basophils Absolute: 0.1 10*3/uL (ref 0.0–0.1)
Basophils Relative: 1 %
Eosinophils Absolute: 0.4 10*3/uL (ref 0.0–0.5)
Eosinophils Relative: 7 %
HCT: 58.5 % — ABNORMAL HIGH (ref 39.0–52.0)
Hemoglobin: 19.4 g/dL — ABNORMAL HIGH (ref 13.0–17.0)
Immature Granulocytes: 1 %
Lymphocytes Relative: 22 %
Lymphs Abs: 1.4 10*3/uL (ref 0.7–4.0)
MCH: 28.4 pg (ref 26.0–34.0)
MCHC: 33.2 g/dL (ref 30.0–36.0)
MCV: 85.5 fL (ref 80.0–100.0)
Monocytes Absolute: 0.5 10*3/uL (ref 0.1–1.0)
Monocytes Relative: 8 %
Neutro Abs: 3.7 10*3/uL (ref 1.7–7.7)
Neutrophils Relative %: 61 %
Platelets: 231 10*3/uL (ref 150–400)
RBC: 6.84 MIL/uL — ABNORMAL HIGH (ref 4.22–5.81)
RDW: 14.6 % (ref 11.5–15.5)
WBC: 6.1 10*3/uL (ref 4.0–10.5)
nRBC: 0 % (ref 0.0–0.2)

## 2020-11-21 LAB — POCT I-STAT 7, (LYTES, BLD GAS, ICA,H+H)
Acid-base deficit: 9 mmol/L — ABNORMAL HIGH (ref 0.0–2.0)
Bicarbonate: 17.9 mmol/L — ABNORMAL LOW (ref 20.0–28.0)
Calcium, Ion: 1.11 mmol/L — ABNORMAL LOW (ref 1.15–1.40)
HCT: 52 % (ref 39.0–52.0)
Hemoglobin: 17.7 g/dL — ABNORMAL HIGH (ref 13.0–17.0)
O2 Saturation: 96 %
Patient temperature: 97.7
Potassium: 4.2 mmol/L (ref 3.5–5.1)
Sodium: 141 mmol/L (ref 135–145)
TCO2: 19 mmol/L — ABNORMAL LOW (ref 22–32)
pCO2 arterial: 40.6 mmHg (ref 32.0–48.0)
pH, Arterial: 7.25 — ABNORMAL LOW (ref 7.350–7.450)
pO2, Arterial: 95 mmHg (ref 83.0–108.0)

## 2020-11-21 LAB — COMPREHENSIVE METABOLIC PANEL
ALT: 42 U/L (ref 0–44)
AST: 27 U/L (ref 15–41)
Albumin: 3.3 g/dL — ABNORMAL LOW (ref 3.5–5.0)
Alkaline Phosphatase: 96 U/L (ref 38–126)
Anion gap: 10 (ref 5–15)
BUN: 17 mg/dL (ref 6–20)
CO2: 23 mmol/L (ref 22–32)
Calcium: 9.3 mg/dL (ref 8.9–10.3)
Chloride: 103 mmol/L (ref 98–111)
Creatinine, Ser: 0.75 mg/dL (ref 0.61–1.24)
GFR, Estimated: >60 mL/min (ref >60)
Glucose, Bld: 153 mg/dL — ABNORMAL HIGH (ref 70–99)
Potassium: 3.9 mmol/L (ref 3.5–5.1)
Sodium: 136 mmol/L (ref 135–145)
Total Bilirubin: 0.6 mg/dL (ref 0.3–1.2)
Total Protein: 6.5 g/dL (ref 6.5–8.1)

## 2020-11-21 LAB — GLUCOSE, CAPILLARY
Glucose-Capillary: 167 mg/dL — ABNORMAL HIGH (ref 70–99)
Glucose-Capillary: 127 mg/dL — ABNORMAL HIGH (ref 70–99)
Glucose-Capillary: 189 mg/dL — ABNORMAL HIGH (ref 70–99)
Glucose-Capillary: 186 mg/dL — ABNORMAL HIGH (ref 70–99)
Glucose-Capillary: 235 mg/dL — ABNORMAL HIGH (ref 70–99)
Glucose-Capillary: 280 mg/dL — ABNORMAL HIGH (ref 70–99)

## 2020-11-21 LAB — SEDIMENTATION RATE: Sed Rate: 0 mm/hr (ref 0–16)

## 2020-11-21 SURGERY — BRONCHOSCOPY, WITH EBUS
Anesthesia: General

## 2020-11-21 MED ORDER — DEXMEDETOMIDINE HCL IN NACL 400 MCG/100ML IV SOLN
0.4000 ug/kg/h | INTRAVENOUS | Status: DC
  Administered 2020-11-21: 1.2 ug/kg/h via INTRAVENOUS
  Filled 2020-11-21: qty 100

## 2020-11-21 MED ORDER — PROPOFOL 1000 MG/100ML IV EMUL: 5.0000 ug/kg/min | INTRAVENOUS | Status: DC

## 2020-11-21 MED ORDER — ORAL CARE MOUTH RINSE: 15.0000 mL | OROMUCOSAL | Status: DC

## 2020-11-21 MED ORDER — IPRATROPIUM-ALBUTEROL 0.5-2.5 (3) MG/3ML IN SOLN
3.0000 mL | Freq: Four times a day (QID) | RESPIRATORY_TRACT | Status: DC
  Administered 2020-11-21 – 2020-11-23 (×8): 3 mL via RESPIRATORY_TRACT
  Filled 2020-11-21 (×8): qty 3

## 2020-11-21 MED ORDER — SODIUM CHLORIDE 0.9 % IV SOLN
250.0000 mL | INTRAVENOUS | Status: DC
  Administered 2020-11-21: 250 mL via INTRAVENOUS

## 2020-11-21 MED ORDER — FENTANYL CITRATE (PF) 250 MCG/5ML IJ SOLN
INTRAMUSCULAR | Status: DC | PRN
  Administered 2020-11-21: 100 ug via INTRAVENOUS

## 2020-11-21 MED ORDER — POLYETHYLENE GLYCOL 3350 17 G PO PACK
17.0000 g | PACK | Freq: Every day | ORAL | Status: DC
  Administered 2020-11-22 – 2020-11-23 (×2): 17 g
  Filled 2020-11-21 (×2): qty 1

## 2020-11-21 MED ORDER — PROPOFOL 10 MG/ML IV BOLUS
INTRAVENOUS | Status: DC | PRN
  Administered 2020-11-21: 110 mg via INTRAVENOUS

## 2020-11-21 MED ORDER — HYDROCODONE-ACETAMINOPHEN 5-325 MG PO TABS: 1.0000 | ORAL_TABLET | ORAL | Status: DC | PRN

## 2020-11-21 MED ORDER — CHLORHEXIDINE GLUCONATE 0.12% ORAL RINSE (MEDLINE KIT)
15.0000 mL | Freq: Two times a day (BID) | OROMUCOSAL | Status: DC
  Administered 2020-11-21 – 2020-11-23 (×4): 15 mL via OROMUCOSAL

## 2020-11-21 MED ORDER — SPIRONOLACTONE 25 MG PO TABS: 25.0000 mg | ORAL_TABLET | Freq: Every day | ORAL | Status: DC

## 2020-11-21 MED ORDER — NOREPINEPHRINE 4 MG/250ML-% IV SOLN
0.0000 ug/min | INTRAVENOUS | Status: DC
  Administered 2020-11-21: 10 ug/min via INTRAVENOUS
  Administered 2020-11-22 (×2): 7 ug/min via INTRAVENOUS
  Administered 2020-11-23: 5 ug/min via INTRAVENOUS
  Filled 2020-11-21 (×3): qty 250

## 2020-11-21 MED ORDER — TRANEXAMIC ACID FOR INHALATION
500.0000 mg | Freq: Three times a day (TID) | RESPIRATORY_TRACT | Status: AC
  Administered 2020-11-21 – 2020-11-23 (×5): 500 mg via RESPIRATORY_TRACT
  Filled 2020-11-21 (×6): qty 10

## 2020-11-21 MED ORDER — FUROSEMIDE 40 MG PO TABS: 20.0000 mg | ORAL_TABLET | Freq: Every day | ORAL | Status: DC

## 2020-11-21 MED ORDER — MIDAZOLAM HCL 2 MG/2ML IJ SOLN
INTRAMUSCULAR | Status: AC
  Administered 2020-11-21: 2 mg
  Filled 2020-11-21: qty 2

## 2020-11-21 MED ORDER — PHENYLEPHRINE 40 MCG/ML (10ML) SYRINGE FOR IV PUSH (FOR BLOOD PRESSURE SUPPORT)
PREFILLED_SYRINGE | INTRAVENOUS | Status: DC | PRN
  Administered 2020-11-21: 120 ug via INTRAVENOUS

## 2020-11-21 MED ORDER — METHYLPREDNISOLONE SODIUM SUCC 125 MG IJ SOLR
40.0000 mg | Freq: Two times a day (BID) | INTRAMUSCULAR | Status: DC
  Administered 2020-11-21 – 2020-11-25 (×8): 40 mg via INTRAVENOUS
  Filled 2020-11-21 (×8): qty 2

## 2020-11-21 MED ORDER — INSULIN DETEMIR 100 UNIT/ML ~~LOC~~ SOLN
10.0000 [IU] | Freq: Two times a day (BID) | SUBCUTANEOUS | Status: DC
  Filled 2020-11-21 (×2): qty 0.1

## 2020-11-21 MED ORDER — LIDOCAINE HCL (PF) 1 % IJ SOLN
INTRAMUSCULAR | Status: AC
  Filled 2020-11-21: qty 30

## 2020-11-21 MED ORDER — PHENYLEPHRINE HCL-NACL 10-0.9 MG/250ML-% IV SOLN
INTRAVENOUS | Status: DC | PRN
  Administered 2020-11-21: 30 ug/min via INTRAVENOUS

## 2020-11-21 MED ORDER — LACTATED RINGERS IV SOLN: INTRAVENOUS | Status: DC | PRN

## 2020-11-21 MED ORDER — INSULIN ASPART 100 UNIT/ML IJ SOLN: 0.0000 [IU] | Freq: Three times a day (TID) | INTRAMUSCULAR | Status: DC

## 2020-11-21 MED ORDER — NOREPINEPHRINE 4 MG/250ML-% IV SOLN
2.0000 ug/min | INTRAVENOUS | Status: DC
  Administered 2020-11-21: 10 ug/min via INTRAVENOUS
  Filled 2020-11-21: qty 250

## 2020-11-21 MED ORDER — ACETAMINOPHEN 325 MG PO TABS: 650.0000 mg | ORAL_TABLET | ORAL | Status: DC | PRN

## 2020-11-21 MED ORDER — SODIUM CHLORIDE 0.9% FLUSH
10.0000 mL | Freq: Two times a day (BID) | INTRAVENOUS | Status: DC
  Administered 2020-11-22 – 2020-11-23 (×2): 30 mL
  Administered 2020-11-23 – 2020-11-24 (×2): 10 mL
  Administered 2020-11-24: 20 mL
  Administered 2020-11-25 – 2020-11-28 (×7): 10 mL

## 2020-11-21 MED ORDER — ALBUTEROL SULFATE HFA 108 (90 BASE) MCG/ACT IN AERS
INHALATION_SPRAY | RESPIRATORY_TRACT | Status: DC | PRN
  Administered 2020-11-21: 2 via RESPIRATORY_TRACT

## 2020-11-21 MED ORDER — MIDAZOLAM HCL 2 MG/2ML IJ SOLN
2.0000 mg | Freq: Once | INTRAMUSCULAR | Status: AC
  Administered 2020-11-21: 2 mg via INTRAVENOUS

## 2020-11-21 MED ORDER — FENTANYL 2500MCG IN NS 250ML (10MCG/ML) PREMIX INFUSION
50.0000 ug/h | INTRAVENOUS | Status: DC
  Administered 2020-11-21: 100 ug/h via INTRAVENOUS
  Administered 2020-11-22 – 2020-11-23 (×3): 175 ug/h via INTRAVENOUS
  Filled 2020-11-21 (×4): qty 250

## 2020-11-21 MED ORDER — CHLORHEXIDINE GLUCONATE 0.12% ORAL RINSE (MEDLINE KIT): 15.0000 mL | Freq: Two times a day (BID) | OROMUCOSAL | Status: DC

## 2020-11-21 MED ORDER — DEXMEDETOMIDINE HCL IN NACL 200 MCG/50ML IV SOLN
0.0000 ug/kg/h | INTRAVENOUS | Status: DC
  Filled 2020-11-21: qty 50

## 2020-11-21 MED ORDER — NOREPINEPHRINE 4 MG/250ML-% IV SOLN
INTRAVENOUS | Status: AC
  Filled 2020-11-21: qty 250

## 2020-11-21 MED ORDER — LIDOCAINE 2% (20 MG/ML) 5 ML SYRINGE
INTRAMUSCULAR | Status: DC | PRN
  Administered 2020-11-21: 100 mg via INTRAVENOUS

## 2020-11-21 MED ORDER — GADOBUTROL 1 MMOL/ML IV SOLN
10.0000 mL | Freq: Once | INTRAVENOUS | Status: AC | PRN
  Administered 2020-11-21: 10 mL via INTRAVENOUS

## 2020-11-21 MED ORDER — ORAL CARE MOUTH RINSE
15.0000 mL | OROMUCOSAL | Status: DC
  Administered 2020-11-21 – 2020-11-23 (×22): 15 mL via OROMUCOSAL

## 2020-11-21 MED ORDER — ROSUVASTATIN CALCIUM 20 MG PO TABS
20.0000 mg | ORAL_TABLET | Freq: Every day | ORAL | Status: DC
  Administered 2020-11-22 – 2020-11-23 (×2): 20 mg
  Filled 2020-11-21 (×2): qty 1

## 2020-11-21 MED ORDER — PANTOPRAZOLE SODIUM 40 MG IV SOLR
40.0000 mg | Freq: Every day | INTRAVENOUS | Status: DC
  Administered 2020-11-21 – 2020-11-23 (×3): 40 mg via INTRAVENOUS
  Filled 2020-11-21 (×3): qty 40

## 2020-11-21 MED ORDER — CARVEDILOL 3.125 MG PO TABS: 3.1250 mg | ORAL_TABLET | Freq: Two times a day (BID) | ORAL | Status: DC

## 2020-11-21 MED ORDER — FENTANYL BOLUS VIA INFUSION
50.0000 ug | INTRAVENOUS | Status: DC | PRN
  Administered 2020-11-21 – 2020-11-23 (×7): 100 ug via INTRAVENOUS
  Filled 2020-11-21: qty 100

## 2020-11-21 MED ORDER — MIDAZOLAM HCL 5 MG/5ML IJ SOLN
INTRAMUSCULAR | Status: DC | PRN
  Administered 2020-11-21: 2 mg via INTRAVENOUS

## 2020-11-21 MED ORDER — VASOPRESSIN 20 UNIT/ML IV SOLN
INTRAVENOUS | Status: DC | PRN
  Administered 2020-11-21 (×2): 1 [IU] via INTRAVENOUS

## 2020-11-21 MED ORDER — INSULIN ASPART 100 UNIT/ML IJ SOLN
0.0000 [IU] | INTRAMUSCULAR | Status: DC
  Administered 2020-11-21: 7 [IU] via SUBCUTANEOUS
  Administered 2020-11-21: 4 [IU] via SUBCUTANEOUS
  Administered 2020-11-22 – 2020-11-23 (×8): 11 [IU] via SUBCUTANEOUS
  Administered 2020-11-23: 20 [IU] via SUBCUTANEOUS
  Administered 2020-11-23: 4 [IU] via SUBCUTANEOUS
  Administered 2020-11-23 (×3): 11 [IU] via SUBCUTANEOUS
  Administered 2020-11-24: 3 [IU] via SUBCUTANEOUS

## 2020-11-21 MED ORDER — INSULIN ASPART 100 UNIT/ML IJ SOLN: 0.0000 [IU] | Freq: Every day | INTRAMUSCULAR | Status: DC

## 2020-11-21 MED ORDER — PROPOFOL 1000 MG/100ML IV EMUL
0.0000 ug/kg/min | INTRAVENOUS | Status: DC
  Administered 2020-11-21 (×2): 25 ug/kg/min via INTRAVENOUS
  Administered 2020-11-21: 20 ug/kg/min via INTRAVENOUS
  Administered 2020-11-22: 30 ug/kg/min via INTRAVENOUS
  Administered 2020-11-22 (×2): 50 ug/kg/min via INTRAVENOUS
  Administered 2020-11-22 (×3): 40 ug/kg/min via INTRAVENOUS
  Administered 2020-11-22: 50 ug/kg/min via INTRAVENOUS
  Administered 2020-11-22: 40 ug/kg/min via INTRAVENOUS
  Administered 2020-11-22 – 2020-11-23 (×5): 35 ug/kg/min via INTRAVENOUS
  Filled 2020-11-21 (×5): qty 100
  Filled 2020-11-21: qty 200
  Filled 2020-11-21 (×6): qty 100

## 2020-11-21 MED ORDER — CHLORHEXIDINE GLUCONATE CLOTH 2 % EX PADS
6.0000 | MEDICATED_PAD | Freq: Every day | CUTANEOUS | Status: DC
  Administered 2020-11-21 – 2020-11-24 (×5): 6 via TOPICAL

## 2020-11-21 MED ORDER — SACUBITRIL-VALSARTAN 49-51 MG PO TABS: 1.0000 | ORAL_TABLET | Freq: Two times a day (BID) | ORAL | Status: DC

## 2020-11-21 MED ORDER — DIGOXIN 125 MCG PO TABS
0.1250 mg | ORAL_TABLET | Freq: Every day | ORAL | Status: DC
  Administered 2020-11-22 – 2020-11-23 (×2): 0.125 mg
  Filled 2020-11-21 (×2): qty 1

## 2020-11-21 MED ORDER — INSULIN GLARGINE 100 UNIT/ML ~~LOC~~ SOLN
10.0000 [IU] | Freq: Two times a day (BID) | SUBCUTANEOUS | Status: DC
  Administered 2020-11-21: 10 [IU] via SUBCUTANEOUS
  Filled 2020-11-21 (×3): qty 0.1

## 2020-11-21 MED ORDER — SODIUM CHLORIDE 0.9% FLUSH: 10.0000 mL | INTRAVENOUS | Status: DC | PRN

## 2020-11-21 MED ORDER — FENTANYL CITRATE (PF) 100 MCG/2ML IJ SOLN
50.0000 ug | Freq: Once | INTRAMUSCULAR | Status: AC
  Administered 2020-11-21: 50 ug via INTRAVENOUS
  Filled 2020-11-21: qty 2

## 2020-11-21 MED ORDER — ROCURONIUM BROMIDE 10 MG/ML (PF) SYRINGE
PREFILLED_SYRINGE | INTRAVENOUS | Status: DC | PRN
  Administered 2020-11-21: 40 mg via INTRAVENOUS

## 2020-11-21 MED ORDER — MIDAZOLAM HCL 2 MG/2ML IJ SOLN
2.0000 mg | Freq: Once | INTRAMUSCULAR | Status: AC
  Administered 2020-11-21: 2 mg via INTRAVENOUS
  Filled 2020-11-21: qty 2

## 2020-11-21 MED ORDER — INSULIN ASPART 100 UNIT/ML IJ SOLN: 3.0000 [IU] | Freq: Three times a day (TID) | INTRAMUSCULAR | Status: DC

## 2020-11-21 MED ORDER — DOCUSATE SODIUM 50 MG/5ML PO LIQD
100.0000 mg | Freq: Two times a day (BID) | ORAL | Status: DC
  Administered 2020-11-21 – 2020-11-22 (×2): 100 mg
  Filled 2020-11-21 (×3): qty 10

## 2020-11-21 MED ORDER — ASPIRIN 81 MG PO CHEW: 81.0000 mg | CHEWABLE_TABLET | Freq: Every day | ORAL | Status: DC

## 2020-11-21 SURGICAL SUPPLY — 35 items
ADAPTER VALVE BIOPSY EBUS (MISCELLANEOUS) IMPLANT
ADPTR VALVE BIOPSY EBUS (MISCELLANEOUS)
BRUSH CYTOL CELLEBRITY 1.5X140 (MISCELLANEOUS) IMPLANT
CANISTER SUCT 3000ML PPV (MISCELLANEOUS) ×3 IMPLANT
CONT SPEC 4OZ CLIKSEAL STRL BL (MISCELLANEOUS) ×3 IMPLANT
COVER BACK TABLE 60X90IN (DRAPES) ×3 IMPLANT
FORCEPS BIOP RJ4 1.8 (CUTTING FORCEPS) IMPLANT
GAUZE SPONGE 4X4 12PLY STRL (GAUZE/BANDAGES/DRESSINGS) ×3 IMPLANT
GLOVE BIO SURGEON STRL SZ7.5 (GLOVE) ×3 IMPLANT
GOWN STRL REUS W/ TWL LRG LVL3 (GOWN DISPOSABLE) ×2 IMPLANT
GOWN STRL REUS W/ TWL XL LVL3 (GOWN DISPOSABLE) ×2 IMPLANT
GOWN STRL REUS W/TWL LRG LVL3 (GOWN DISPOSABLE) ×3
GOWN STRL REUS W/TWL XL LVL3 (GOWN DISPOSABLE) ×3
KIT CLEAN ENDO COMPLIANCE (KITS) ×6 IMPLANT
KIT TURNOVER KIT B (KITS) ×3 IMPLANT
MARKER SKIN DUAL TIP RULER LAB (MISCELLANEOUS) ×3 IMPLANT
NDL ASPIRATION VIZISHOT 19G (NEEDLE) IMPLANT
NDL ASPIRATION VIZISHOT 21G (NEEDLE) IMPLANT
NEEDLE ASPIRATION VIZISHOT 19G (NEEDLE) IMPLANT
NEEDLE ASPIRATION VIZISHOT 21G (NEEDLE) IMPLANT
NS IRRIG 1000ML POUR BTL (IV SOLUTION) ×3 IMPLANT
OIL SILICONE PENTAX (PARTS (SERVICE/REPAIRS)) ×3 IMPLANT
PAD ARMBOARD 7.5X6 YLW CONV (MISCELLANEOUS) ×6 IMPLANT
SYR 20ML ECCENTRIC (SYRINGE) ×6 IMPLANT
SYR 20ML LL LF (SYRINGE) ×6 IMPLANT
SYR 50ML SLIP (SYRINGE) IMPLANT
SYR 5ML LUER SLIP (SYRINGE) ×3 IMPLANT
TOWEL GREEN STERILE FF (TOWEL DISPOSABLE) ×3 IMPLANT
TRAP SPECIMEN MUCOUS 40CC (MISCELLANEOUS) IMPLANT
TUBE CONNECTING 20X1/4 (TUBING) ×6 IMPLANT
UNDERPAD 30X30 (UNDERPADS AND DIAPERS) ×3 IMPLANT
VALVE BIOPSY  SINGLE USE (MISCELLANEOUS) ×3
VALVE BIOPSY SINGLE USE (MISCELLANEOUS) ×2 IMPLANT
VALVE SUCTION BRONCHIO DISP (MISCELLANEOUS) ×3 IMPLANT
WATER STERILE IRR 1000ML POUR (IV SOLUTION) ×3 IMPLANT

## 2020-11-21 NOTE — Progress Notes (Signed)
eLink Physician-Brief Progress Note Patient Name: Marc Wright DOB: 04/26/1976 MRN: [id number]   Date of Service  11/21/2020  HPI/Events of Note  Patient appeared to have a rhythm change on the monitor so the bedside RN obtained an EKG.  eICU Interventions  Very poor quality EKG but computer interpretation states sinus rhythm with 1st degree A-V block, it also comments on possible misplaced recording leads, patients blood pressure has been stable and RN was even able to titrate down Levophed gtt. ABG improved with increased respiratory rate. Will cycle Troponin and continue to observe patient closely.        Thomasene Lot Marc Wright 11/21/2020, 11:57 PM

## 2020-11-21 NOTE — Anesthesia Procedure Notes (Signed)
Arterial Line Insertion Start/End6/06/2020 2:20 PM, 11/21/2020 2:33 PM Performed by: anesthesiologist  Patient location: OR. Preanesthetic checklist: patient identified, IV checked, site marked, risks and benefits discussed, surgical consent, monitors and equipment checked, pre-op evaluation, timeout performed and anesthesia consent Lidocaine 1% used for infiltration Left, radial was placed Catheter size: 20 Fr Hand hygiene performed  and maximum sterile barriers used   Attempts: 4 Procedure performed without using ultrasound guided technique. Following insertion, dressing applied. Post procedure assessment: normal and unchanged  Patient tolerated the procedure well with no immediate complications. Additional procedure comments: Multiple attempts, very poor pulse.

## 2020-11-21 NOTE — Anesthesia Procedure Notes (Addendum)
Procedure Name: Intubation Date/Time: 11/21/2020 1:47 PM Performed by: Macie Burows, CRNA Pre-anesthesia Checklist: Patient identified, Emergency Drugs available, Suction available and Patient being monitored Patient Re-evaluated:Patient Re-evaluated prior to induction Oxygen Delivery Method: Circle system utilized Preoxygenation: Pre-oxygenation with 100% oxygen Induction Type: IV induction Ventilation: Oral airway inserted - appropriate to patient size and Two handed mask ventilation required Laryngoscope Size: Glidescope and 4 Grade View: Grade II Tube type: Oral Number of attempts: 1 Airway Equipment and Method: Video-laryngoscopy and Rigid stylet Placement Confirmation: ETT inserted through vocal cords under direct vision,  positive ETCO2 and breath sounds checked- equal and bilateral Secured at: 26 cm Tube secured with: Tape Dental Injury: Teeth and Oropharynx as per pre-operative assessment

## 2020-11-21 NOTE — Progress Notes (Signed)
[facility] PROGRESS NOTE    Progress Note  Marc Maesaul A Lascola  WUJ:[id number] DOB: 12/20/1975 DOA: 11/15/2020 PCP: Patient, No Pcp Per (Inactive)     Brief Narrative:   Marc Wright is an 45 y.o. male past medical history of chronic systolic heart failure with an EF of 25% concern for cardiac sarcoid, essential hypertension obstructive sleep apnea, diabetes mellitus type 2 to the ED for sore throat self treating herself with Lasix at home CT chest was done that showed multifocal consolidation.   Assessment/Plan:   Acute respiratory failure with hypoxia secondarily to acute systolic heart failure/elevated troponins: Currently satting greater 97% on room air. Negative about 14 L with IV diuresis. Left heart cath on 11/21/2018 compatible with ischemic versus nonischemic cardiomyopathy. Echocardiogram showed an unusual akinesis pattern concerning for infiltrative disease. MRI ordered by cardiology to rule out cardiac sarcoid. Currently on Entresto, Aldactone Coreg and digoxin, Lasix and Diamox Further management per advanced heart failure team.  Pulmonary sarcoid with mediastinal lymphadenopathy: For bronc on 11/22/2018 for biopsy of lymphadenopathy. Right heart cath normal wedge pressure. Pending test QuantiFERON gold, ANA, rheumatoid factor, CCP, GMB, ANCA panel and scleroderma. Aspirin to be started postprocedural.  Essential hypertension: In setting of noncompliance, CAD systolic heart failure medications detail.  Uncontrolled diabetes mellitus type 2: With an A1c of 11.5.  We will go ahead and start her on long-acting insulin per sliding scale.  Morbid obesity: Counseling.  Obstructive sleep apnea: Will need outpatient study, will see we get started on CPAP in house.   DVT prophylaxis: lovenox Family Communication:none Status is: Inpatient  Remains inpatient appropriate because:Hemodynamically unstable   Dispo: The patient is from: Home              Anticipated d/c  is to: Home              Patient currently is not medically stable to d/c.   Difficult to place patient No  Code Status:     Code Status Orders  (From admission, onward)         Start     Ordered   11/16/20 0123  Full code  Continuous        11/16/20 0123        Code Status History    Date Active Date Inactive Code Status Order ID Comments User Context   11/07/2018 0216 11/11/2018 1532 Full Code [id number]  Therisa Doyneoutova, Anastassia, MD Inpatient   06/25/2016 2104 07/01/2016 1920 Full Code [id number]  Altamese DillingVachhani, Vaibhavkumar, MD Inpatient   Advance Care Planning Activity        IV Access:    Peripheral IV   Procedures and diagnostic studies:   CARDIAC CATHETERIZATION  Result Date: 11/20/2020  Prox RCA lesion is 100% stenosed.  Prox LAD lesion is 50% stenosed.  1. Normal filling pressures. 2. Low cardiac output.  Mildly decreased by thermodilution which is likely more accurate as oxygen saturation was up and down during case so unsure of Fick accuracy. 3. Totally occluded RCA is likely chronic.  There are R=>R collaterals, not L=>R collaterals.  50% stenosis proximal LAD. Suspect mixed ischemic/nonischemic cardiomyopathy.   CT Chest High Resolution  Result Date: 11/19/2020 CLINICAL DATA:  Persistent infiltrates on CT, evaluate round atelectasis versus true infiltrate EXAM: CT CHEST WITHOUT CONTRAST TECHNIQUE: Multidetector CT imaging of the chest was performed following the standard protocol without intravenous contrast. High resolution imaging of the lungs, as well as inspiratory and expiratory imaging, was performed. COMPARISON:  11/15/2020, 11/06/2018  FINDINGS: Cardiovascular: Cardiomegaly. Three-vessel coronary artery calcifications. No pericardial effusion. Mediastinum/Nodes: Numerous enlarged mediastinal and bilateral hilar lymph nodes are unchanged, largest pretracheal nodes measuring 2.5 x 1.9 cm (series 11, image 50). Thyroid gland, trachea, and esophagus demonstrate no  significant findings. Lungs/Pleura: There are multiple dense bilateral consolidations, generally with spiculated margins and internal air bronchograms. The largest and most dense consolidations are in the bilateral superior segments of the lower lobes (series 12, image 82). These are unchanged compared to recent CT dated 11/15/2020 but are slightly increased in size and density in comparison to the prior dated 11/06/2018. There may be some very fine associated nodularity and there is a background of fine centrilobular nodularity. No significant air trapping on expiratory phase imaging. No pleural effusion or pneumothorax. Upper Abdomen: No acute abnormality. Musculoskeletal: No chest wall mass or suspicious bone lesions identified. IMPRESSION: 1. There are multiple dense bilateral consolidations, generally with spiculated margins and internal air bronchograms. The largest and most dense consolidations are in the bilateral superior segments of the lower lobes. These are unchanged compared to recent CT dated 11/15/2020 are slightly increased in size and density in comparison to the prior dated 11/06/2018. There may be some very fine associated nodularity. This unusual constellation of findings, in combination with enlarged mediastinal and hilar lymph nodes, suggests pulmonary sarcoidosis with progressive massive fibrosis. 2. Numerous enlarged mediastinal and bilateral hilar lymph nodes, unchanged, nonspecific, although as above, may reflect nodal sarcoidosis. 3. Cardiomegaly and coronary artery disease. Electronically Signed   By: Lauralyn Primes M.D.   On: 11/19/2020 17:21     Medical Consultants:    None.   Subjective:    Marc Maes he has no new complaints relates his breathing is slightly better  Objective:    Vitals:   11/20/20 2354 11/21/20 0000 11/21/20 0345 11/21/20 0900  BP: 113/70  112/73 97/65  Pulse: 89  83   Resp:   19 18  Temp: 98.4 F (36.9 C)  97.9 F (36.6 C)   TempSrc: Oral   Oral   SpO2: 96%  98% 97%  Weight:  (!) 161.2 kg    Height:       SpO2: 97 % O2 Flow Rate (L/min): 2 L/min   Intake/Output Summary (Last 24 hours) at 11/21/2020 1131 Last data filed at 11/21/2020 0350 Gross per 24 hour  Intake 1327 ml  Output 2125 ml  Net -798 ml   Filed Weights   11/19/20 0037 11/20/20 0505 11/21/20 0000  Weight: (!) 165.7 kg (!) 161.5 kg (!) 161.2 kg    Exam: General exam: In no acute distress, morbidly obese Respiratory system: Good air movement and crackles at bases. Cardiovascular system: S1 & S2 heard, RRR. No JVD.  Gastrointestinal system: Abdomen is nondistended, soft and nontender.  Extremities: No pedal edema. Skin: Cyanotic toes Psychiatry: Judgement and insight appear normal. Mood & affect appropriate.    Data Reviewed:    Labs: Basic Metabolic Panel: Recent Labs  Lab 11/16/20 0009 11/16/20 0421 11/17/20 0155 11/18/20 0102 11/19/20 7253 11/20/20 0254 11/20/20 0841 11/20/20 0842 11/21/20 0556  NA 137 134* 138 137 135 134* 142 140 136  K 3.8 4.7 3.8 3.9 3.5 4.6 3.7 4.0 3.9  CL 97* 100 95* 95* 96* 99  --   --  103  CO2 30 27 34* 36* 32 25  --   --  23  GLUCOSE 242* 411* 156* 124* 191* 200*  --   --  153*  BUN 14 15  17 15 17 19   --   --  17  CREATININE 0.66 0.72 0.85 0.77 0.82 0.82  --   --  0.75  CALCIUM 8.8* 8.8* 9.1 9.0 9.1 9.2  --   --  9.3  MG 1.7 1.7 1.7  --   --  2.0  --   --   --   PHOS  --  3.7  --   --   --   --   --   --   --    GFR Estimated Creatinine Clearance: 186 mL/min (by C-G formula based on SCr of 0.75 mg/dL). Liver Function Tests: Recent Labs  Lab 11/17/20 0155 11/18/20 0625 11/19/20 0224 11/20/20 0254 11/21/20 0556  AST 17 16 17  32 27  ALT 29 28 27 31  42  ALKPHOS 99 94 99 95 96  BILITOT 0.7 0.9 0.8 1.0 0.6  PROT 6.2* 6.7 6.4* 6.6 6.5  ALBUMIN 3.1* 3.3* 3.2* 3.3* 3.3*   No results for input(s): LIPASE, AMYLASE in the last 168 hours. No results for input(s): AMMONIA in the last 168 hours. Coagulation  profile No results for input(s): INR, PROTIME in the last 168 hours. COVID-19 Labs  No results for input(s): DDIMER, FERRITIN, LDH, CRP in the last 72 hours.  Lab Results  Component Value Date   SARSCOV2NAA NEGATIVE 11/15/2020   SARSCOV2NAA NOT DETECTED 11/07/2018   SARSCOV2NAA NEGATIVE 11/07/2018   SARSCOV2NAA NEGATIVE 11/06/2018    CBC: Recent Labs  Lab 11/16/20 0421 11/18/20 5056 11/19/20 0224 11/20/20 0254 11/20/20 0841 11/20/20 0842 11/21/20 0556  WBC 9.6 7.5 6.2 7.3  --   --  6.1  NEUTROABS 8.7* 4.4 3.6 3.9  --   --  3.7  HGB 17.1* 17.6* 17.2* 18.1* 19.0* 19.4* 19.4*  HCT 51.7 54.6* 54.2* 55.9* 56.0* 57.0* 58.5*  MCV 85.5 86.5 87.0 86.4  --   --  85.5  PLT 226 260 256 281  --   --  231   Cardiac Enzymes: Recent Labs  Lab 11/16/20 0009 11/16/20 0421  CKTOTAL 38* 38*   BNP (last 3 results) No results for input(s): PROBNP in the last 8760 hours. CBG: Recent Labs  Lab 11/20/20 2106 11/20/20 2350 11/21/20 0347 11/21/20 0651 11/21/20 1110  GLUCAP 189* 226* 167* 127* 189*   D-Dimer: No results for input(s): DDIMER in the last 72 hours. Hgb A1c: No results for input(s): HGBA1C in the last 72 hours. Lipid Profile: No results for input(s): CHOL, HDL, LDLCALC, TRIG, CHOLHDL, LDLDIRECT in the last 72 hours. Thyroid function studies: No results for input(s): TSH, T4TOTAL, T3FREE, THYROIDAB in the last 72 hours.  Invalid input(s): FREET3 Anemia work up: No results for input(s): VITAMINB12, FOLATE, FERRITIN, TIBC, IRON, RETICCTPCT in the last 72 hours. Sepsis Labs: Recent Labs  Lab 11/15/20 1632 11/16/20 0008 11/16/20 0009 11/16/20 0421 11/18/20 9794 11/19/20 0224 11/20/20 0254 11/21/20 0556  PROCALCITON  --   --  <0.10 <0.10  --   --   --   --   WBC  --   --   --  9.6 7.5 6.2 7.3 6.1  LATICACIDVEN 1.4 1.3  --  1.4  --   --   --   --    Microbiology Recent Results (from the past 240 hour(s))  Resp Panel by RT-PCR (Flu A&B, Covid) Nasopharyngeal  Swab     Status: None   Collection Time: 11/15/20  3:57 PM   Specimen: Nasopharyngeal Swab; Nasopharyngeal(NP) swabs in vial transport medium  Result  Value Ref Range Status   SARS Coronavirus 2 by RT PCR NEGATIVE NEGATIVE Final    Comment: (NOTE) SARS-CoV-2 target nucleic acids are NOT DETECTED.  The SARS-CoV-2 RNA is generally detectable in upper respiratory specimens during the acute phase of infection. The lowest concentration of SARS-CoV-2 viral copies this assay can detect is 138 copies/mL. A negative result does not preclude SARS-Cov-2 infection and should not be used as the sole basis for treatment or other patient management decisions. A negative result may occur with  improper specimen collection/handling, submission of specimen other than nasopharyngeal swab, presence of viral mutation(s) within the areas targeted by this assay, and inadequate number of viral copies(<138 copies/mL). A negative result must be combined with clinical observations, patient history, and epidemiological information. The expected result is Negative.  Fact Sheet for Patients:  BloggerCourse.com  Fact Sheet for Healthcare Providers:  SeriousBroker.it  This test is no t yet approved or cleared by the [place] FDA and  has been authorized for detection and/or diagnosis of SARS-CoV-2 by FDA under an Emergency Use Authorization (EUA). This EUA will remain  in effect (meaning this test can be used) for the duration of the COVID-19 declaration under Section 564(b)(1) of the Act, 21 U.S.C.section 360bbb-3(b)(1), unless the authorization is terminated  or revoked sooner.       Influenza A by PCR NEGATIVE NEGATIVE Final   Influenza B by PCR NEGATIVE NEGATIVE Final    Comment: (NOTE) The Xpert Xpress SARS-CoV-2/FLU/RSV plus assay is intended as an aid in the diagnosis of influenza from Nasopharyngeal swab specimens and should not be used as a sole  basis for treatment. Nasal washings and aspirates are unacceptable for Xpert Xpress SARS-CoV-2/FLU/RSV testing.  Fact Sheet for Patients: BloggerCourse.com  Fact Sheet for Healthcare Providers: SeriousBroker.it  This test is not yet approved or cleared by the [place] FDA and has been authorized for detection and/or diagnosis of SARS-CoV-2 by FDA under an Emergency Use Authorization (EUA). This EUA will remain in effect (meaning this test can be used) for the duration of the COVID-19 declaration under Section 564(b)(1) of the Act, 21 U.S.C. section 360bbb-3(b)(1), unless the authorization is terminated or revoked.  Performed at [facility], [address] [place], [place] [address]   Expectorated Sputum Assessment w Gram Stain, Rflx to Resp Cult     Status: None   Collection Time: 11/16/20 12:43 AM   Specimen: Expectorated Sputum  Result Value Ref Range Status   Specimen Description EXPECTORATED SPUTUM  Final   Special Requests SPUTUM  Final   Sputum evaluation   Final    THIS SPECIMEN IS ACCEPTABLE FOR SPUTUM CULTURE Performed at [facility], [address] [place], [place] [address]    Report Status 11/16/2020 FINAL  Final  Culture, Respiratory w Gram Stain     Status: None   Collection Time: 11/16/20 12:43 AM  Result Value Ref Range Status   Specimen Description EXPECTORATED SPUTUM  Final   Special Requests SPUTUM Reflexed from [id number]  Final   Gram Stain   Final    MODERATE WBC PRESENT, PREDOMINANTLY PMN NO ORGANISMS SEEN    Culture   Final    RARE Normal respiratory flora-no Staph aureus or Pseudomonas seen Performed at [facility], [address], [place], [place] [address]    Report Status 11/18/2020 FINAL  Final  Culture, blood (routine x 2) Call MD if unable to obtain prior to antibiotics being given  Status: None   Collection Time: 11/16/20  4:15 AM   Specimen: BLOOD  LEFT HAND  Result Value Ref Range Status   Specimen Description BLOOD LEFT HAND  Final   Special Requests   Final    BOTTLES DRAWN AEROBIC ONLY Blood Culture adequate volume   Culture   Final    NO GROWTH 5 DAYS Performed at [facility], [address], [place], [place] [address]    Report Status 11/21/2020 FINAL  Final  Culture, blood (routine x 2) Call MD if unable to obtain prior to antibiotics being given     Status: None   Collection Time: 11/16/20  4:15 AM   Specimen: BLOOD RIGHT HAND  Result Value Ref Range Status   Specimen Description BLOOD RIGHT HAND  Final   Special Requests   Final    BOTTLES DRAWN AEROBIC ONLY Blood Culture adequate volume   Culture   Final    NO GROWTH 5 DAYS Performed at [facility], [address], [place], [place] [address]    Report Status 11/21/2020 FINAL  Final     Medications:   . aspirin EC  81 mg Oral Daily  . carvedilol  3.125 mg Oral BID WC  . digoxin  0.125 mg Oral Daily  . insulin aspart  0-15 Units Subcutaneous Q4H  . nicotine  21 mg Transdermal Daily  . rosuvastatin  20 mg Oral Daily  . sacubitril-valsartan  1 tablet Oral BID  . sodium chloride flush  3 mL Intravenous Q12H  . sodium chloride flush  3 mL Intravenous Q12H  . sodium chloride flush  3 mL Intravenous Q12H  . spironolactone  25 mg Oral Daily   Continuous Infusions: . sodium chloride    . sodium chloride        LOS: 6 days   Marinda Elk  [facility]  11/21/2020, 11:31 AM

## 2020-11-21 NOTE — Progress Notes (Signed)
In MRI, will see him in pre-op. No changes to plans for bronch/EBUS/Tbbx r/o sarcoid.

## 2020-11-21 NOTE — Progress Notes (Signed)
eLink Physician-Brief Progress Note Patient Name: Marc Wright DOB: [birth year] MRN: [id number]   Date of Service  11/21/2020  HPI/Events of Note  Patient now has a PICC line, bedside RN asking for Norepinephrine gtt to be switched from peripheral protocol to full dose.  eICU Interventions  Norepinephrine gtt changed to full dosing protocol.        Thomasene Lot Delwyn Scoggin 11/21/2020, 11:05 PM

## 2020-11-21 NOTE — Anesthesia Postprocedure Evaluation (Signed)
Anesthesia Post Note  Patient: Kennith Maes  Procedure(s) Performed: VIDEO BRONCHOSCOPY WITH ENDOBRONCHIAL ULTRASOUND (N/A ) BRONCHIAL BIOPSIES BRONCHIAL NEEDLE ASPIRATION BIOPSIES     Patient location during evaluation: ICU Anesthesia Type: General Level of consciousness: patient remains intubated per anesthesia plan and sedated Pain management: pain level controlled Vital Signs Assessment: post-procedure vital signs reviewed and stable Respiratory status: patient remains intubated per anesthesia plan Cardiovascular status: blood pressure returned to baseline and stable Postop Assessment: no apparent nausea or vomiting Anesthetic complications: no Comments: Difficult mask ventilation, poor heart function, d/w Dr. Katrinka Blazing who agreed with transferring to ICU so that they can extubate onto BiPAP. No issue w/ transfer, full report to bedside team.   No complications documented.  Last Vitals:  Vitals:   11/21/20 1228 11/21/20 1455  BP: 127/88 123/90  Pulse: 87 89  Resp: 16 19  Temp: 36.5 C   SpO2: 98% 95%    Last Pain:  Vitals:   11/21/20 1228  TempSrc: Oral  PainSc: 4                  Lannie Fields

## 2020-11-21 NOTE — Procedures (Signed)
Flexible and EBUS Bronchoscopy Procedure Note  Marc Wright  [id number]  March 29, 1976  Date:11/21/20  Time:2:55 PM   Provider Performing:Caris Cerveny C Tamala Julian   Procedure: Flexible bronchoscopy, transbronchial biopsy, and EBUS Bronchoscopy  Indication(s) Abnormal CT  Consent Risks of the procedure as well as the alternatives and risks of each were explained to the patient and/or caregiver.  Consent for the procedure was obtained.  Anesthesia General Anesthesia   Time Out Verified patient identification, verified procedure, site/side was marked, verified correct patient position, special equipment/implants available, medications/allergies/relevant history reviewed, required imaging and test results available.   Sterile Technique Usual hand hygiene, masks, gowns, and gloves were used   Procedure Description Diagnostic bronchoscope advanced through endotracheal tube and into airway.  Airways were examined down to subsegmental level with findings noted below.  Following diagnostic evaluation, transbronchial biopsies performed under fluoroscopic guidance (total fluoro time 27s) x 4 in RLL.  The diagnostic bronchoscope was then removed and the EBUS bronchoscope was advanced into airway with station 7 biopsied x 6 and sent for slide, cell block, and/or culture.  The EBUS bronchoscope was removed after assuring no active bleeding from biopsy site.  Findings:  - Multiple enlarged rubbery lymph nodes throughout mediastinum and hila - Small thick secretions throughout   Complications/Tolerance Did not tolerate induction and anesthesia: bronchospastic and in shock so going to ICU Chest X-ray is needed post procedure.   EBL Minimal   Specimen(s) RLL infiltrate Tbbx Station 7 EBUS FNA

## 2020-11-21 NOTE — Progress Notes (Signed)
Peripherally Inserted Central Catheter Placement  The IV Nurse has discussed with the patient and/or persons authorized to consent for the patient, the purpose of this procedure and the potential benefits and risks involved with this procedure.  The benefits include less needle sticks, lab draws from the catheter, and the patient may be discharged home with the catheter. Risks include, but not limited to, infection, bleeding, blood clot (thrombus formation), and puncture of an artery; nerve damage and irregular heartbeat and possibility to perform a PICC exchange if needed/ordered by physician.  Alternatives to this procedure were also discussed.  Bard Power PICC patient education guide, fact sheet on infection prevention and patient information card has been provided to patient /or left at bedside.  Telephone consent obtained from brother, Rosanne Ashing. Witnessed with Dulcy Fanny, RN.   PICC Placement Documentation  PICC Triple Lumen 11/21/20 PICC Right Basilic 48 cm 1 cm (Active)  Indication for Insertion or Continuance of Line Vasoactive infusions 11/21/20 2149  Exposed Catheter (cm) 1 cm 11/21/20 2149  Site Assessment Clean;Dry;Intact 11/21/20 2149  Lumen #1 Status Capped (Central line);Flushed;Blood return noted 11/21/20 2149  Lumen #2 Status Capped (Central line);Flushed;Blood return noted 11/21/20 2149  Lumen #3 Status Capped (Central line);Flushed;Blood return noted 11/21/20 2149  Dressing Type Transparent;Securing device 11/21/20 2149  Dressing Status Clean;Dry;Intact 11/21/20 2149  Antimicrobial disc in place? Yes 11/21/20 2149  Safety Lock Not Applicable 11/21/20 2149  Line Care Connections checked and tightened 11/21/20 2149  Line Adjustment (NICU/IV Team Only) No 11/21/20 2149  Dressing Intervention New dressing 11/21/20 2149       Burnard Bunting Chenice 11/21/2020, 9:50 PM

## 2020-11-21 NOTE — Progress Notes (Addendum)
NAME:  Marc Wright, MRN:  [id number], DOB:  1975/11/02, LOS: 6 ADMISSION DATE:  11/15/2020, CONSULTATION DATE:  11/21/20 REFERRING MD:  TRH CHIEF COMPLAINT: Shortness of breath  BRIEF  45 year old whom we are seeing in consultation for evaluation of abnormal chest imaging hypoxemia for concern of/ rule out pulmonary / cardiac sarcoidosis   Patient is 1 week history of worsening dyspnea on exertion.  Associated with increased nasal congestion.  Developed shortness of breath at rest.  Shortness of breath worse when lying down, improves when sitting up.  Noted increasing lower extremity swelling more so on the left leg than the right over the same timeframe.  Denies respiratory issues with pollen earlier in the spring.  Denies any wheezing.  Worsened over time which prompted presentation to ED.  There BNP elevated.  Chest x-ray congested.  CT scan with bilateral) right greater than left pleural effusions with mild interlobular septal thickening.  Had several episodes of infiltrate most notably in bilateral lower lobes.  Reviewed CT scan 2020 monitor patient with similar appearing infiltrates although more prominent this admission.  No fever or chills.  Does have mild cough, sounds almost blood-tinged.  Reviewed labs extensively no leukocytosis, no fever here, procalcitonin negative x2.  Received Lasix.  He is feeling better.  Weaning oxygen.  Notes he desats when he sleeps but has known sleep apnea.    Pertinent  Medical History  Cardiomyopathy, asthma, seasonal allergies  Significant Hospital Events: Including procedures, antibiotic start and stop dates in addition to other pertinent events   . 5/25-10/2024 admitted to hospital, began diuresed, got dose of Solu-Medrol 60 mg IV x1 . 5/27 pulmonary consulted . 5/30 Most respiratory symptoms resolved or markedly improved -     5/31  -s/p R/LHC w/ Dr. Shirlee Latch. ok for patient to undergo bronch/EBUS under [facility] while as inpatient based on cath  results.  Interim History / Subjective:  Went for cMRI this am  S/p tbbx RLL infiltrate/ station 7 EBUS FNA , became bronchospastic and hypotensive requiring vasopressor support with induction/ general anesthesia-> returns to ICU intubated on neo 45 mcg/min and propofol 40 mg/kg/min, left radial aline   Objective   Blood pressure 123/90, pulse 89, temperature 97.7 F (36.5 C), temperature source Oral, resp. rate 19, height 6\' 3"  (1.905 m), weight (!) 161.2 kg, SpO2 95 %.    Vent Mode: PRVC FiO2 (%):  [60 %] 60 % Set Rate:  [18 bmp] 18 bmp Vt Set:  [670 mL] 670 mL PEEP:  [5 cmH20] 5 cmH20 Plateau Pressure:  [25 cmH20] 25 cmH20   Intake/Output Summary (Last 24 hours) at 11/21/2020 1515 Last data filed at 11/21/2020 0350 Gross per 24 hour  Intake 850 ml  Output 1375 ml  Net -525 ml   Filed Weights   11/19/20 0037 11/20/20 0505 11/21/20 0000  Weight: (!) 165.7 kg (!) 161.5 kg (!) 161.2 kg    Examination: General:  Morbidly obese adult male intubated on MV w/ frequent coughing spells HEENT: MM pink/moist, ETT/ OGT, pupils 4/reactive, anicertic Neuro: sedated, not f/c  CV: rr, some PVCs PULM:  MV full support, frequent coughing, moderate amount of bloody secretions, coarse/scattered rhonchi and diffuse exp wheeze, normal PIP/ plat, no air trapping GI: soft, bs+  Extremities: warm/dry, np 1+ LE edema  Skin: no rashes    Labs/imaging that I havepersonally reviewed  (right click and "Reselect all SmartList Selections" daily)  BMET, CBC Hgb 19.4/ Hct 58.5   5/27 TTE LVEF 20-25%,  akinesis of basal to mid spetum and basal to mid inferior wall-> consider infiltrative cardiomyopathy, RV systolic function mildly reduced, LVH, indeterminate DD function  5/30 HRCT >>1. There are multiple dense bilateral consolidations, generally with spiculated margins and internal air bronchograms. The largest and most dense consolidations are in the bilateral superior segments of the lower lobes. These  are unchanged compared to recent CT dated 11/15/2020 are slightly increased in size and density in comparison to the prior dated 11/06/2018. There may be some very fine associated nodularity. This unusual constellation of findings, in combination with enlarged mediastinal and hilar lymph nodes, suggests pulmonary sarcoidosis with progressive massive fibrosis. 2. Numerous enlarged mediastinal and bilateral hilar lymph nodes, unchanged, nonspecific, although as above, may reflect nodal sarcoidosis. 3. Cardiomegaly and coronary artery disease.  5/31 R/LHC >>  Prox RCA lesion is 100% stenosed.  Prox LAD lesion is 50% stenosed.   1. Normal filling pressures.  2. Low cardiac output.  Mildly decreased by thermodilution which is likely more accurate as oxygen saturation was up and down during case so unsure of Fick accuracy.  3. Totally occluded RCA is likely chronic.  There are R=>R collaterals, not L=>R collaterals.  50% stenosis proximal LAD.   Suspect mixed ischemic/nonischemic cardiomyopathy.   6/1 cMRI >> 1. Bilateral airspace disease, see dedicated chest CT from earlier this admission. 2. Severely dilated left ventricle with marked septal-lateral dyssynchrony as well as inferior severe hypokinesis and septal severe hypokinesis. Difficult to quantify EF due to dyssynchrony, but calculated at 18%. 3.  Mildly dilated RV with severe systolic dysfunction, EF 77%. 4. Poor delayed enhancement images with significant artifact.  Probably inferior RV insertion site LGE.  Resolved Hospital Problem list     Assessment & Plan:     Persistent bilateral LL infiltrates with High ACE level + Mediastinal Adenopathy : . Similar albeit slightly more prominent infiltrates compared to 2020.  RHC with normal PCWP. Sarcoid leading ddx esp with systolic cardiomyopathy.  Acute on chronic hypoxic respiratory failure/ insufficiency s/p general anesthesia Bronchospasm  Tobacco abuse OSA- non compliant with  CPAP P:   - Continue MV support, 8cc/kg IBW with goal Pplat <30 and DP<15, current vent pressures within goal  - VAP prevention protocol/ PPI - PAD protocol for sedation> change from propofol -> precedex/ fentanyl for RASS goal  0/-1 -> not sedated on precedex 1.2 / fentanyl 200 mcg/min, high risk for extubation, requiring restraints, apneic on PSV, not f/c, will switch back to propofol/ reduced fentanyl as needed - wean FiO2 as able for SpO2 >92%  - daily SAT & SBT- consider extubation if hemodynamically stable - pending Quant gOld, ANA, RF, CCP, GBM, ANCA panel, scl-70, ssa/ssb - f/u biopsies from EBUS - f/u CXR - duonebs q 4 - start solumedrol 40mg  q 12hrs - some copious bloody trach secretions s/p EBUS-> nebulized TXA 500mg  q 8 x 2 days - once extubated, hs O2 prn, repeat PSG outpatient  Hypotension - ? From sedation/ general anesthesia/ AoC HF - change from Neo to NE for MAP goal > 65 - PICC per IV team given poor access  - continue Aline - no concern for infectious process at this time, continue to monitor clinically   AoC systolic HF Mixed ischemic/ nonischemic cardiomyopathy  CAD Hx HTN- non compliant  HLD First degree heart block - extent of cardiomyopathy not explained by CAD, suspect nonischemic component r/o cardiac sarcoidosis - s/p TTE 5/27, R/LHC 5/31, and cMRI 6/1 as above  P:  -  Tele monitoring - per HF team - s/p lasix 20 mg this am - currently holding coreg, entresto, spironolactone and coreg while on pressors - holding 81 mg ASA for now till secretions clear  - strict I/Os - appears euvolemic, hold further diuresis, net - 14.6L - monitor UOP; bladder scan q 4, trend BMET- renal function great thus far - continue crestor   DMT2, uncontrolled - HA1c 11.5 - SSI resistant - lantus 10 units BID - starting steroids 6/1  Morbid Obesity  - BMI 44 - counseling when appropriate    Best practice (right click and "Reselect all SmartList Selections" daily)   Diet:  NPO; if not extubated 6/2, start TF Pain/Anxiety/Delirium protocol (if indicated): Yes (RASS goal -1) VAP protocol (if indicated): Yes DVT prophylaxis: SCD GI prophylaxis: PPI Glucose control:  SSI Yes Central venous access:  N/A Arterial line:  Yes, and it is still needed Foley:  N/A; bladder scan q 4 Mobility:  bed rest  PT consulted: N/A Last date of multidisciplinary goals of care discussion [pending] Code Status:  full code Disposition: tx ICU  Dr. Tamala Julian attempted to reach family, unable thus far  Labs   CBC: Recent Labs  Lab 11/16/20 0421 11/18/20 4010 11/19/20 0224 11/20/20 0254 11/20/20 0841 11/20/20 0842 11/21/20 0556  WBC 9.6 7.5 6.2 7.3  --   --  6.1  NEUTROABS 8.7* 4.4 3.6 3.9  --   --  3.7  HGB 17.1* 17.6* 17.2* 18.1* 19.0* 19.4* 19.4*  HCT 51.7 54.6* 54.2* 55.9* 56.0* 57.0* 58.5*  MCV 85.5 86.5 87.0 86.4  --   --  85.5  PLT 226 260 256 281  --   --  272    Basic Metabolic Panel: Recent Labs  Lab 11/16/20 0009 11/16/20 0421 11/17/20 0155 11/18/20 5366 11/19/20 0224 11/20/20 0254 11/20/20 0841 11/20/20 0842 11/21/20 0556  NA 137 134* 138 137 135 134* 142 140 136  K 3.8 4.7 3.8 3.9 3.5 4.6 3.7 4.0 3.9  CL 97* 100 95* 95* 96* 99  --   --  103  CO2 30 27 34* 36* 32 25  --   --  23  GLUCOSE 242* 411* 156* 124* 191* 200*  --   --  153*  BUN 14 15 17 15 17 19   --   --  17  CREATININE 0.66 0.72 0.85 0.77 0.82 0.82  --   --  0.75  CALCIUM 8.8* 8.8* 9.1 9.0 9.1 9.2  --   --  9.3  MG 1.7 1.7 1.7  --   --  2.0  --   --   --   PHOS  --  3.7  --   --   --   --   --   --   --    GFR: Estimated Creatinine Clearance: 186 mL/min (by C-G formula based on SCr of 0.75 mg/dL). Recent Labs  Lab 11/15/20 1632 11/16/20 0008 11/16/20 0009 11/16/20 0421 11/18/20 4403 11/19/20 0224 11/20/20 0254 11/21/20 0556  PROCALCITON  --   --  <0.10 <0.10  --   --   --   --   WBC  --   --   --  9.6 7.5 6.2 7.3 6.1  LATICACIDVEN 1.4 1.3  --  1.4  --   --   --    --     Liver Function Tests: Recent Labs  Lab 11/17/20 0155 11/18/20 0625 11/19/20 0224 11/20/20 0254 11/21/20 0556  AST 17 16 17  32 27  ALT 29 28 27 31  42  ALKPHOS 99 94 99 95 96  BILITOT 0.7 0.9 0.8 1.0 0.6  PROT 6.2* 6.7 6.4* 6.6 6.5  ALBUMIN 3.1* 3.3* 3.2* 3.3* 3.3*   No results for input(s): LIPASE, AMYLASE in the last 168 hours. No results for input(s): AMMONIA in the last 168 hours.  ABG    Component Value Date/Time   PHART 7.408 11/18/2020 2239   PCO2ART 52.6 (H) 11/18/2020 2239   PO2ART 66.7 (L) 11/18/2020 2239   HCO3 29.2 (H) 11/20/2020 0842   TCO2 31 11/20/2020 0842   ACIDBASEDEF 1.0 11/20/2020 0842   O2SAT 59.0 11/20/2020 0842     Coagulation Profile: No results for input(s): INR, PROTIME in the last 168 hours.  Cardiac Enzymes: Recent Labs  Lab 11/16/20 0009 11/16/20 0421  CKTOTAL 38* 38*    HbA1C: Hgb A1c MFr Bld  Date/Time Value Ref Range Status  11/16/2020 04:21 AM 11.5 (H) 4.8 - 5.6 % Final    Comment:    (NOTE)         Prediabetes: 5.7 - 6.4         Diabetes: >6.4         Glycemic control for adults with diabetes: <7.0   11/16/2020 12:09 AM 11.5 (H) 4.8 - 5.6 % Final    Comment:    (NOTE)         Prediabetes: 5.7 - 6.4         Diabetes: >6.4         Glycemic control for adults with diabetes: <7.0     CBG: Recent Labs  Lab 11/20/20 2350 11/21/20 0347 11/21/20 0651 11/21/20 1110 11/21/20 1456  GLUCAP 226* 167* 127* 189* 186*     CCT 35 mins     Kennieth Rad, ACNP [facility] 11/21/2020, 3:14 PM

## 2020-11-21 NOTE — Progress Notes (Signed)
Patient ID: Marc Wright, male   DOB: 08/12/1975, 45 y.o.   MRN: [id number]     Advanced Heart Failure Rounding Note  PCP-Cardiologist: None   Subjective:    No complaints this morning.  Just got back from cMRI, going for bronchoscopy/biopsy later today.   RHC/LHC: Coronary Findings   Diagnostic Dominance: Right  Left Anterior Descending  Prox LAD lesion is 50% stenosed.  Right Coronary Artery  Prox RCA lesion is 100% stenosed. There are weak right-right collaterals, no left-right collaterals.   Intervention   No interventions have been documented.  Right Heart  Right Heart Pressures RHC Procedural Findings: Hemodynamics (mmHg) RA mean 2 RV 31/3 PA 30/15, mean 21 PCWP mean 8 LV 101/15 AO 100/71  Oxygen saturations: PA 60% AO 93%  Cardiac Output (Fick) 4.6  Cardiac Index (Fick) 1.64   Cardiac Output (Thermo) 5.98  Cardiac Index (Thermo) 2.13      Objective:   Weight Range: (!) 161.2 kg Body mass index is 44.42 kg/m.   Vital Signs:   Temp:  [97.8 F (36.6 C)-98.4 F (36.9 C)] 97.9 F (36.6 C) (06/01 0345) Pulse Rate:  [83-93] 83 (06/01 0345) Resp:  [18-20] 18 (06/01 0900) BP: (97-119)/(65-77) 97/65 (06/01 0900) SpO2:  [94 %-98 %] 97 % (06/01 0900) Weight:  [161.2 kg] 161.2 kg (06/01 0000) Last BM Date: 11/19/20  Weight change: Filed Weights   11/19/20 0037 11/20/20 0505 11/21/20 0000  Weight: (!) 165.7 kg (!) 161.5 kg (!) 161.2 kg    Intake/Output:   Intake/Output Summary (Last 24 hours) at 11/21/2020 1136 Last data filed at 11/21/2020 0350 Gross per 24 hour  Intake 1327 ml  Output 2125 ml  Net -798 ml      Physical Exam    General:  Well appearing. No resp difficulty HEENT: Normal Neck: Supple. Thick, JVP difficult. Carotids 2+ bilat; no bruits. No lymphadenopathy or thyromegaly appreciated. Cor: PMI nondisplaced. Regular rate & rhythm. No rubs, gallops or murmurs. Lungs: Clear Abdomen: Soft, nontender, nondistended. No  hepatosplenomegaly. No bruits or masses. Good bowel sounds. Extremities: No cyanosis, clubbing, rash, edema Neuro: Alert & orientedx3, cranial nerves grossly intact. moves all 4 extremities w/o difficulty. Affect pleasant   Telemetry   NSR with 1st degree AVB.  Personally reviewed   Labs    CBC Recent Labs    11/20/20 0254 11/20/20 0841 11/20/20 0842 11/21/20 0556  WBC 7.3  --   --  6.1  NEUTROABS 3.9  --   --  3.7  HGB 18.1*   < > 19.4* 19.4*  HCT 55.9*   < > 57.0* 58.5*  MCV 86.4  --   --  85.5  PLT 281  --   --  231   < > = values in this interval not displayed.   Basic Metabolic Panel Recent Labs    29/92/42 0254 11/20/20 0841 11/20/20 0842 11/21/20 0556  NA 134*   < > 140 136  K 4.6   < > 4.0 3.9  CL 99  --   --  103  CO2 25  --   --  23  GLUCOSE 200*  --   --  153*  BUN 19  --   --  17  CREATININE 0.82  --   --  0.75  CALCIUM 9.2  --   --  9.3  MG 2.0  --   --   --    < > = values in this interval not displayed.  Liver Function Tests Recent Labs    11/20/20 0254 11/21/20 0556  AST 32 27  ALT 31 42  ALKPHOS 95 96  BILITOT 1.0 0.6  PROT 6.6 6.5  ALBUMIN 3.3* 3.3*   No results for input(s): LIPASE, AMYLASE in the last 72 hours. Cardiac Enzymes No results for input(s): CKTOTAL, CKMB, CKMBINDEX, TROPONINI in the last 72 hours.  BNP: BNP (last 3 results) Recent Labs    11/15/20 1556  BNP 598.1*    ProBNP (last 3 results) No results for input(s): PROBNP in the last 8760 hours.   D-Dimer No results for input(s): DDIMER in the last 72 hours. Hemoglobin A1C No results for input(s): HGBA1C in the last 72 hours. Fasting Lipid Panel No results for input(s): CHOL, HDL, LDLCALC, TRIG, CHOLHDL, LDLDIRECT in the last 72 hours. Thyroid Function Tests No results for input(s): TSH, T4TOTAL, T3FREE, THYROIDAB in the last 72 hours.  Invalid input(s): FREET3  Other results:   Imaging     No results found.   Medications:     Scheduled  Medications: . aspirin EC  81 mg Oral Daily  . carvedilol  3.125 mg Oral BID WC  . digoxin  0.125 mg Oral Daily  . furosemide  20 mg Oral Daily  . insulin aspart  0-15 Units Subcutaneous Q4H  . nicotine  21 mg Transdermal Daily  . rosuvastatin  20 mg Oral Daily  . sacubitril-valsartan  1 tablet Oral BID  . sodium chloride flush  3 mL Intravenous Q12H  . sodium chloride flush  3 mL Intravenous Q12H  . sodium chloride flush  3 mL Intravenous Q12H  . spironolactone  25 mg Oral Daily     Infusions: . sodium chloride    . sodium chloride       PRN Medications:  sodium chloride, sodium chloride, acetaminophen, HYDROcodone-acetaminophen, nitroGLYCERIN, ondansetron (ZOFRAN) IV, sodium chloride flush, sodium chloride flush   Assessment/Plan   1. 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  Echo this admission with basal-mid inferior akinesis, dyssynchrony from LBBB, EF 20-25%, moderate LVH, mildly decreased RV function.  RHC/LHC today with normal filling pressures, marginal CI (2.1 by thermodilution is likely more accurate than Fick); cath showed 50% proximal LAD and occluded RCA (chronic).  Extent of cardiomyopathy is not explained by coronary disease, suspect there is a nonischemic component that could potentially be from cardiac sarcoidosis.  On exam, not volume overloaded.   - Continue digoxin 0.125, follow closely with 1st degree AVB.  - Continue current Entresto, spironolactone, and Coreg.  - Will not add SGLT2 inhibitor yet with uncontrolled diabetes.   - Start Lasix 20 mg daily.  - Cardiac MRI to look for evidence for cardiac sarcoidosis, may need eventual cardiac PET.  - Needs EP followup as outpatient for CRT-D evaluation (has had low EF since 2018, has wide LBBB).  2. CAD: Presentation with CHF, not ACS.  LHC with 50% pLAD, totally occluded proximal RCA with R=>R collaterals (old occlusion).  Reviewed with interventional cardiology, would hold off  on PCI attempt at this time as CTO.  - ASA 81 daily - Crestor 20 mg daily.  3. OHS/OSA: Needs CPAP, on oxygen during the day.  4. Pulmonary: high resolution CT chest showed dense bilateral consolidations and enlarged hilar/mediastinal nodes.  ?Pulmonary sarcoidosis. Does not appear infectious.  Sarcoid may be unifying diagnosis for heart and lungs.  Legrand Rams bronchoscopy/biopsy for definitive diagnosis => to be done today.  5. Smoking: Advised to quit.  6. Type 2 diabetes: Per primary service.   Length of Stay: 6  Marca Ancona, MD  11/21/2020, 11:36 AM  Advanced Heart Failure Team Pager [phone] (M-F; 7a - 5p)  Please contact [facility] for night-coverage after hours (5p -7a ) and weekends on [email or web address]

## 2020-11-21 NOTE — Anesthesia Preprocedure Evaluation (Addendum)
Anesthesia Evaluation  Patient identified by MRN, date of birth, ID band Patient awake    Reviewed: Allergy & Precautions, NPO status , Patient's Chart, lab work & pertinent test results  Airway Mallampati: III  TM Distance: >3 FB Neck ROM: Full    Dental  (+) Poor Dentition, Dental Advisory Given, Chipped,    Pulmonary asthma (childhood) , sleep apnea (does not tolerate CPAP) , Current Smoker,  CT chest 5/30 IMPRESSION: 1. There are multiple dense bilateral consolidations, generally with spiculated margins and internal air bronchograms. The largest and most dense consolidations are in the bilateral superior segments of the lower lobes. These are unchanged compared to recent CT dated 11/15/2020 are slightly increased in size and density in comparison to the prior dated 11/06/2018. There may be some very fine associated nodularity. This unusual constellation of findings, in combination with enlarged mediastinal and hilar lymph nodes, suggests pulmonary sarcoidosis with progressive massive fibrosis. 2. Numerous enlarged mediastinal and bilateral hilar lymph nodes, unchanged, nonspecific, although as above, may reflect nodal sarcoidosis. 3. Cardiomegaly and coronary artery disease.   Pulmonary exam normal breath sounds clear to auscultation       Cardiovascular hypertension, Pt. on medications pulmonary hypertension (mild pHTN)+ CAD (100% occlusion RCA on cath w/ collaterals, prox LAD 50% occluded) and +CHF (LVEF 20-25%, suspect cardiac sarcoid)  Normal cardiovascular exam Rhythm:Regular Rate:Normal  [facility] 11/20/20   Prox RCA lesion is 100% stenosed.  Prox LAD lesion is 50% stenosed.  1. Normal filling pressures.  2. Low cardiac output. Mildly decreased by thermodilution which is likely more accurate as oxygen saturation was up and down during case so unsure of Fick accuracy.  3. Totally occluded RCA is likely chronic.  There are R=>R collaterals, not L=>R collaterals. 50% stenosis proximal LAD.   Suspect mixed ischemic/nonischemic cardiomyopathy.     Echo 10/2020: 1. Akinesis noted of the basal to mid septum and basal to mid inferior  wall. There is also septal movement consistent with LBBB. Unusual pattern  of akinesis for coronary distribution noted. Would consider infiltrative  cardiomyopathy such as sarcoidosis  and would recommend a cardiac MRI for clarification. Left ventricular  ejection fraction, by estimation, is 20 to 25%. The left ventricle has  severely decreased function. The left ventricle demonstrates regional wall  motion abnormalities (see scoring  diagram/findings for description). The left ventricular internal cavity  size was moderately dilated. There is moderate concentric left ventricular  hypertrophy. Indeterminate diastolic filling due to E-A fusion.  2. Right ventricular systolic function is mildly reduced. The right  ventricular size is mildly enlarged. There is mildly elevated pulmonary  artery systolic pressure. The estimated right ventricular systolic  pressure is 42.9 mmHg.  3. The mitral valve is grossly normal. Trivial mitral valve  regurgitation. No evidence of mitral stenosis.  4. The aortic valve is tricuspid. Aortic valve regurgitation is not  visualized. No aortic stenosis is present.  5. The inferior vena cava is dilated in size with <50% respiratory  variability, suggesting right atrial pressure of 15 mmHg.    Neuro/Psych negative neurological ROS  negative psych ROS   GI/Hepatic negative GI ROS, Neg liver ROS,   Endo/Other  diabetes, Poorly Controlled, Type 2, Oral Hypoglycemic AgentsMorbid obesitya1c 11.5 BMI 44 Last FS 189  Renal/GU negative Renal ROS  negative genitourinary   Musculoskeletal negative musculoskeletal ROS (+)   Abdominal (+) + obese,   Peds  Hematology hct 58.5   Anesthesia Other Findings    Reproductive/Obstetrics negative OB ROS  Anesthesia Physical Anesthesia Plan  ASA: IV  Anesthesia Plan: General   Post-op Pain Management:    Induction: Intravenous  PONV Risk Score and Plan: 1 and Ondansetron and Treatment may vary due to age or medical condition  Airway Management Planned: Oral ETT and Video Laryngoscope Planned  Additional Equipment: None  Intra-op Plan:   Post-operative Plan: Extubation in OR and Possible Post-op intubation/ventilation  Informed Consent: I have reviewed the patients History and Physical, chart, labs and discussed the procedure including the risks, benefits and alternatives for the proposed anesthesia with the patient or authorized representative who has indicated his/her understanding and acceptance.     Dental advisory given  Plan Discussed with: CRNA  Anesthesia Plan Comments: (Per critical care: needs extended obs in PACU (has hypercapnia baseline))       Anesthesia Quick Evaluation

## 2020-11-21 NOTE — Progress Notes (Signed)
Patient extremely difficult to sedate, 2mg  versed push given, propofol at 40, and wrist restraints applied as four people helped to hold patient down while getting an abdominal xray. Will continue to monitor closely. E, RN 11/21/2020 1630

## 2020-11-21 NOTE — Progress Notes (Signed)
Patient went hypotensive and into bronchospasm in endo: will work on vent/pressor wean in ICU.

## 2020-11-21 NOTE — Transfer of Care (Signed)
Immediate Anesthesia Transfer of Care Note  Patient: Marc Wright  Procedure(s) Performed: VIDEO BRONCHOSCOPY WITH ENDOBRONCHIAL ULTRASOUND (N/A ) BRONCHIAL BIOPSIES BRONCHIAL NEEDLE ASPIRATION BIOPSIES  Patient Location: ICU  Anesthesia Type:General  Level of Consciousness: Patient remains intubated per anesthesia plan  Airway & Oxygen Therapy: Patient remains intubated per anesthesia plan and Patient placed on Ventilator (see vital sign flow sheet for setting)  Post-op Assessment: Report given to RN and Post -op Vital signs reviewed and stable  Post vital signs: Reviewed and stable  Last Vitals:  Vitals Value Taken Time  BP    Temp    Pulse 89 11/21/20 1457  Resp 17 11/21/20 1457  SpO2 90 % 11/21/20 1457  Vitals shown include unvalidated device data.  Last Pain:  Vitals:   11/21/20 1228  TempSrc: Oral  PainSc: 4       Patients Stated Pain Goal: 0 (11/21/20 1038)  Complications: No complications documented.

## 2020-11-21 NOTE — Addendum Note (Signed)
Addendum  created 11/21/20 1655 by Drema Pry, CRNA   Charge Capture section accepted

## 2020-11-22 ENCOUNTER — Encounter (HOSPITAL_COMMUNITY): Payer: Self-pay | Admitting: Internal Medicine

## 2020-11-22 DIAGNOSIS — Z6841 Body Mass Index (BMI) 40.0 and over, adult: Secondary | ICD-10-CM

## 2020-11-22 LAB — POCT I-STAT 7, (LYTES, BLD GAS, ICA,H+H)
Acid-base deficit: 7 mmol/L — ABNORMAL HIGH (ref 0.0–2.0)
Bicarbonate: 19.2 mmol/L — ABNORMAL LOW (ref 20.0–28.0)
Calcium, Ion: 1.15 mmol/L (ref 1.15–1.40)
HCT: 56 % — ABNORMAL HIGH (ref 39.0–52.0)
Hemoglobin: 19 g/dL — ABNORMAL HIGH (ref 13.0–17.0)
O2 Saturation: 91 %
Potassium: 4.5 mmol/L (ref 3.5–5.1)
Sodium: 137 mmol/L (ref 135–145)
TCO2: 20 mmol/L — ABNORMAL LOW (ref 22–32)
pCO2 arterial: 40.8 mmHg (ref 32.0–48.0)
pH, Arterial: 7.279 — ABNORMAL LOW (ref 7.350–7.450)
pO2, Arterial: 68 mmHg — ABNORMAL LOW (ref 83.0–108.0)

## 2020-11-22 LAB — CBC WITH DIFFERENTIAL/PLATELET
Abs Immature Granulocytes: 0.08 K/uL — ABNORMAL HIGH (ref 0.00–0.07)
Basophils Absolute: 0 K/uL (ref 0.0–0.1)
Basophils Relative: 0 %
Eosinophils Absolute: 0 K/uL (ref 0.0–0.5)
Eosinophils Relative: 0 %
HCT: 58 % — ABNORMAL HIGH (ref 39.0–52.0)
Hemoglobin: 18.9 g/dL — ABNORMAL HIGH (ref 13.0–17.0)
Immature Granulocytes: 1 %
Lymphocytes Relative: 11 %
Lymphs Abs: 1.4 K/uL (ref 0.7–4.0)
MCH: 28.1 pg (ref 26.0–34.0)
MCHC: 32.6 g/dL (ref 30.0–36.0)
MCV: 86.2 fL (ref 80.0–100.0)
Monocytes Absolute: 0.5 K/uL (ref 0.1–1.0)
Monocytes Relative: 4 %
Neutro Abs: 10.5 K/uL — ABNORMAL HIGH (ref 1.7–7.7)
Neutrophils Relative %: 84 %
Platelets: 320 K/uL (ref 150–400)
RBC: 6.73 MIL/uL — ABNORMAL HIGH (ref 4.22–5.81)
RDW: 14 % (ref 11.5–15.5)
WBC: 12.4 K/uL — ABNORMAL HIGH (ref 4.0–10.5)
nRBC: 0 % (ref 0.0–0.2)

## 2020-11-22 LAB — ANTI-SCLERODERMA ANTIBODY: Scleroderma (Scl-70) (ENA) Antibody, IgG: <0.2 AI (ref 0.0–0.9)

## 2020-11-22 LAB — COMPREHENSIVE METABOLIC PANEL WITH GFR
ALT: 43 U/L (ref 0–44)
AST: 20 U/L (ref 15–41)
Albumin: 3.4 g/dL — ABNORMAL LOW (ref 3.5–5.0)
Alkaline Phosphatase: 91 U/L (ref 38–126)
Anion gap: 12 (ref 5–15)
BUN: 27 mg/dL — ABNORMAL HIGH (ref 6–20)
CO2: 19 mmol/L — ABNORMAL LOW (ref 22–32)
Calcium: 8.7 mg/dL — ABNORMAL LOW (ref 8.9–10.3)
Chloride: 101 mmol/L (ref 98–111)
Creatinine, Ser: 0.97 mg/dL (ref 0.61–1.24)
GFR, Estimated: >60 mL/min
Glucose, Bld: 245 mg/dL — ABNORMAL HIGH (ref 70–99)
Potassium: 4.8 mmol/L (ref 3.5–5.1)
Sodium: 132 mmol/L — ABNORMAL LOW (ref 135–145)
Total Bilirubin: 0.9 mg/dL (ref 0.3–1.2)
Total Protein: 6.7 g/dL (ref 6.5–8.1)

## 2020-11-22 LAB — ANTI-DNA ANTIBODY, DOUBLE-STRANDED: ds DNA Ab: <1 IU/mL (ref 0–9)

## 2020-11-22 LAB — PHOSPHORUS
Phosphorus: 2.3 mg/dL — ABNORMAL LOW (ref 2.5–4.6)
Phosphorus: 2.2 mg/dL — ABNORMAL LOW (ref 2.5–4.6)

## 2020-11-22 LAB — GLUCOSE, CAPILLARY
Glucose-Capillary: 270 mg/dL — ABNORMAL HIGH (ref 70–99)
Glucose-Capillary: 286 mg/dL — ABNORMAL HIGH (ref 70–99)
Glucose-Capillary: 280 mg/dL — ABNORMAL HIGH (ref 70–99)
Glucose-Capillary: 261 mg/dL — ABNORMAL HIGH (ref 70–99)
Glucose-Capillary: 265 mg/dL — ABNORMAL HIGH (ref 70–99)

## 2020-11-22 LAB — COOXEMETRY PANEL
Carboxyhemoglobin: 1 % (ref 0.5–1.5)
Methemoglobin: 0.9 % (ref 0.0–1.5)
O2 Saturation: 81.4 %
Total hemoglobin: 17.8 g/dL — ABNORMAL HIGH (ref 12.0–16.0)

## 2020-11-22 LAB — TROPONIN I (HIGH SENSITIVITY)
Troponin I (High Sensitivity): 24 ng/L — ABNORMAL HIGH (ref <18)
Troponin I (High Sensitivity): 24 ng/L — ABNORMAL HIGH (ref <18)

## 2020-11-22 LAB — TRIGLYCERIDES: Triglycerides: 209 mg/dL — ABNORMAL HIGH (ref <150)

## 2020-11-22 LAB — CYTOLOGY - NON PAP

## 2020-11-22 LAB — MAGNESIUM
Magnesium: 1.4 mg/dL — ABNORMAL LOW (ref 1.7–2.4)
Magnesium: 1.4 mg/dL — ABNORMAL LOW (ref 1.7–2.4)

## 2020-11-22 LAB — SJOGRENS SYNDROME-A EXTRACTABLE NUCLEAR ANTIBODY: SSA (Ro) (ENA) Antibody, IgG: <0.2 AI (ref 0.0–0.9)

## 2020-11-22 LAB — GLOMERULAR BASEMENT MEMBRANE ANTIBODIES: GBM Ab: 3 units (ref 0–20)

## 2020-11-22 LAB — SJOGRENS SYNDROME-B EXTRACTABLE NUCLEAR ANTIBODY: SSB (La) (ENA) Antibody, IgG: <0.2 AI (ref 0.0–0.9)

## 2020-11-22 LAB — RHEUMATOID FACTOR: Rheumatoid fact SerPl-aCnc: <10 IU/mL (ref <14.0)

## 2020-11-22 MED ORDER — PROSOURCE TF PO LIQD: 45.0000 mL | Freq: Two times a day (BID) | ORAL | Status: DC

## 2020-11-22 MED ORDER — FUROSEMIDE 10 MG/ML IJ SOLN
40.0000 mg | Freq: Once | INTRAMUSCULAR | Status: AC
  Administered 2020-11-22: 40 mg via INTRAVENOUS
  Filled 2020-11-22: qty 4

## 2020-11-22 MED ORDER — INSULIN GLARGINE 100 UNIT/ML ~~LOC~~ SOLN
20.0000 [IU] | Freq: Two times a day (BID) | SUBCUTANEOUS | Status: DC
  Administered 2020-11-22 (×2): 20 [IU] via SUBCUTANEOUS
  Filled 2020-11-22 (×4): qty 0.2

## 2020-11-22 MED ORDER — MAGNESIUM SULFATE 4 GM/100ML IV SOLN
4.0000 g | Freq: Once | INTRAVENOUS | Status: AC
  Administered 2020-11-22: 4 g via INTRAVENOUS
  Filled 2020-11-22 (×2): qty 100

## 2020-11-22 MED ORDER — VITAL AF 1.2 CAL PO LIQD
1000.0000 mL | ORAL | Status: DC
  Administered 2020-11-22: 1000 mL
  Filled 2020-11-22 (×3): qty 1000

## 2020-11-22 MED ORDER — BISACODYL 10 MG RE SUPP
10.0000 mg | Freq: Once | RECTAL | Status: AC
  Administered 2020-11-22: 10 mg via RECTAL
  Filled 2020-11-22: qty 1

## 2020-11-22 MED ORDER — ASPIRIN 81 MG PO CHEW
81.0000 mg | CHEWABLE_TABLET | Freq: Every day | ORAL | Status: DC
  Administered 2020-11-22 – 2020-11-23 (×2): 81 mg
  Filled 2020-11-22 (×2): qty 1

## 2020-11-22 MED ORDER — PROSOURCE TF PO LIQD
90.0000 mL | Freq: Every day | ORAL | Status: DC
  Administered 2020-11-22 – 2020-11-23 (×5): 90 mL
  Filled 2020-11-22 (×5): qty 90

## 2020-11-22 MED ORDER — VITAL HIGH PROTEIN PO LIQD: 1000.0000 mL | ORAL | Status: DC

## 2020-11-22 NOTE — Progress Notes (Signed)
Attempted to wean again this morning  Any attempt at decreasing sedation is not well-tolerated with patient biting down on tube and not redirectable Still remains on pressors  Will continue to monitor

## 2020-11-22 NOTE — Progress Notes (Signed)
NAME:  Marc Wright, MRN:  [id number], DOB:  11-08-1975, LOS: 7 ADMISSION DATE:  11/15/2020, CONSULTATION DATE:  11/22/20 REFERRING MD:  TRH CHIEF COMPLAINT: Shortness of breath  BRIEF  45 year old whom we are seeing in consultation for evaluation of abnormal chest imaging hypoxemia for concern of/ rule out pulmonary / cardiac sarcoidosis   Patient is 1 week history of worsening dyspnea on exertion.  Associated with increased nasal congestion.  Developed shortness of breath at rest.  Shortness of breath worse when lying down, improves when sitting up.  Noted increasing lower extremity swelling more so on the left leg than the right over the same timeframe.  Denies respiratory issues with pollen earlier in the spring.  Denies any wheezing.  Worsened over time which prompted presentation to ED.  There BNP elevated.  Chest x-ray congested.  CT scan with bilateral) right greater than left pleural effusions with mild interlobular septal thickening.  Had several episodes of infiltrate most notably in bilateral lower lobes.  Reviewed CT scan 2020 monitor patient with similar appearing infiltrates although more prominent this admission.  No fever or chills.  Does have mild cough, sounds almost blood-tinged.  Reviewed labs extensively no leukocytosis, no fever here, procalcitonin negative x2.  Received Lasix.  He is feeling better.  Weaning oxygen.  Notes he desats when he sleeps but has known sleep apnea.    Cardiomyopathy-ejection fraction of 20 to 25%  Underwent bronchoscopy with EBUS 6/1 Decompensated-currently on ventilator, requiring pressors   Pertinent  Medical History  Cardiomyopathy, asthma, seasonal allergies  Significant Hospital Events: Including procedures, antibiotic start and stop dates in addition to other pertinent events   . 5/25-10/2024 admitted to hospital, began diuresed, got dose of Solu-Medrol 60 mg IV x1 . 5/27 pulmonary consulted . 5/30 Most respiratory symptoms resolved or  markedly improved 5/31  -s/p R/LHC w/ Dr. Aundra Dubin. ok for patient to undergo bronch/EBUS under [facility] while as inpatient based on cath results.  .  6/1 Underwent bronchoscopy with EBUS .  6/1 on vent    Interim History / Subjective:   Post bronchoscopy with EBUS, stage VII lymph node aspirated Decompensated during procedure with bronchospasm and hypotension In ICU on vent Overnight agitation Attempt at weaning this morning, became very agitated biting down on tube, not redirectable, not able to ventilate  Objective   Blood pressure 120/79, pulse 95, temperature 98.2 F (36.8 C), temperature source Axillary, resp. rate (!) 32, height 6\' 3"  (1.905 m), weight (!) 162.4 kg, SpO2 (!) 88 %.    Vent Mode: PRVC FiO2 (%):  [30 %-60 %] 30 % Set Rate:  [18 bmp] 18 bmp Vt Set:  [670 mL] 670 mL PEEP:  [5 cmH20] 5 cmH20 Plateau Pressure:  [18 cmH20-25 cmH20] 18 cmH20   Intake/Output Summary (Last 24 hours) at 11/22/2020 0854 Last data filed at 11/22/2020 0600 Gross per 24 hour  Intake 1385.65 ml  Output 600 ml  Net 785.65 ml   Filed Weights   11/20/20 0505 11/21/20 0000 11/22/20 0630  Weight: (!) 161.5 kg (!) 161.2 kg (!) 162.4 kg    Examination: General: Morbidly obese, does not appear to be in distress HEENT: Moist oral mucosa, endotracheal tube in place Neuro: Sedated, not able to follow commands CV: S1-S2 appreciated PULM: Frequent coughs, mild rhonchi GI: Bowel sounds appreciated Extremities: Warm/dry, 1+ edema Skin: no rashes    Labs/imaging that I havepersonally reviewed  (right click and "Reselect all SmartList Selections" daily)  BMET, CBC Hgb 19.4/  Hct 58.5   5/27 TTE LVEF 20-25%, akinesis of basal to mid spetum and basal to mid inferior wall-> consider infiltrative cardiomyopathy, RV systolic function mildly reduced, LVH, indeterminate DD function  5/30 HRCT >>1. There are multiple dense bilateral consolidations, generally with spiculated margins and internal air  bronchograms. The largest and most dense consolidations are in the bilateral superior segments of the lower lobes. These are unchanged compared to recent CT dated 11/15/2020 are slightly increased in size and density in comparison to the prior dated 11/06/2018. There may be some very fine associated nodularity. This unusual constellation of findings, in combination with enlarged mediastinal and hilar lymph nodes, suggests pulmonary sarcoidosis with progressive massive fibrosis. 2. Numerous enlarged mediastinal and bilateral hilar lymph nodes, unchanged, nonspecific, although as above, may reflect nodal sarcoidosis. 3. Cardiomegaly and coronary artery disease.  5/31 R/LHC >>  Prox RCA lesion is 100% stenosed.  Prox LAD lesion is 50% stenosed.   1. Normal filling pressures.  2. Low cardiac output.  Mildly decreased by thermodilution which is likely more accurate as oxygen saturation was up and down during case so unsure of Fick accuracy.  3. Totally occluded RCA is likely chronic.  There are R=>R collaterals, not L=>R collaterals.  50% stenosis proximal LAD.   Suspect mixed ischemic/nonischemic cardiomyopathy.   6/1 cMRI >> 1. Bilateral airspace disease, see dedicated chest CT from earlier this admission. 2. Severely dilated left ventricle with marked septal-lateral dyssynchrony as well as inferior severe hypokinesis and septal severe hypokinesis. Difficult to quantify EF due to dyssynchrony, but calculated at 18%. 3.  Mildly dilated RV with severe systolic dysfunction, EF 85%. 4. Poor delayed enhancement images with significant artifact.  Probably inferior RV insertion site LGE.  Resolved Hospital Problem list     Assessment & Plan:   Acute hypoxemic respiratory failure -Continue full ventilator support Target TVol 6-8cc/kgIBW Target Plateau Pressure < 30cm H20 Target PaO2 55-65: titrate PEEP/FiO2 per protocol Ventilator associated pneumonia prevention protocol  Bronchospasm -On  steroids -Continue bronchodilators   Persistent bilateral infiltrate with a high ACE level, mediastinal adenopathy -Concern for sarcoidosis -Post bronchoscopy, biopsy results pending -Systolic cardiomyopathy felt may be related to sarcoidosis as well -Follow-up ANA, CCP, GBM, ANCA panel, SCL 70, SSA/SSB  History of obstructive sleep apnea -Noncompliant with CPAP therapy  Hypotension -Still requiring pressors at present -Wean off pressors as tolerated  Acute on chronic systolic heart failure Mixed ischemic/nonischemic cardiomyopathy Coronary artery disease -Concern for sarcoidosis -Results from right heart catheterization noted- -has diuresed significantly since admission  Type 2 diabetes -Continue SSI -Continue Lantus  Morbid obesity  Attempt of weaning this morning was unsuccessful Patient not unable to follow commands Unable to ventilate while trying to wean off sedation -We will reattempt this afternoon   Best practice (right click and "Reselect all SmartList Selections" daily)  Diet:  Tube Feed ; Pain/Anxiety/Delirium protocol (if indicated): Yes (RASS goal -1) VAP protocol (if indicated): Yes DVT prophylaxis: SCD GI prophylaxis: PPI Glucose control:  SSI Yes Central venous access:  N/A, PICC in place Arterial line:  Yes, and it is still needed Foley:  N/A; bladder scan q 4 Mobility:  bed rest  PT consulted: N/A Last date of multidisciplinary goals of care discussion [pending] Code Status:  full code Disposition: tx ICU   Labs   CBC: Recent Labs  Lab 11/18/20 0625 11/19/20 0224 11/20/20 0254 11/20/20 0841 11/20/20 0842 11/21/20 0556 11/21/20 1550 11/22/20 0443 11/22/20 0759  WBC 7.5 6.2 7.3  --   --  6.1  --  12.4*  --   NEUTROABS 4.4 3.6 3.9  --   --  3.7  --  10.5*  --   HGB 17.6* 17.2* 18.1*   < > 19.4* 19.4* 17.7* 18.9* 19.0*  HCT 54.6* 54.2* 55.9*   < > 57.0* 58.5* 52.0 58.0* 56.0*  MCV 86.5 87.0 86.4  --   --  85.5  --  86.2  --   PLT  260 256 281  --   --  231  --  320  --    < > = values in this interval not displayed.    Basic Metabolic Panel: Recent Labs  Lab 11/16/20 0009 11/16/20 0421 11/17/20 0155 11/18/20 3818 11/19/20 2993 11/20/20 7169 11/20/20 6789 11/20/20 3810 11/21/20 0556 11/21/20 1550 11/22/20 0443 11/22/20 0759  NA 137 134* 138 137 135 134*   < > 140 136 141 132* 137  K 3.8 4.7 3.8 3.9 3.5 4.6   < > 4.0 3.9 4.2 4.8 4.5  CL 97* 100 95* 95* 96* 99  --   --  103  --  101  --   CO2 30 27 34* 36* 32 25  --   --  23  --  19*  --   GLUCOSE 242* 411* 156* 124* 191* 200*  --   --  153*  --  245*  --   BUN 14 15 17 15 17 19   --   --  17  --  27*  --   CREATININE 0.66 0.72 0.85 0.77 0.82 0.82  --   --  0.75  --  0.97  --   CALCIUM 8.8* 8.8* 9.1 9.0 9.1 9.2  --   --  9.3  --  8.7*  --   MG 1.7 1.7 1.7  --   --  2.0  --   --   --   --   --   --   PHOS  --  3.7  --   --   --   --   --   --   --   --   --   --    < > = values in this interval not displayed.   GFR: Estimated Creatinine Clearance: 154.1 mL/min (by C-G formula based on SCr of 0.97 mg/dL). Recent Labs  Lab 11/15/20 1632 11/16/20 0008 11/16/20 0009 11/16/20 0421 11/18/20 11/20/20 11/19/20 0224 11/20/20 0254 11/21/20 0556 11/22/20 0443  PROCALCITON  --   --  <0.10 <0.10  --   --   --   --   --   WBC  --   --   --  9.6   < > 6.2 7.3 6.1 12.4*  LATICACIDVEN 1.4 1.3  --  1.4  --   --   --   --   --    < > = values in this interval not displayed.    Liver Function Tests: Recent Labs  Lab 11/18/20 0625 11/19/20 0224 11/20/20 0254 11/21/20 0556 11/22/20 0443  AST 16 17 32 27 20  ALT 28 27 31  42 43  ALKPHOS 94 99 95 96 91  BILITOT 0.9 0.8 1.0 0.6 0.9  PROT 6.7 6.4* 6.6 6.5 6.7  ALBUMIN 3.3* 3.2* 3.3* 3.3* 3.4*   No results for input(s): LIPASE, AMYLASE in the last 168 hours. No results for input(s): AMMONIA in the last 168 hours.  ABG    Component Value Date/Time   PHART 7.279 (L) 11/22/2020  PCO2ART 40.8 11/22/2020  0759   PO2ART 68 (L) 11/22/2020 0759   HCO3 19.2 (L) 11/22/2020 0759   TCO2 20 (L) 11/22/2020 0759   ACIDBASEDEF 7.0 (H) 11/22/2020 0759   O2SAT 91.0 11/22/2020 0759     Coagulation Profile: No results for input(s): INR, PROTIME in the last 168 hours.  Cardiac Enzymes: Recent Labs  Lab 11/16/20 0009 11/16/20 0421  CKTOTAL 38* 38*    HbA1C: Hgb A1c MFr Bld  Date/Time Value Ref Range Status  11/16/2020 04:21 AM 11.5 (H) 4.8 - 5.6 % Final    Comment:    (NOTE)         Prediabetes: 5.7 - 6.4         Diabetes: >6.4         Glycemic control for adults with diabetes: <7.0   11/16/2020 12:09 AM 11.5 (H) 4.8 - 5.6 % Final    Comment:    (NOTE)         Prediabetes: 5.7 - 6.4         Diabetes: >6.4         Glycemic control for adults with diabetes: <7.0     CBG: Recent Labs  Lab 11/21/20 1456 11/21/20 1915 11/21/20 2314 11/22/20 0311 11/22/20 0717  GLUCAP 186* 235* 280* 270* 286*   The patient is critically ill with multiple organ systems failure and requires high complexity decision making for assessment and support, frequent evaluation and titration of therapies, application of advanced monitoring technologies and extensive interpretation of multiple databases. Critical Care Time devoted to patient care services described in this note independent of APP/resident time (if applicable)  is 40 minutes.   Sherrilyn Rist MD [facility] Personal pager: [phone] If unanswered, please page CCM On-call: [phone]

## 2020-11-22 NOTE — Progress Notes (Signed)
Pt w/ rhythm change noted on monitor.  Pt also noted in & out of afib this shift.  Pt sweaty from head to toe since this RN began at 7P.  Simple causes ruled out by this RN (room temp to lowest, fan on pt, blood sugar in 200's, no fever).  Given cardiac history, rhythm change, and diaphoresis, this RN performed bedside 12 lead EKG.  While machine interpretation indicates limb lead mix up, this RN verified that LA/LL and RA/RL were all on correct limbs.  No artifact in reading. This RN noted ST elevations in lateral V leads with reciprocal changes in inferior leads.  [facility] notified from room and EKG faxed to them for further review.

## 2020-11-22 NOTE — Progress Notes (Signed)
Patient ID: Marc Wright, male   DOB: 1976-03-23, 45 y.o.   MRN: [id number]     Advanced Heart Failure Rounding Note  PCP-Cardiologist: None   Subjective:    Developed bronchospasm/hypotension with bronchoscopy/biopsy on 6/1, intubated. Started on Solumedrol.   Remains intubated, failed SBT this morning.  Weaning sedation to try again.  CVP not set up.  He remains in NE 8 with stable BP. NSR.   Cardiac MRI: 1. Bilateral airspace disease, see dedicated chest CT from earlier this admission. 2. Severely dilated left ventricle with marked septal-lateral dyssynchrony as well as inferior severe hypokinesis and septal severe hypokinesis. Difficult to quantify EF due to dyssynchrony, but calculated at 18%. 3. Mildly dilated RV with severe systolic dysfunction, EF 66%. 4. Poor delayed enhancement images with significant artifact. Probably inferior RV insertion site LGE (nonspecific).  RHC/LHC: Coronary Findings   Diagnostic Dominance: Right  Left Anterior Descending  Prox LAD lesion is 50% stenosed.  Right Coronary Artery  Prox RCA lesion is 100% stenosed. There are weak right-right collaterals, no left-right collaterals.   Intervention   No interventions have been documented.  Right Heart  Right Heart Pressures RHC Procedural Findings: Hemodynamics (mmHg) RA mean 2 RV 31/3 PA 30/15, mean 21 PCWP mean 8 LV 101/15 AO 100/71  Oxygen saturations: PA 60% AO 93%  Cardiac Output (Fick) 4.6  Cardiac Index (Fick) 1.64   Cardiac Output (Thermo) 5.98  Cardiac Index (Thermo) 2.13      Objective:   Weight Range: (!) 162.4 kg Body mass index is 44.75 kg/m.   Vital Signs:   Temp:  [97.7 F (36.5 C)-98.9 F (37.2 C)] 98.2 F (36.8 C) (06/02 0748) Pulse Rate:  [58-95] 70 (06/02 0930) Resp:  [0-32] 32 (06/02 0757) BP: (105-133)/(69-90) 120/79 (06/02 0600) SpO2:  [88 %-100 %] 88 % (06/02 0757) Arterial Line BP: (77-155)/(49-86) 117/56 (06/02 0757) FiO2 (%):  [30 %-60  %] 30 % (06/02 0752) Weight:  [162.4 kg] 162.4 kg (06/02 0630) Last BM Date: 11/19/20  Weight change: Filed Weights   11/20/20 0505 11/21/20 0000 11/22/20 0630  Weight: (!) 161.5 kg (!) 161.2 kg (!) 162.4 kg    Intake/Output:   Intake/Output Summary (Last 24 hours) at 11/22/2020 1030 Last data filed at 11/22/2020 0600 Gross per 24 hour  Intake 1385.65 ml  Output 600 ml  Net 785.65 ml      Physical Exam    General: Inbuated/sedated Neck: JVP difficult, no thyromegaly or thyroid nodule.  Lungs: Crackles at bases.  CV: Nondisplaced PMI.  Heart regular S1/S2, no S3/S4, no murmur.  No peripheral edema.  No carotid bruit.  Normal pedal pulses. Abdomen: Soft, nontender, no hepatosplenomegaly, no distention.  Skin: Intact without lesions or rashes.  Neurologic: Sedated on vent Extremities: No clubbing or cyanosis.  HEENT: Normal.    Telemetry   NSR 90s.  Personally reviewed   Labs    CBC Recent Labs    11/21/20 0556 11/21/20 1550 11/22/20 0443 11/22/20 0759  WBC 6.1  --  12.4*  --   NEUTROABS 3.7  --  10.5*  --   HGB 19.4*   < > 18.9* 19.0*  HCT 58.5*   < > 58.0* 56.0*  MCV 85.5  --  86.2  --   PLT 231  --  320  --    < > = values in this interval not displayed.   Basic Metabolic Panel Recent Labs    11/20/20 0254 11/20/20 0841 11/21/20 0556 11/21/20 1550  11/22/20 0443 11/22/20 0759  NA 134*   < > 136   < > 132* 137  K 4.6   < > 3.9   < > 4.8 4.5  CL 99  --  103  --  101  --   CO2 25  --  23  --  19*  --   GLUCOSE 200*  --  153*  --  245*  --   BUN 19  --  17  --  27*  --   CREATININE 0.82  --  0.75  --  0.97  --   CALCIUM 9.2  --  9.3  --  8.7*  --   MG 2.0  --   --   --   --   --    < > = values in this interval not displayed.   Liver Function Tests Recent Labs    11/21/20 0556 11/22/20 0443  AST 27 20  ALT 42 43  ALKPHOS 96 91  BILITOT 0.6 0.9  PROT 6.5 6.7  ALBUMIN 3.3* 3.4*   No results for input(s): LIPASE, AMYLASE in the last 72  hours. Cardiac Enzymes No results for input(s): CKTOTAL, CKMB, CKMBINDEX, TROPONINI in the last 72 hours.  BNP: BNP (last 3 results) Recent Labs    11/15/20 1556  BNP 598.1*    ProBNP (last 3 results) No results for input(s): PROBNP in the last 8760 hours.   D-Dimer No results for input(s): DDIMER in the last 72 hours. Hemoglobin A1C No results for input(s): HGBA1C in the last 72 hours. Fasting Lipid Panel Recent Labs    11/22/20 0444  TRIG 209*   Thyroid Function Tests No results for input(s): TSH, T4TOTAL, T3FREE, THYROIDAB in the last 72 hours.  Invalid input(s): FREET3  Other results:   Imaging    DG CHEST PORT 1 VIEW  Result Date: 11/21/2020 CLINICAL DATA:  Check PICC line placement EXAM: PORTABLE CHEST 1 VIEW COMPARISON:  11/21/2020 FINDINGS: Endotracheal tube and gastric catheter are noted in satisfactory position. New right-sided PICC line is noted with the catheter tip at the cavoatrial junction. The overall inspiratory effort is poor with persistent atelectatic changes in the left mid lung. Right basilar atelectasis is noted as well. IMPRESSION: Stable appearance of the chest. New right-sided PICC line with the tip at the cavoatrial junction in satisfactory position. Electronically Signed   By: Inez Catalina M.D.   On: 11/21/2020 22:14   DG CHEST PORT 1 VIEW  Result Date: 11/21/2020 CLINICAL DATA:  Post bronchoscopy. EXAM: PORTABLE CHEST 1 VIEW COMPARISON:  One-view chest x-ray 11/18/2020 FINDINGS: Patient has been intubated. Endotracheal tube terminates 3.7 cm above the carina. Heart is enlarged. NG tube courses off the inferior border the film. Increasing interstitial and airspace opacities are present bilaterally. Small effusions are suspected. IMPRESSION: 1. Cardiomegaly with increasing interstitial and airspace opacities bilaterally, compatible with edema or atelectasis. 2. No pneumothorax. 3. Satisfactory positioning of endotracheal tube. Electronically Signed    By: San Morelle M.D.   On: 11/21/2020 16:05   DG Abd Portable 1V  Result Date: 11/21/2020 CLINICAL DATA:  Check gastric catheter placement EXAM: PORTABLE ABDOMEN - 1 VIEW COMPARISON:  None. FINDINGS: Gastric catheter is noted within the stomach. No obstructive changes are seen. No free air is noted. IMPRESSION: Gastric catheter within the stomach. Electronically Signed   By: Inez Catalina M.D.   On: 11/21/2020 16:57   [place] EKG SITE RITE  Result Date: 11/21/2020 If Site [place] [employer]  not attached, placement could not be confirmed due to current cardiac rhythm.  [place] EKG SITE RITE  Result Date: 11/21/2020 If Site Rite image not attached, placement could not be confirmed due to current cardiac rhythm.  DG C-ARM BRONCHOSCOPY  Result Date: 11/21/2020 C-ARM BRONCHOSCOPY: Fluoroscopy was utilized by the requesting physician.  No radiographic interpretation.     Medications:     Scheduled Medications: . aspirin  81 mg Oral Daily  . bisacodyl  10 mg Rectal Once  . chlorhexidine gluconate (MEDLINE KIT)  15 mL Mouth Rinse BID  . Chlorhexidine Gluconate Cloth  6 each Topical Daily  . digoxin  0.125 mg Per Tube Daily  . docusate  100 mg Per Tube BID  . feeding supplement (PROSource TF)  45 mL Per Tube BID  . feeding supplement (VITAL HIGH PROTEIN)  1,000 mL Per Tube Q24H  . furosemide  40 mg Intravenous Once  . insulin aspart  0-20 Units Subcutaneous Q4H  . insulin glargine  20 Units Subcutaneous BID  . ipratropium-albuterol  3 mL Nebulization QID  . mouth rinse  15 mL Mouth Rinse 10 times per day  . methylPREDNISolone (SOLU-MEDROL) injection  40 mg Intravenous Q12H  . nicotine  21 mg Transdermal Daily  . pantoprazole (PROTONIX) IV  40 mg Intravenous Daily  . polyethylene glycol  17 g Per Tube Daily  . rosuvastatin  20 mg Per Tube Daily  . sodium chloride flush  10-40 mL Intracatheter Q12H  . tranexamic acid  500 mg Nebulization Q8H    Infusions: . sodium chloride 10 mL/hr at 11/22/20  0600  . fentaNYL infusion INTRAVENOUS 175 mcg/hr (11/22/20 0600)  . norepinephrine (LEVOPHED) Adult infusion 7 mcg/min (11/22/20 0600)  . propofol (DIPRIVAN) infusion 40 mcg/kg/min (11/22/20 0948)    PRN Medications: acetaminophen, fentaNYL, HYDROcodone-acetaminophen, ondansetron (ZOFRAN) IV, sodium chloride flush   Assessment/Plan   1. 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 He has a wide LBBB. Echo this admission with basal-mid inferior akinesis, dyssynchrony from LBBB, EF 20-25%, moderate LVH, mildly decreased RV function.  RHC/LHC today with normal filling pressures, marginal CI (2.1 by thermodilution is likely more accurate than Fick); cath showed 50% proximal LAD and occluded RCA (chronic).  Extent of cardiomyopathy is not explained by coronary disease, suspect there is a nonischemic component that could potentially be from cardiac sarcoidosis. Cardiac MRI showed only nonspecific RV insertion site LGE (findings were not highly suggestive of cardiac sarcoidosis), but study was technically difficult.  Now on vent with hypotension post-bronch/biopsy 6/1.  - Wean NE down, MAP stable currently. Send co-ox.  - Continue digoxin 0.125, follow closely with 1st degree AVB.  Delene Loll, spironolactone, and Coreg on hold with low BP.  - Will not add SGLT2 inhibitor yet with uncontrolled diabetes.   - Will give Lasix 40 mg IV x 1 now to keep I/Os negative.  Follow CVP.  - Needs EP followup as outpatient for CRT-D placement (has had low EF since 2018, has wide LBBB).  - Will eventually need cardiac PET for assessment of cardiac involvement by sarcoidosis (will need to be done at [facility] as outpatient).  2. CAD: Presentation with CHF, not ACS.  LHC with 50% pLAD, totally occluded proximal RCA with R=>R collaterals (old occlusion).  Reviewed with interventional cardiology, would hold off on PCI attempt at this time as CTO.  - ASA 81 daily - Crestor 20 mg daily.  3.  OHS/OSA: Needs CPAP, on oxygen during the day.  4. Pulmonary: high resolution CT chest showed dense bilateral consolidations and enlarged hilar/mediastinal nodes. Concern for pulmonary sarcoidosis. Does not appear infectious.  Sarcoid may be unifying diagnosis for heart and lungs. ACE level elevated.  He has had bronch/biopsy, pending pathology report.  Intubated post-bronch with bronchospasm/hypotension.  - Solumedrol per CCM.  - Weaning sedation, attempt SBT again this afternoon.  5. Smoking: Needs to quit.   6. Type 2 diabetes: Per primary service.   CRITICAL CARE Performed by: Loralie Champagne  Total critical care time: 35 minutes  Critical care time was exclusive of separately billable procedures and treating other patients.  Critical care was necessary to treat or prevent imminent or life-threatening deterioration.  Critical care was time spent personally by me on the following activities: development of treatment plan with patient and/or surrogate as well as nursing, discussions with consultants, evaluation of patient's response to treatment, examination of patient, obtaining history from patient or surrogate, ordering and performing treatments and interventions, ordering and review of laboratory studies, ordering and review of radiographic studies, pulse oximetry and re-evaluation of patient's condition.   Length of Stay: 7  Loralie Champagne, MD  11/22/2020, 10:30 AM  Advanced Heart Failure Team Pager [phone] (M-F; 7a - 5p)  Please contact [facility] for night-coverage after hours (5p -7a ) and weekends on [email or web address]

## 2020-11-22 NOTE — Progress Notes (Signed)
Called and updated his brother  His brother is on his way in

## 2020-11-22 NOTE — Plan of Care (Signed)
  Problem: Clinical Measurements: Goal: Will remain free from infection Outcome: Progressing Goal: Diagnostic test results will improve Outcome: Progressing Goal: Respiratory complications will improve Outcome: Progressing Goal: Cardiovascular complication will be avoided Outcome: Progressing   Problem: Elimination: Goal: Will not experience complications related to bowel motility Outcome: Progressing Goal: Will not experience complications related to urinary retention Outcome: Progressing   Problem: Pain Managment: Goal: General experience of comfort will improve Outcome: Progressing   Problem: Safety: Goal: Ability to remain free from injury will improve Outcome: Progressing   Problem: Skin Integrity: Goal: Risk for impaired skin integrity will decrease Outcome: Progressing   Problem: Cardiac: Goal: Ability to achieve and maintain adequate cardiopulmonary perfusion will improve Outcome: Progressing   Problem: Safety: Goal: Non-violent Restraint(s) Outcome: Progressing   Problem: Respiratory: Goal: Ability to maintain a clear airway and adequate ventilation will improve Outcome: Progressing   Problem: Education: Goal: Knowledge of General Education information will improve Description: Including pain rating scale, medication(s)/side effects and non-pharmacologic comfort measures Outcome: Not Progressing   Problem: Health Behavior/Discharge Planning: Goal: Ability to manage health-related needs will improve Outcome: Not Progressing   Problem: Clinical Measurements: Goal: Ability to maintain clinical measurements within normal limits will improve Outcome: Not Progressing   Problem: Activity: Goal: Risk for activity intolerance will decrease Outcome: Not Progressing   Problem: Nutrition: Goal: Adequate nutrition will be maintained Outcome: Not Progressing   Problem: Coping: Goal: Level of anxiety will decrease Outcome: Not Progressing   Problem:  Education: Goal: Ability to demonstrate management of disease process will improve Outcome: Not Progressing Goal: Ability to verbalize understanding of medication therapies will improve Outcome: Not Progressing Goal: Individualized Educational Video(s) Outcome: Not Progressing   Problem: Activity: Goal: Capacity to carry out activities will improve Outcome: Not Progressing   Problem: Activity: Goal: Ability to tolerate increased activity will improve Outcome: Not Progressing   Problem: Role Relationship: Goal: Method of communication will improve Outcome: Not Progressing   Problem: Safety: Goal: Non-violent Restraint(s) Outcome: Completed/Met

## 2020-11-22 NOTE — Progress Notes (Signed)
Initial Nutrition Assessment  DOCUMENTATION CODES:   Morbid obesity  INTERVENTION:   Initiate tube feeding via OG tube: Vital AF 1.2 at 50 ml/h (1200 ml per day) Prosource TF 90 ml 5 times per day.  Provides 1840 kcal (2606 kcal total with propofol), 200 gm protein, 973 ml free water daily.  NUTRITION DIAGNOSIS:   Inadequate oral intake related to inability to eat as evidenced by NPO status.  GOAL:   Provide needs based on ASPEN/SCCM guidelines  MONITOR:   Vent status,TF tolerance  REASON FOR ASSESSMENT:   Ventilator,Consult Enteral/tube feeding initiation and management  ASSESSMENT:   45 yo male admitted with acute on chronic CHF. PMH includes asthma, CHF, HTN, OSA, DM.  S/P heart cath and coronary angiography 5/31.  S/P bronchoscopy 6/1. Decision made to transfer to the ICU to extubate onto BiPAP. Prior to transfer to the ICU, patient was on a heart healthy CHO modified diet consuming 100% of meals.  Discussed patient in ICU rounds and with RN today. Unable to extubate this morning. Received MD Consult for TF initiation and management. Patient has an OG tube.  Patient is currently intubated on ventilator support MV: 12 L/min Temp (24hrs), Avg:98.3 F (36.8 C), Min:97.7 F (36.5 C), Max:98.9 F (37.2 C)  Propofol: 29 ml/hr providing 766 kcal from lipid.  Labs reviewed.  CBG: 270-286  Medications reviewed and include colace, lasix, novolog, lantus, solumedrol, protonix, miralax, levophed, propofol.  Usual weights reviewed. Patient has lost from 180.5 kg on 06/21/20 to current weight of 162.4 kg. 10% weight loss in 6 months is severe, however, suspect some of this is related to fluid status with CHF.   NUTRITION - FOCUSED PHYSICAL EXAM:  Flowsheet Row Most Recent Value  Orbital Region No depletion  Upper Arm Region No depletion  Thoracic and Lumbar Region No depletion  Buccal Region Unable to assess  Temple Region No depletion  Clavicle Bone Region  No depletion  Clavicle and Acromion Bone Region No depletion  Scapular Bone Region Unable to assess  Dorsal Hand No depletion  Patellar Region No depletion  Anterior Thigh Region No depletion  Posterior Calf Region No depletion  Edema (RD Assessment) None  Hair Reviewed  Eyes Unable to assess  Mouth Unable to assess  Skin Reviewed  Nails Reviewed       Diet Order:   Diet Order            Diet NPO time specified Except for: Sips with Meds  Diet effective ____                 EDUCATION NEEDS:   Not appropriate for education at this time  Skin:  Skin Assessment: Reviewed RN Assessment  Last BM:  5/30  Height:   Ht Readings from Last 1 Encounters:  11/16/20 6\' 3"  (1.905 m)    Weight:   Wt Readings from Last 1 Encounters:  11/22/20 (!) 162.4 kg    Ideal Body Weight:  89.1 kg  BMI:  Body mass index is 44.75 kg/m.  Estimated Nutritional Needs:   Kcal:  01/22/21  Protein:  180-220 gm  Fluid:  1.8-2 L    [id number], RD, LDN, CNSC Please refer to Amion for contact information.

## 2020-11-23 ENCOUNTER — Encounter (HOSPITAL_COMMUNITY): Payer: Self-pay

## 2020-11-23 LAB — CYCLIC CITRUL PEPTIDE ANTIBODY, IGG/IGA: CCP Antibodies IgG/IgA: 3 units (ref 0–19)

## 2020-11-23 LAB — GLUCOSE, CAPILLARY
Glucose-Capillary: 175 mg/dL — ABNORMAL HIGH (ref 70–99)
Glucose-Capillary: 251 mg/dL — ABNORMAL HIGH (ref 70–99)
Glucose-Capillary: 279 mg/dL — ABNORMAL HIGH (ref 70–99)
Glucose-Capillary: 290 mg/dL — ABNORMAL HIGH (ref 70–99)
Glucose-Capillary: 298 mg/dL — ABNORMAL HIGH (ref 70–99)
Glucose-Capillary: 300 mg/dL — ABNORMAL HIGH (ref 70–99)
Glucose-Capillary: 377 mg/dL — ABNORMAL HIGH (ref 70–99)

## 2020-11-23 LAB — BASIC METABOLIC PANEL
Anion gap: 12 (ref 5–15)
BUN: 28 mg/dL — ABNORMAL HIGH (ref 6–20)
CO2: 18 mmol/L — ABNORMAL LOW (ref 22–32)
Calcium: 7.3 mg/dL — ABNORMAL LOW (ref 8.9–10.3)
Chloride: 96 mmol/L — ABNORMAL LOW (ref 98–111)
Creatinine, Ser: 0.71 mg/dL (ref 0.61–1.24)
GFR, Estimated: >60 mL/min (ref >60)
Glucose, Bld: 349 mg/dL — ABNORMAL HIGH (ref 70–99)
Potassium: 4.8 mmol/L (ref 3.5–5.1)
Sodium: 126 mmol/L — ABNORMAL LOW (ref 135–145)

## 2020-11-23 LAB — QUANTIFERON-TB GOLD PLUS: QuantiFERON-TB Gold Plus: NEGATIVE

## 2020-11-23 LAB — COOXEMETRY PANEL
Carboxyhemoglobin: 1 % (ref 0.5–1.5)
Carboxyhemoglobin: 1.2 % (ref 0.5–1.5)
Methemoglobin: 1.2 % (ref 0.0–1.5)
Methemoglobin: 0.9 % (ref 0.0–1.5)
O2 Saturation: 89.2 %
O2 Saturation: 83 %
Total hemoglobin: 17.1 g/dL — ABNORMAL HIGH (ref 12.0–16.0)
Total hemoglobin: 17.6 g/dL — ABNORMAL HIGH (ref 12.0–16.0)

## 2020-11-23 LAB — PHOSPHORUS: Phosphorus: 3 mg/dL (ref 2.5–4.6)

## 2020-11-23 LAB — MAGNESIUM: Magnesium: 2.3 mg/dL (ref 1.7–2.4)

## 2020-11-23 LAB — QUANTIFERON-TB GOLD PLUS (RQFGPL)
QuantiFERON Mitogen Value: >10 IU/mL
QuantiFERON Nil Value: 0.09 IU/mL
QuantiFERON TB1 Ag Value: 0.08 IU/mL
QuantiFERON TB2 Ag Value: 0.09 IU/mL

## 2020-11-23 LAB — MRSA PCR SCREENING: MRSA by PCR: NEGATIVE

## 2020-11-23 MED ORDER — INSULIN ASPART 100 UNIT/ML IJ SOLN
10.0000 [IU] | INTRAMUSCULAR | Status: DC
  Administered 2020-11-23 – 2020-11-24 (×6): 10 [IU] via SUBCUTANEOUS

## 2020-11-23 MED ORDER — ENOXAPARIN SODIUM 80 MG/0.8ML IJ SOSY
80.0000 mg | PREFILLED_SYRINGE | INTRAMUSCULAR | Status: DC
  Administered 2020-11-23 – 2020-11-27 (×5): 80 mg via SUBCUTANEOUS
  Filled 2020-11-23 (×6): qty 0.8

## 2020-11-23 MED ORDER — ORAL CARE MOUTH RINSE
15.0000 mL | Freq: Two times a day (BID) | OROMUCOSAL | Status: DC
  Administered 2020-11-23 – 2020-11-28 (×9): 15 mL via OROMUCOSAL

## 2020-11-23 MED ORDER — HYDROCODONE-ACETAMINOPHEN 5-325 MG PO TABS
1.0000 | ORAL_TABLET | ORAL | Status: DC | PRN
  Administered 2020-11-28: 1 via ORAL
  Filled 2020-11-23: qty 1

## 2020-11-23 MED ORDER — DIGOXIN 125 MCG PO TABS
0.1250 mg | ORAL_TABLET | Freq: Every day | ORAL | Status: DC
  Administered 2020-11-24 – 2020-11-28 (×5): 0.125 mg via ORAL
  Filled 2020-11-23 (×5): qty 1

## 2020-11-23 MED ORDER — ROSUVASTATIN CALCIUM 20 MG PO TABS
20.0000 mg | ORAL_TABLET | Freq: Every day | ORAL | Status: DC
  Administered 2020-11-24 – 2020-11-28 (×5): 20 mg via ORAL
  Filled 2020-11-23 (×5): qty 1

## 2020-11-23 MED ORDER — FUROSEMIDE 10 MG/ML IJ SOLN
60.0000 mg | Freq: Two times a day (BID) | INTRAMUSCULAR | Status: DC
  Administered 2020-11-23 – 2020-11-25 (×5): 60 mg via INTRAVENOUS
  Filled 2020-11-23 (×5): qty 6

## 2020-11-23 MED ORDER — FOLIC ACID 1 MG PO TABS
1.0000 mg | ORAL_TABLET | Freq: Every day | ORAL | Status: DC
  Administered 2020-11-23 – 2020-11-28 (×6): 1 mg via ORAL
  Filled 2020-11-23 (×6): qty 1

## 2020-11-23 MED ORDER — FUROSEMIDE 10 MG/ML IJ SOLN: 40.0000 mg | Freq: Two times a day (BID) | INTRAMUSCULAR | Status: DC

## 2020-11-23 MED ORDER — SULFAMETHOXAZOLE-TRIMETHOPRIM 800-160 MG PO TABS
1.0000 | ORAL_TABLET | ORAL | Status: DC
  Administered 2020-11-23 – 2020-11-26 (×2): 1 via ORAL
  Filled 2020-11-23 (×3): qty 1

## 2020-11-23 MED ORDER — IPRATROPIUM-ALBUTEROL 0.5-2.5 (3) MG/3ML IN SOLN: 3.0000 mL | RESPIRATORY_TRACT | Status: DC | PRN

## 2020-11-23 MED ORDER — METHOTREXATE 2.5 MG PO TABS
15.0000 mg | ORAL_TABLET | ORAL | Status: DC
  Administered 2020-11-23: 15 mg via ORAL
  Filled 2020-11-23: qty 6

## 2020-11-23 MED ORDER — ASPIRIN EC 81 MG PO TBEC
81.0000 mg | DELAYED_RELEASE_TABLET | Freq: Every day | ORAL | Status: DC
  Administered 2020-11-24 – 2020-11-28 (×5): 81 mg via ORAL
  Filled 2020-11-23 (×5): qty 1

## 2020-11-23 MED ORDER — INSULIN GLARGINE 100 UNIT/ML ~~LOC~~ SOLN
30.0000 [IU] | Freq: Two times a day (BID) | SUBCUTANEOUS | Status: DC
  Administered 2020-11-23 – 2020-11-24 (×3): 30 [IU] via SUBCUTANEOUS
  Filled 2020-11-23 (×4): qty 0.3

## 2020-11-23 NOTE — Progress Notes (Signed)
Attending:    Subjective: Complex case.  Presented with dyspnea, found to have evidence of systolic heart failure. Since his hosptalization he had a bronchoscopy to evaluation of possible diffuse parenchymal lung disease.  He was treated with pneumonia.  He remains mechanically ventilated since the bronchoscopy on 6/1.  Remains on vasopressors.    Objective: Vitals:   11/23/20 0536 11/23/20 0600 11/23/20 0700 11/23/20 0735  BP:  112/73    Pulse:  64 63   Resp:  18 18   Temp:    97.7 F (36.5 C)  TempSrc:    Oral  SpO2: 99% 98% 98%   Weight:      Height:       Vent Mode: PRVC FiO2 (%):  [30 %] 30 % Set Rate:  [18 bmp] 18 bmp Vt Set:  [191 mL] 670 mL PEEP:  [5 cmH20] 5 cmH20 Plateau Pressure:  [17 cmH20-20 cmH20] 19 cmH20  Intake/Output Summary (Last 24 hours) at 11/23/2020 0935 Last data filed at 11/23/2020 0700 Gross per 24 hour  Intake 3233.51 ml  Output 3040 ml  Net 193.51 ml    General: Morbidly obese male in bed on vent HENT: NCAT ETT in place PULM: CTA B, vent supported breathing CV: RRR, no mgr GI: BS+, soft, nontender MSK: normal bulk and tone Neuro: sedated on vent, will wake somewhat, open eyes, spontaneous movements     CBC    Component Value Date/Time   WBC 12.4 (H) 11/22/2020 0443   RBC 6.73 (H) 11/22/2020 0443   HGB 19.0 (H) 11/22/2020 0759   HGB 15.6 11/24/2018 1204   HCT 56.0 (H) 11/22/2020 0759   HCT 48.9 11/24/2018 1204   PLT 320 11/22/2020 0443   PLT 562 (H) 11/24/2018 1204   MCV 86.2 11/22/2020 0443   MCV 88 11/24/2018 1204   MCH 28.1 11/22/2020 0443   MCHC 32.6 11/22/2020 0443   RDW 14.0 11/22/2020 0443   RDW 13.9 11/24/2018 1204   LYMPHSABS 1.4 11/22/2020 0443   LYMPHSABS 2.0 11/24/2018 1204   MONOABS 0.5 11/22/2020 0443   EOSABS 0.0 11/22/2020 0443   EOSABS 0.4 11/24/2018 1204   BASOSABS 0.0 11/22/2020 0443   BASOSABS 0.1 11/24/2018 1204    BMET    Component Value Date/Time   NA 126 (L) 11/23/2020 0450   NA 137 11/24/2018  1204   K 4.8 11/23/2020 0450   CL 96 (L) 11/23/2020 0450   CO2 18 (L) 11/23/2020 0450   GLUCOSE 349 (H) 11/23/2020 0450   BUN 28 (H) 11/23/2020 0450   BUN 15 11/24/2018 1204   CREATININE 0.71 11/23/2020 0450   CALCIUM 7.3 (L) 11/23/2020 0450   GFRNONAA >60 11/23/2020 0450   GFRAA >60 12/23/2018 1232    CXR images from 6/1 personally reviewed, ETT in place, clear on left, atelectasis vs infiltrate noted on left  Impression/Plan: Acute respiratory failure with hypoxemia in setting of bronchospasm and underlying sarcoidosis: Continue full vent support with daily SBT, continue steroids, bronchodilators, pulmonary; suspect we can extubate today, wean to extubate now  Acute metabolic encephalopathy> stop fentanyl infusion now, likely extubation  Pulmonary sarcoidosis with cardiac involvement> need to follow up cultures, but clinical picture most consistent with sarcoidosis; will start methotrexate 15mg  weekly now, continue steroids, add bactrim for PCP prophylaxis  Decompensated systolic heart failure> continue to wean off levophed, monitor daily SvO2, appreciate cardiology support; lasix today  DM2, baseline Hgb A1c 11, poorly controlled blood sugar> Monitor glucose and SSI.  Increase lantus to  30 bid, add 10 u q4h tube feeding coverage and SSI  Hyponatremia> stop saline, work up today  Hypocalcemia > replace today  My cc time 33 minutes  Heber Sabana Eneas, MD  PCCM Pager: [phone] Cell: [phone] After 3pm or if no response, call [phone]

## 2020-11-23 NOTE — Progress Notes (Addendum)
Patient ID: Marc Wright, male   DOB: 1975-08-01, 45 y.o.   MRN: [id number]     Advanced Heart Failure Rounding Note  PCP-Cardiologist: None   Subjective:    Developed bronchospasm/hypotension with bronchoscopy/biopsy on 6/1, intubated. Started on Solumedrol.   Sedated on vent. FiO2 30%. CVP 17  On norepi 5 mcg. CO-OX 59%   Cardiac MRI: 1. Bilateral airspace disease, see dedicated chest CT from earlier this admission. 2. Severely dilated left ventricle with marked septal-lateral dyssynchrony as well as inferior severe hypokinesis and septal severe hypokinesis. Difficult to quantify EF due to dyssynchrony, but calculated at 18%. 3. Mildly dilated RV with severe systolic dysfunction, EF 95%. 4. Poor delayed enhancement images with significant artifact. Probably inferior RV insertion site LGE (nonspecific).  RHC/LHC: Coronary Findings   Diagnostic Dominance: Right  Left Anterior Descending  Prox LAD lesion is 50% stenosed.  Right Coronary Artery  Prox RCA lesion is 100% stenosed. There are weak right-right collaterals, no left-right collaterals.   Intervention   No interventions have been documented.  Right Heart  Right Heart Pressures RHC Procedural Findings: Hemodynamics (mmHg) RA mean 2 RV 31/3 PA 30/15, mean 21 PCWP mean 8 LV 101/15 AO 100/71  Oxygen saturations: PA 60% AO 93%  Cardiac Output (Fick) 4.6  Cardiac Index (Fick) 1.64   Cardiac Output (Thermo) 5.98  Cardiac Index (Thermo) 2.13      Objective:   Weight Range: (!) 162 kg Body mass index is 44.64 kg/m.   Vital Signs:   Temp:  [97.7 F (36.5 C)-98.4 F (36.9 C)] 97.7 F (36.5 C) (06/03 0735) Pulse Rate:  [63-108] 63 (06/03 0700) Resp:  [18-32] 18 (06/03 0700) BP: (102-125)/(64-77) 112/73 (06/03 0600) SpO2:  [88 %-99 %] 98 % (06/03 0700) Arterial Line BP: (81-136)/(49-75) 129/63 (06/03 0700) FiO2 (%):  [30 %] 30 % (06/03 0536) Weight:  [162 kg] 162 kg (06/03 0448) Last BM Date:  11/22/20  Weight change: Filed Weights   11/21/20 0000 11/22/20 0630 11/23/20 0448  Weight: (!) 161.2 kg (!) 162.4 kg (!) 162 kg    Intake/Output:   Intake/Output Summary (Last 24 hours) at 11/23/2020 0745 Last data filed at 11/23/2020 0700 Gross per 24 hour  Intake 3233.51 ml  Output 2240 ml  Net 993.51 ml      Physical Exam  CVP 15-16   General:  Intubated/sedated  HEENT: ETT Neck: supple. JVP difficult to assess. . Carotids 2+ bilat; no bruits. No lymphadenopathy or thryomegaly appreciated. Cor: PMI nondisplaced. Regular rate & rhythm. No rubs, gallops or murmurs. Lungs: clear Abdomen: soft, nontender, nondistended. No hepatosplenomegaly. No bruits or masses. Good bowel sounds. Extremities: no cyanosis, clubbing, rash, edema. RUE PICC  Neuro: Sedated on vent.    Telemetry   SR with 1st degree block and occasional PVCs.    Labs    CBC Recent Labs    11/21/20 0556 11/21/20 1550 11/22/20 0443 11/22/20 0759  WBC 6.1  --  12.4*  --   NEUTROABS 3.7  --  10.5*  --   HGB 19.4*   < > 18.9* 19.0*  HCT 58.5*   < > 58.0* 56.0*  MCV 85.5  --  86.2  --   PLT 231  --  320  --    < > = values in this interval not displayed.   Basic Metabolic Panel Recent Labs    11/22/20 0443 11/22/20 0759 11/22/20 1107 11/22/20 1635 11/23/20 0450  NA 132* 137  --   --  126*  K 4.8 4.5  --   --  4.8  CL 101  --   --   --  96*  CO2 19*  --   --   --  18*  GLUCOSE 245*  --   --   --  349*  BUN 27*  --   --   --  28*  CREATININE 0.97  --   --   --  0.71  CALCIUM 8.7*  --   --   --  7.3*  MG  --   --    < > 1.4* 2.3  PHOS  --   --    < > 2.2* 3.0   < > = values in this interval not displayed.   Liver Function Tests Recent Labs    11/21/20 0556 11/22/20 0443  AST 27 20  ALT 42 43  ALKPHOS 96 91  BILITOT 0.6 0.9  PROT 6.5 6.7  ALBUMIN 3.3* 3.4*   No results for input(s): LIPASE, AMYLASE in the last 72 hours. Cardiac Enzymes No results for input(s): CKTOTAL, CKMB,  CKMBINDEX, TROPONINI in the last 72 hours.  BNP: BNP (last 3 results) Recent Labs    11/15/20 1556  BNP 598.1*    ProBNP (last 3 results) No results for input(s): PROBNP in the last 8760 hours.   D-Dimer No results for input(s): DDIMER in the last 72 hours. Hemoglobin A1C No results for input(s): HGBA1C in the last 72 hours. Fasting Lipid Panel Recent Labs    11/22/20 0444  TRIG 209*   Thyroid Function Tests No results for input(s): TSH, T4TOTAL, T3FREE, THYROIDAB in the last 72 hours.  Invalid input(s): FREET3  Other results:   Imaging    No results found.   Medications:     Scheduled Medications: . aspirin  81 mg Per Tube Daily  . chlorhexidine gluconate (MEDLINE KIT)  15 mL Mouth Rinse BID  . Chlorhexidine Gluconate Cloth  6 each Topical Daily  . digoxin  0.125 mg Per Tube Daily  . docusate  100 mg Per Tube BID  . feeding supplement (PROSource TF)  90 mL Per Tube 5 X Daily  . insulin aspart  0-20 Units Subcutaneous Q4H  . insulin glargine  20 Units Subcutaneous BID  . ipratropium-albuterol  3 mL Nebulization QID  . mouth rinse  15 mL Mouth Rinse 10 times per day  . methylPREDNISolone (SOLU-MEDROL) injection  40 mg Intravenous Q12H  . nicotine  21 mg Transdermal Daily  . pantoprazole (PROTONIX) IV  40 mg Intravenous Daily  . polyethylene glycol  17 g Per Tube Daily  . rosuvastatin  20 mg Per Tube Daily  . sodium chloride flush  10-40 mL Intracatheter Q12H  . tranexamic acid  500 mg Nebulization Q8H    Infusions: . sodium chloride 10 mL/hr at 11/23/20 0700  . feeding supplement (VITAL AF 1.2 CAL) 1,000 mL (11/22/20 1241)  . fentaNYL infusion INTRAVENOUS 175 mcg/hr (11/23/20 0700)  . norepinephrine (LEVOPHED) Adult infusion 5 mcg/min (11/23/20 0700)  . propofol (DIPRIVAN) infusion 35 mcg/kg/min (11/23/20 0700)    PRN Medications: fentaNYL, HYDROcodone-acetaminophen, sodium chloride flush   Assessment/Plan   1. 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 He has a wide LBBB. Echo this admission with basal-mid inferior akinesis, dyssynchrony from LBBB, EF 20-25%, moderate LVH, mildly decreased RV function.  RHC/LHC today with normal filling pressures, marginal CI (2.1 by thermodilution is likely more accurate than Fick); cath showed 50%  proximal LAD and occluded RCA (chronic).  Extent of cardiomyopathy is not explained by coronary disease, suspect there is a nonischemic component that could potentially be from cardiac sarcoidosis. Cardiac MRI showed only nonspecific RV insertion site LGE (findings were not highly suggestive of cardiac sarcoidosis), but study was technically difficult.  Now on vent with hypotension post-bronch/biopsy 6/1.  - Slow wean NE. CO-OX 89%. Repeat.  - CVP 17.  Given  Give 40 mg IV lasix twice daily.  - Continue digoxin 0.125, follow closely with 1st degree AVB.  Delene Loll, spironolactone, and Coreg on hold with low BP.  - Will not add SGLT2 inhibitor yet with uncontrolled diabetes.  - Needs EP eventually for CRT-D placement (has had low EF since 2018, has wide LBBB).  - Will eventually need cardiac PET for assessment of cardiac involvement by sarcoidosis (will need to be done at [facility] as outpatient).  2. CAD: Presentation with CHF, not ACS.  LHC with 50% pLAD, totally occluded proximal RCA with R=>R collaterals (old occlusion).  Reviewed with interventional cardiology, would hold off on PCI attempt at this time as CTO.  - ASA 81 daily - Crestor 20 mg daily.  3. OHS/OSA: Needs CPAP, on oxygen during the day. Once extubated.  4. Sarcoidosis: High resolution CT chest showed dense bilateral consolidations and enlarged hilar/mediastinal nodes.  Does not appear infectious.  Sarcoid is likely unifying diagnosis for heart and lungs. ACE level elevated.  He has had bronch/biopsy, path suggestive of sarcoidosis.  Intubated post-bronch with bronchospasm/hypotension.  - Solumedrol per CCM.  5.  Smoking: Needs to quit.   6. Type 2 diabetes: Per primary service.  7. NSVT/PVCs Mag/K stable.  -Hold off on AA for now. EP will need to see as an oupatient.    Length of Stay: [place], NP  11/23/2020, 7:45 AM  Advanced Heart Failure Team Pager [phone] (M-F; 7a - 5p)  Please contact [facility] for night-coverage after hours (5p -7a ) and weekends on [email or web address]  Patient seen with NP, agree with the above note.   Failed vent wean yesterday due to agitation/biting tube. Remains on NE 4, MAP stable.  CVP 15-16.   General: Sedated on vent.  Neck: JVP 12 cm, no thyromegaly or thyroid nodule.  Lungs: Clear to auscultation bilaterally with normal respiratory effort. CV: Nondisplaced PMI.  Heart regular S1/S2, no S3/S4, no murmur.  1+ ankle edema. Abdomen: Soft, nontender, no hepatosplenomegaly, no distention.  Skin: Intact without lesions or rashes.  Neurologic: sedated, awakens agitated per nursing.  Extremities: No clubbing or cyanosis.  HEENT: Normal.   Wean NE slowly, plan vent wean again today per CCM. Suspect he can come off NE when sedation is weaned off.   Volume overloaded, will give Lasix 60 mg IV bid today.  Continue digoxin.   Biopsy from bronchoscopy is suggestive of sarcoidosis.  He has pulmonary sarcoidosis, and suspect sarcoidosis plays a role in his cardiac disease as well with conduction abnormalities and CHF, but cardiac MRI not definitive (difficult study).  Should have cardiac PET as outpatient to confirm cardiac involvement.   - Started on steroids, will need long-term prednisone taper and likely eventual MTX.   With long-standing low EF, LBBB, and suspected cardiac sarcoidosis, may be worthwhile to have EP implant CRT-D device prior to discharge.  Will address after extubation.   CRITICAL CARE Performed by: Loralie Champagne  Total critical care time: 35 minutes  Critical care time was exclusive of separately billable procedures and  treating other  patients.  Critical care was necessary to treat or prevent imminent or life-threatening deterioration.  Critical care was time spent personally by me on the following activities: development of treatment plan with patient and/or surrogate as well as nursing, discussions with consultants, evaluation of patient's response to treatment, examination of patient, obtaining history from patient or surrogate, ordering and performing treatments and interventions, ordering and review of laboratory studies, ordering and review of radiographic studies, pulse oximetry and re-evaluation of patient's condition.  Loralie Champagne 11/23/2020 8:26 AM

## 2020-11-23 NOTE — Procedures (Signed)
Extubation Procedure Note  Patient Details:   Name: Marc Wright DOB: 10-23-1975 MRN: [id number]   Airway Documentation:    Vent end date: 11/23/20 Vent end time: 1126   Evaluation  O2 sats: transiently fell during during procedure and currently acceptable Complications: No apparent complications Patient did tolerate procedure well. Bilateral Breath Sounds: Clear,Diminished   Yes   Pt extubated to 4L [place] per MD order. Positive cuff leak noted prior to extubation. Pt has strong productive cough and encouraged to use [place] to clear secretions. Pt able to speak post extubation. RT will continue to monitor.   Carolan Shiver 11/23/2020, 11:26 AM

## 2020-11-23 NOTE — Evaluation (Signed)
Clinical/Bedside Swallow Evaluation Patient Details  Name: Marc Wright MRN: [id number] Date of Birth: 1975-11-04  Today's Date: 11/23/2020 Time: SLP Start Time (ACUTE ONLY): 1525 SLP Stop Time (ACUTE ONLY): 1538 SLP Time Calculation (min) (ACUTE ONLY): 13.03 min  Past Medical History:  Past Medical History:  Diagnosis Date  . Asthma   . CHF (congestive heart failure) (HCC)   . Diabetes mellitus without complication (HCC)   . OSA on CPAP   . Pneumonia 06/2016   Past Surgical History:  Past Surgical History:  Procedure Laterality Date  . BRONCHIAL BIOPSY  11/21/2020   Procedure: BRONCHIAL BIOPSIES;  Surgeon: Lorin Glass, MD;  Location: [facility] ENDOSCOPY;  Service: Pulmonary;;  . BRONCHIAL NEEDLE ASPIRATION BIOPSY  11/21/2020   Procedure: BRONCHIAL NEEDLE ASPIRATION BIOPSIES;  Surgeon: Lorin Glass, MD;  Location: [facility] ENDOSCOPY;  Service: Pulmonary;;  . I & D EXTREMITY    . RIGHT/LEFT HEART CATH AND CORONARY ANGIOGRAPHY N/A 11/20/2020   Procedure: RIGHT/LEFT HEART CATH AND CORONARY ANGIOGRAPHY;  Surgeon: Laurey Morale, MD;  Location: [facility] INVASIVE CV LAB;  Service: Cardiovascular;  Laterality: N/A;  . SKIN GRAFT    . VIDEO BRONCHOSCOPY WITH ENDOBRONCHIAL ULTRASOUND N/A 11/21/2020   Procedure: VIDEO BRONCHOSCOPY WITH ENDOBRONCHIAL ULTRASOUND;  Surgeon: Lorin Glass, MD;  Location: [facility] ENDOSCOPY;  Service: Pulmonary;  Laterality: N/A;   HPI:  Pt is a 46 y/o male who presented to the ED after with 1 week of worsening dyspnea on exertion and associated nasal congestion.  Pt found to have evidence of systolic heart failure. EBUS bronchoscopy 6/1 to evaluate possible diffuse parenchymal lung disease and revealed multiple enlarged rubbery lymph nodes throughout mediastinum and hila, small thick secretions throughout. ETT 6/1-6/3 (1126). CXR 6/1: overall inspiratory effort is poor  with persistent atelectatic changes in the left mid lung. Right  basilar atelectasis is noted as well. PMH: chronic  systolic CHF, HTN, OSA on CPAP, DM2, tobacco abuse, seasonal allergies.   Assessment / Plan / Recommendation Clinical Impression  Pt was seen for bedside swallow evaluation and he denied a history of dysphagia. Oral mechanism exam was [facility] and he presented with adequate, natural dentition. Pt's swallow was challenged with large boluses, speaking while eating, consecutive swallows, and a suboptimal, recline position. He tolerated all solids, liquids, and meds given by RN with water without signs or symptoms of oropharyngeal dysphagia and he has passed the [place] swallow screen. A regular texture diet with thin liquids is recommended at this time and further skilled SLP services are not clinically indicated for swallowing. SLP Visit Diagnosis: Dysphagia, unspecified (R13.10)    Aspiration Risk  No limitations    Diet Recommendation Regular;Thin liquid   Liquid Administration via: Cup;Straw Medication Administration: Whole meds with liquid Supervision: Patient able to self feed Postural Changes: Seated upright at 90 degrees    Other  Recommendations Oral Care Recommendations: Oral care BID   Follow up Recommendations None      Frequency and Duration            Prognosis Prognosis for Safe Diet Advancement: Good      Swallow Study   General Date of Onset: 11/23/20 HPI: Pt is a 45 y/o male who presented to the ED after with 1 week of worsening dyspnea on exertion and associated nasal congestion.  Pt found to have evidence of systolic heart failure. EBUS bronchoscopy 6/1 to evaluate possible diffuse parenchymal lung disease and revealed multiple enlarged rubbery lymph nodes throughout mediastinum and hila, small  thick secretions throughout. ETT 6/1-6/3 (1126). CXR 6/1: overall inspiratory effort is poor  with persistent atelectatic changes in the left mid lung. Right  basilar atelectasis is noted as well. PMH: chronic systolic CHF, HTN, OSA on CPAP, DM2, tobacco abuse, seasonal  allergies. Type of Study: Bedside Swallow Evaluation Previous Swallow Assessment: none Diet Prior to this Study: NPO Temperature Spikes Noted: No Respiratory Status: Nasal cannula History of Recent Intubation: Yes Length of Intubations (days): 2 days Date extubated: 11/23/20 Behavior/Cognition: Alert;Cooperative;Pleasant mood Oral Cavity Assessment: Within Functional Limits Oral Care Completed by SLP: No Oral Cavity - Dentition: Adequate natural dentition Vision: Functional for self-feeding Self-Feeding Abilities: Able to feed self Patient Positioning: Upright in bed Baseline Vocal Quality: Normal (mildly hoarse at onset which improved as the evaluation progressed) Volitional Cough: Strong Volitional Swallow: Able to elicit    Oral/Motor/Sensory Function Overall Oral Motor/Sensory Function: Within functional limits   Ice Chips Ice chips: Within functional limits Presentation: Spoon   Thin Liquid Thin Liquid: Within functional limits Presentation: Straw    Nectar Thick Nectar Thick Liquid: Not tested   Honey Thick Honey Thick Liquid: Not tested   Puree Puree: Within functional limits Presentation: Spoon   Solid    [name] [name], MS, CCC-SLP Acute Rehabilitation Services Office number [phone] Pager [phone] Solid: Within functional limits Presentation: Self Fed      [name] 11/23/2020,3:42 PM

## 2020-11-23 NOTE — Progress Notes (Signed)
NAME:  Marc Wright, MRN:  [id number], DOB:  1976-03-25, LOS: 8 ADMISSION DATE:  11/15/2020, CONSULTATION DATE:  11/23/20 REFERRING MD:  TRH CHIEF COMPLAINT: Shortness of breath  BRIEF  45 year old whom we are seeing in consultation for evaluation of abnormal chest imaging hypoxemia for concern of/ rule out pulmonary / cardiac sarcoidosis   Patient presented with 1 week history of worsening dyspnea on exertion.  Associated with increased nasal congestion.  Developed shortness of breath at rest.  Shortness of breath worse when lying down, improves when sitting up.  Noted increasing lower extremity swelling more so on the left leg than the right over the same timeframe.  Denies respiratory issues with pollen earlier in the spring.  Denies any wheezing.  Worsened over time which prompted presentation to ED.  There, BNP elevated.  Chest x-ray congested.  CT scan with bilateral, right greater than left pleural effusions with mild interlobular septal thickening.  Had several episodes of infiltrate most notably in bilateral lower lobes.  Reviewed CT scan 2020, patient with similar appearing infiltrates although more prominent this admission.  No fever or chills.  Does have mild cough, sounds almost blood-tinged.  Reviewed labs extensively no leukocytosis, no fever here, procalcitonin negative x2.  Received Lasix.  He is feeling better.  Weaning oxygen.  Notes he desats when he sleeps but has known sleep apnea.    Underwent bronchoscopy with EBUS and cardiac MRI 6/1 Cardiomyopathy-ejection fraction of 20 to 25% on echo, 17-18% on cardiac MRI  Decompensated postprocedure-currently on ventilator, requiring pressors   Pertinent  Medical History  CHF secondary to ischemic/nonischemic cardiomyopathy, HTN, OSA, asthma, DMT2, tobacco use, seasonal allergies  Significant Hospital Events: Including procedures, antibiotic start and stop dates in addition to other pertinent events   . 5/25-10/2024 admitted to  hospital, began diuresed, got dose of Solu-Medrol 60 mg IV x1 . 5/27 pulmonary consulted . 5/30 Most respiratory symptoms resolved or markedly improved . 5/31  -s/p R/LHC w/ Dr. Shirlee Latch. ok for patient to undergo bronch/EBUS under [facility] while as inpatient based on cath results. . 6/1 Underwent bronchoscopy with EBUS, on vent   Interim History / Subjective:  No acute events overnight, afebrile and vital signs stable on 5 mcg of norepinephrine overnight.  Was on PEEP of 5 and FiO2 of 30% overnight.  Sedated this morning on exam.   Objective   Blood pressure 112/73, pulse 63, temperature 98.4 F (36.9 C), temperature source Oral, resp. rate 18, height 6\' 3"  (1.905 m), weight (!) 162 kg, SpO2 98 %. CVP:  [0 mmHg-12 mmHg] 12 mmHg  Vent Mode: PRVC FiO2 (%):  [30 %] 30 % Set Rate:  [18 bmp] 18 bmp Vt Set:  [670 mL] 670 mL PEEP:  [5 cmH20] 5 cmH20 Plateau Pressure:  [17 cmH20-20 cmH20] 19 cmH20   Intake/Output Summary (Last 24 hours) at 11/23/2020 0726 Last data filed at 11/23/2020 0700 Gross per 24 hour  Intake 3233.51 ml  Output 2240 ml  Net 993.51 ml   Filed Weights   11/21/20 0000 11/22/20 0630 11/23/20 0448  Weight: (!) 161.2 kg (!) 162.4 kg (!) 162 kg    Examination: General: Morbidly obese, does not appear to be in distress HEENT: Moist oral mucosa, endotracheal tube in place Neuro: Sedated, not able to follow commands CV: S1-S2 appreciated PULM: mild rhonchi, normal WOB GI: soft, exam limited by body habitus  Extremities: Warm/dry, trace LE edema, SCDs on  Skin: no rashes    Labs/imaging that I  havepersonally reviewed  (right click and "Reselect all SmartList Selections" daily)  BMET  5/27 TTE LVEF 20-25%, akinesis of basal to mid spetum and basal to mid inferior wall-> consider infiltrative cardiomyopathy, RV systolic function mildly reduced, LVH, indeterminate DD function  5/30 HRCT >>1. There are multiple dense bilateral consolidations, generally with spiculated margins  and internal air bronchograms. The largest and most dense consolidations are in the bilateral superior segments of the lower lobes. These are unchanged compared to recent CT dated 11/15/2020 are slightly increased in size and density in comparison to the prior dated 11/06/2018. There may be some very fine associated nodularity. This unusual constellation of findings, in combination with enlarged mediastinal and hilar lymph nodes, suggests pulmonary sarcoidosis with progressive massive fibrosis. 2. Numerous enlarged mediastinal and bilateral hilar lymph nodes, unchanged, nonspecific, although as above, may reflect nodal sarcoidosis. 3. Cardiomegaly and coronary artery disease.  5/31 R/LHC >>  Prox RCA lesion is 100% stenosed.  Prox LAD lesion is 50% stenosed.   1. Normal filling pressures.  2. Low cardiac output.  Mildly decreased by thermodilution which is likely more accurate as oxygen saturation was up and down during case so unsure of Fick accuracy.  3. Totally occluded RCA is likely chronic.  There are R=>R collaterals, not L=>R collaterals.  50% stenosis proximal LAD.   Suspect mixed ischemic/nonischemic cardiomyopathy.   6/1 cMRI >> 1. Bilateral airspace disease, see dedicated chest CT from earlier this admission. 2. Severely dilated left ventricle with marked septal-lateral dyssynchrony as well as inferior severe hypokinesis and septal severe hypokinesis. Difficult to quantify EF due to dyssynchrony, but calculated at 18%. 3.  Mildly dilated RV with severe systolic dysfunction, EF 43%. 4. Poor delayed enhancement images with significant artifact.  Probably inferior RV insertion site LGE.  6/1 LN biopsy >> FINAL MICROSCOPIC DIAGNOSIS:  A. LYMPH NODE, RLL, BIOPSIES:  - No malignant cells identified  - Granulomatous inflammation  B. LYMPH NODE, 7 NODE, FINE NEEDLE ASPIRATION:  - No malignant cells identified  - Granulomatous inflammation   Resolved Hospital Problem list    NA  Assessment & Plan:   Acute hypoxemic respiratory failure -Continue full ventilator support Target TVol 6-8cc/kgIBW Target Plateau Pressure < 30cm H20 Target PaO2 55-65: titrate PEEP/FiO2 per protocol Ventilator associated pneumonia prevention protocol -Attempt another SUA/SBT today   Bronchospasm -On steroids -Continue bronchodilators  Persistent bilateral infiltrate with a high ACE level, mediastinal adenopathy -Concern for sarcoidosis -Post bronchoscopy, biopsy results returned with granulomatous inflammation -Systolic cardiomyopathy felt may be related to sarcoidosis as well -Follow-up ANA, CCP, GBM, ANCA panel, SCL 70, SSA/SSB  History of obstructive sleep apnea -Noncompliant with CPAP therapy  Hypotension -Still requiring pressors at present -Wean off pressors as tolerated  Acute on chronic systolic heart failure Mixed ischemic/nonischemic cardiomyopathy Coronary artery disease -Concern for sarcoidosis -Results from right heart catheterization noted- -has diuresed significantly since admission -cardiology following  Type 2 diabetes -Continue SSI -Continue Lantus  Morbid obesity  -Counseling when appropriate   Best practice (right click and "Reselect all SmartList Selections" daily)  Diet:  Tube Feed ; Pain/Anxiety/Delirium protocol (if indicated): Yes (RASS goal -1) VAP protocol (if indicated): Yes DVT prophylaxis: SCD GI prophylaxis: PPI Glucose control:  SSI Yes Central venous access:  N/A, PICC in place Arterial line:  Yes, and it is still needed Foley:  N/A; bladder scan q 4 Mobility:  bed rest  PT consulted: N/A Last date of multidisciplinary goals of care discussion [pending] Code Status:  full code  Disposition: tx ICU   Labs   CBC: Recent Labs  Lab 11/18/20 0625 11/19/20 0224 11/20/20 0254 11/20/20 0841 11/20/20 0842 11/21/20 0556 11/21/20 1550 11/22/20 0443 11/22/20 0759  WBC 7.5 6.2 7.3  --   --  6.1  --  12.4*  --    NEUTROABS 4.4 3.6 3.9  --   --  3.7  --  10.5*  --   HGB 17.6* 17.2* 18.1*   < > 19.4* 19.4* 17.7* 18.9* 19.0*  HCT 54.6* 54.2* 55.9*   < > 57.0* 58.5* 52.0 58.0* 56.0*  MCV 86.5 87.0 86.4  --   --  85.5  --  86.2  --   PLT 260 256 281  --   --  231  --  320  --    < > = values in this interval not displayed.    Basic Metabolic Panel: Recent Labs  Lab 11/17/20 0155 11/18/20 0630 11/19/20 0224 11/20/20 0254 11/20/20 0841 11/21/20 0556 11/21/20 1550 11/22/20 0443 11/22/20 0759 11/22/20 1107 11/22/20 1635 11/23/20 0450  NA 138   < > 135 134*   < > 136 141 132* 137  --   --  126*  K 3.8   < > 3.5 4.6   < > 3.9 4.2 4.8 4.5  --   --  4.8  CL 95*   < > 96* 99  --  103  --  101  --   --   --  96*  CO2 34*   < > 32 25  --  23  --  19*  --   --   --  18*  GLUCOSE 156*   < > 191* 200*  --  153*  --  245*  --   --   --  349*  BUN 17   < > 17 19  --  17  --  27*  --   --   --  28*  CREATININE 0.85   < > 0.82 0.82  --  0.75  --  0.97  --   --   --  0.71  CALCIUM 9.1   < > 9.1 9.2  --  9.3  --  8.7*  --   --   --  7.3*  MG 1.7  --   --  2.0  --   --   --   --   --  1.4* 1.4* 2.3  PHOS  --   --   --   --   --   --   --   --   --  2.3* 2.2* 3.0   < > = values in this interval not displayed.   GFR: Estimated Creatinine Clearance: 186.5 mL/min (by C-G formula based on SCr of 0.71 mg/dL). Recent Labs  Lab 11/19/20 0224 11/20/20 0254 11/21/20 0556 11/22/20 0443  WBC 6.2 7.3 6.1 12.4*    Liver Function Tests: Recent Labs  Lab 11/18/20 0625 11/19/20 0224 11/20/20 0254 11/21/20 0556 11/22/20 0443  AST 16 17 32 27 20  ALT 28 27 31  42 43  ALKPHOS 94 99 95 96 91  BILITOT 0.9 0.8 1.0 0.6 0.9  PROT 6.7 6.4* 6.6 6.5 6.7  ALBUMIN 3.3* 3.2* 3.3* 3.3* 3.4*   No results for input(s): LIPASE, AMYLASE in the last 168 hours. No results for input(s): AMMONIA in the last 168 hours.  ABG    Component Value Date/Time   PHART 7.279 (L) 11/22/2020 0759   PCO2ART  40.8 11/22/2020 0759    PO2ART 68 (L) 11/22/2020 0759   HCO3 19.2 (L) 11/22/2020 0759   TCO2 20 (L) 11/22/2020 0759   ACIDBASEDEF 7.0 (H) 11/22/2020 0759   O2SAT 89.2 11/23/2020 0520     Coagulation Profile: No results for input(s): INR, PROTIME in the last 168 hours.  Cardiac Enzymes: No results for input(s): CKTOTAL, CKMB, CKMBINDEX, TROPONINI in the last 168 hours.  HbA1C: Hgb A1c MFr Bld  Date/Time Value Ref Range Status  11/16/2020 04:21 AM 11.5 (H) 4.8 - 5.6 % Final    Comment:    (NOTE)         Prediabetes: 5.7 - 6.4         Diabetes: >6.4         Glycemic control for adults with diabetes: <7.0   11/16/2020 12:09 AM 11.5 (H) 4.8 - 5.6 % Final    Comment:    (NOTE)         Prediabetes: 5.7 - 6.4         Diabetes: >6.4         Glycemic control for adults with diabetes: <7.0     CBG: Recent Labs  Lab 11/22/20 1153 11/22/20 1554 11/22/20 2005 11/23/20 0023 11/23/20 0402  GLUCAP 280* [place], MS4

## 2020-11-24 LAB — CBC WITH DIFFERENTIAL/PLATELET
Abs Immature Granulocytes: 0.04 10*3/uL (ref 0.00–0.07)
Basophils Absolute: 0 10*3/uL (ref 0.0–0.1)
Basophils Relative: 0 %
Eosinophils Absolute: 0 10*3/uL (ref 0.0–0.5)
Eosinophils Relative: 0 %
HCT: 53.6 % — ABNORMAL HIGH (ref 39.0–52.0)
Hemoglobin: 17.6 g/dL — ABNORMAL HIGH (ref 13.0–17.0)
Immature Granulocytes: 0 %
Lymphocytes Relative: 14 %
Lymphs Abs: 1.9 10*3/uL (ref 0.7–4.0)
MCH: 28.3 pg (ref 26.0–34.0)
MCHC: 32.8 g/dL (ref 30.0–36.0)
MCV: 86.3 fL (ref 80.0–100.0)
Monocytes Absolute: 1.1 10*3/uL — ABNORMAL HIGH (ref 0.1–1.0)
Monocytes Relative: 8 %
Neutro Abs: 10.6 10*3/uL — ABNORMAL HIGH (ref 1.7–7.7)
Neutrophils Relative %: 78 %
Platelets: 198 10*3/uL (ref 150–400)
RBC: 6.21 MIL/uL — ABNORMAL HIGH (ref 4.22–5.81)
RDW: 14.2 % (ref 11.5–15.5)
WBC: 13.7 10*3/uL — ABNORMAL HIGH (ref 4.0–10.5)
nRBC: 0 % (ref 0.0–0.2)

## 2020-11-24 LAB — COMPREHENSIVE METABOLIC PANEL
ALT: 26 U/L (ref 0–44)
AST: 18 U/L (ref 15–41)
Albumin: 3.3 g/dL — ABNORMAL LOW (ref 3.5–5.0)
Alkaline Phosphatase: 85 U/L (ref 38–126)
Anion gap: 8 (ref 5–15)
BUN: 28 mg/dL — ABNORMAL HIGH (ref 6–20)
CO2: 28 mmol/L (ref 22–32)
Calcium: 8.8 mg/dL — ABNORMAL LOW (ref 8.9–10.3)
Chloride: 99 mmol/L (ref 98–111)
Creatinine, Ser: 0.64 mg/dL (ref 0.61–1.24)
GFR, Estimated: >60 mL/min (ref >60)
Glucose, Bld: 162 mg/dL — ABNORMAL HIGH (ref 70–99)
Potassium: 4 mmol/L (ref 3.5–5.1)
Sodium: 135 mmol/L (ref 135–145)
Total Bilirubin: 0.8 mg/dL (ref 0.3–1.2)
Total Protein: 6.3 g/dL — ABNORMAL LOW (ref 6.5–8.1)

## 2020-11-24 LAB — TROPONIN I (HIGH SENSITIVITY): Troponin I (High Sensitivity): 32 ng/L — ABNORMAL HIGH (ref <18)

## 2020-11-24 LAB — COOXEMETRY PANEL
Carboxyhemoglobin: 1 % (ref 0.5–1.5)
Methemoglobin: 0.8 % (ref 0.0–1.5)
O2 Saturation: 66.3 %
Total hemoglobin: 17.5 g/dL — ABNORMAL HIGH (ref 12.0–16.0)

## 2020-11-24 LAB — GLUCOSE, CAPILLARY
Glucose-Capillary: 143 mg/dL — ABNORMAL HIGH (ref 70–99)
Glucose-Capillary: 96 mg/dL (ref 70–99)
Glucose-Capillary: 254 mg/dL — ABNORMAL HIGH (ref 70–99)
Glucose-Capillary: 398 mg/dL — ABNORMAL HIGH (ref 70–99)
Glucose-Capillary: 358 mg/dL — ABNORMAL HIGH (ref 70–99)

## 2020-11-24 LAB — MAGNESIUM: Magnesium: 1.9 mg/dL (ref 1.7–2.4)

## 2020-11-24 LAB — DIGOXIN LEVEL: Digoxin Level: <0.2 ng/mL — ABNORMAL LOW (ref 0.8–2.0)

## 2020-11-24 MED ORDER — AMIODARONE HCL IN DEXTROSE 360-4.14 MG/200ML-% IV SOLN: 60.0000 mg/h | INTRAVENOUS | Status: DC

## 2020-11-24 MED ORDER — INSULIN GLARGINE 100 UNIT/ML ~~LOC~~ SOLN
45.0000 [IU] | Freq: Two times a day (BID) | SUBCUTANEOUS | Status: DC
  Administered 2020-11-24 – 2020-11-27 (×6): 45 [IU] via SUBCUTANEOUS
  Filled 2020-11-24 (×8): qty 0.45

## 2020-11-24 MED ORDER — AMIODARONE LOAD VIA INFUSION: 150.0000 mg | Freq: Once | INTRAVENOUS | Status: DC | PRN

## 2020-11-24 MED ORDER — FLUTICASONE PROPIONATE 50 MCG/ACT NA SUSP
2.0000 | Freq: Every day | NASAL | Status: DC
  Administered 2020-11-24 – 2020-11-28 (×5): 2 via NASAL
  Filled 2020-11-24: qty 16

## 2020-11-24 MED ORDER — INSULIN ASPART 100 UNIT/ML IJ SOLN
0.0000 [IU] | Freq: Three times a day (TID) | INTRAMUSCULAR | Status: DC
  Administered 2020-11-24: 11 [IU] via SUBCUTANEOUS
  Administered 2020-11-24: 20 [IU] via SUBCUTANEOUS
  Administered 2020-11-25: 3 [IU] via SUBCUTANEOUS
  Administered 2020-11-25: 15 [IU] via SUBCUTANEOUS
  Administered 2020-11-25: 11 [IU] via SUBCUTANEOUS
  Administered 2020-11-26: 7 [IU] via SUBCUTANEOUS
  Administered 2020-11-26: 4 [IU] via SUBCUTANEOUS
  Administered 2020-11-26: 3 [IU] via SUBCUTANEOUS
  Administered 2020-11-27: 4 [IU] via SUBCUTANEOUS
  Administered 2020-11-27: 20 [IU] via SUBCUTANEOUS
  Administered 2020-11-27: 7 [IU] via SUBCUTANEOUS
  Administered 2020-11-28: 20 [IU] via SUBCUTANEOUS
  Administered 2020-11-28: 3 [IU] via SUBCUTANEOUS

## 2020-11-24 MED ORDER — INSULIN ASPART 100 UNIT/ML IJ SOLN
5.0000 [IU] | INTRAMUSCULAR | Status: DC
  Administered 2020-11-24 – 2020-11-25 (×5): 5 [IU] via SUBCUTANEOUS

## 2020-11-24 MED ORDER — AMIODARONE HCL IN DEXTROSE 360-4.14 MG/200ML-% IV SOLN: 30.0000 mg/h | INTRAVENOUS | Status: DC

## 2020-11-24 MED ORDER — NITROGLYCERIN 0.4 MG SL SUBL
0.4000 mg | SUBLINGUAL_TABLET | SUBLINGUAL | Status: DC | PRN
  Filled 2020-11-24: qty 1

## 2020-11-24 MED ORDER — SACUBITRIL-VALSARTAN 24-26 MG PO TABS
1.0000 | ORAL_TABLET | Freq: Two times a day (BID) | ORAL | Status: DC
  Administered 2020-11-24 – 2020-11-28 (×9): 1 via ORAL
  Filled 2020-11-24 (×10): qty 1

## 2020-11-24 MED ORDER — INSULIN ASPART 100 UNIT/ML IJ SOLN
0.0000 [IU] | Freq: Every day | INTRAMUSCULAR | Status: DC
  Administered 2020-11-24: 5 [IU] via SUBCUTANEOUS
  Administered 2020-11-25 – 2020-11-26 (×2): 4 [IU] via SUBCUTANEOUS
  Administered 2020-11-27: 3 [IU] via SUBCUTANEOUS

## 2020-11-24 MED ORDER — SALINE SPRAY 0.65 % NA SOLN
1.0000 | NASAL | Status: DC | PRN
  Filled 2020-11-24: qty 44

## 2020-11-24 MED ORDER — AMIODARONE LOAD VIA INFUSION: 150.0000 mg | Freq: Once | INTRAVENOUS | Status: DC

## 2020-11-24 MED ORDER — CARVEDILOL 3.125 MG PO TABS
3.1250 mg | ORAL_TABLET | Freq: Two times a day (BID) | ORAL | Status: DC
  Administered 2020-11-24 – 2020-11-28 (×10): 3.125 mg via ORAL
  Filled 2020-11-24 (×11): qty 1

## 2020-11-24 NOTE — Progress Notes (Signed)
Patient ID: Marc Wright, male   DOB: 03-12-1976, 45 y.o.   MRN: [id number]     Advanced Heart Failure Rounding Note  PCP-Cardiologist: None   Subjective:    Developed bronchospasm/hypotension with bronchoscopy/biopsy on 6/1, intubated. Started on Solumedrol.  Extubated 6/3, off pressors.   CVP 10-11 today, co-ox 66%. Denies dyspnea. Good diuresis yesterday.   There was concern for VT this morning, but it actually looks like sinus tachy (has long 1st degree AVB and IVCD).   Cardiac MRI: 1. Bilateral airspace disease, see dedicated chest CT from earlier this admission. 2. Severely dilated left ventricle with marked septal-lateral dyssynchrony as well as inferior severe hypokinesis and septal severe hypokinesis. Difficult to quantify EF due to dyssynchrony, but calculated at 18%. 3. Mildly dilated RV with severe systolic dysfunction, EF 17%. 4. Poor delayed enhancement images with significant artifact. Probably inferior RV insertion site LGE (nonspecific).  RHC/LHC: Coronary Findings   Diagnostic Dominance: Right  Left Anterior Descending  Prox LAD lesion is 50% stenosed.  Right Coronary Artery  Prox RCA lesion is 100% stenosed. There are weak right-right collaterals, no left-right collaterals.   Intervention   No interventions have been documented.  Right Heart  Right Heart Pressures RHC Procedural Findings: Hemodynamics (mmHg) RA mean 2 RV 31/3 PA 30/15, mean 21 PCWP mean 8 LV 101/15 AO 100/71  Oxygen saturations: PA 60% AO 93%  Cardiac Output (Fick) 4.6  Cardiac Index (Fick) 1.64   Cardiac Output (Thermo) 5.98  Cardiac Index (Thermo) 2.13      Objective:   Weight Range: (!) 161.5 kg Body mass index is 44.5 kg/m.   Vital Signs:   Temp:  [97.5 F (36.4 C)-98.5 F (36.9 C)] 98.1 F (36.7 C) (06/04 0700) Pulse Rate:  [71-125] 99 (06/04 0600) Resp:  [0-25] 19 (06/04 0600) BP: (101-169)/(66-145) 130/91 (06/04 0600) SpO2:  [89 %-100 %] 97 % (06/04  0600) Arterial Line BP: (88-155)/(61-94) 100/82 (06/04 0600) Weight:  [161.5 kg] 161.5 kg (06/04 0400) Last BM Date: 11/22/20  Weight change: Filed Weights   11/22/20 0630 11/23/20 0448 11/24/20 0400  Weight: (!) 162.4 kg (!) 162 kg (!) 161.5 kg    Intake/Output:   Intake/Output Summary (Last 24 hours) at 11/24/2020 0949 Last data filed at 11/24/2020 0800 Gross per 24 hour  Intake 688.46 ml  Output 3000 ml  Net -2311.54 ml      Physical Exam  CVP 11-12  General: NAD Neck: JVP 10 cm, no thyromegaly or thyroid nodule.  Lungs: Crackles at bases.  CV: Nondisplaced PMI.  Heart regular S1/S2, no S3/S4, no murmur.  No peripheral edema.   Abdomen: Soft, nontender, no hepatosplenomegaly, no distention.  Skin: Intact without lesions or rashes.  Neurologic: Alert and oriented x 3.  Psych: Normal affect. Extremities: No clubbing or cyanosis.  HEENT: Normal.    Telemetry   SR with 1st degree block (personally reviewed).    Labs    CBC Recent Labs    11/22/20 0443 11/22/20 0759 11/24/20 0230  WBC 12.4*  --  13.7*  NEUTROABS 10.5*  --  10.6*  HGB 18.9* 19.0* 17.6*  HCT 58.0* 56.0* 53.6*  MCV 86.2  --  86.3  PLT 320  --  198   Basic Metabolic Panel Recent Labs    24/40/10 1635 11/23/20 0450 11/24/20 0230  NA  --  126* 135  K  --  4.8 4.0  CL  --  96* 99  CO2  --  18* 28  GLUCOSE  --  349* 162*  BUN  --  28* 28*  CREATININE  --  0.71 0.64  CALCIUM  --  7.3* 8.8*  MG 1.4* 2.3  --   PHOS 2.2* 3.0  --    Liver Function Tests Recent Labs    11/22/20 0443 11/24/20 0230  AST 20 18  ALT 43 26  ALKPHOS 91 85  BILITOT 0.9 0.8  PROT 6.7 6.3*  ALBUMIN 3.4* 3.3*   No results for input(s): LIPASE, AMYLASE in the last 72 hours. Cardiac Enzymes No results for input(s): CKTOTAL, CKMB, CKMBINDEX, TROPONINI in the last 72 hours.  BNP: BNP (last 3 results) Recent Labs    11/15/20 1556  BNP 598.1*    ProBNP (last 3 results) No results for input(s): PROBNP in  the last 8760 hours.   D-Dimer No results for input(s): DDIMER in the last 72 hours. Hemoglobin A1C No results for input(s): HGBA1C in the last 72 hours. Fasting Lipid Panel Recent Labs    11/22/20 0444  TRIG 209*   Thyroid Function Tests No results for input(s): TSH, T4TOTAL, T3FREE, THYROIDAB in the last 72 hours.  Invalid input(s): FREET3  Other results:   Imaging    No results found.   Medications:     Scheduled Medications: . aspirin EC  81 mg Oral Daily  . carvedilol  3.125 mg Oral BID WC  . Chlorhexidine Gluconate Cloth  6 each Topical Daily  . digoxin  0.125 mg Oral Daily  . enoxaparin (LOVENOX) injection  80 mg Subcutaneous Q24H  . fluticasone  2 spray Each Nare Daily  . folic acid  1 mg Oral Daily  . furosemide  60 mg Intravenous BID  . insulin aspart  0-20 Units Subcutaneous Q4H  . insulin aspart  10 Units Subcutaneous Q4H  . insulin glargine  30 Units Subcutaneous BID  . mouth rinse  15 mL Mouth Rinse BID  . methotrexate  15 mg Oral Weekly  . methylPREDNISolone (SOLU-MEDROL) injection  40 mg Intravenous Q12H  . rosuvastatin  20 mg Oral Daily  . sacubitril-valsartan  1 tablet Oral BID  . sodium chloride flush  10-40 mL Intracatheter Q12H  . sulfamethoxazole-trimethoprim  1 tablet Oral Once per day on Mon Wed Fri    Infusions: . sodium chloride 10 mL/hr at 11/24/20 0600  . feeding supplement (VITAL AF 1.2 CAL) 1,000 mL (11/22/20 1241)  . norepinephrine (LEVOPHED) Adult infusion Stopped (11/23/20 1020)    PRN Medications: HYDROcodone-acetaminophen, ipratropium-albuterol, nitroGLYCERIN, sodium chloride, sodium chloride flush   Assessment/Plan   1. 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 He has a wide LBBB. Echo this admission with basal-mid inferior akinesis, dyssynchrony from LBBB, EF 20-25%, moderate LVH, mildly decreased RV function. RHC/LHC with normal filling pressures, marginal CI (2.1 by thermodilution is  likely more accurate than Fick); cath showed 50% proximal LAD and occluded RCA (chronic).  Extent of cardiomyopathy is not explained by coronary disease, suspect there is a nonischemic component likely from cardiac sarcoidosis. Cardiac MRI showed only nonspecific RV insertion site LGE (findings were not highly suggestive of cardiac sarcoidosis), but study was technically difficult.  Today, CVP 11-12 with co-ox 66%.  - Restart low dose Coreg 3.125 mg bid (watch with 1st degree AVB).   - Continue Lasix 60 mg IV bid today, probably to po tomorrow.  - Continue digoxin 0.125, follow closely with 1st degree AVB.  - Restart Entresto 24/26 bid.   - Will not add SGLT2  inhibitor yet with uncontrolled diabetes.  - Needs EP evaluation eventually for CRT-D placement (has had low EF since 2018, has wide LBBB, high risk for arrhythmias with suspected cardiac sarcoidosis).  Would like to have this done prior to discharge.  - Will eventually need cardiac PET for assessment of cardiac involvement by sarcoidosis (will need to be done at [facility] as outpatient).  2. CAD: Presentation with CHF, not ACS.  LHC with 50% pLAD, totally occluded proximal RCA with R=>R collaterals (old occlusion).  Reviewed with interventional cardiology, would hold off on PCI attempt at this time as CTO.  - ASA 81 daily - Crestor 20 mg daily.  3. OHS/OSA: Needs CPAP, on oxygen during the day. 4. Sarcoidosis: High resolution CT chest showed dense bilateral consolidations and enlarged hilar/mediastinal nodes.  Does not appear infectious.  Sarcoid is likely unifying diagnosis for heart and lungs. ACE level elevated.  He has had bronch/biopsy, path suggestive of sarcoidosis.  Intubated post-bronch with bronchospasm/hypotension and now extubated.  - Steroids per CCM.  - Bactrim for PJP prophylaxis. - Starting weekly MTX for steroid sparing.  5. Smoking: Needs to quit.   6. Type 2 diabetes: Per primary service.  7. NSVT/PVCs: Concern for NSVT this  morning but think sinus tachy.  - Hold off on amiodarone.  - Restart Coreg. - Will eventually need ICD as above.   Length of Stay: 9  Marca Ancona, MD  11/24/2020, 9:49 AM  Advanced Heart Failure Team Pager [phone] (M-F; 7a - 5p)  Please contact [facility] for night-coverage after hours (5p -7a ) and weekends on [email or web address]

## 2020-11-24 NOTE — Progress Notes (Signed)
eLink Physician-Brief Progress Note Patient Name: Marc Wright DOB: 20-Jul-1975 MRN: [id number]   Date of Service  11/24/2020  HPI/Events of Note  Troponin = 32.  eICU Interventions  Continue present management.      Intervention Category Major Interventions: Other:  Lenell Antu 11/24/2020, 5:24 AM

## 2020-11-24 NOTE — Progress Notes (Signed)
After bath, pt started to cough and his HR went up in the low 200s, O2 >955, art line BP = 124/74  Pt denies CP, SOB, dyspnea but did report some facial pressure/sinus  He is A&O x4... NAD... He is watching shows on his computer and laughing.   EKG obtained... eLink was notified and message left w/Felicia RN... will await further instructions.

## 2020-11-24 NOTE — Evaluation (Signed)
Physical Therapy Evaluation Patient Details Name: Marc Wright MRN: [id number] DOB: Nov 19, 1975 Today's Date: 11/24/2020   History of Present Illness  Patient is a 45 y/o male who presents on 11/15/20 with SOB, cough, fever and, body aches. Chest CT- multifocal consolidation with mediastinal lymphadenopathy and some infiltrates concerning for sarcoid. Admitted with acute on chronic CHF, respiratory failure, HTN, sarcoidosis with cardiac component.  Pt s/p L heart cath on 11/20/20 and bronchoscopy 11/21/20 and remained intubated post procedure.  Extubated 11/23/20. PMH includes HFrEF 25-30%, HTN, morbid obesity, OSA on CPAP, DM2.  Clinical Impression  Pt weak and lightheaded after being in the bed a few days. He was able to transfer OOB to chair with the support of therapist and RW.  He would benefit from acute PT.  Of note, he reports his left foot tingles and he has difficulty Plantar-flexing his ankle and toes.  Otherwise strength is generally weak bil legs.  He is unsteady on his feet.  He would benefit from acute skilled PT.   PT to follow acutely for deficits listed below.      Follow Up Recommendations Home health PT;Supervision for mobility/OOB    Equipment Recommendations  Rolling walker with 5" wheels;3in1 (PT)    Recommendations for Other Services OT consult     Precautions / Restrictions Precautions Precautions: Fall Precaution Comments: watch 02 and HR      Mobility  Bed Mobility Overal bed mobility: Modified Independent                  Transfers Overall transfer level: Needs assistance Equipment used: Rolling walker (2 wheeled);1 person hand held assist Transfers: Sit to/from [employer] Sit to Stand: Min assist Stand pivot transfers: Min assist       General transfer comment: Stood several times EOB, however, pt reporting lightheadedness with soft BPs.  Decided due to lightheadedness and generalized weakness to use RW and transfer OOB to chair  only.  HR in the 100s-110s, BPs 110s/70s, O2 97% on RA without DOE.  Ambulation/Gait                Stairs            Wheelchair Mobility    Modified Rankin (Stroke Patients Only)       Balance Overall balance assessment: Needs assistance Sitting-balance support: Feet supported;No upper extremity supported Sitting balance-Leahy Scale: Fair     Standing balance support: Bilateral upper extremity supported Standing balance-Leahy Scale: Poor Standing balance comment: reliant on bil UE support in standing as well as support from PT                             Pertinent Vitals/Pain Pain Assessment: Faces Faces Pain Scale: Hurts little more Pain Location: bottom from laying in bed Pain Descriptors / Indicators: Sore;Aching Pain Intervention(s): Monitored during session;Limited activity within patient's tolerance;Repositioned    Home Living Family/patient expects to be discharged to:: Private residence Living Arrangements: Alone   Type of Home: House Home Access: Stairs to enter Entrance Stairs-Rails: Right Entrance Stairs-Number of Steps: 6 Home Layout: One level Home Equipment: None      Prior Function Level of Independence: Independent         Comments: Drives, works as a Investment banker, 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 Does not wear 02. Does own ADLs/IADLs, no AD for mobility.     Hand Dominance   Dominant Hand: Right    Extremity/Trunk Assessment  Upper Extremity Assessment Upper Extremity Assessment: Overall WFL for tasks assessed    Lower Extremity Assessment Lower Extremity Assessment: Generalized weakness    Cervical / Trunk Assessment Cervical / Trunk Assessment: Normal  Communication   Communication: No difficulties  Cognition Arousal/Alertness: Awake/alert Behavior During Therapy: WFL for tasks assessed/performed Overall Cognitive Status: Within Functional Limits for tasks assessed                                         General Comments      Exercises     Assessment/Plan    PT Assessment Patient needs continued PT services  PT Problem List Decreased strength;Decreased activity tolerance;Decreased mobility;Decreased balance;Decreased knowledge of use of DME;Cardiopulmonary status limiting activity;Obesity       PT Treatment Interventions DME instruction;Gait training;Stair training;Functional mobility training;Therapeutic activities;Therapeutic exercise;Balance training;Patient/family education    PT Goals (Current goals can be found in the Care Plan section)  Acute Rehab PT Goals Patient Stated Goal: to walk without the walker, regain his strength PT Goal Formulation: With patient Time For Goal Achievement: 12/08/20 Potential to Achieve Goals: Good    Frequency Min 3X/week   Barriers to discharge        Co-evaluation               AM-PAC PT "6 Clicks" Mobility  Outcome Measure Help needed turning from your back to your side while in a flat bed without using bedrails?: A Little Help needed moving from lying on your back to sitting on the side of a flat bed without using bedrails?: A Little Help needed moving to and from a bed to a chair (including a wheelchair)?: A Little Help needed standing up from a chair using your arms (e.g., wheelchair or bedside chair)?: A Little Help needed to walk in hospital room?: Total Help needed climbing 3-5 steps with a railing? : Total 6 Click Score: 14    End of Session   Activity Tolerance: Patient limited by fatigue;Other (comment) (limited by lightheadedness) Patient left: in chair;with call bell/phone within reach;with nursing/sitter in room Nurse Communication: Mobility status PT Visit Diagnosis: Muscle weakness (generalized) (M62.81);Difficulty in walking, not elsewhere classified (R26.2)    Time: 9937-1696 PT Time Calculation (min) (ACUTE ONLY): 25 min   Charges:   PT Evaluation $PT Eval Moderate Complexity: 1 Mod PT  Treatments $Therapeutic Activity: 8-22 mins        Corinna Capra, PT, DPT  Acute Rehabilitation Ortho Tech Supervisor [phone] pager [phone]) [phone] office

## 2020-11-24 NOTE — Care Management (Signed)
Follow up scheduled at [facility] on 7/7, added to AVS. TOC team will follow for medication needs.  Please consider utilizing [facility] pharmacy for rxs at [place], they are open M-F.

## 2020-11-24 NOTE — Progress Notes (Signed)
[facility] PCCM  Worsening hyperglycemia Add back scheduled regular insulin now until tomorrow morning Increase glargine dose  Heber [place], MD [place] P[facility]M Pager: [phone] Cell: [phone] If no response, please call [phone] until 7pm After 7:00 pm call Elink  [phone]

## 2020-11-24 NOTE — Progress Notes (Signed)
NAME:  Marc Wright, MRN:  [id number], DOB:  Oct 06, 1975, LOS: 9 ADMISSION DATE:  11/15/2020, CONSULTATION DATE:  6/3 REFERRING MD: Mahala Menghini, CHIEF COMPLAINT:  Dyspnea   History of Present Illness:  45 y/o male admitted in setting of dyspnea, noted to have systolic heart failure and mediastinal adenopathy with findings worrisome for sarcoidosis.  Underwent bronchoscopy/EBUS on 6/1, required mechanical ventilation after. Liberated from mechanical ventilation on 6/3. Pathology consistent with granulomatous inflammation.  Pertinent  Medical History  Asthma OSA on CPAP CHF DM2  Significant Hospital Events: Including procedures, antibiotic start and stop dates in addition to other pertinent events    5/25-10/2024 admitted to hospital, began diuresed, got dose of Solu-Medrol 60 mg IV x1  5/27 pulmonary consulted  5/30 Most respiratory symptoms resolved or markedly improved  5/31  -s/p [facility] w/ Dr. Shirlee Latch. ok for patient to undergo bronch/EBUS under [facility] while as inpatient based on cath results.  6/1 Underwent bronchoscopy with EBUS, on vent . 6/3 extubated  Interim History / Subjective:  Extubated yesterday Complained of chest pain Sinus tach this morning   Objective   Blood pressure (!) 130/91, pulse 99, temperature 98.1 F (36.7 C), temperature source Oral, resp. rate 19, height 6\' 3"  (1.905 m), weight (!) 161.5 kg, SpO2 97 %. CVP:  [12 mmHg-17 mmHg] 12 mmHg  Vent Mode: PRVC FiO2 (%):  [30 %] 30 % Set Rate:  [18 bmp] 18 bmp Vt Set:  [670 mL] 670 mL PEEP:  [5 cmH20] 5 cmH20 Plateau Pressure:  [19 cmH20] 19 cmH20   Intake/Output Summary (Last 24 hours) at 11/24/2020 0720 Last data filed at 11/24/2020 0600 Gross per 24 hour  Intake 618.46 ml  Output 3000 ml  Net -2381.54 ml   Filed Weights   11/22/20 0630 11/23/20 0448 11/24/20 0400  Weight: (!) 162.4 kg (!) 162 kg (!) 161.5 kg    Examination:  General:  Resting comfortably in bed HENT: NCAT OP clear PULM: CTA B, normal  effort CV: RRR, no mgr GI: BS+, soft, nontender MSK: normal bulk and tone Neuro: awake, alert, no distress, MAEW   Labs/imaging that I havepersonally reviewed  (right click and "Reselect all SmartList Selections" daily)  5/27 TTE LVEF 20-25%, akinesis of basal to mid spetum and basal to mid inferior wall-> consider infiltrative cardiomyopathy, RV systolic function mildly reduced, LVH, indeterminate DD function  5/30 HRCT >>1. There are multiple dense bilateral consolidations, generally with spiculated margins and internal air bronchograms. The largest and most dense consolidations are in the bilateral superior segments of the lower lobes. These are unchanged compared to recent CT dated 11/15/2020 are slightly increased in size and density in comparison to the prior dated 11/06/2018. There may be some very fine associated nodularity. This unusual constellation of findings, in combination with enlarged mediastinal and hilar lymph nodes, suggests pulmonary sarcoidosis with progressive massive fibrosis. 2. Numerous enlarged mediastinal and bilateral hilar lymph nodes, unchanged, nonspecific, although as above, may reflect nodal sarcoidosis. 3. Cardiomegaly and coronary artery disease.  5/31 R/LHC >>  Prox RCA lesion is 100% stenosed.  Prox LAD lesion is 50% stenosed.  1. Normal filling pressures.  2. Low cardiac output. Mildly decreased by thermodilution which is likely more accurate as oxygen saturation was up and down during case so unsure of Fick accuracy.  3. Totally occluded RCA is likely chronic. There are R=>R collaterals, not L=>R collaterals. 50% stenosis proximal LAD.   Suspect mixed ischemic/nonischemic cardiomyopathy.   6/1 cMRI >> 1. Bilateral airspace  disease, see dedicated chest CT from earlier this admission. 2. Severely dilated left ventricle with marked septal-lateral dyssynchrony as well as inferior severe hypokinesis and septal severe hypokinesis. Difficult to  quantify EF due to dyssynchrony, but calculated at 18%. 3. Mildly dilated RV with severe systolic dysfunction, EF 17%. 4. Poor delayed enhancement images with significant artifact.  Probably inferior RV insertion site LGE.  6/1 LN biopsy >> FINAL MICROSCOPIC DIAGNOSIS:  A. LYMPH NODE, RLL, BIOPSIES:  - No malignant cells identified  - Granulomatous inflammation  B. LYMPH NODE, 7 NODE, FINE NEEDLE ASPIRATION:  - No malignant cells identified  - Granulomatous inflammation   Resolved Hospital Problem list   Acute hypoxemic respiratory failure Hypotension  Assessment & Plan:    Presumed pulmonary sarcoidosis with cardiac involvement Continue solumedrol 0.5mg /kg IV today, will transition to prednisone on 6/5 Continue methotrexate Continue bactrim Would plan 12 months of methotrexate minimum, try to get off steroids by 3-6 months if EF improved Monitor LFT/CBC for toxicity while on therapy  OSA CPAP qHS  Acute on chronic systolic heart failure Mixed ischemic/non-ischemic cardiomyopathy CAD Sinus tachycardia Tele Add coreg today Continue diuresis Digoxin   DM2> blood sugar control improved SSI Glargine to continue  Stop tube feeding coverage  Morbid obesity   Best practice (right click and "Reselect all SmartList Selections" daily)  Diet:  Oral Pain/Anxiety/Delirium protocol (if indicated): No VAP protocol (if indicated): Not indicated DVT prophylaxis: LMWH GI prophylaxis: N/A Glucose control:  SSI Yes Central venous access:  Yes, and it is still needed Arterial line:  N/A Foley:  Yes, and it is no longer needed Mobility:  OOB  PT consulted: Yes Last date of multidisciplinary goals of care discussion [6/1] Code Status:  full code Disposition: remain in ICU  Labs   CBC: Recent Labs  Lab 11/19/20 0224 11/20/20 0254 11/20/20 0841 11/21/20 0556 11/21/20 1550 11/22/20 0443 11/22/20 0759 11/24/20 0230  WBC 6.2 7.3  --  6.1  --  12.4*  --  13.7*   NEUTROABS 3.6 3.9  --  3.7  --  10.5*  --  10.6*  HGB 17.2* 18.1*   < > 19.4* 17.7* 18.9* 19.0* 17.6*  HCT 54.2* 55.9*   < > 58.5* 52.0 58.0* 56.0* 53.6*  MCV 87.0 86.4  --  85.5  --  86.2  --  86.3  PLT 256 281  --  231  --  320  --  198   < > = values in this interval not displayed.    Basic Metabolic Panel: Recent Labs  Lab 11/20/20 0254 11/20/20 0841 11/21/20 0556 11/21/20 1550 11/22/20 0443 11/22/20 0759 11/22/20 1107 11/22/20 1635 11/23/20 0450 11/24/20 0230  NA 134*   < > 136 141 132* 137  --   --  126* 135  K 4.6   < > 3.9 4.2 4.8 4.5  --   --  4.8 4.0  CL 99  --  103  --  101  --   --   --  96* 99  CO2 25  --  23  --  19*  --   --   --  18* 28  GLUCOSE 200*  --  153*  --  245*  --   --   --  349* 162*  BUN 19  --  17  --  27*  --   --   --  28* 28*  CREATININE 0.82  --  0.75  --  0.97  --   --   --  0.71 0.64  CALCIUM 9.2  --  9.3  --  8.7*  --   --   --  7.3* 8.8*  MG 2.0  --   --   --   --   --  1.4* 1.4* 2.3  --   PHOS  --   --   --   --   --   --  2.3* 2.2* 3.0  --    < > = values in this interval not displayed.   GFR: Estimated Creatinine Clearance: 186.2 mL/min (by C-G formula based on SCr of 0.64 mg/dL). Recent Labs  Lab 11/20/20 0254 11/21/20 0556 11/22/20 0443 11/24/20 0230  WBC 7.3 6.1 12.4* 13.7*    Liver Function Tests: Recent Labs  Lab 11/19/20 0224 11/20/20 0254 11/21/20 0556 11/22/20 0443 11/24/20 0230  AST 17 32 27 20 18   ALT 27 31 42 43 26  ALKPHOS 99 95 96 91 85  BILITOT 0.8 1.0 0.6 0.9 0.8  PROT 6.4* 6.6 6.5 6.7 6.3*  ALBUMIN 3.2* 3.3* 3.3* 3.4* 3.3*   No results for input(s): LIPASE, AMYLASE in the last 168 hours. No results for input(s): AMMONIA in the last 168 hours.  ABG    Component Value Date/Time   PHART 7.279 (L) 11/22/2020 0759   PCO2ART 40.8 11/22/2020 0759   PO2ART 68 (L) 11/22/2020 0759   HCO3 19.2 (L) 11/22/2020 0759   TCO2 20 (L) 11/22/2020 0759   ACIDBASEDEF 7.0 (H) 11/22/2020 0759   O2SAT 66.3  11/24/2020 0344     Coagulation Profile: No results for input(s): INR, PROTIME in the last 168 hours.  Cardiac Enzymes: No results for input(s): CKTOTAL, CKMB, CKMBINDEX, TROPONINI in the last 168 hours.  HbA1C: Hgb A1c MFr Bld  Date/Time Value Ref Range Status  11/16/2020 04:21 AM 11.5 (H) 4.8 - 5.6 % Final    Comment:    (NOTE)         Prediabetes: 5.7 - 6.4         Diabetes: >6.4         Glycemic control for adults with diabetes: <7.0   11/16/2020 12:09 AM 11.5 (H) 4.8 - 5.6 % Final    Comment:    (NOTE)         Prediabetes: 5.7 - 6.4         Diabetes: >6.4         Glycemic control for adults with diabetes: <7.0     CBG: Recent Labs  Lab 11/23/20 1556 11/23/20 1918 11/23/20 2320 11/24/20 0316 11/24/20 0707  GLUCAP 175* 279* 251* 143* 96     Critical care time: n/a      01/24/21, MD [place] P[facility]M Pager: [phone] Cell: [phone] If no response, please call [phone] until 7pm After 7:00 pm call Elink  [phone]

## 2020-11-24 NOTE — Progress Notes (Signed)
eLink Physician-Brief Progress Note Patient Name: Marc Wright DOB: 04-Apr-1976 MRN: [id number]   Date of Service  11/24/2020  HPI/Events of Note  Patient c/o chest pain and pain behind his eyes. EKG reveals wide QRS rhythm.  Right superior axis deviation. Non-specific intra-ventricular conduction block. HR = 107.  Already on ASA. HR now = 102. BP = 88/74 with MAP = 79.  eICU Interventions  Plan: 1. Nitroglycerin 0.4 mg SL Q 5 minutes X 3. 2. Cycle Troponin. 3. Will ask ground team to evaluate the patient at bedside for eye pain.      Intervention Category Major Interventions: Other:  Atianna Haidar Dennard Nip 11/24/2020, 1:56 AM

## 2020-11-24 NOTE — Progress Notes (Signed)
Called and spoke w/Felicia RN at [employer]... notified of elevated Trop = 32... 2 days ago Trop were 24  Will wait for further instructions.

## 2020-11-25 LAB — CBC WITH DIFFERENTIAL/PLATELET
Abs Immature Granulocytes: 0.05 10*3/uL (ref 0.00–0.07)
Basophils Absolute: 0 10*3/uL (ref 0.0–0.1)
Basophils Relative: 0 %
Eosinophils Absolute: 0.1 10*3/uL (ref 0.0–0.5)
Eosinophils Relative: 1 %
HCT: 55 % — ABNORMAL HIGH (ref 39.0–52.0)
Hemoglobin: 17.8 g/dL — ABNORMAL HIGH (ref 13.0–17.0)
Immature Granulocytes: 0 %
Lymphocytes Relative: 16 %
Lymphs Abs: 1.9 10*3/uL (ref 0.7–4.0)
MCH: 27.7 pg (ref 26.0–34.0)
MCHC: 32.4 g/dL (ref 30.0–36.0)
MCV: 85.7 fL (ref 80.0–100.0)
Monocytes Absolute: 0.9 10*3/uL (ref 0.1–1.0)
Monocytes Relative: 8 %
Neutro Abs: 8.8 10*3/uL — ABNORMAL HIGH (ref 1.7–7.7)
Neutrophils Relative %: 75 %
Platelets: 204 10*3/uL (ref 150–400)
RBC: 6.42 MIL/uL — ABNORMAL HIGH (ref 4.22–5.81)
RDW: 13.7 % (ref 11.5–15.5)
WBC: 11.7 10*3/uL — ABNORMAL HIGH (ref 4.0–10.5)
nRBC: 0 % (ref 0.0–0.2)

## 2020-11-25 LAB — COMPREHENSIVE METABOLIC PANEL
ALT: 32 U/L (ref 0–44)
AST: 23 U/L (ref 15–41)
Albumin: 3.2 g/dL — ABNORMAL LOW (ref 3.5–5.0)
Alkaline Phosphatase: 78 U/L (ref 38–126)
Anion gap: 6 (ref 5–15)
BUN: 21 mg/dL — ABNORMAL HIGH (ref 6–20)
CO2: 30 mmol/L (ref 22–32)
Calcium: 8.6 mg/dL — ABNORMAL LOW (ref 8.9–10.3)
Chloride: 99 mmol/L (ref 98–111)
Creatinine, Ser: 0.62 mg/dL (ref 0.61–1.24)
GFR, Estimated: >60 mL/min (ref >60)
Glucose, Bld: 198 mg/dL — ABNORMAL HIGH (ref 70–99)
Potassium: 3.7 mmol/L (ref 3.5–5.1)
Sodium: 135 mmol/L (ref 135–145)
Total Bilirubin: 0.4 mg/dL (ref 0.3–1.2)
Total Protein: 6.5 g/dL (ref 6.5–8.1)

## 2020-11-25 LAB — GLUCOSE, CAPILLARY
Glucose-Capillary: 146 mg/dL — ABNORMAL HIGH (ref 70–99)
Glucose-Capillary: 211 mg/dL — ABNORMAL HIGH (ref 70–99)
Glucose-Capillary: 249 mg/dL — ABNORMAL HIGH (ref 70–99)
Glucose-Capillary: 252 mg/dL — ABNORMAL HIGH (ref 70–99)
Glucose-Capillary: 337 mg/dL — ABNORMAL HIGH (ref 70–99)
Glucose-Capillary: 345 mg/dL — ABNORMAL HIGH (ref 70–99)

## 2020-11-25 LAB — COOXEMETRY PANEL
Carboxyhemoglobin: 1.2 % (ref 0.5–1.5)
Methemoglobin: 0.9 % (ref 0.0–1.5)
O2 Saturation: 94.5 %
Total hemoglobin: 16.1 g/dL — ABNORMAL HIGH (ref 12.0–16.0)

## 2020-11-25 LAB — TRIGLYCERIDES: Triglycerides: 130 mg/dL (ref <150)

## 2020-11-25 MED ORDER — FUROSEMIDE 40 MG PO TABS
40.0000 mg | ORAL_TABLET | Freq: Every day | ORAL | Status: DC
  Administered 2020-11-26 – 2020-11-28 (×3): 40 mg via ORAL
  Filled 2020-11-25 (×3): qty 1

## 2020-11-25 MED ORDER — POTASSIUM CHLORIDE CRYS ER 20 MEQ PO TBCR
20.0000 meq | EXTENDED_RELEASE_TABLET | Freq: Once | ORAL | Status: AC
  Administered 2020-11-25: 20 meq via ORAL
  Filled 2020-11-25: qty 1

## 2020-11-25 MED ORDER — TRAZODONE HCL 100 MG PO TABS: 100.0000 mg | ORAL_TABLET | Freq: Every evening | ORAL | Status: DC | PRN

## 2020-11-25 MED ORDER — INSULIN ASPART 100 UNIT/ML IJ SOLN
10.0000 [IU] | Freq: Three times a day (TID) | INTRAMUSCULAR | Status: DC
  Administered 2020-11-25 – 2020-11-28 (×10): 10 [IU] via SUBCUTANEOUS

## 2020-11-25 MED ORDER — SPIRONOLACTONE 25 MG PO TABS
25.0000 mg | ORAL_TABLET | Freq: Every day | ORAL | Status: DC
  Administered 2020-11-25 – 2020-11-28 (×4): 25 mg via ORAL
  Filled 2020-11-25 (×5): qty 1

## 2020-11-25 MED ORDER — CHLORHEXIDINE GLUCONATE CLOTH 2 % EX PADS: 6.0000 | MEDICATED_PAD | Freq: Every day | CUTANEOUS | Status: DC

## 2020-11-25 MED ORDER — PREDNISONE 50 MG PO TABS
60.0000 mg | ORAL_TABLET | Freq: Every day | ORAL | Status: DC
  Administered 2020-11-26 – 2020-11-28 (×3): 60 mg via ORAL
  Filled 2020-11-25 (×3): qty 1

## 2020-11-25 NOTE — Progress Notes (Signed)
NAME:  Marc Wright, MRN:  [id number], DOB:  09-10-1975, LOS: 10 ADMISSION DATE:  11/15/2020, CONSULTATION DATE:  6/3 REFERRING MD: Mahala Menghini, CHIEF COMPLAINT:  Dyspnea   History of Present Illness:  45 y/o male admitted in setting of dyspnea, noted to have systolic heart failure and mediastinal adenopathy with findings worrisome for sarcoidosis.  Underwent bronchoscopy/EBUS on 6/1, required mechanical ventilation after. Liberated from mechanical ventilation on 6/3. Pathology from lymph nodes consistent with granulomatous inflammation.  Pertinent  Medical History  Asthma OSA on CPAP CHF DM2  Significant Hospital Events: Including procedures, antibiotic start and stop dates in addition to other pertinent events    5/25-10/2024 admitted to hospital, began diuresed, got dose of Solu-Medrol 60 mg IV x1  5/27 pulmonary consulted  5/30 Most respiratory symptoms resolved or markedly improved  5/31  -s/p [facility] w/ Dr. Shirlee Latch. ok for patient to undergo bronch/EBUS under [facility] while as inpatient based on cath results.  6/1 Underwent bronchoscopy with EBUS, on vent . 6/3 extubated  Interim History / Subjective:   Didn't sleep much Felt OK otherwise: no dyspnea, no chest pain  Objective   Blood pressure 126/88, pulse 72, temperature 98.1 F (36.7 C), temperature source Oral, resp. rate 16, height 6\' 3"  (1.905 m), weight (!) 160.3 kg, SpO2 94 %. CVP:  [5 mmHg-9 mmHg] 6 mmHg      Intake/Output Summary (Last 24 hours) at 11/25/2020 0732 Last data filed at 11/25/2020 0500 Gross per 24 hour  Intake 1027.17 ml  Output 5650 ml  Net -4622.83 ml   Filed Weights   11/23/20 0448 11/24/20 0400 11/25/20 0449  Weight: (!) 162 kg (!) 161.5 kg (!) 160.3 kg    Examination:  General:  Morbidly obese, resting comfortably in bed HENT: NCAT OP clear PULM: CTA B, normal effort CV: RRR, no mgr GI: BS+, soft, nontender MSK: normal bulk and tone Neuro: awake, alert, no distress, MAEW   Labs/imaging  that I havepersonally reviewed  (right click and "Reselect all SmartList Selections" daily)  5/27 TTE LVEF 20-25%, akinesis of basal to mid spetum and basal to mid inferior wall-> consider infiltrative cardiomyopathy, RV systolic function mildly reduced, LVH, indeterminate DD function  5/30 HRCT >>1. There are multiple dense bilateral consolidations, generally with spiculated margins and internal air bronchograms. The largest and most dense consolidations are in the bilateral superior segments of the lower lobes. These are unchanged compared to recent CT dated 11/15/2020 are slightly increased in size and density in comparison to the prior dated 11/06/2018. There may be some very fine associated nodularity. This unusual constellation of findings, in combination with enlarged mediastinal and hilar lymph nodes, suggests pulmonary sarcoidosis with progressive massive fibrosis. 2. Numerous enlarged mediastinal and bilateral hilar lymph nodes, unchanged, nonspecific, although as above, may reflect nodal sarcoidosis. 3. Cardiomegaly and coronary artery disease.  5/31 R/LHC >>  Prox RCA lesion is 100% stenosed.  Prox LAD lesion is 50% stenosed.  1. Normal filling pressures.  2. Low cardiac output. Mildly decreased by thermodilution which is likely more accurate as oxygen saturation was up and down during case so unsure of Fick accuracy.  3. Totally occluded RCA is likely chronic. There are R=>R collaterals, not L=>R collaterals. 50% stenosis proximal LAD.   Suspect mixed ischemic/nonischemic cardiomyopathy.   6/1 cMRI >> 1. Bilateral airspace disease, see dedicated chest CT from earlier this admission. 2. Severely dilated left ventricle with marked septal-lateral dyssynchrony as well as inferior severe hypokinesis and septal severe hypokinesis. Difficult  to quantify EF due to dyssynchrony, but calculated at 18%. 3. Mildly dilated RV with severe systolic dysfunction, EF 17%. 4. Poor  delayed enhancement images with significant artifact.  Probably inferior RV insertion site LGE.  6/1 LN biopsy >> FINAL MICROSCOPIC DIAGNOSIS:  A. LYMPH NODE, RLL, BIOPSIES:  - No malignant cells identified  - Granulomatous inflammation  B. LYMPH NODE, 7 NODE, FINE NEEDLE ASPIRATION:  - No malignant cells identified  - Granulomatous inflammation   Resolved Hospital Problem list   Acute hypoxemic respiratory failure Hypotension  Assessment & Plan:    Presumed pulmonary sarcoidosis with cardiac involvement Change solumedrol to 60mg  prednisone Continue methotrexate weekly, could likely increase this dose to 20mg  weekly with time if no toxicity (higher risk of toxicity if on aspirin) Continue bactrim, folate Would plan at least 12 months of methotrexate Would like to get of steroids in 3-6 months if EF improving Monitor LFT/CBC for toxicity while on therapy Will need outpatient PFT, pulm visit and optho visit  OSA CPAP qHS with nasal mask Needs outpatient sleep study  Acute on chronic systolic heart failure Mixed ischemic/non-ischemic cardiomyopathy CAD Sinus tachycardia Tele Coreg Diuresis per cardiology  digoxin  DM2> blood sugar control improved SSI glargine to continue at 45 bid Change basal aspart to 10 qAC   Morbid obesity Cardiac diet  Best practice (right click and "Reselect all SmartList Selections" daily)  Diet:  Oral Pain/Anxiety/Delirium protocol (if indicated): No VAP protocol (if indicated): Not indicated DVT prophylaxis: LMWH GI prophylaxis: N/A Glucose control:  SSI Yes Central venous access:  Yes, and it is still needed Arterial line:  N/A Foley:  Yes, and it is no longer needed Mobility:  OOB  PT consulted: Yes Last date of multidisciplinary goals of care discussion [6/1] Code Status:  full code Disposition: to tele, TRH, PCCM will follow  Labs   CBC: Recent Labs  Lab 11/20/20 0254 11/20/20 0841 11/21/20 0556 11/21/20 1550  11/22/20 0443 11/22/20 0759 11/24/20 0230 11/25/20 0314  WBC 7.3  --  6.1  --  12.4*  --  13.7* 11.7*  NEUTROABS 3.9  --  3.7  --  10.5*  --  10.6* 8.8*  HGB 18.1*   < > 19.4* 17.7* 18.9* 19.0* 17.6* 17.8*  HCT 55.9*   < > 58.5* 52.0 58.0* 56.0* 53.6* 55.0*  MCV 86.4  --  85.5  --  86.2  --  86.3 85.7  PLT 281  --  231  --  320  --  198 204   < > = values in this interval not displayed.    Basic Metabolic Panel: Recent Labs  Lab 11/20/20 0254 11/20/20 0841 11/21/20 0556 11/21/20 1550 11/22/20 0443 11/22/20 0759 11/22/20 1107 11/22/20 1635 11/23/20 0450 11/24/20 0230 11/24/20 0912 11/25/20 0314  NA 134*   < > 136   < > 132* 137  --   --  126* 135  --  135  K 4.6   < > 3.9   < > 4.8 4.5  --   --  4.8 4.0  --  3.7  CL 99  --  103  --  101  --   --   --  96* 99  --  99  CO2 25  --  23  --  19*  --   --   --  18* 28  --  30  GLUCOSE 200*  --  153*  --  245*  --   --   --  349* 162*  --  198*  BUN 19  --  17  --  27*  --   --   --  28* 28*  --  21*  CREATININE 0.82  --  0.75  --  0.97  --   --   --  0.71 0.64  --  0.62  CALCIUM 9.2  --  9.3  --  8.7*  --   --   --  7.3* 8.8*  --  8.6*  MG 2.0  --   --   --   --   --  1.4* 1.4* 2.3  --  1.9  --   PHOS  --   --   --   --   --   --  2.3* 2.2* 3.0  --   --   --    < > = values in this interval not displayed.   GFR: Estimated Creatinine Clearance: 185.4 mL/min (by C-G formula based on SCr of 0.62 mg/dL). Recent Labs  Lab 11/21/20 0556 11/22/20 0443 11/24/20 0230 11/25/20 0314  WBC 6.1 12.4* 13.7* 11.7*    Liver Function Tests: Recent Labs  Lab 11/20/20 0254 11/21/20 0556 11/22/20 0443 11/24/20 0230 11/25/20 0314  AST 32 27 20 18 23   ALT 31 42 43 26 32  ALKPHOS 95 96 91 85 78  BILITOT 1.0 0.6 0.9 0.8 0.4  PROT 6.6 6.5 6.7 6.3* 6.5  ALBUMIN 3.3* 3.3* 3.4* 3.3* 3.2*   No results for input(s): LIPASE, AMYLASE in the last 168 hours. No results for input(s): AMMONIA in the last 168 hours.  ABG    Component Value  Date/Time   PHART 7.279 (L) 11/22/2020 0759   PCO2ART 40.8 11/22/2020 0759   PO2ART 68 (L) 11/22/2020 0759   HCO3 19.2 (L) 11/22/2020 0759   TCO2 20 (L) 11/22/2020 0759   ACIDBASEDEF 7.0 (H) 11/22/2020 0759   O2SAT 94.5 11/25/2020 0314     Coagulation Profile: No results for input(s): INR, PROTIME in the last 168 hours.  Cardiac Enzymes: No results for input(s): CKTOTAL, CKMB, CKMBINDEX, TROPONINI in the last 168 hours.  HbA1C: Hgb A1c MFr Bld  Date/Time Value Ref Range Status  11/16/2020 04:21 AM 11.5 (H) 4.8 - 5.6 % Final    Comment:    (NOTE)         Prediabetes: 5.7 - 6.4         Diabetes: >6.4         Glycemic control for adults with diabetes: <7.0   11/16/2020 12:09 AM 11.5 (H) 4.8 - 5.6 % Final    Comment:    (NOTE)         Prediabetes: 5.7 - 6.4         Diabetes: >6.4         Glycemic control for adults with diabetes: <7.0     CBG: Recent Labs  Lab 11/24/20 1529 11/24/20 2125 11/25/20 0052 11/25/20 0449 11/25/20 0710  GLUCAP 398* 358* 249* 211* 146*     Critical care time: n/a      01/25/21, MD [place] [facility] Pager: [phone] Cell: [phone] If no response, please call [phone] until 7pm After 7:00 pm call Elink  [phone]

## 2020-11-25 NOTE — Progress Notes (Signed)
RT spoke to patient about wearing CPAP.  Patient states that he doesn't wear CPAP at home but would be willing to wear the CPAP in the hospital.  Patient states he can't handle the nasal mask because of his sinuses being dry.  RT explained to patient that policy doesn't allow for a full face mask unless he wears the CPAP at home.  Explained will talk to MD about writing orders for him to have a FFM while in the hospital.  Patient was placed on 2 L nasal cannula and RN was made aware.

## 2020-11-25 NOTE — Progress Notes (Signed)
Patient ID: Marc Wright, male   DOB: 05/11/1976, 45 y.o.   MRN: [id number]     Advanced Heart Failure Rounding Note  PCP-Cardiologist: None   Subjective:    Developed bronchospasm/hypotension with bronchoscopy/biopsy on 6/1, intubated. Started on Solumedrol.  Extubated 6/3, off pressors.   CVP 4 today, good diuresis again yesterday.  No dyspnea.   Cardiac MRI: 1. Bilateral airspace disease, see dedicated chest CT from earlier this admission. 2. Severely dilated left ventricle with marked septal-lateral dyssynchrony as well as inferior severe hypokinesis and septal severe hypokinesis. Difficult to quantify EF due to dyssynchrony, but calculated at 18%. 3. Mildly dilated RV with severe systolic dysfunction, EF 17%. 4. Poor delayed enhancement images with significant artifact. Probably inferior RV insertion site LGE (nonspecific).  RHC/LHC: Coronary Findings   Diagnostic Dominance: Right  Left Anterior Descending  Prox LAD lesion is 50% stenosed.  Right Coronary Artery  Prox RCA lesion is 100% stenosed. There are weak right-right collaterals, no left-right collaterals.   Intervention   No interventions have been documented.  Right Heart  Right Heart Pressures RHC Procedural Findings: Hemodynamics (mmHg) RA mean 2 RV 31/3 PA 30/15, mean 21 PCWP mean 8 LV 101/15 AO 100/71  Oxygen saturations: PA 60% AO 93%  Cardiac Output (Fick) 4.6  Cardiac Index (Fick) 1.64   Cardiac Output (Thermo) 5.98  Cardiac Index (Thermo) 2.13      Objective:   Weight Range: (!) 160.3 kg Body mass index is 44.17 kg/m.   Vital Signs:   Temp:  [97.6 F (36.4 C)-99 F (37.2 C)] 98.1 F (36.7 C) (06/05 0700) Pulse Rate:  [70-101] 70 (06/05 0800) Resp:  [8-26] 16 (06/05 0800) BP: (105-173)/(71-114) 160/94 (06/05 0800) SpO2:  [93 %-100 %] 96 % (06/05 0800) Arterial Line BP: (67-134)/(61-81) 67/61 (06/04 1800) Weight:  [160.3 kg] 160.3 kg (06/05 0449) Last BM Date:  11/22/20  Weight change: Filed Weights   11/23/20 0448 11/24/20 0400 11/25/20 0449  Weight: (!) 162 kg (!) 161.5 kg (!) 160.3 kg    Intake/Output:   Intake/Output Summary (Last 24 hours) at 11/25/2020 0946 Last data filed at 11/25/2020 0800 Gross per 24 hour  Intake 907.17 ml  Output 5975 ml  Net -5067.83 ml      Physical Exam  CVP 4 General: NAD Neck: Thick. No JVD, no thyromegaly or thyroid nodule.  Lungs: Crackles at bases.  CV: Nondisplaced PMI.  Heart regular S1/S2, no S3/S4, no murmur.  No peripheral edema.  Abdomen: Soft, nontender, no hepatosplenomegaly, no distention.  Skin: Intact without lesions or rashes.  Neurologic: Alert and oriented x 3.  Psych: Normal affect. Extremities: No clubbing or cyanosis.  HEENT: Normal.    Telemetry   SR with 1st degree block (personally reviewed).    Labs    CBC Recent Labs    11/24/20 0230 11/25/20 0314  WBC 13.7* 11.7*  NEUTROABS 10.6* 8.8*  HGB 17.6* 17.8*  HCT 53.6* 55.0*  MCV 86.3 85.7  PLT 198 204   Basic Metabolic Panel Recent Labs    16/07/37 1635 11/22/20 1635 11/23/20 0450 11/24/20 0230 11/24/20 0912 11/25/20 0314  NA  --    < > 126* 135  --  135  K  --    < > 4.8 4.0  --  3.7  CL  --    < > 96* 99  --  99  CO2  --    < > 18* 28  --  30  GLUCOSE  --    < >  349* 162*  --  198*  BUN  --    < > 28* 28*  --  21*  CREATININE  --    < > 0.71 0.64  --  0.62  CALCIUM  --    < > 7.3* 8.8*  --  8.6*  MG 1.4*  --  2.3  --  1.9  --   PHOS 2.2*  --  3.0  --   --   --    < > = values in this interval not displayed.   Liver Function Tests Recent Labs    11/24/20 0230 11/25/20 0314  AST 18 23  ALT 26 32  ALKPHOS 85 78  BILITOT 0.8 0.4  PROT 6.3* 6.5  ALBUMIN 3.3* 3.2*   No results for input(s): LIPASE, AMYLASE in the last 72 hours. Cardiac Enzymes No results for input(s): CKTOTAL, CKMB, CKMBINDEX, TROPONINI in the last 72 hours.  BNP: BNP (last 3 results) Recent Labs    11/15/20 1556  BNP  598.1*    ProBNP (last 3 results) No results for input(s): PROBNP in the last 8760 hours.   D-Dimer No results for input(s): DDIMER in the last 72 hours. Hemoglobin A1C No results for input(s): HGBA1C in the last 72 hours. Fasting Lipid Panel Recent Labs    11/25/20 0314  TRIG 130   Thyroid Function Tests No results for input(s): TSH, T4TOTAL, T3FREE, THYROIDAB in the last 72 hours.  Invalid input(s): FREET3  Other results:   Imaging    No results found.   Medications:     Scheduled Medications: . aspirin EC  81 mg Oral Daily  . carvedilol  3.125 mg Oral BID WC  . Chlorhexidine Gluconate Cloth  6 each Topical Daily  . digoxin  0.125 mg Oral Daily  . enoxaparin (LOVENOX) injection  80 mg Subcutaneous Q24H  . fluticasone  2 spray Each Nare Daily  . folic acid  1 mg Oral Daily  . [START ON 11/26/2020] furosemide  40 mg Oral Daily  . insulin aspart  0-20 Units Subcutaneous TID WC  . insulin aspart  0-5 Units Subcutaneous QHS  . insulin aspart  10 Units Subcutaneous TID WC  . insulin glargine  45 Units Subcutaneous BID  . mouth rinse  15 mL Mouth Rinse BID  . methotrexate  15 mg Oral Weekly  . [START ON 11/26/2020] predniSONE  60 mg Oral Q breakfast  . rosuvastatin  20 mg Oral Daily  . sacubitril-valsartan  1 tablet Oral BID  . sodium chloride flush  10-40 mL Intracatheter Q12H  . spironolactone  25 mg Oral Daily  . sulfamethoxazole-trimethoprim  1 tablet Oral Once per day on Mon Wed Fri    Infusions: . sodium chloride 10 mL/hr at 11/24/20 1600  . feeding supplement (VITAL AF 1.2 CAL) 1,000 mL (11/22/20 1241)    PRN Medications: HYDROcodone-acetaminophen, ipratropium-albuterol, nitroGLYCERIN, sodium chloride, sodium chloride flush, traZODone   Assessment/Plan   1. 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 He has a wide LBBB. Echo this admission with basal-mid inferior akinesis, dyssynchrony from LBBB, EF 20-25%, moderate LVH,  mildly decreased RV function. RHC/LHC with normal filling pressures, marginal CI (2.1 by thermodilution is likely more accurate than Fick); cath showed 50% proximal LAD and occluded RCA (chronic).  Extent of cardiomyopathy is not explained by coronary disease, suspect there is a nonischemic component likely from cardiac sarcoidosis. Cardiac MRI showed only nonspecific RV insertion site LGE (findings were not highly suggestive  of cardiac sarcoidosis), but study was technically difficult.  Today, CVP 4, diuresed well yesterday. - Continue low dose Coreg 3.125 mg bid (watch with 1st degree AVB).   - Stop IV Lasix, start Lasix 40 mg po daily tomorrow.  - Continue digoxin 0.125, follow closely with 1st degree AVB.  - Continue Entresto 24/26 bid.   - Start spironolactone 25 mg daily.  - Will not add SGLT2 inhibitor yet with uncontrolled diabetes.  - Needs EP evaluation eventually for CRT-D placement (has had low EF since 2018, has wide LBBB, high risk for arrhythmias with suspected cardiac sarcoidosis).  Would like to have this done prior to discharge => will ask EP to see tomorrow.  - Will eventually need cardiac PET for assessment of cardiac involvement by sarcoidosis (will need to be done at [facility] as outpatient).  2. CAD: Presentation with CHF, not ACS.  LHC with 50% pLAD, totally occluded proximal RCA with R=>R collaterals (old occlusion).  Reviewed with interventional cardiology, would hold off on PCI attempt at this time as CTO.  - ASA 81 daily - Crestor 20 mg daily.  3. OHS/OSA: Needs CPAP, on oxygen during the day. 4. Sarcoidosis: High resolution CT chest showed dense bilateral consolidations and enlarged hilar/mediastinal nodes.  Does not appear infectious.  Sarcoid is likely unifying diagnosis for heart and lungs. ACE level elevated.  He has had bronch/biopsy, path suggestive of sarcoidosis.  Intubated post-bronch with bronchospasm/hypotension and now extubated.  - Starting prednisone 60 mg daily,  slow taper over months.  - Bactrim for PJP prophylaxis. - Starting weekly MTX for steroid sparing, continue long-term.  5. Smoking: Needs to quit.   6. Type 2 diabetes: Per primary service.  7. NSVT/PVCs: Stable.  - Continue Coreg. - Will eventually need ICD as above.   Mobilize.   Length of Stay: 10  Marca Ancona, MD  11/25/2020, 9:46 AM  Advanced Heart Failure Team Pager [phone] (M-F; 7a - 5p)  Please contact [facility] for night-coverage after hours (5p -7a ) and weekends on [email or web address]

## 2020-11-26 DIAGNOSIS — I447 Left bundle-branch block, unspecified: Secondary | ICD-10-CM

## 2020-11-26 DIAGNOSIS — I440 Atrioventricular block, first degree: Secondary | ICD-10-CM

## 2020-11-26 DIAGNOSIS — R0902 Hypoxemia: Secondary | ICD-10-CM

## 2020-11-26 LAB — CBC WITH DIFFERENTIAL/PLATELET
Abs Immature Granulocytes: 0.04 10*3/uL (ref 0.00–0.07)
Basophils Absolute: 0.1 10*3/uL (ref 0.0–0.1)
Basophils Relative: 1 %
Eosinophils Absolute: 0.3 10*3/uL (ref 0.0–0.5)
Eosinophils Relative: 3 %
HCT: 58.5 % — ABNORMAL HIGH (ref 39.0–52.0)
Hemoglobin: 18.8 g/dL — ABNORMAL HIGH (ref 13.0–17.0)
Immature Granulocytes: 0 %
Lymphocytes Relative: 28 %
Lymphs Abs: 2.7 10*3/uL (ref 0.7–4.0)
MCH: 27.7 pg (ref 26.0–34.0)
MCHC: 32.1 g/dL (ref 30.0–36.0)
MCV: 86.2 fL (ref 80.0–100.0)
Monocytes Absolute: 0.7 10*3/uL (ref 0.1–1.0)
Monocytes Relative: 7 %
Neutro Abs: 5.8 10*3/uL (ref 1.7–7.7)
Neutrophils Relative %: 61 %
Platelets: 206 10*3/uL (ref 150–400)
RBC: 6.79 MIL/uL — ABNORMAL HIGH (ref 4.22–5.81)
RDW: 14.2 % (ref 11.5–15.5)
WBC: 9.5 10*3/uL (ref 4.0–10.5)
nRBC: 0 % (ref 0.0–0.2)

## 2020-11-26 LAB — GLUCOSE, CAPILLARY
Glucose-Capillary: 123 mg/dL — ABNORMAL HIGH (ref 70–99)
Glucose-Capillary: 198 mg/dL — ABNORMAL HIGH (ref 70–99)
Glucose-Capillary: 209 mg/dL — ABNORMAL HIGH (ref 70–99)
Glucose-Capillary: 303 mg/dL — ABNORMAL HIGH (ref 70–99)

## 2020-11-26 LAB — COOXEMETRY PANEL
Carboxyhemoglobin: 1.1 % (ref 0.5–1.5)
Methemoglobin: 0.8 % (ref 0.0–1.5)
O2 Saturation: 68.5 %
Total hemoglobin: 19.3 g/dL — ABNORMAL HIGH (ref 12.0–16.0)

## 2020-11-26 LAB — COMPREHENSIVE METABOLIC PANEL
ALT: 42 U/L (ref 0–44)
AST: 25 U/L (ref 15–41)
Albumin: 3.2 g/dL — ABNORMAL LOW (ref 3.5–5.0)
Alkaline Phosphatase: 85 U/L (ref 38–126)
Anion gap: 5 (ref 5–15)
BUN: 20 mg/dL (ref 6–20)
CO2: 33 mmol/L — ABNORMAL HIGH (ref 22–32)
Calcium: 8.9 mg/dL (ref 8.9–10.3)
Chloride: 99 mmol/L (ref 98–111)
Creatinine, Ser: 0.79 mg/dL (ref 0.61–1.24)
GFR, Estimated: >60 mL/min (ref >60)
Glucose, Bld: 174 mg/dL — ABNORMAL HIGH (ref 70–99)
Potassium: 3.6 mmol/L (ref 3.5–5.1)
Sodium: 137 mmol/L (ref 135–145)
Total Bilirubin: 0.8 mg/dL (ref 0.3–1.2)
Total Protein: 6.6 g/dL (ref 6.5–8.1)

## 2020-11-26 LAB — MPO/PR-3 (ANCA) ANTIBODIES
ANCA Proteinase 3: <3.5 U/mL (ref 0.0–3.5)
Myeloperoxidase Abs: <9 U/mL (ref 0.0–9.0)

## 2020-11-26 MED ORDER — METHOTREXATE 2.5 MG PO TABS: 20.0000 mg | ORAL_TABLET | ORAL | Status: DC

## 2020-11-26 MED ORDER — CHLORHEXIDINE GLUCONATE CLOTH 2 % EX PADS
6.0000 | MEDICATED_PAD | CUTANEOUS | Status: AC
  Administered 2020-11-27: 6 via TOPICAL

## 2020-11-26 MED ORDER — ADULT MULTIVITAMIN W/MINERALS CH
1.0000 | ORAL_TABLET | Freq: Every day | ORAL | Status: DC
  Administered 2020-11-26 – 2020-11-28 (×3): 1 via ORAL
  Filled 2020-11-26 (×3): qty 1

## 2020-11-26 NOTE — Progress Notes (Addendum)
[facility] PROGRESS NOTE    Marc Wright  VQX:[id number] DOB: Mar 28, 1976 DOA: 11/15/2020 PCP: Patient, No Pcp Per (Inactive)      Brief Narrative:  This is a 45 y.o. male patient with a history of chronic systolic CHF, hypertension, obstructive sleep apnea on CPAP, diabetes mellitus type 2 and tobacco abuse was admitted on 11/15/2020 after he presented with shortness of breath.  Patient had bronchoscopy on 11/21/2020, extubated on 11/23/2020.    Past Medical History:  Diagnosis Date  . Asthma   . CHF (congestive heart failure) (HCC)   . Diabetes mellitus without complication (HCC)   . OSA on CPAP   . Pneumonia 06/2016      Assessment & Plan:  Pulmonary sarcoidosis with cardiac involvement. Currently on 60 mg of prednisone daily to be slowly tapered down as an outpatient.  He is also on methotrexate and Bactrim 3 times a week for PJP prophylaxis.  Acute on chronic systolic heart failure With mixed ischemic and nonischemic cardiomyopathy.  Currently on furosemide and Coreg.  He is also on Entresto.  Cardiology follow-up is appreciated.  2D echocardiogram showed left ventricular ejection fraction of 20 to 25%.  EP consulted.  Obstructive sleep apnea on CPAP nightly.  Diabetes mellitus type 2. Continue with Lantus 45 units twice daily as well as aspart insulin 3 times daily with meals.         [name] aspirin EC  81 mg Oral Daily  . carvedilol  3.125 mg Oral BID WC  . Chlorhexidine Gluconate Cloth  6 each Topical Q0600  . digoxin  0.125 mg Oral Daily  . enoxaparin (LOVENOX) injection  80 mg Subcutaneous Q24H  . fluticasone  2 spray Each Nare Daily  . folic acid  1 mg Oral Daily  . furosemide  40 mg Oral Daily  . insulin aspart  0-20 Units Subcutaneous TID WC  . insulin aspart  0-5 Units Subcutaneous QHS  . insulin aspart  10 Units Subcutaneous TID WC  . insulin glargine  45 Units Subcutaneous BID  . mouth rinse  15 mL Mouth Rinse BID  . [START ON  11/30/2020] methotrexate  20 mg Oral Weekly  . predniSONE  60 mg Oral Q breakfast  . rosuvastatin  20 mg Oral Daily  . sacubitril-valsartan  1 tablet Oral BID  . sodium chloride flush  10-40 mL Intracatheter Q12H  . spironolactone  25 mg Oral Daily  . sulfamethoxazole-trimethoprim  1 tablet Oral Once per day on Mon Wed Fri     Current Meds  Medication Sig  . cetirizine (ZYRTEC) 10 MG tablet Take 10 mg by mouth daily.  [name] ibuprofen (ADVIL) 200 MG tablet Take 200 mg by mouth every 6 (six) hours as needed for moderate pain.        Disposition: Status is: Inpatient  Remains inpatient appropriate because:Ongoing diagnostic testing needed not appropriate for outpatient work up   Dispo: The patient is from: Home              Anticipated d/c is to: Home              Patient currently is not medically stable to d/c.   Difficult to place patient No              MDM: The below labs and imaging reports were reviewed and summarized above.  Medication management as above.     DVT prophylaxis:       Consultants:   Cardiology,  critical care.  Procedures:   Status post bronchoscopy           Subjective: As noted above patient currently reports that he somewhat feels better but has not been getting out of bed except to transfer from bed to chair and vice versa.  Denies shortness of breath at rest.  Objective: Vitals:   11/25/20 2335 11/26/20 0215 11/26/20 0312 11/26/20 0753  BP: (!) 125/96  130/90 136/82  Pulse: 100  93 99  Resp: 19 (!) 23 18 19   Temp: 98.9 F (37.2 C)  98.2 F (36.8 C) 98 F (36.7 C)  TempSrc: Oral  Oral Oral  SpO2: 93%  94% 95%  Weight:  (!) 158.8 kg    Height:        Intake/Output Summary (Last 24 hours) at 11/26/2020 1501 Last data filed at 11/26/2020 1300 Gross per 24 hour  Intake 1040 ml  Output 2015 ml  Net -975 ml   Filed Weights   11/24/20 0400 11/25/20 0449 11/26/20 0215  Weight: (!) 161.5 kg (!) 160.3 kg (!) 158.8  kg    Examination: General appearance: Lying in bed, in no form of pain or respiratory distress. General: Not in obvious distress, alert awake . HENT:   No scleral pallor or icterus noted. Oral mucosa is moist.  Chest:   Clear to auscultation bilaterally except few rales at the bases. CVS: S1 &S2 heard. No murmur.  Regular rate and rhythm. Abdomen: Soft, nontender, nondistended.  Bowel sounds are normoactive. Extremities: No cyanosis, or edema.   Psych: Alert, awake.  Normal mood and affect CNS:  No cranial nerve deficits.  Power equal in all extremities.   Skin: Warm and dry.  No rashes noted.   Data Reviewed: I have personally reviewed following labs and imaging studies:  CBC: Recent Labs  Lab 11/21/20 0556 11/21/20 1550 11/22/20 0443 11/22/20 0759 11/24/20 0230 11/25/20 0314 11/26/20 0430  WBC 6.1  --  12.4*  --  13.7* 11.7* 9.5  NEUTROABS 3.7  --  10.5*  --  10.6* 8.8* 5.8  HGB 19.4*   < > 18.9* 19.0* 17.6* 17.8* 18.8*  HCT 58.5*   < > 58.0* 56.0* 53.6* 55.0* 58.5*  MCV 85.5  --  86.2  --  86.3 85.7 86.2  PLT 231  --  320  --  198 204 206   < > = values in this interval not displayed.   Basic Metabolic Panel: Recent Labs  Lab 11/20/20 0254 11/20/20 0841 11/22/20 0443 11/22/20 0759 11/22/20 1107 11/22/20 1635 11/23/20 0450 11/24/20 0230 11/24/20 0912 11/25/20 0314 11/26/20 0430  NA 134*   < > 132* 137  --   --  126* 135  --  135 137  K 4.6   < > 4.8 4.5  --   --  4.8 4.0  --  3.7 3.6  CL 99   < > 101  --   --   --  96* 99  --  99 99  CO2 25   < > 19*  --   --   --  18* 28  --  30 33*  GLUCOSE 200*   < > 245*  --   --   --  349* 162*  --  198* 174*  BUN 19   < > 27*  --   --   --  28* 28*  --  21* 20  CREATININE 0.82   < > 0.97  --   --   --  0.71 0.64  --  0.62 0.79  CALCIUM 9.2   < > 8.7*  --   --   --  7.3* 8.8*  --  8.6* 8.9  MG 2.0  --   --   --  1.4* 1.4* 2.3  --  1.9  --   --   PHOS  --   --   --   --  2.3* 2.2* 3.0  --   --   --   --    < > =  values in this interval not displayed.   GFR: Estimated Creatinine Clearance: 184.4 mL/min (by C-G formula based on SCr of 0.79 mg/dL). Liver Function Tests: Recent Labs  Lab 11/21/20 0556 11/22/20 0443 11/24/20 0230 11/25/20 0314 11/26/20 0430  AST 27 20 18 23 25   ALT 42 43 26 32 42  ALKPHOS 96 91 85 78 85  BILITOT 0.6 0.9 0.8 0.4 0.8  PROT 6.5 6.7 6.3* 6.5 6.6  ALBUMIN 3.3* 3.4* 3.3* 3.2* 3.2*   No results for input(s): LIPASE, AMYLASE in the last 168 hours. No results for input(s): AMMONIA in the last 168 hours. Coagulation Profile: No results for input(s): INR, PROTIME in the last 168 hours. Cardiac Enzymes: No results for input(s): CKTOTAL, CKMB, CKMBINDEX, TROPONINI in the last 168 hours. BNP (last 3 results) No results for input(s): PROBNP in the last 8760 hours. HbA1C: No results for input(s): HGBA1C in the last 72 hours. CBG: Recent Labs  Lab 11/25/20 1128 11/25/20 1710 11/25/20 2057 11/26/20 0608 11/26/20 1206  GLUCAP 345* 252* 337* 123* 198*   Lipid Profile: Recent Labs    11/25/20 0314  TRIG 130   Thyroid Function Tests: No results for input(s): TSH, T4TOTAL, FREET4, T3FREE, THYROIDAB in the last 72 hours. Anemia Panel: No results for input(s): VITAMINB12, FOLATE, FERRITIN, TIBC, IRON, RETICCTPCT in the last 72 hours. Urine analysis:    Component Value Date/Time   COLORURINE YELLOW 11/15/2020 2259   APPEARANCEUR CLEAR 11/15/2020 2259   LABSPEC 1.038 (H) 11/15/2020 2259   PHURINE 6.0 11/15/2020 2259   GLUCOSEU >=500 (A) 11/15/2020 2259   HGBUR NEGATIVE 11/15/2020 2259   BILIRUBINUR NEGATIVE 11/15/2020 2259   KETONESUR NEGATIVE 11/15/2020 2259   PROTEINUR 100 (A) 11/15/2020 2259   NITRITE NEGATIVE 11/15/2020 2259   LEUKOCYTESUR NEGATIVE 11/15/2020 2259   Sepsis Labs: @LABRCNTIP (procalcitonin:4,lacticacidven:4)  ) Recent Results (from the past 240 hour(s))  MRSA PCR Screening     Status: None   Collection Time: 11/21/20  3:03 PM    Specimen: Nasopharyngeal  Result Value Ref Range Status   MRSA by PCR NEGATIVE NEGATIVE Final    Comment:        The GeneXpert MRSA Assay (FDA approved for NASAL specimens only), is one component of a comprehensive MRSA colonization surveillance program. It is not intended to diagnose MRSA infection nor to guide or monitor treatment for MRSA infections. Performed at [facility], [address] [place], [place] [address]          Radiology Studies: No results found.      Scheduled Meds: . aspirin EC  81 mg Oral Daily  . carvedilol  3.125 mg Oral BID WC  . Chlorhexidine Gluconate Cloth  6 each Topical Q0600  . digoxin  0.125 mg Oral Daily  . enoxaparin (LOVENOX) injection  80 mg Subcutaneous Q24H  . fluticasone  2 spray Each Nare Daily  . folic acid  1 mg Oral Daily  . furosemide  40 mg  Oral Daily  . insulin aspart  0-20 Units Subcutaneous TID WC  . insulin aspart  0-5 Units Subcutaneous QHS  . insulin aspart  10 Units Subcutaneous TID WC  . insulin glargine  45 Units Subcutaneous BID  . mouth rinse  15 mL Mouth Rinse BID  . [START ON 11/30/2020] methotrexate  20 mg Oral Weekly  . predniSONE  60 mg Oral Q breakfast  . rosuvastatin  20 mg Oral Daily  . sacubitril-valsartan  1 tablet Oral BID  . sodium chloride flush  10-40 mL Intracatheter Q12H  . spironolactone  25 mg Oral Daily  . sulfamethoxazole-trimethoprim  1 tablet Oral Once per day on Mon Wed Fri   Continuous Infusions: . sodium chloride 10 mL/hr at 11/24/20 1600     LOS: 11 days    Time spent: 35 minutes    Dalena Plantz Arvella Merles, MD [facility] 11/26/2020, 3:01 PM     Please page though AMION or Epic secure chat:  For [employer], Higher education careers adviser

## 2020-11-26 NOTE — Progress Notes (Signed)
NAME:  PAXTEN APPELT, MRN:  [id number], DOB:  [birth year], LOS: 11 ADMISSION DATE:  11/15/2020, CONSULTATION DATE:  11/26/20 REFERRING MD:  [facility] CHIEF COMPLAINT: Shortness of breath  History of Present Illness:  45 year old whom we are seeing in consultation for evaluation of abnormal chest imaging hypoxemia.  ED and H&P notes reviewed.  Patient is 1 week history of worsening dyspnea on exertion.  Associated with increased nasal congestion.  Developed shortness of breath at rest.  Shortness of breath worse when lying down, improves when sitting up.  Noted increasing lower extremity swelling more so on the left leg than the right over the same timeframe.  Denies respiratory issues with pollen earlier in the spring.  Denies any wheezing.  Worsened over time which prompted presentation to ED.  There BNP elevated.  Chest x-ray congested.  CT scan with bilateral) right greater than left pleural effusions with mild interlobular septal thickening.  Had several episodes of infiltrate most notably in bilateral lower lobes.  Reviewed CT scan 2020 monitor patient with similar appearing infiltrates although more prominent this admission.  No fever or chills.  Does have mild cough, sounds almost blood-tinged.  Reviewed labs extensively no leukocytosis, no fever here, procalcitonin negative x2.  Received Lasix.  He is feeling better.  Weaning oxygen.  Notes he desats when he sleeps but has known sleep apnea.  Is not surprising.  Pertinent  Medical History  Cardiomyopathy, asthma, seasonal allergies  Significant Hospital Events: Including procedures, antibiotic start and stop dates in addition to other pertinent events   . 5/25-10/2024 admitted to hospital, began diuresed, got dose of Solu-Medrol 60 mg IV x1 . 5/27 pulmonary consulted . 5/30 Most respiratory symptoms resolved or markedly improved with diuresis . 6/1 bronch, shock left intubated . 6/3 extubated . 6/6 Doing ok, steroids continue as does  MTX  Interim History / Subjective:  NAEON. Waiting to talk about ICD.  Objective   Blood pressure 136/82, pulse 99, temperature 98 F (36.7 C), temperature source Oral, resp. rate 19, height 6' 3"  (1.905 m), weight (!) 158.8 kg, SpO2 95 %. CVP:  [3 mmHg-14 mmHg] 5 mmHg      Intake/Output Summary (Last 24 hours) at 11/26/2020 0945 Last data filed at 11/26/2020 0314 Gross per 24 hour  Intake 1390 ml  Output 1900 ml  Net -510 ml   Filed Weights   11/24/20 0400 11/25/20 0449 11/26/20 0215  Weight: (!) 161.5 kg (!) 160.3 kg (!) 158.8 kg    Examination: General: sitting in bed, no acute distress Eyes: EOMI, Cardiovascular: Regular rhythm, no murmu Pulmonary: No wheeze, distant, difficult to auscultate Abdomen: Nondistended, bowel sounds present    Labs/imaging that I havepersonally reviewed    CXR, CT chest, CBC, chem, CRP, ESR, procalcitonin, LHC, RHC, cardiac MRI  Resolved Hospital Problem list   n/a  Assessment & Plan:  Persistent bilateral infiltrates and LAD due to sarcoidosis: Subacute to acute onset of SOB in setting of mild volume overload. Similar albeit slightly more prominent infiltrates compared to 2020. Central location could be consistent with pulmonary edema. Rounded atelectasis considered. Possible organizing pneumonia or sarcoid. Procalcitionin negative, no fever, no luekocytosis - infection felt unlikely. EBUS with granulomas, suspect sarcoidosis. Links well with concern for infiltrative cardiomyopathy. --Continue prednisone 60 mg daily and Bactrim MWF for PJP ppx  --Continue MTX and folate, increase to 20 mg weekly  --Agree with outpatient cardiac PET - if not active inflammation and more related to fibrosis would favor rapid decrease  to prednisone 20 mg daily x 3 months with subsequent taper  to achieve pulmonary remission of infiltrates with MTX long term; if active inflammation on PET then higher doses of steroid and longer taper would be warranted  --Agree  with ICD insertion  --Will arrange pulmonary follow up in the next month  DOE, Hypoxemia: Likely multifactorial. Desats when asleep - known OSA not surprising. Bicarb elevated indicative of likely OHS. SOB/DOE improving with diuresis.  Current and h/o polycythemia. Hypoxemia likely chronic and related to OHS/OSA.  OSA: Diagnosed in past, did not tolerate CPAP. --Night time O2, can repeat PSG as outpatient   We will sign off.   Best practice (right click and "Reselect all SmartList Selections" daily)  Per primary   Critical care time: n/a     Lanier Clam, MD See Amion for contact info

## 2020-11-26 NOTE — Consult Note (Addendum)
Cardiology Consultation:   Patient ID: Marc Wright MRN: [id number]; DOB: 02-20-1976  Admit date: 11/15/2020 Date of Consult: 11/26/2020  PCP:  Patient, No Pcp Per (Inactive)   [facility] Providers Cardiologist:  Dr. Juliann Paresallwood [facility])   Patient Profile:   Marc Wright is a 45 y.o. male with a hx of HTN, OSA w/CPAP, DM, smoker, + ETOH (3-4 drinks/week) (remote cocaine use), chronic CHF (systolic), CAD, CM (mixed), pulmonary sarcoid and concers of cardiac sarcoid as well, who is being seen 11/26/2020 to consider ICD at the request of Dr. Shirlee LatchMcLean.  History of Present Illness:   Marc Wright in review of record/notes  chronic systolic heart failure, [facility] [place] but recently lost to f/u. Not seen there since 2020.  Systolic HF dates back to 2018. Echo at that time showed reduced LVEF 25-30% and mild MR. Per chart, no ischemic w/u pursued. Has been on Coreg and lisinopril. He failed Entresto (did not tolerate due to severe fatigue)  [facility] w/ complaints of SOB, pleuritic CP and LEE. EKG showed junctional tachycardia, 105 bpm. Mildy hyperkalemia at 5.4. Was markedly hypertensive, BP 227/136. Admitted to poor med compliance. Also not using CPAP. HS trop 35>>30>>37. BNP 598. Respiratory Panel both COVID and Flu negative. CXR w/ progressive opacity in the right hilar and infrahilar region. CTA of chest negative for PE. + findings for sarcoid. There was concern for possible underlying bacterial PNA. PCT however <0.10 and he was afebrile w/ normal WBCs.    He was admitted by IM and placed on IV Lasix + antibiotics w/ doxycycline and Rocephin + Solu-Medrol. Cardiology and pulmonology consulted. He is now on GDMT w/ Entresto, spiro, coreg and digoxin. BP improved. I/Os net negative 14L.    Echo this admission noted severely reduced LVEF, 20-25%, RV mildly reduced. Also seen to have akinesis of the basal to mid septum and basal to  mid inferior wall w/ septal movement c/w LBBB. Given usual pattern of akinesis for coronary distribution, there is concern for infiltrative CM, such as sarcoid.    LHC 11/20/20 showed Prox RCA lesion 100% stenosed, felt to be CTO. There are R=>R collaterals, not L=>R collaterals. The prox LAD is 50% stenosed. Findings c/w mixed ischemic/ nonischemic CM. Plan to treat CAD medically. RHC showed normal filling pressures, RA pressure 2, mPCWP 8. CI 2.13 by Thermo. PA sat 60%.  HF team was consulted.  Bronch done 11/21/20: Biopsy from bronchoscopy is suggestive of sarcoidosis, pulmonary notes: Pathology from lymph nodes consistent with granulomatous inflammation. Presumed pulmonary sarcoidosis with cardiac involvement Change solumedrol to 60mg  prednisone Continue methotrexate weekly, could likely increase this dose to 20mg  weekly with time if no toxicity (higher risk of toxicity if on aspirin) Continue bactrim, folate Would plan at least 12 months of methotrexate Would like to get of steroids in 3-6 months if EF improving   Cardiac MRI: noted; 1. Bilateral airspace disease, see dedicated chest CT from earlier this admission. 2. Severely dilated left ventricle with marked septal-lateral dyssynchrony as well as inferior severe hypokinesis and septal severe hypokinesis. Difficult to quantify EF due to dyssynchrony, but calculated at 18%. 3. Mildly dilated RV with severe systolic dysfunction, EF 17%. 4. Poor delayed enhancement images with significant artifact. Probably inferior RV insertion site LGE (nonspecific).  Will need cardiac PET at [facility] to better evaluate for cardiac sarcoid  He developed bronchospasm/hypotension with his bronchoscopy and remained intubated post procedure Started on solumedrol  and pressors EXTUBATED 11/23/20  Started on  Steroids per CCM.  - Bactrim for PJP prophylaxis. - Starting weekly MTX for steroid sparing   EP is asked to see the patient to consider CRT-D prior to  discharge with long hx of CM (back to 2018) and LBBB.  CAD is not felt to explain the degree of his CM.  He was on BB and ACE outpt, (perhaps though not compliant) HF notes mentions some PVCs, NSVT    LABS today Coox 68 K+ 3.6 BUN/Creat 20/0.79 WBC 9.5 H/H 18/58 Plts 206  The patient is feeling better.  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  2 days prior to coming in he started to feel SOB with minimal exertion and though he may have pneumonia or COVID, his legs slightly swollen but not clearly did he suspect fluid retention.  He reports known weak heart muscle going back years, started with a pneumonia, sarcoidosis is a new diagnosis for him. He has never fainted, denies near syncope. When he was taking his medicines years ago, he felt occasionally lightheaded that he attributed to the medicines and getting dehydrated in the kitchen  He is an ongoing smoker since his teens, he will have 1 drink after work, not every night.  He reports historu of drug use, none in years and does not offer specifics on this.     Past Medical History:  Diagnosis Date   Asthma    CHF (congestive heart failure) (HCC)    Diabetes mellitus without complication (HCC)    OSA on CPAP    Pneumonia 06/2016    Past Surgical History:  Procedure Laterality Date   BRONCHIAL BIOPSY  11/21/2020   Procedure: BRONCHIAL BIOPSIES;  Surgeon: Lorin Glass, MD;  Location: [facility] ENDOSCOPY;  Service: Pulmonary;;   BRONCHIAL NEEDLE ASPIRATION BIOPSY  11/21/2020   Procedure: BRONCHIAL NEEDLE ASPIRATION BIOPSIES;  Surgeon: Lorin Glass, MD;  Location: [facility] ENDOSCOPY;  Service: Pulmonary;;   I & D EXTREMITY     RIGHT/LEFT HEART  CATH AND CORONARY ANGIOGRAPHY N/A 11/20/2020   Procedure: RIGHT/LEFT HEART CATH AND CORONARY ANGIOGRAPHY;  Surgeon: Laurey Morale, MD;  Location: [facility] INVASIVE CV LAB;  Service: Cardiovascular;  Laterality: N/A;   SKIN GRAFT     VIDEO BRONCHOSCOPY WITH ENDOBRONCHIAL ULTRASOUND N/A 11/21/2020   Procedure: VIDEO BRONCHOSCOPY WITH ENDOBRONCHIAL ULTRASOUND;  Surgeon: Lorin Glass, MD;  Location: [facility] ENDOSCOPY;  Service: Pulmonary;  Laterality: N/A;     Home Medications:  Prior to Admission medications   Medication Sig Start Date End Date Taking? Authorizing Provider  cetirizine (ZYRTEC) 10 MG tablet Take 10 mg by mouth daily.   Yes [provider]  ibuprofen (ADVIL) 200 MG tablet Take 200 mg by mouth every 6 (six) hours as needed for moderate pain.   Yes [provider]  carvedilol (COREG) 12.5 MG tablet Take 1 tablet (12.5 mg total) by mouth 2 (two) times daily with a meal. Patient not taking: No sig reported 02/21/19   Clarisa Kindred A, FNP  fexofenadine (ALLEGRA) 180 MG tablet Take 1 tablet (180 mg total) by mouth daily. Taking as needed Patient not taking:  No sig reported 11/11/18   Alwyn Ren, MD  furosemide (LASIX) 80 MG tablet Take 1 tablet (80 mg total) by mouth 2 (two) times daily. Patient not taking: No sig reported 02/21/19   Clarisa Kindred A, FNP  HYDROcodone-acetaminophen (NORCO/VICODIN) 5-325 MG tablet Take 2 tablets by mouth every 6 (six) hours as needed. Patient not taking: No sig reported 06/22/20   Ward, Baxter Hire N, DO  lisinopril (ZESTRIL) 5 MG tablet Take 1 tablet (5 mg total) by mouth daily. Patient not taking: No sig reported 02/21/19   Clarisa Kindred A, FNP  metFORMIN (GLUCOPHAGE) 500 MG tablet Take 1 tablet (500 mg total) by mouth 2 (two) times daily with a meal. Patient not taking: No sig reported 03/21/19   Clarisa Kindred A, FNP  ondansetron (ZOFRAN ODT) 4 MG disintegrating tablet Take 1 tablet (4 mg total) by mouth every 8 (eight) hours as needed for  nausea or vomiting. Patient not taking: No sig reported 06/22/20   Ward, Layla Maw, DO    Inpatient Medications: Scheduled Meds:  aspirin EC  81 mg Oral Daily   carvedilol  3.125 mg Oral BID WC   Chlorhexidine Gluconate Cloth  6 each Topical Q0600   digoxin  0.125 mg Oral Daily   enoxaparin (LOVENOX) injection  80 mg Subcutaneous Q24H   fluticasone  2 spray Each Nare Daily   folic acid  1 mg Oral Daily   furosemide  40 mg Oral Daily   insulin aspart  0-20 Units Subcutaneous TID WC   insulin aspart  0-5 Units Subcutaneous QHS   insulin aspart  10 Units Subcutaneous TID WC   insulin glargine  45 Units Subcutaneous BID   mouth rinse  15 mL Mouth Rinse BID   [START ON 11/30/2020] methotrexate  20 mg Oral Weekly   predniSONE  60 mg Oral Q breakfast   rosuvastatin  20 mg Oral Daily   sacubitril-valsartan  1 tablet Oral BID   sodium chloride flush  10-40 mL Intracatheter Q12H   spironolactone  25 mg Oral Daily   sulfamethoxazole-trimethoprim  1 tablet Oral Once per day on Mon Wed Fri   Continuous Infusions:  sodium chloride 10 mL/hr at 11/24/20 1600   PRN Meds: HYDROcodone-acetaminophen, ipratropium-albuterol, nitroGLYCERIN, sodium chloride, sodium chloride flush, traZODone  Allergies:    Allergies  Allergen Reactions   Azithromycin Itching and Rash    Burning rash with intravenous dose    Social History:   Social History   Socioeconomic History   Marital status: Divorced    Spouse name: Not on file   Number of children: 1   Years of education: Not on file   Highest education level: Associate degree: occupational, Scientist, product/process development, or vocational program  Occupational History   Not on file  Tobacco Use   Smoking status: Current Every Day Smoker    Packs/day: 0.75    Types: Cigarettes    Start date: 11/23/2016   Smokeless tobacco: Never Used  Vaping Use   Vaping Use: Former   Start date: 11/23/2016  Substance and Sexual Activity   Alcohol use: Yes    Comment: occasional     Drug use: No   Sexual activity: Not Currently  Other Topics Concern   Not on file  Social History Narrative   Not on file   Social Determinants of Health   Financial Resource Strain: Medium Risk   Difficulty of Paying Living Expenses: Somewhat hard  Food Insecurity: No Food Insecurity   Worried About Running  Out of Food in the Last Year: Never true   Ran Out of Food in the Last Year: Never true  Transportation Needs: No Transportation Needs   Lack of Transportation (Medical): No   Lack of Transportation (Non-Medical): No  Physical Activity: Not on file  Stress: Not on file  Social Connections: Not on file  Intimate Partner Violence: Not on file    Family History:   Family History  Problem Relation Age of Onset   Congestive Heart Failure Mother    Hypertension Mother    Stroke Father        2017   Asthma Father      ROS:  Please see the history of present illness.  All other ROS reviewed and negative.     Physical Exam/Data:   Vitals:   11/25/20 2335 11/26/20 0215 11/26/20 0312 11/26/20 0753  BP: (!) 125/96  130/90 136/82  Pulse: 100  93 99  Resp: 19 (!) 23 18 19   Temp: 98.9 F (37.2 C)  98.2 F (36.8 C) 98 F (36.7 C)  TempSrc: Oral  Oral Oral  SpO2: 93%  94% 95%  Weight:  (!) 158.8 kg    Height:        Intake/Output Summary (Last 24 hours) at 11/26/2020 1056 Last data filed at 11/26/2020 1007 Gross per 24 hour  Intake 1060 ml  Output 1325 ml  Net -265 ml   Last 3 Weights 11/26/2020 11/25/2020 11/24/2020  Weight (lbs) 350 lb 1.6 oz 353 lb 6.4 oz 356 lb 0.7 oz  Weight (kg) 158.804 kg 160.3 kg 161.5 kg     Body mass index is 43.76 kg/m.  General:  Well nourished, well developed, in no acute distress, appears older then his age and chronically ill appearing HEENT: normal Lymph: no adenopathy Neck: difficult to assess well Endocrine:  No thryomegaly Vascular: No carotid bruits  Cardiac:  RRR; no murmurs, gallops or rubs Lungs:  CTA b/l , no wheezing,  rales   Abd: soft, nontender, morbidly obease Ext: no pitting edema Musculoskeletal:  No deformities, BUE and BLE strength normal and equal Skin: warm and dry  Neuro:  no focal abnormalities noted Psych:  Normal affect   EKG:  The EKG was personally reviewed and demonstrates:   Admitting: regular, likely ST 107bpm, LBBB QRS 183  5/28: SR 93bpm, 1st degree AVBlock 6/28, LBBB 6/1: SR 75bpm, 1st degree AVblock, LBBB 6/4: ST 105  OLD 11/07/2018: ST 107, normal intervals  Telemetry:  Telemetry was personally reviewed and demonstrates:   SR generally 90's, some PVCs/PACs  Relevant CV Studies:  2D Echo 11/16/20 1. Akinesis noted of the basal to mid septum and basal to mid inferior wall. There is also septal movement consistent with LBBB. Unusual pattern of akinesis for coronary distribution noted. Would consider infiltrative cardiomyopathy such as sarcoidosis and would recommend a cardiac MRI for clarification. Left ventricular ejection fraction, by estimation, is 20 to 25%. The left ventricle has severely decreased function. The left ventricle demonstrates regional wall motion abnormalities (see scoring diagram/findings for description). The left ventricular internal cavity size was moderately dilated. There is moderate concentric left ventricular hypertrophy. Indeterminate diastolic filling due to E-A fusion. 2. Right ventricular systolic function is mildly reduced. The right ventricular size is mildly enlarged. There is mildly elevated pulmonary artery systolic pressure. The estimated right ventricular systolic pressure is 42.9 mmHg. 3. The mitral valve is grossly normal. Trivial mitral valve regurgitation. No evidence of mitral stenosis. 4. The aortic valve  is tricuspid. Aortic valve regurgitation is not visualized. No aortic stenosis is present. 5. The inferior vena cava is dilated in size with <50% respiratory variability, suggesting right atrial pressure of 15 mmHg.        R/LHC 11/20/20   Prox RCA lesion is 100% stenosed. Prox LAD lesion is 50% stenosed.   1. Normal filling pressures.  2. Low cardiac output.  Mildly decreased by thermodilution which is likely more accurate as oxygen saturation was up and down during case so unsure of Fick accuracy.  3. Totally occluded RCA is likely chronic.  There are R=>R collaterals, not L=>R collaterals.  50% stenosis proximal LAD.    Suspect mixed ischemic/nonischemic cardiomyopathy.    Right Heart Pressures RHC Procedural Findings: Hemodynamics (mmHg) RA mean 2 RV 31/3 PA 30/15, mean 21 PCWP mean 8 LV 101/15 AO 100/71  Oxygen saturations: PA 60% AO 93%  Cardiac Output (Fick) 4.6  Cardiac Index (Fick) 1.64   Cardiac Output (Thermo) 5.98  Cardiac Index (Thermo) 2.13     Laboratory Data:  High Sensitivity Troponin:   Recent Labs  Lab 11/17/20 1615 11/17/20 1736 11/22/20 0004 11/22/20 0215 11/24/20 0230  TROPONINIHS 24* 29* 24* 24* 32*     Chemistry Recent Labs  Lab 11/24/20 0230 11/25/20 0314 11/26/20 0430  NA 135 135 137  K 4.0 3.7 3.6  CL 99 99 99  CO2 28 30 33*  GLUCOSE 162* 198* 174*  BUN 28* 21* 20  CREATININE 0.64 0.62 0.79  CALCIUM 8.8* 8.6* 8.9  GFRNONAA >60 >60 >60  ANIONGAP 8 6 5     Recent Labs  Lab 11/24/20 0230 11/25/20 0314 11/26/20 0430  PROT 6.3* 6.5 6.6  ALBUMIN 3.3* 3.2* 3.2*  AST 18 23 25   ALT 26 32 42  ALKPHOS 85 78 85  BILITOT 0.8 0.4 0.8   Hematology Recent Labs  Lab 11/24/20 0230 11/25/20 0314 11/26/20 0430  WBC 13.7* 11.7* 9.5  RBC 6.21* 6.42* 6.79*  HGB 17.6* 17.8* 18.8*  HCT 53.6* 55.0* 58.5*  MCV 86.3 85.7 86.2  MCH 28.3 27.7 27.7  MCHC 32.8 32.4 32.1  RDW 14.2 13.7 14.2  PLT 198 204 206   BNPNo results for input(s): BNP, PROBNP in the last 168 hours.  DDimer No results for input(s): DDIMER in the last 168 hours.   Radiology/Studies:  No results found.   Assessment and Plan:   1. Cardiomyopathy     Mixed ICM/NICM     LVEF  20-25% back to 2018     18% by MRI 2. Acute on chronic CHF (systolic)     HF team and pulmonary service are managing  Off all meds for the last 2 years This admission found with likely pulmonary and cardiac sarcoid Started on steroid, bactrim and methotrexate  LBBB like EKG with wide QRS 170's No hx of syncope, near syncope  Indication for ICD, will discuss with dr. 01/25/21, he will see the patient later today for further discussion and recommendations   Risk Assessment/Risk Scores:  { For questions or updates, please contact [facility] Please consult [email or web address] for contact info under    Signed, 01/26/21, PA-C  11/26/2020 10:56 AM  EP Attending  Patient seen and examined. The patient is a pleasant morbidly obese middle aged man with non-compliance who was admitted with worsening dyspnea, thought multifiactorial. He has known sarcoid which is thought to involve the heart though not for certain. He has not had syncope. He has a h/o non-compliance.  He has had severe LV dysfunction for over 3 years. He has a h/o EToH abuse. He has had a heart cath demonstrating a chronically occluded RCA with R to R collaterals. He has biventricular dysfunction. The patient has never had syncope or a cardiac arrest. He has LBBB and first degree AV block. He has no h/o VT. He denies anginal symptoms. His exam is notable for a chronically ill appearing middle aged man in no distress. Lungs reveal basilar rales and CV reveal RRR with a split S2. He has trace edema and his neuro exam is non-focal.  A/P Chronic systolic heart failure - he has severe LV dysfunction and evidence of marked dysynchrony by echo. I discussed the treatment options. He will need to be on at least 3 months of GDMT and a repeat echo. If his EF remains down despite GDMT, then ICD would be recommended. CAD - His LV dysfunction is out of proportion to his severity of the disease. He does not have angina. Morbid obesity - this  is clearly an issue and he desperately needs to lose weight.  Sarcoid - note plans for a PET scan in the future. Whether he has active sarcoid or not is unclear. Is on treatment currently for pulmonary sarcoid. Sharlot Gowda Marvyn Torrez,MD

## 2020-11-26 NOTE — Progress Notes (Signed)
Physical Therapy Treatment Patient Details Name: Marc Wright MRN: [id number] DOB: [birth year] Today's Date: 11/26/2020    History of Present Illness Patient is a 45 y/o male who presents on 11/15/20 with SOB, cough, fever and, body aches. Chest CT- multifocal consolidation with mediastinal lymphadenopathy and some infiltrates concerning for sarcoid. Admitted with acute on chronic CHF, respiratory failure, HTN, sarcoidosis with cardiac component.  Pt s/p L heart cath on 11/20/20 and bronchoscopy 11/21/20 and remained intubated post procedure.  Extubated 11/23/20. PMH includes HFrEF 25-30%, HTN, morbid obesity, OSA on CPAP, DM2.    PT Comments    Continuing work on functional mobility and activity tolerance; Participating well, and seemed pleased to be able to do a little bit of walking; Obtained Orthostatic BPs (see below, or See vitals flow sheet.), which showed an initial substantial BP drop in standing, but recovered to original sitting BP at 3 minutes standing   Follow Up Recommendations  Home health PT;Supervision for mobility/OOB     Equipment Recommendations  Rolling walker with 5" wheels;3in1 (PT)    Recommendations for Other Services OT consult     Precautions / Restrictions Precautions Precautions: Fall Precaution Comments: watch 02, HR, and orthostatic BPS    Mobility  Bed Mobility Overal bed mobility: Modified Independent             General bed mobility comments: No assist needed, no use of rails, HOB elevated    Transfers Overall transfer level: Needs assistance Equipment used: None Transfers: Sit to/from Stand Sit to Stand: Min assist         General transfer comment: Heavily dependent on forward trunk and hip flexion to clear hips, then pushes to upright standing with UE push assist; used bedrail to push up  Ambulation/Gait Ambulation/Gait assistance: Min guard (with and without physical contact) Gait Distance (Feet): 15 Feet Assistive device: None;1  person hand held assist (steadying self on foot board and wall) Gait Pattern/deviations: Step-through pattern;Decreased stride length;Wide base of support     General Gait Details: Cues to self-monitor for activity tolerance; Reports unsteadiness on L foot in stance   Stairs             Wheelchair Mobility    Modified Rankin (Stroke Patients Only)       Balance Overall balance assessment: Needs assistance   Sitting balance-Leahy Scale: Good       Standing balance-Leahy Scale: Poor                              Cognition Arousal/Alertness: Awake/alert Behavior During Therapy: WFL for tasks assessed/performed Overall Cognitive Status: Within Functional Limits for tasks assessed                                        Exercises      General Comments General comments (skin integrity, edema, etc.):   11/26/20 1630  Vital Signs  Patient Position (if appropriate) Orthostatic Vitals  Orthostatic Lying   BP- Lying 119/80  Pulse- Lying 94  Orthostatic Sitting  BP- Sitting 107/79  Pulse- Sitting 100  Orthostatic Standing at 0 minutes  BP- Standing at 0 minutes (!) 88/56 (MAP 68)  Pulse- Standing at 0 minutes 115  Orthostatic Standing at 3 minutes  BP- Standing at 3 minutes (!) 103/93 (MAP 98)  Pulse- Standing at 3 minutes 111 (after walking  around bed to recliner)  , session conducted on room air and O2 sats remained greater than or equal to 90%      Pertinent Vitals/Pain Pain Assessment: No/denies pain    Home Living                      Prior Function            PT Goals (current goals can now be found in the care plan section) Acute Rehab PT Goals Patient Stated Goal: to walk without the walker, regain his strength PT Goal Formulation: With patient Time For Goal Achievement: 12/08/20 Potential to Achieve Goals: Good Progress towards PT goals: Progressing toward goals    Frequency    Min 3X/week       PT Plan Current plan remains appropriate    Co-evaluation              AM-PAC PT "6 Clicks" Mobility   Outcome Measure  Help needed turning from your back to your side while in a flat bed without using bedrails?: A Little Help needed moving from lying on your back to sitting on the side of a flat bed without using bedrails?: A Little Help needed moving to and from a bed to a chair (including a wheelchair)?: A Little Help needed standing up from a chair using your arms (e.g., wheelchair or bedside chair)?: A Little Help needed to walk in hospital room?: Total Help needed climbing 3-5 steps with a railing? : Total 6 Click Score: 14    End of Session Equipment Utilized During Treatment: Gait belt Activity Tolerance: Patient limited by fatigue Patient left: in chair;with call bell/phone within reach Nurse Communication: Mobility status PT Visit Diagnosis: Muscle weakness (generalized) (M62.81);Difficulty in walking, not elsewhere classified (R26.2)     Time: 4431-5400 PT Time Calculation (min) (ACUTE ONLY): 31 min  Charges:  $Gait Training: 8-22 mins $Therapeutic Activity: 8-22 mins                     Van Clines, PT  Acute Rehabilitation Services Pager [phone] Office [phone]    Levi Aland 11/26/2020, 8:00 PM

## 2020-11-26 NOTE — Progress Notes (Signed)
Nutrition Follow-up  DOCUMENTATION CODES:   Morbid obesity  INTERVENTION:   -Double protein portions with meals -Magic cup TID with meals, each supplement provides 290 kcal and 9 grams of protein -MVI with minerals daily  NUTRITION DIAGNOSIS:   Inadequate oral intake related to inability to eat as evidenced by NPO status.  Progressing; advanced to PO diet on 11/23/20  GOAL:   Patient will meet greater than or equal to 90% of their needs  Progressing   MONITOR:   PO intake,Supplement acceptance,Labs,Weight trends,Skin,I & O's  REASON FOR ASSESSMENT:   Ventilator,Consult Enteral/tube feeding initiation and management  ASSESSMENT:   45 yo male admitted with acute on chronic CHF. PMH includes asthma, CHF, HTN, OSA, DM.  5/31- s/p heart cath and coronary angiography  6/1- s/p bronchoscopy  6/3- extubated, s/p BSE- advanced to regular diet with thin liquids  Reviewed I/O's: -1.7 L x 24 hours and -22.3 L since admission  UOP: 3.1 L x 24 hours  Pt receiving nursing care at time of visit.   Pt with good appetite. Noted meal completion 100%.   Po 100%  Medications reviewed and include folic acid, lasix, prednisone, and aldactone.   Labs reviewed: CBGS: 123-345 (inpatient orders for glycemic control are 0-20 units insulin aspart TID with meals, 0-5 units insulin aspart daily at bedtime, 10 units insulin aspart TID with meals, and 45 units insulin glargine BID).   Diet Order:   Diet Order            Diet regular Room service appropriate? Yes; Fluid consistency: Thin  Diet effective now                 EDUCATION NEEDS:   Not appropriate for education at this time  Skin:  Skin Assessment: Reviewed RN Assessment  Last BM:  11/24/20  Height:   Ht Readings from Last 1 Encounters:  11/16/20 6\' 3"  (1.905 m)    Weight:   Wt Readings from Last 1 Encounters:  11/26/20 (!) 158.8 kg    Ideal Body Weight:  89.1 kg  BMI:  Body mass index is 43.76  kg/m.  Estimated Nutritional Needs:   Kcal:  01/26/21  Protein:  135-150 grams  Fluid:  > 2 L    [id number], RD, LDN, CDCES Registered Dietitian II Certified Diabetes Care and Education Specialist Please refer to [facility] for RD and/or RD on-call/weekend/after hours pager

## 2020-11-26 NOTE — Discharge Instructions (Signed)
You were seen by the pulmonary (lung) team to help with what is going on. Based on the biopsy, the shadows in your lungs are related to sarcoidosis. Continue prednisone 60 mg daily and Bactrim 1 tab every Monday Wednesday and Friday. We will go down on prednisone in the future. To help with this we started methotrexate. Take methotrexate 20 mg once weekly (Friday). We will check labs to make sure there is no toxicity in a few weeks. Dr. Judeth Horn will see you in clinic in early July to discuss next steps. Someone will call to confirm. Hopefully, the cardiac PET scan will be performed to help know the best role for prednisone moving forward.  Please call Dr. Laurena Spies office [facility] at [phone] with any questions or concerns.

## 2020-11-26 NOTE — Progress Notes (Signed)
CARDIAC REHAB PHASE I   HF education completed with pt. Pt given HF booklet. Reviewed importance of daily weights along with monitoring fluid and salt intake. Pt states he does NOT have a scale. Provided support and encouragement. Will continue to follow.  [id number] Reynold Bowen, RN BSN 11/26/2020 2:36 PM

## 2020-11-26 NOTE — Progress Notes (Addendum)
Patient ID: Marc Wright, male   DOB: [birth year], 45 y.o.   MRN: [id number]     Advanced Heart Failure Rounding Note  PCP-Cardiologist: None   Subjective:    Developed bronchospasm/hypotension with bronchoscopy/biopsy on 6/1, intubated. Started on Solumedrol.  Extubated 6/3, off pressors.    Feels ok today. No complaints. Denies dyspnea. Good diuresis again yesterday. Wt down another 3 lb. Scr stable. CVP 5   Cardiac MRI: 1. Bilateral airspace disease, see dedicated chest CT from earlier this admission. 2. Severely dilated left ventricle with marked septal-lateral dyssynchrony as well as inferior severe hypokinesis and septal severe hypokinesis. Difficult to quantify EF due to dyssynchrony, but calculated at 18%. 3. Mildly dilated RV with severe systolic dysfunction, EF 17%. 4. Poor delayed enhancement images with significant artifact. Probably inferior RV insertion site LGE (nonspecific).  RHC/LHC: Coronary Findings   Diagnostic Dominance: Right  Left Anterior Descending  Prox LAD lesion is 50% stenosed.  Right Coronary Artery  Prox RCA lesion is 100% stenosed. There are weak right-right collaterals, no left-right collaterals.   Intervention   No interventions have been documented.  Right Heart  Right Heart Pressures RHC Procedural Findings: Hemodynamics (mmHg) RA mean 2 RV 31/3 PA 30/15, mean 21 PCWP mean 8 LV 101/15 AO 100/71  Oxygen saturations: PA 60% AO 93%  Cardiac Output (Fick) 4.6  Cardiac Index (Fick) 1.64   Cardiac Output (Thermo) 5.98  Cardiac Index (Thermo) 2.13      Objective:   Weight Range: (!) 158.8 kg Body mass index is 43.76 kg/m.   Vital Signs:   Temp:  [96.4 F (35.8 C)-98.9 F (37.2 C)] 98.2 F (36.8 C) (06/06 0312) Pulse Rate:  [70-100] 93 (06/06 0312) Resp:  [13-26] 18 (06/06 0312) BP: (106-160)/(68-96) 130/90 (06/06 0312) SpO2:  [91 %-99 %] 94 % (06/06 0312) Weight:  [158.8 kg] 158.8 kg (06/06 0215) Last BM Date:  11/22/20  Weight change: Filed Weights   11/24/20 0400 11/25/20 0449 11/26/20 0215  Weight: (!) 161.5 kg (!) 160.3 kg (!) 158.8 kg    Intake/Output:   Intake/Output Summary (Last 24 hours) at 11/26/2020 0723 Last data filed at 11/26/2020 0314 Gross per 24 hour  Intake 1390 ml  Output 3075 ml  Net -1685 ml      Physical Exam   CVP 5  General:  Well appearing obese. No respiratory difficulty HEENT: normal Neck: supple. JVD not elevated. Carotids 2+ bilat; no bruits. No lymphadenopathy or thyromegaly appreciated. Cor: PMI nondisplaced. Regular rate & rhythm. No rubs, gallops or murmurs. Lungs: clear Abdomen: obese, soft, nontender, nondistended. No hepatosplenomegaly. No bruits or masses. Good bowel sounds. Extremities: no cyanosis, clubbing, rash, edema + RUE PICC  Neuro: alert & oriented x 3, cranial nerves grossly intact. moves all 4 extremities w/o difficulty. Affect pleasant.    Telemetry   SR with 1st degree block (personally reviewed).    Labs    CBC Recent Labs    11/25/20 0314 11/26/20 0430  WBC 11.7* 9.5  NEUTROABS 8.8* 5.8  HGB 17.8* 18.8*  HCT 55.0* 58.5*  MCV 85.7 86.2  PLT 204 206   Basic Metabolic Panel Recent Labs    38/75/64 0912 11/25/20 0314 11/26/20 0430  NA  --  135 137  K  --  3.7 3.6  CL  --  99 99  CO2  --  30 33*  GLUCOSE  --  198* 174*  BUN  --  21* 20  CREATININE  --  0.62 0.79  CALCIUM  --  8.6* 8.9  MG 1.9  --   --    Liver Function Tests Recent Labs    11/25/20 0314 11/26/20 0430  AST 23 25  ALT 32 42  ALKPHOS 78 85  BILITOT 0.4 0.8  PROT 6.5 6.6  ALBUMIN 3.2* 3.2*   No results for input(s): LIPASE, AMYLASE in the last 72 hours. Cardiac Enzymes No results for input(s): CKTOTAL, CKMB, CKMBINDEX, TROPONINI in the last 72 hours.  BNP: BNP (last 3 results) Recent Labs    11/15/20 1556  BNP 598.1*    ProBNP (last 3 results) No results for input(s): PROBNP in the last 8760 hours.   D-Dimer No results for  input(s): DDIMER in the last 72 hours. Hemoglobin A1C No results for input(s): HGBA1C in the last 72 hours. Fasting Lipid Panel Recent Labs    11/25/20 0314  TRIG 130   Thyroid Function Tests No results for input(s): TSH, T4TOTAL, T3FREE, THYROIDAB in the last 72 hours.  Invalid input(s): FREET3  Other results:   Imaging    No results found.   Medications:     Scheduled Medications: . aspirin EC  81 mg Oral Daily  . carvedilol  3.125 mg Oral BID WC  . Chlorhexidine Gluconate Cloth  6 each Topical Q0600  . digoxin  0.125 mg Oral Daily  . enoxaparin (LOVENOX) injection  80 mg Subcutaneous Q24H  . fluticasone  2 spray Each Nare Daily  . folic acid  1 mg Oral Daily  . furosemide  40 mg Oral Daily  . insulin aspart  0-20 Units Subcutaneous TID WC  . insulin aspart  0-5 Units Subcutaneous QHS  . insulin aspart  10 Units Subcutaneous TID WC  . insulin glargine  45 Units Subcutaneous BID  . mouth rinse  15 mL Mouth Rinse BID  . methotrexate  15 mg Oral Weekly  . predniSONE  60 mg Oral Q breakfast  . rosuvastatin  20 mg Oral Daily  . sacubitril-valsartan  1 tablet Oral BID  . sodium chloride flush  10-40 mL Intracatheter Q12H  . spironolactone  25 mg Oral Daily  . sulfamethoxazole-trimethoprim  1 tablet Oral Once per day on Mon Wed Fri    Infusions: . sodium chloride 10 mL/hr at 11/24/20 1600    PRN Medications: HYDROcodone-acetaminophen, ipratropium-albuterol, nitroGLYCERIN, sodium chloride, sodium chloride flush, traZODone   Assessment/Plan   1. 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 He has a wide LBBB. Echo this admission with basal-mid inferior akinesis, dyssynchrony from LBBB, EF 20-25%, moderate LVH, mildly decreased RV function. RHC/LHC with normal filling pressures, marginal CI (2.1 by thermodilution is likely more accurate than Fick); cath showed 50% proximal LAD and occluded RCA (chronic).  Extent of cardiomyopathy is not  explained by coronary disease, suspect there is a nonischemic component likely from cardiac sarcoidosis. Cardiac MRI showed only nonspecific RV insertion site LGE (findings were not highly suggestive of cardiac sarcoidosis), but study was technically difficult.  Volume status good. CVP 5  - Continue low dose Coreg 3.125 mg bid (watch with 1st degree AVB).   - Start Lasix 40 mg po daily   - Continue digoxin 0.125, follow closely with 1st degree AVB.  - Continue Entresto 24/26 bid.   - Continue spironolactone 25 mg daily.  - Will not add SGLT2 inhibitor yet with uncontrolled diabetes.  - Needs EP evaluation eventually for CRT-D placement (has had low EF since 2018, has wide LBBB, high risk for  arrhythmias with suspected cardiac sarcoidosis).  Would like to have this done prior to discharge => will ask EP to see today.  - Will eventually need cardiac PET for assessment of cardiac involvement by sarcoidosis (will need to be done at [facility] as outpatient).  2. CAD: Presentation with CHF, not ACS.  LHC with 50% pLAD, totally occluded proximal RCA with R=>R collaterals (old occlusion).  Reviewed with interventional cardiology, would hold off on PCI attempt at this time as CTO.  - ASA 81 daily - Crestor 20 mg daily.  3. OHS/OSA: Needs CPAP, on oxygen during the day. 4. Sarcoidosis: High resolution CT chest showed dense bilateral consolidations and enlarged hilar/mediastinal nodes.  Does not appear infectious.  Sarcoid is likely unifying diagnosis for heart and lungs. ACE level elevated.  He has had bronch/biopsy, path suggestive of sarcoidosis.  Intubated post-bronch with bronchospasm/hypotension and now extubated.  - Starting prednisone 60 mg daily, slow taper over months.  - Bactrim for PJP prophylaxis. - Starting weekly MTX for steroid sparing, continue long-term.  5. Smoking: Needs to quit.   6. Type 2 diabetes: Per primary service.  7. NSVT/PVCs: Stable.  - Continue Coreg. - Will eventually need ICD  as above.   Length of Stay: [address] PA-C  11/26/2020, 7:23 AM  Advanced Heart Failure Team Pager [phone] (M-F; 7a - 5p)  Please contact [facility] for night-coverage after hours (5p -7a ) and weekends on [email or web address]  Patient seen with PA, agree with the above note.    CVP 5 today, no dyspnea.  Has not been out of bed much.   General: NAD Neck: Thick. No JVD, no thyromegaly or thyroid nodule.  Lungs: Clear to auscultation bilaterally with normal respiratory effort. CV: Nondisplaced PMI.  Heart regular S1/S2, no S3/S4, no murmur.  No peripheral edema.   Abdomen: Soft, nontender, no hepatosplenomegaly, no distention.  Skin: Intact without lesions or rashes.  Neurologic: Alert and oriented x 3.  Psych: Normal affect. Extremities: No clubbing or cyanosis.  HEENT: Normal.   Euvolemic now, can start Lasix 40 mg po daily.   Continue prednisone/MTX regimen for sarcoidosis (suspect cardiac sarcoidosis in addition to pulmonary sarcoidosis).   Wide LBBB with prominent dyssynchrony, 1st degree AVB, cardiac sarcoidosis with low EF since 2018 => favor implantation of CRT-D at this point.  Will have EP see him today.   Marca Ancona 11/26/2020 8:22 AM

## 2020-11-26 NOTE — Progress Notes (Signed)
MD notes state CPAP QHS-nasal mask. Pt stated he cannot wear the nasal mask due to dry sinuses. Will see if order can be changed to [facility] inoder for pt to wear CPAP.

## 2020-11-27 LAB — BASIC METABOLIC PANEL
Anion gap: 4 — ABNORMAL LOW (ref 5–15)
BUN: 18 mg/dL (ref 6–20)
CO2: 33 mmol/L — ABNORMAL HIGH (ref 22–32)
Calcium: 8.8 mg/dL — ABNORMAL LOW (ref 8.9–10.3)
Chloride: 98 mmol/L (ref 98–111)
Creatinine, Ser: 0.62 mg/dL (ref 0.61–1.24)
GFR, Estimated: >60 mL/min (ref >60)
Glucose, Bld: 167 mg/dL — ABNORMAL HIGH (ref 70–99)
Potassium: 3.6 mmol/L (ref 3.5–5.1)
Sodium: 135 mmol/L (ref 135–145)

## 2020-11-27 LAB — GLUCOSE, CAPILLARY
Glucose-Capillary: 171 mg/dL — ABNORMAL HIGH (ref 70–99)
Glucose-Capillary: 219 mg/dL — ABNORMAL HIGH (ref 70–99)
Glucose-Capillary: 228 mg/dL — ABNORMAL HIGH (ref 70–99)
Glucose-Capillary: 389 mg/dL — ABNORMAL HIGH (ref 70–99)
Glucose-Capillary: 294 mg/dL — ABNORMAL HIGH (ref 70–99)

## 2020-11-27 LAB — COOXEMETRY PANEL
Carboxyhemoglobin: 1.1 % (ref 0.5–1.5)
Methemoglobin: 0.8 % (ref 0.0–1.5)
O2 Saturation: 63.9 %
Total hemoglobin: 18.6 g/dL — ABNORMAL HIGH (ref 12.0–16.0)

## 2020-11-27 LAB — CBC
HCT: 56.3 % — ABNORMAL HIGH (ref 39.0–52.0)
Hemoglobin: 18 g/dL — ABNORMAL HIGH (ref 13.0–17.0)
MCH: 27.4 pg (ref 26.0–34.0)
MCHC: 32 g/dL (ref 30.0–36.0)
MCV: 85.6 fL (ref 80.0–100.0)
Platelets: 198 10*3/uL (ref 150–400)
RBC: 6.58 MIL/uL — ABNORMAL HIGH (ref 4.22–5.81)
RDW: 13.8 % (ref 11.5–15.5)
WBC: 10.2 10*3/uL (ref 4.0–10.5)
nRBC: 0 % (ref 0.0–0.2)

## 2020-11-27 LAB — LIPID PANEL
Cholesterol: 135 mg/dL (ref 0–200)
HDL: 34 mg/dL — ABNORMAL LOW (ref >40)
LDL Cholesterol: 76 mg/dL (ref 0–99)
Total CHOL/HDL Ratio: 4 RATIO
Triglycerides: 126 mg/dL (ref <150)
VLDL: 25 mg/dL (ref 0–40)

## 2020-11-27 MED ORDER — INSULIN GLARGINE 100 UNIT/ML ~~LOC~~ SOLN
48.0000 [IU] | Freq: Two times a day (BID) | SUBCUTANEOUS | Status: DC
  Administered 2020-11-27 – 2020-11-28 (×2): 48 [IU] via SUBCUTANEOUS
  Filled 2020-11-27 (×3): qty 0.48

## 2020-11-27 MED ORDER — NICOTINE 21 MG/24HR TD PT24
21.0000 mg | MEDICATED_PATCH | Freq: Every day | TRANSDERMAL | Status: DC
  Administered 2020-11-27 – 2020-11-28 (×2): 21 mg via TRANSDERMAL
  Filled 2020-11-27 (×2): qty 1

## 2020-11-27 MED ORDER — CHLORHEXIDINE GLUCONATE CLOTH 2 % EX PADS
6.0000 | MEDICATED_PAD | Freq: Every day | CUTANEOUS | Status: DC
  Administered 2020-11-28: 6 via TOPICAL

## 2020-11-27 NOTE — Progress Notes (Signed)
CARDIAC REHAB PHASE I   Pt denies questions or concerns regarding yesterdays education. Talked to pt some about LifeVest, pts major concerns is the amount he sweats. Pt also concerned with when he can return to work.   [id number] Reynold Bowen, RN BSN 11/27/2020 3:06 PM

## 2020-11-27 NOTE — Progress Notes (Signed)
   Asked by Dr Shirlee Latch to discuss possible Life Vest at d/c   We discussed Life Vest and showed him the image. Discussed purpose and the need to wear at all times except when showering.   He is not sure he is interested in [employer] but will consider.   EP recommending 3 months GDMT and repeat ECHO prior to ICD.   Nahia Nissan NP-C  1:45 PM

## 2020-11-27 NOTE — TOC Transition Note (Addendum)
Transition of Care [facility]) - CM/SW Discharge Note Heart Failure   Patient Details  Name: ZYIERE ROSEMOND MRN: [id number] Date of Birth: 05/20/1976  [facility] CM/SW Contact:  Pradyun Ishman, LCSWA Phone Number: 11/27/2020, 10:14 AM   Clinical Narrative:    CSW spoke with the patient at bedside to bring them an appointment card for the [facility] outpatient clinic and encouraged them to follow up and to attend the appointment and bring their medications and if anything changes to please reach out so that CSW/HV clinic team can provide support. CSW provided the patient with a reminder of his scheduled hospital follow up/new patient appointment with a primary care doctor at [facility] for 12/27/20 at 9:30am and provided the patient with that information as well.  CSW will sign off for now as social work intervention is no longer needed. Please consult [place] again if new needs arise.  Final next level of care: Home/Self Care Barriers to Discharge: No Barriers Identified   Patient Goals and CMS Choice        Discharge Placement                       Discharge Plan and Services In-house Referral: Clinical Social Work                                   Social Determinants of Health (SDOH) Interventions Food Insecurity Interventions: Intervention Not Indicated Financial Strain Interventions: Chiropodist (Comment) (Provided patient with a CAFA application referral to [facility] for PCP and HV outpatient clinic for medication assistance) Housing Interventions: Intervention Not Indicated Transportation Interventions: Intervention Not Indicated   Readmission Risk Interventions No flowsheet data found.  Zenda Herskowitz, MSW, LCSWA [phone] Heart Failure Social Worker

## 2020-11-27 NOTE — Progress Notes (Addendum)
Patient ID: Marc Wright, male   DOB: April 08, 1976, 45 y.o.   MRN: [id number]     Advanced Heart Failure Rounding Note  PCP-Cardiologist: None   Subjective:    Developed bronchospasm/hypotension with bronchoscopy/biopsy on 6/1, intubated. Started on Solumedrol.  Extubated 6/3, off pressors.   Feels ok this morning. No dyspnea. CVP not reading correctly. RN working to troubleshoot. Wt charted as up 6 lb. ? Accurate. Looks euvolemic on exam.    EP has seen. No plans for ICD this admit. Will re-evaluate after 3 months of GDMT.    Cardiac MRI: 1. Bilateral airspace disease, see dedicated chest CT from earlier this admission. 2. Severely dilated left ventricle with marked septal-lateral dyssynchrony as well as inferior severe hypokinesis and septal severe hypokinesis. Difficult to quantify EF due to dyssynchrony, but calculated at 18%. 3. Mildly dilated RV with severe systolic dysfunction, EF 17%. 4. Poor delayed enhancement images with significant artifact. Probably inferior RV insertion site LGE (nonspecific).  RHC/LHC: Coronary Findings   Diagnostic Dominance: Right  Left Anterior Descending  Prox LAD lesion is 50% stenosed.  Right Coronary Artery  Prox RCA lesion is 100% stenosed. There are weak right-right collaterals, no left-right collaterals.   Intervention   No interventions have been documented.  Right Heart  Right Heart Pressures RHC Procedural Findings: Hemodynamics (mmHg) RA mean 2 RV 31/3 PA 30/15, mean 21 PCWP mean 8 LV 101/15 AO 100/71  Oxygen saturations: PA 60% AO 93%  Cardiac Output (Fick) 4.6  Cardiac Index (Fick) 1.64   Cardiac Output (Thermo) 5.98  Cardiac Index (Thermo) 2.13      Objective:   Weight Range: (!) 161.6 kg Body mass index is 44.53 kg/m.   Vital Signs:   Temp:  [97.4 F (36.3 C)-98.5 F (36.9 C)] 97.8 F (36.6 C) (06/07 0513) Pulse Rate:  [84-99] 84 (06/07 0018) Resp:  [18-20] 18 (06/07 0513) BP:  (107-157)/(76-94) 157/87 (06/07 0018) SpO2:  [92 %-96 %] 96 % (06/07 0018) Weight:  [161.6 kg] 161.6 kg (06/07 0536) Last BM Date: 11/24/20  Weight change: Filed Weights   11/26/20 0215 11/27/20 0018 11/27/20 0536  Weight: (!) 158.8 kg (!) 161.6 kg (!) 161.6 kg    Intake/Output:   Intake/Output Summary (Last 24 hours) at 11/27/2020 0709 Last data filed at 11/27/2020 0522 Gross per 24 hour  Intake 920 ml  Output 2290 ml  Net -1370 ml      Physical Exam   PHYSICAL EXAM: General:  Well appearing, obese WM. No respiratory difficulty HEENT: normal Neck: supple. no JVD. Carotids 2+ bilat; no bruits. No lymphadenopathy or thyromegaly appreciated. Cor: PMI nondisplaced. Regular rate & rhythm. No rubs, gallops or murmurs. Lungs: clear Abdomen: soft, nontender, nondistended. No hepatosplenomegaly. No bruits or masses. Good bowel sounds. Extremities: no cyanosis, clubbing, rash, edema Neuro: alert & oriented x 3, cranial nerves grossly intact. moves all 4 extremities w/o difficulty. Affect pleasant.   Telemetry   SR with 1st degree block (personally reviewed).    Labs    CBC Recent Labs    11/25/20 0314 11/26/20 0430 11/27/20 0420  WBC 11.7* 9.5 10.2  NEUTROABS 8.8* 5.8  --   HGB 17.8* 18.8* 18.0*  HCT 55.0* 58.5* 56.3*  MCV 85.7 86.2 85.6  PLT 204 206 198   Basic Metabolic Panel Recent Labs    44/51/46 0912 11/25/20 0314 11/26/20 0430 11/27/20 0420  NA  --    < > 137 135  K  --    < >  3.6 3.6  CL  --    < > 99 98  CO2  --    < > 33* 33*  GLUCOSE  --    < > 174* 167*  BUN  --    < > 20 18  CREATININE  --    < > 0.79 0.62  CALCIUM  --    < > 8.9 8.8*  MG 1.9  --   --   --    < > = values in this interval not displayed.   Liver Function Tests Recent Labs    11/25/20 0314 11/26/20 0430  AST 23 25  ALT 32 42  ALKPHOS 78 85  BILITOT 0.4 0.8  PROT 6.5 6.6  ALBUMIN 3.2* 3.2*   No results for input(s): LIPASE, AMYLASE in the last 72 hours. Cardiac  Enzymes No results for input(s): CKTOTAL, CKMB, CKMBINDEX, TROPONINI in the last 72 hours.  BNP: BNP (last 3 results) Recent Labs    11/15/20 1556  BNP 598.1*    ProBNP (last 3 results) No results for input(s): PROBNP in the last 8760 hours.   D-Dimer No results for input(s): DDIMER in the last 72 hours. Hemoglobin A1C No results for input(s): HGBA1C in the last 72 hours. Fasting Lipid Panel Recent Labs    11/27/20 0437  CHOL 135  HDL 34*  LDLCALC 76  TRIG 299  CHOLHDL 4.0   Thyroid Function Tests No results for input(s): TSH, T4TOTAL, T3FREE, THYROIDAB in the last 72 hours.  Invalid input(s): FREET3  Other results:   Imaging    No results found.   Medications:     Scheduled Medications: . aspirin EC  81 mg Oral Daily  . carvedilol  3.125 mg Oral BID WC  . Chlorhexidine Gluconate Cloth  6 each Topical Tomorrow-1000  . digoxin  0.125 mg Oral Daily  . enoxaparin (LOVENOX) injection  80 mg Subcutaneous Q24H  . fluticasone  2 spray Each Nare Daily  . folic acid  1 mg Oral Daily  . furosemide  40 mg Oral Daily  . insulin aspart  0-20 Units Subcutaneous TID WC  . insulin aspart  0-5 Units Subcutaneous QHS  . insulin aspart  10 Units Subcutaneous TID WC  . insulin glargine  45 Units Subcutaneous BID  . mouth rinse  15 mL Mouth Rinse BID  . [START ON 11/30/2020] methotrexate  20 mg Oral Weekly  . multivitamin with minerals  1 tablet Oral Daily  . nicotine  21 mg Transdermal Daily  . predniSONE  60 mg Oral Q breakfast  . rosuvastatin  20 mg Oral Daily  . sacubitril-valsartan  1 tablet Oral BID  . sodium chloride flush  10-40 mL Intracatheter Q12H  . spironolactone  25 mg Oral Daily  . sulfamethoxazole-trimethoprim  1 tablet Oral Once per day on Mon Wed Fri    Infusions: . sodium chloride 10 mL/hr at 11/24/20 1600    PRN Medications: HYDROcodone-acetaminophen, ipratropium-albuterol, nitroGLYCERIN, sodium chloride, sodium chloride flush,  traZODone   Assessment/Plan   1. 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 He has a wide LBBB. Echo this admission with basal-mid inferior akinesis, dyssynchrony from LBBB, EF 20-25%, moderate LVH, mildly decreased RV function. RHC/LHC with normal filling pressures, marginal CI (2.1 by thermodilution is likely more accurate than Fick); cath showed 50% proximal LAD and occluded RCA (chronic).  Extent of cardiomyopathy is not explained by coronary disease, suspect there is a nonischemic component likely from cardiac sarcoidosis.  Cardiac MRI showed only nonspecific RV insertion site LGE (findings were not highly suggestive of cardiac sarcoidosis), but study was technically difficult.  Volume status good.  - Continue low dose Coreg 3.125 mg bid (watch with 1st degree AVB).   - Continue Lasix 40 mg po daily   - Continue digoxin 0.125, follow closely with 1st degree AVB.  - Continue Entresto 24/26 bid.   - Continue spironolactone 25 mg daily.  - Will not add SGLT2 inhibitor yet with uncontrolled diabetes.  - May ultimately need CRT-D placement (has had low EF since 2018, has wide LBBB, high risk for arrhythmias with suspected cardiac sarcoidosis).  EP has evaluated. No plans for ICD this admit. Will re-evaluate w/ repeat echo after 3 months of GDMT. - Will eventually need cardiac PET for assessment of cardiac involvement by sarcoidosis (will need to be done at [facility] as outpatient).  2. CAD: Presentation with CHF, not ACS.  LHC with 50% pLAD, totally occluded proximal RCA with R=>R collaterals (old occlusion).  Reviewed with interventional cardiology, would hold off on PCI attempt at this time as CTO.  - ASA 81 daily - Crestor 20 mg daily.  3. OHS/OSA: Needs CPAP, on oxygen during the day. 4. Sarcoidosis: High resolution CT chest showed dense bilateral consolidations and enlarged hilar/mediastinal nodes.  Does not appear infectious.  Sarcoid is likely unifying diagnosis for  heart and lungs. ACE level elevated.  He has had bronch/biopsy, path suggestive of sarcoidosis.  Intubated post-bronch with bronchospasm/hypotension and now extubated.  - Starting prednisone 60 mg daily, slow taper over months.  - Bactrim for PJP prophylaxis. - Starting weekly MTX for steroid sparing, continue long-term.  5. Smoking: Needs to quit.   6. Type 2 diabetes: Per primary service.  7. NSVT/PVCs: Stable.  - Continue Coreg. - May eventually need ICD as above.   Nearing D/c. Waiting on MD to clear. Will arrange [facility] hospital f/u and place appt info in AVS.   Length of Stay: [address] PA-C  11/27/2020, 7:09 AM  Advanced Heart Failure Team Pager [phone] (M-F; 7a - 5p)  Please contact [facility] for night-coverage after hours (5p -7a ) and weekends on [email or web address]  Patient seen with PA, agree with the above note.    No complaints this morning.  Has been out of bed.    Creatinine stable.  CVP not functioning.  Co-ox 64%.    General: NAD Neck: Thick. No JVD, no thyromegaly or thyroid nodule.  Lungs: Clear to auscultation bilaterally with normal respiratory effort. CV: Nondisplaced PMI.  Heart regular S1/S2, no S3/S4, no murmur.  No peripheral edema.   Abdomen: Soft, nontender, no hepatosplenomegaly, no distention.  Skin: Intact without lesions or rashes.  Neurologic: Alert and oriented x 3.  Psych: Normal affect. Extremities: No clubbing or cyanosis.  HEENT: Normal.   Stable on po medication regimen.  EP saw yesterday, they want him on GDMT for 3 months then repeat echo prior to CRT-D implantation (he was not taking any cardiac meds for about 2 yrs prior to admission).    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  Pulmonary managing this. He will need cardiac PET as outpatient at [facility] to assess for active inflammation in heart to determine rate of steroid taper.  Euvolemic on exam, continue Lasix 40 mg  daily.   From my standpoint, he can go home if pulmonary and [facility] agree.  Since he is not getting ICD  yet, we will see if he would be willing to wear Lifevest.  He will need close followup in CHF and pulmonary clinics.  Will need cardiac PET at [facility] arranged as outpatient.  Cardiac meds for discharge: spironolactone 25 mg daily, Entresto 24/26 bid, Coreg 3.125 bid, digoxin 0.125 daily, ASA 81 daily, Lasix 40 mg daily, KCl 20 mEq daily, Crestor 20 mg daily, prednisone and MTX per pulmonary.   Marca Ancona 11/27/2020 9:23 AM

## 2020-11-27 NOTE — Progress Notes (Signed)
[facility] PROGRESS NOTE    Marc Wright  UUV:[id number] DOB: 07/09/1975 DOA: 11/15/2020 PCP: Patient, No Pcp Per (Inactive)      Brief Narrative:  This is a 45 y.o. male patient with a history of chronic systolic CHF, hypertension, obstructive sleep apnea on CPAP, diabetes mellitus type 2 and tobacco abuse was admitted on 11/15/2020 after he presented with shortness of breath.  Patient had bronchoscopy on 11/21/2020, extubated on 11/23/2020.    Past Medical History:  Diagnosis Date  . Asthma   . CHF (congestive heart failure) (HCC)   . Diabetes mellitus without complication (HCC)   . OSA on CPAP   . Pneumonia 06/2016      Assessment & Plan:  Pulmonary sarcoidosis with cardiac involvement. Currently on 60 mg of prednisone daily to be slowly tapered down as an outpatient.  He is also on weekly methotrexate. Bactrim 3 times a week for PJP prophylaxis.  Acute on chronic systolic heart failure With mixed ischemic and nonischemic cardiomyopathy.  Currently on furosemide and Coreg.  He is also on Entresto.  Cardiology follow-up is appreciated.  2D echocardiogram showed left ventricular ejection fraction of 20 to 25%.  EP recommending 3 months of GDMT and repeat 2D echo thereafter.  No plans for ICD placement at this time.  Patient is unsure about LifeVest but will consider. Cardiac PET for evaluation of sarcoidosis to be done at [facility] as an outpatient.  Obstructive sleep apnea on CPAP nightly.  Diabetes mellitus type 2. Not well controlled.  Increase dose of Lantus to 48 units twice daily.  Continue with aspart insulin 3 times daily with meals.         [name] aspirin EC  81 mg Oral Daily  . carvedilol  3.125 mg Oral BID WC  . digoxin  0.125 mg Oral Daily  . enoxaparin (LOVENOX) injection  80 mg Subcutaneous Q24H  . fluticasone  2 spray Each Nare Daily  . folic acid  1 mg Oral Daily  . furosemide  40 mg Oral Daily  . insulin aspart  0-20 Units Subcutaneous TID WC   . insulin aspart  0-5 Units Subcutaneous QHS  . insulin aspart  10 Units Subcutaneous TID WC  . insulin glargine  45 Units Subcutaneous BID  . mouth rinse  15 mL Mouth Rinse BID  . [START ON 11/30/2020] methotrexate  20 mg Oral Weekly  . multivitamin with minerals  1 tablet Oral Daily  . nicotine  21 mg Transdermal Daily  . predniSONE  60 mg Oral Q breakfast  . rosuvastatin  20 mg Oral Daily  . sacubitril-valsartan  1 tablet Oral BID  . sodium chloride flush  10-40 mL Intracatheter Q12H  . spironolactone  25 mg Oral Daily  . sulfamethoxazole-trimethoprim  1 tablet Oral Once per day on Mon Wed Fri     Current Meds  Medication Sig  . cetirizine (ZYRTEC) 10 MG tablet Take 10 mg by mouth daily.  [name] ibuprofen (ADVIL) 200 MG tablet Take 200 mg by mouth every 6 (six) hours as needed for moderate pain.        Disposition: Status is: Inpatient  Remains inpatient appropriate because:Ongoing diagnostic testing needed not appropriate for outpatient work up   Dispo: The patient is from: Home              Anticipated d/c is to: Home              Patient currently is not medically stable  to d/c.   Difficult to place patient No              MDM: The below labs and imaging reports were reviewed and summarized above.  Medication management as above.     DVT prophylaxis:       Consultants:   Cardiology, critical care.  Procedures:   Status post bronchoscopy           Subjective: Patient reports that he has no shortness of breath at rest.  Denies chest pain.  He reported that he was able to ambulate in the room with assistance.  Objective: Vitals:   11/27/20 0900 11/27/20 0948 11/27/20 1131 11/27/20 1519  BP: 101/72   121/87  Pulse:  90 85 96  Resp:    15  Temp:   97.9 F (36.6 C) 98.6 F (37 C)  TempSrc:   Oral Oral  SpO2:   91% 93%  Weight:      Height:        Intake/Output Summary (Last 24 hours) at 11/27/2020 1616 Last data filed at  11/27/2020 1520 Gross per 24 hour  Intake 1085 ml  Output 2950 ml  Net -1865 ml   Filed Weights   11/26/20 0215 11/27/20 0018 11/27/20 0536  Weight: (!) 158.8 kg (!) 161.6 kg (!) 161.6 kg    Examination: General appearance: Lying in bed, in no form of pain or respiratory distress. General: Not in obvious distress, alert awake . HENT:   No scleral pallor or icterus noted. Oral mucosa is moist.  Chest:   Clear to auscultation bilaterally except few rales at the bases. CVS: S1 &S2 heard. No murmur.  Regular rate and rhythm. Abdomen: Soft, nontender, nondistended.  Bowel sounds are normoactive. Extremities: No cyanosis, or edema.   Psych: Alert, awake.  Normal mood and affect CNS:  No cranial nerve deficits.  Power equal in all extremities.   Skin: Warm and dry.  No rashes noted.   Data Reviewed: I have personally reviewed following labs and imaging studies:  CBC: Recent Labs  Lab 11/21/20 0556 11/21/20 1550 11/22/20 0443 11/22/20 0759 11/24/20 0230 11/25/20 0314 11/26/20 0430 11/27/20 0420  WBC 6.1  --  12.4*  --  13.7* 11.7* 9.5 10.2  NEUTROABS 3.7  --  10.5*  --  10.6* 8.8* 5.8  --   HGB 19.4*   < > 18.9* 19.0* 17.6* 17.8* 18.8* 18.0*  HCT 58.5*   < > 58.0* 56.0* 53.6* 55.0* 58.5* 56.3*  MCV 85.5  --  86.2  --  86.3 85.7 86.2 85.6  PLT 231  --  320  --  198 204 206 198   < > = values in this interval not displayed.   Basic Metabolic Panel: Recent Labs  Lab 11/22/20 1107 11/22/20 1635 11/23/20 0450 11/24/20 0230 11/24/20 0912 11/25/20 0314 11/26/20 0430 11/27/20 0420  NA  --   --  126* 135  --  135 137 135  K  --   --  4.8 4.0  --  3.7 3.6 3.6  CL  --   --  96* 99  --  99 99 98  CO2  --   --  18* 28  --  30 33* 33*  GLUCOSE  --   --  349* 162*  --  198* 174* 167*  BUN  --   --  28* 28*  --  21* 20 18  CREATININE  --   --  0.71 0.64  --  0.62 0.79 0.62  CALCIUM  --   --  7.3* 8.8*  --  8.6* 8.9 8.8*  MG 1.4* 1.4* 2.3  --  1.9  --   --   --   PHOS 2.3* 2.2*  3.0  --   --   --   --   --    GFR: Estimated Creatinine Clearance: 186.2 mL/min (by C-G formula based on SCr of 0.62 mg/dL). Liver Function Tests: Recent Labs  Lab 11/21/20 0556 11/22/20 0443 11/24/20 0230 11/25/20 0314 11/26/20 0430  AST 27 20 18 23 25   ALT 42 43 26 32 42  ALKPHOS 96 91 85 78 85  BILITOT 0.6 0.9 0.8 0.4 0.8  PROT 6.5 6.7 6.3* 6.5 6.6  ALBUMIN 3.3* 3.4* 3.3* 3.2* 3.2*   No results for input(s): LIPASE, AMYLASE in the last 168 hours. No results for input(s): AMMONIA in the last 168 hours. Coagulation Profile: No results for input(s): INR, PROTIME in the last 168 hours. Cardiac Enzymes: No results for input(s): CKTOTAL, CKMB, CKMBINDEX, TROPONINI in the last 168 hours. BNP (last 3 results) No results for input(s): PROBNP in the last 8760 hours. HbA1C: No results for input(s): HGBA1C in the last 72 hours. CBG: Recent Labs  Lab 11/26/20 2058 11/27/20 0516 11/27/20 0943 11/27/20 1129 11/27/20 1517  GLUCAP 303* 171* 219* 228* 389*   Lipid Profile: Recent Labs    11/25/20 0314 11/27/20 0437  CHOL  --  135  HDL  --  34*  LDLCALC  --  76  TRIG 130 126  CHOLHDL  --  4.0   Thyroid Function Tests: No results for input(s): TSH, T4TOTAL, FREET4, T3FREE, THYROIDAB in the last 72 hours. Anemia Panel: No results for input(s): VITAMINB12, FOLATE, FERRITIN, TIBC, IRON, RETICCTPCT in the last 72 hours. Urine analysis:    Component Value Date/Time   COLORURINE YELLOW 11/15/2020 2259   APPEARANCEUR CLEAR 11/15/2020 2259   LABSPEC 1.038 (H) 11/15/2020 2259   PHURINE 6.0 11/15/2020 2259   GLUCOSEU >=500 (A) 11/15/2020 2259   HGBUR NEGATIVE 11/15/2020 2259   BILIRUBINUR NEGATIVE 11/15/2020 2259   KETONESUR NEGATIVE 11/15/2020 2259   PROTEINUR 100 (A) 11/15/2020 2259   NITRITE NEGATIVE 11/15/2020 2259   LEUKOCYTESUR NEGATIVE 11/15/2020 2259   Sepsis Labs: @LABRCNTIP (procalcitonin:4,lacticacidven:4)  ) Recent Results (from the past 240 hour(s))  MRSA  PCR Screening     Status: None   Collection Time: 11/21/20  3:03 PM   Specimen: Nasopharyngeal  Result Value Ref Range Status   MRSA by PCR NEGATIVE NEGATIVE Final    Comment:        The GeneXpert MRSA Assay (FDA approved for NASAL specimens only), is one component of a comprehensive MRSA colonization surveillance program. It is not intended to diagnose MRSA infection nor to guide or monitor treatment for MRSA infections. Performed at [facility], [address] [place], [address] [place]          Radiology Studies: No results found.      Scheduled Meds: . aspirin EC  81 mg Oral Daily  . carvedilol  3.125 mg Oral BID WC  . digoxin  0.125 mg Oral Daily  . enoxaparin (LOVENOX) injection  80 mg Subcutaneous Q24H  . fluticasone  2 spray Each Nare Daily  . folic acid  1 mg Oral Daily  . furosemide  40 mg Oral Daily  . insulin aspart  0-20 Units Subcutaneous TID WC  . insulin aspart  0-5 Units Subcutaneous QHS  .  insulin aspart  10 Units Subcutaneous TID WC  . insulin glargine  45 Units Subcutaneous BID  . mouth rinse  15 mL Mouth Rinse BID  . [START ON 11/30/2020] methotrexate  20 mg Oral Weekly  . multivitamin with minerals  1 tablet Oral Daily  . nicotine  21 mg Transdermal Daily  . predniSONE  60 mg Oral Q breakfast  . rosuvastatin  20 mg Oral Daily  . sacubitril-valsartan  1 tablet Oral BID  . sodium chloride flush  10-40 mL Intracatheter Q12H  . spironolactone  25 mg Oral Daily  . sulfamethoxazole-trimethoprim  1 tablet Oral Once per day on Mon Wed Fri   Continuous Infusions: . sodium chloride 10 mL/hr at 11/24/20 1600     LOS: 12 days    Time spent: 25 minutes    Marlynn Hinckley Arvella Merles, MD [facility] 11/27/2020, 4:16 PM     Please page though AMION or Epic secure chat:  For [employer], Higher education careers adviser

## 2020-11-27 NOTE — Progress Notes (Signed)
Inpatient Diabetes Program Recommendations  AACE/ADA: New Consensus Statement on Inpatient Glycemic Control (2015)  Target Ranges:  Prepandial:   less than 140 mg/dL      Peak postprandial:   less than 180 mg/dL (1-2 hours)      Critically ill patients:  140 - 180 mg/dL   Lab Results  Component Value Date   GLUCAP 228 (H) 11/27/2020   HGBA1C 11.5 (H) 11/16/2020    Review of Glycemic Control Results for Marc Wright, Marc Wright (MRN [id number]) as of 11/27/2020 13:00  Ref. Range 11/26/2020 20:58 11/27/2020 05:16 11/27/2020 09:43 11/27/2020 11:29  Glucose-Capillary Latest Ref Range: 70 - 99 mg/dL 201 (H) 007 (H) 121 (H) 228 (H)   Diabetes history: Type 2 DM Outpatient Diabetes medications: none Current orders for Inpatient glycemic control: Novolog 10 units TID, Lantus 45 units BID, Novolog 0-20 units TID & HS Prednisone to taper in future  Inpatient Diabetes Program Recommendations:    In preparation for discharge, recommend Novolog 70/30 BID. Patient can obtain from [facility].  Thanks, Lujean Rave, MSN, RNC-OB Diabetes Coordinator [phone] (8a-5p)

## 2020-11-28 ENCOUNTER — Other Ambulatory Visit: Payer: Self-pay

## 2020-11-28 DIAGNOSIS — Z9889 Other specified postprocedural states: Secondary | ICD-10-CM

## 2020-11-28 LAB — COOXEMETRY PANEL
Carboxyhemoglobin: 1 % (ref 0.5–1.5)
Methemoglobin: 0.8 % (ref 0.0–1.5)
O2 Saturation: 58.5 %
Total hemoglobin: 16.7 g/dL — ABNORMAL HIGH (ref 12.0–16.0)

## 2020-11-28 LAB — BASIC METABOLIC PANEL
Anion gap: 7 (ref 5–15)
BUN: 21 mg/dL — ABNORMAL HIGH (ref 6–20)
CO2: 31 mmol/L (ref 22–32)
Calcium: 8.9 mg/dL (ref 8.9–10.3)
Chloride: 100 mmol/L (ref 98–111)
Creatinine, Ser: 0.67 mg/dL (ref 0.61–1.24)
GFR, Estimated: >60 mL/min (ref >60)
Glucose, Bld: 83 mg/dL (ref 70–99)
Potassium: 3.8 mmol/L (ref 3.5–5.1)
Sodium: 138 mmol/L (ref 135–145)

## 2020-11-28 LAB — GLUCOSE, CAPILLARY
Glucose-Capillary: 92 mg/dL (ref 70–99)
Glucose-Capillary: 150 mg/dL — ABNORMAL HIGH (ref 70–99)
Glucose-Capillary: 387 mg/dL — ABNORMAL HIGH (ref 70–99)

## 2020-11-28 LAB — CBC
HCT: 54.6 % — ABNORMAL HIGH (ref 39.0–52.0)
Hemoglobin: 17.6 g/dL — ABNORMAL HIGH (ref 13.0–17.0)
MCH: 27.7 pg (ref 26.0–34.0)
MCHC: 32.2 g/dL (ref 30.0–36.0)
MCV: 85.8 fL (ref 80.0–100.0)
Platelets: 200 10*3/uL (ref 150–400)
RBC: 6.36 MIL/uL — ABNORMAL HIGH (ref 4.22–5.81)
RDW: 13.5 % (ref 11.5–15.5)
WBC: 10.5 10*3/uL (ref 4.0–10.5)
nRBC: 0 % (ref 0.0–0.2)

## 2020-11-28 LAB — ANTINUCLEAR ANTIBODIES, IFA: ANA Ab, IFA: NEGATIVE

## 2020-11-28 LAB — MAGNESIUM: Magnesium: 1.8 mg/dL (ref 1.7–2.4)

## 2020-11-28 MED ORDER — SALINE SPRAY 0.65 % NA SOLN
1.0000 | NASAL | 0 refills | Status: AC | PRN
  Filled 2020-11-28: qty 480, fill #0

## 2020-11-28 MED ORDER — SACUBITRIL-VALSARTAN 24-26 MG PO TABS
1.0000 | ORAL_TABLET | Freq: Two times a day (BID) | ORAL | 3 refills | Status: AC
  Filled 2020-11-28: qty 60, 30d supply, fill #0

## 2020-11-28 MED ORDER — IPRATROPIUM-ALBUTEROL 0.5-2.5 (3) MG/3ML IN SOLN
3.0000 mL | RESPIRATORY_TRACT | 0 refills | Status: AC | PRN
  Filled 2020-11-28: qty 360, 20d supply, fill #0

## 2020-11-28 MED ORDER — FUROSEMIDE 40 MG PO TABS
40.0000 mg | ORAL_TABLET | Freq: Every day | ORAL | 3 refills | Status: AC
  Filled 2020-11-28: qty 30, 30d supply, fill #0

## 2020-11-28 MED ORDER — DIGOXIN 125 MCG PO TABS
0.1250 mg | ORAL_TABLET | Freq: Every day | ORAL | 1 refills | Status: AC
  Filled 2020-11-28: qty 30, 30d supply, fill #0

## 2020-11-28 MED ORDER — ROSUVASTATIN CALCIUM 20 MG PO TABS
20.0000 mg | ORAL_TABLET | Freq: Every day | ORAL | 0 refills | Status: AC
  Filled 2020-11-28: qty 30, 30d supply, fill #0

## 2020-11-28 MED ORDER — CARVEDILOL 3.125 MG PO TABS
3.1250 mg | ORAL_TABLET | Freq: Two times a day (BID) | ORAL | 11 refills | Status: AC
  Filled 2020-11-28: qty 60, 30d supply, fill #0

## 2020-11-28 MED ORDER — ASPIRIN 81 MG PO TBEC
81.0000 mg | DELAYED_RELEASE_TABLET | Freq: Every day | ORAL | 11 refills | Status: AC
  Filled 2020-11-28: qty 30, 30d supply, fill #0

## 2020-11-28 MED ORDER — TRUE METRIX METER W/DEVICE KIT
PACK | 0 refills | Status: AC
  Filled 2020-11-28: qty 1, 1d supply, fill #0

## 2020-11-28 MED ORDER — PREDNISONE 20 MG PO TABS
60.0000 mg | ORAL_TABLET | Freq: Every day | ORAL | 3 refills | Status: AC
  Filled 2020-11-28: qty 30, 10d supply, fill #0

## 2020-11-28 MED ORDER — METHOTREXATE 2.5 MG PO TABS
20.0000 mg | ORAL_TABLET | ORAL | 0 refills | Status: AC
  Filled 2020-11-28: qty 16, 14d supply, fill #0

## 2020-11-28 MED ORDER — NITROGLYCERIN 0.4 MG SL SUBL
0.4000 mg | SUBLINGUAL_TABLET | SUBLINGUAL | 3 refills | Status: AC | PRN
  Filled 2020-11-28: qty 25, 10d supply, fill #0

## 2020-11-28 MED ORDER — SULFAMETHOXAZOLE-TRIMETHOPRIM 800-160 MG PO TABS
1.0000 | ORAL_TABLET | ORAL | 11 refills | Status: AC
Start: 2020-11-28 — End: ?
  Filled 2020-11-28: qty 12, 28d supply, fill #0

## 2020-11-28 MED ORDER — TRUE METRIX BLOOD GLUCOSE TEST VI STRP
ORAL_STRIP | 0 refills | Status: AC
  Filled 2020-11-28: qty 100, 25d supply, fill #0

## 2020-11-28 MED ORDER — TRUEPLUS LANCETS 28G MISC
0 refills | Status: AC
Start: 2020-11-28 — End: ?
  Filled 2020-11-28: qty 100, 25d supply, fill #0

## 2020-11-28 MED ORDER — INSULIN ASPART PROT & ASPART (70-30 MIX) 100 UNIT/ML PEN
45.0000 [IU] | PEN_INJECTOR | Freq: Two times a day (BID) | SUBCUTANEOUS | 11 refills | Status: AC
  Filled 2020-11-28: qty 30, 33d supply, fill #0
  Filled 2020-11-28: qty 15, 17d supply, fill #0

## 2020-11-28 MED ORDER — ACETAMINOPHEN 500 MG PO TABS: 500.0000 mg | ORAL_TABLET | Freq: Four times a day (QID) | ORAL | Status: DC | PRN

## 2020-11-28 MED ORDER — FLUTICASONE PROPIONATE 50 MCG/ACT NA SUSP
2.0000 | Freq: Every day | NASAL | 2 refills | Status: AC
  Filled 2020-11-28: qty 16, 30d supply, fill #0

## 2020-11-28 MED ORDER — SPIRONOLACTONE 25 MG PO TABS
25.0000 mg | ORAL_TABLET | Freq: Every day | ORAL | 11 refills | Status: AC
  Filled 2020-11-28: qty 30, 30d supply, fill #0

## 2020-11-28 NOTE — Care Management (Addendum)
ED RNCM received call from [place] RN on 3E concerning patients discharge medications.  ED RNCM reviewed chart and noted prescriptions were sent to [facility] by Unit CM along with MATCH medication assistance Letter.  Patient was waiting on LifeVest before he could be d/c'd  Home.  [facility] is now closed, patient will not be able to pick up meds until tomorrow morning, also informed that patient will need a glucometer as well.  RNCM sent secure message to Dr. Mahala Menghini to send prescription to the [facility] along with his other prescriptions. Discussed with [facility] CM concerning patient needing a sooner HFU appointment as well, she will f/u with him tomorrow as well. Information has been placed on the AVS and will be reviewed by RN prior to discharge. No further ED RNCM needs identified.

## 2020-11-28 NOTE — Progress Notes (Signed)
Discharge instructions given to and reviewed with patient. Patient verbalized understanding of all discharge instructions including follow up appointments, changes to medications and new prescriptions along with the need to pick up his medications tomorrow morning at the [facility].  Patient verbalized understanding of where to go to get his meds stating he has been there once. Patient will also get a glucometer there as well. Patient instructed on life vest by zoll representative also this evening. All questions answered. PICC line removed by IV team RN prior to discharge. Patient left unit alert with life vest on and no distress noted by wheelchair with Shanda Bumps, NT. All equipment sent with patient and discharged home with his brother driving.

## 2020-11-28 NOTE — Discharge Summary (Signed)
Physician Discharge Summary  Marc Wright GHW:[id number] DOB: Apr 29, 1976 DOA: 11/15/2020  PCP: Patient, No Pcp Per (Inactive)  Admit date: 11/15/2020 Discharge date: 11/28/2020  Time spent: 55 minutes  Recommendations for Outpatient Follow-up:  1. Patient will need Chem-12 CBC in 1 week at [facility] 2. Titration of medications in the outpatient setting as per advanced heart failure team-see list of meds below that were recommended for him to discharge on 3. Outpatient evaluation by pulmonology who will be CCed on this note for consideration of de-escalation of steroids--- please follow-up on ANA ordered on 11/21/2020 4. Further outpatient planning for various procedures including cardiac MRI, ICD placement CPAP titration per specialists 5. Life vest to be placed as per cardiology 6. The patient is dry weight should be considered as 162 kg--please consider outpatient bariatric surgery referral  Discharge Diagnoses:  MAIN problem for hospitalization   Acute hypoxic respiratory failure secondary to multiple components-including acute systolic heart failure elevated troponins, pulmonary sarcoid with mediastinotomy lymphadenopathy  Please see below for itemized issues addressed in Marc Wright- refer to other progress notes for clarity if needed  Discharge Condition: Fair  Diet recommendation: Diabetic heart healthy  Filed Weights   11/27/20 0018 11/27/20 0536 11/28/20 0449  Weight: (!) 161.6 kg (!) 161.6 kg (!) 162.4 kg    History of present illness:  45 white male BMI 49 HFrEF 25% OSA noncompliant on CPAP DM TY 2 Current tobacco abuse half pack per day Occasional THC remote cocaine nothing recently Patient reportedly noncompliant on meds/CPAP  Present [facility] sore throat sputum difficulty breathing-at home self treated with several dose of Lasix Baseline does have orthopnea but has worsened over past several weeks  Rx in ED Lasix given BNP was 500 lactic acid  normal CXR = right hilar opacity CT showing multifocal consolidation  Cardiology and pulmonology consulted   he was diuresed relatively aggressively looks like he might of become hypercarbic on 5/29 and mandatory BiPAP was enforced--at night and he is improved R/L cath 5/31 as below 100% RCA stenosis Went bronchoscopy and was subsequently intubated and remained on ventilator until extubation 6/3 as below Tried reassumed care of the patient on 6/6   Hospital Course:   [name] Hospital Events: Including procedures, antibiotic start and stop dates in addition to other pertinent events    5/25-10/2024 admitted to hospital, began diuresed, got dose of Solu-Medrol 60 mg IV x1  5/27 pulmonary consulted  5/30 Most respiratory symptoms resolved or markedly improved  5/31 -s/p [facility] w/ Dr. Aundra Dubin. ok for patient to undergo bronch/EBUS under [facility] while as inpatient based on cath results.  6/1 Underwent bronchoscopy with EBUS, on vent  6/3 extubated   Acute decompensated HFrEF EF 25% Advanced heart failure team feels patient is euvolemic and stabilized from their perspective Medications reconciled as per their recommendations in their note Net I/O: = -11 L, weight 180-->162 kilograms Diamox was transiently used and subsequently discontinued Patient will need screening labs within a week and TOC visit with cardiology/heart failure team CC Dr. Marigene Ehlers Hypoxic respiratory failure secondary to difficulty with weaning off of vent secondary to  CAP Pneumonia   Obstructive and restrictive breathing habitus extubated on 6/3 and stabilized from a pulmonary perspective Patient received full course of antibiotics earlier his hospital stay As OP, needs PSG versus other outpatient modalities for treatment of his underlying OHSS needs which will be coordinated by pulmonology in the outpatient setting CC Dr. 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 =  infiltrative pattern with  akinesis EF 20-25% ESR and CRP elevated-ANA is negative Cardiac MRI as below Needs Bactrim DS 3 times a week for PJP prophylaxis and will need outpatient discussion about this medication in addition to steroids and down titration of the same as per pulmonology Bronchoscopy performed 6/1 Cytology 6/1 showed granulomatous inflammation in lymph nodes from FNA C Scleroderma Sjogren's CCP rheumatoid anti-DNA antibody quant affirm ANCA anti-GBM all negative ANA pending Hyperglycemia without diagnosis of diabetes mellitus A1c reported above 11-- patient has used metformin in the past-he was resumed on metformin 500 and will need titration in the outpatient setting He was discharged with 70/30 insulin in addition to glucometer and will need education in the outpatient setting with regards to use of meter and proper glycemic control Family is familiar with use of glucometer as this patient as he has used it before when he had pneumonia Smoker not wheezing on discharge-improved-we will prescribe nebulizer BMI 46 Patient habitus life-threatening Consider outpatient possible GOP-outpatient bariatric referral?   Right Heart  Right Heart Pressures RHC Procedural Findings: Hemodynamics (mmHg) RA mean 2 RV 31/3 PA 30/15, mean 21 PCWP mean 8 LV 101/15 AO 100/71  Oxygen saturations: PA 60% AO 93%  Cardiac Output (Fick) 4.6  Cardiac Index (Fick) 1.64   Cardiac Output (Thermo) 5.98  Cardiac Index (Thermo) 2.13       Procedures: Cardiac MRI: 1. Bilateral airspace disease, see dedicated chest CT from earlier this admission. 2. Severely dilated left ventricle with marked septal-lateral dyssynchrony as well as inferior severe hypokinesis and septal severe hypokinesis. Difficult to quantify EF due to dyssynchrony, but calculated at 18%. 3. Mildly dilated RV with severe systolic dysfunction, EF 33%. 4. Poor delayed enhancement images with significant artifact. Probably inferior RV  insertion site LGE (nonspecific).  RHC/LHC: Coronary Findings   Diagnostic Dominance: Right  Left Anterior Descending  Prox LAD lesion is 50% stenosed.  Right Coronary Artery  Prox RCA lesion is 100% stenosed. There are weak right-right collaterals, no left-right collaterals.     Consultations:  Pulmonology  Heart failure team  Discharge Exam: Vitals:   11/28/20 0751 11/28/20 1006  BP:    Pulse: 89 94  Resp:    Temp:    SpO2:      Subj on day of d/c   Awake sitting up comfortable on room air no distress Denies shortness of breath chills rigors fever nausea vomiting dark stool tarry tarry stool lower extremity edema  General Exam on discharge  Thick neck Mallampati 4 No icterus no pallor No submandibular lymphadenopathy CTA B no rales no rhonchi Abdomen obese nontender no rebound no guarding Multiple tattoos No lower extremity edema Power 5/5 moving all 4 limbs equally  Discharge Instructions   Discharge Instructions    Diet - low sodium heart healthy   Complete by: As directed    Discharge instructions   Complete by: As directed    Please take a careful look at your medication as you will be discharged on multiple new medications Some of these have been clarified by the cardiology team and some have been prescribed by the pulmonary team You will in addition need lab work in about 1week community health and wellness because some of the medications can cause dehydration You will be starting this admission on diabetes medications including metformin and 70/30 insulin because of your diabetes that we found out this admission-you will need to use a glucometer to check your sugars carefully and if they are persistently  above 250 you will need to keep a log of these and show them to your primary physician who can adjust your blood sugar For any chest pain severe shortness of breath report to the ED Please report to your regular physician for transition visit  within the time span allocated by case management   Discharge instructions   Complete by: As directed    Please look at your medications carefully as several have changed   Increase activity slowly   Complete by: As directed    Increase activity slowly   Complete by: As directed      Allergies as of 11/28/2020      Reactions   Azithromycin Itching, Rash   Burning rash with intravenous dose      Medication List    STOP taking these medications   cetirizine 10 MG tablet Commonly known as: ZYRTEC   fexofenadine 180 MG tablet Commonly known as: ALLEGRA   ibuprofen 200 MG tablet Commonly known as: ADVIL   lisinopril 5 MG tablet Commonly known as: ZESTRIL   ondansetron 4 MG disintegrating tablet Commonly known as: Zofran ODT     TAKE these medications   aspirin 81 MG EC tablet Take 1 tablet (81 mg total) by mouth daily. Swallow whole. Start taking on: November 29, 2020   blood glucose meter kit and supplies Kit Dispense based on patient and insurance preference. Use up to four times daily as directed.   carvedilol 3.125 MG tablet Commonly known as: COREG Take 1 tablet (3.125 mg total) by mouth 2 (two) times daily with a meal. What changed:   medication strength  how much to take   digoxin 0.125 MG tablet Commonly known as: LANOXIN Take 1 tablet (0.125 mg total) by mouth daily. Start taking on: November 29, 2020   fluticasone 50 MCG/ACT nasal spray Commonly known as: FLONASE Place 2 sprays into both nostrils daily. Start taking on: November 29, 2020   furosemide 40 MG tablet Commonly known as: LASIX Take 1 tablet (40 mg total) by mouth daily. Start taking on: November 29, 2020 What changed:   medication strength  how much to take  when to take this   HYDROcodone-acetaminophen 5-325 MG tablet Commonly known as: NORCO/VICODIN Take 2 tablets by mouth every 6 (six) hours as needed.   insulin aspart protamine - aspart (70-30) 100 UNIT/ML FlexPen Commonly known as: NOVOLOG  70/30 MIX Inject 0.45 mLs (45 Units total) into the skin 2 (two) times daily with a meal.   ipratropium-albuterol 0.5-2.5 (3) MG/3ML Soln Commonly known as: DUONEB Take 3 mLs by nebulization every 4 (four) hours as needed.   metFORMIN 500 MG tablet Commonly known as: GLUCOPHAGE Take 1 tablet (500 mg total) by mouth 2 (two) times daily with a meal.   methotrexate 10 MG tablet Commonly known as: RHEUMATREX Take 2 tablets (20 mg total) by mouth once a week. Caution:Chemotherapy. Protect from light. Start taking on: November 30, 2020   nitroGLYCERIN 0.4 MG SL tablet Commonly known as: NITROSTAT Place 1 tablet (0.4 mg total) under the tongue every 5 (five) minutes as needed for chest pain.   predniSONE 20 MG tablet Commonly known as: DELTASONE Take 3 tablets (60 mg total) by mouth daily with breakfast. Start taking on: November 29, 2020   rosuvastatin 20 MG tablet Commonly known as: CRESTOR Take 1 tablet (20 mg total) by mouth daily. Start taking on: November 29, 2020   sacubitril-valsartan 24-26 MG Commonly known as: ENTRESTO Take 1 tablet  by mouth 2 (two) times daily.   sodium chloride 0.65 % Soln nasal spray Commonly known as: OCEAN Place 1 spray into both nostrils as needed for congestion.   spironolactone 25 MG tablet Commonly known as: ALDACTONE Take 1 tablet (25 mg total) by mouth daily. Start taking on: November 29, 2020   sulfamethoxazole-trimethoprim 800-160 MG tablet Commonly known as: BACTRIM DS Take 1 tablet by mouth 3 (three) times a week.            Durable Medical Equipment  (From admission, onward)         Start     Ordered   11/28/20 0821  For home use only DME Vest life vest  Once       Comments: EF <35%, NSVT  Duration 3 months   11/28/20 0821         Allergies  Allergen Reactions  . Azithromycin Itching and Rash    Burning rash with intravenous dose    Follow-up [place]. Go on 12/27/2020.   Why:  @9 :30am Contact information: [place] [address] [phone]       [facility] Follow up.   Specialty: Cardiology Why: December 12, 2020 at 3:30 PM. At the [facility], [place] [address] Contact information: [address] [id number] [place]       Evans Lance, MD Follow up.   Specialty: Cardiology Why: 03/08/2021 @ 9:00AM Contact information: [address] [place] [address] [phone]        [place]. Go on 12/27/2020.   Why: @9 :30am Contact information: [address] [place] [address] [phone]               The results of significant diagnostics from this hospitalization (including imaging, microbiology, ancillary and laboratory) are listed below for reference.    Significant Diagnostic Studies: DG Chest 1 View  Result Date: 11/17/2020 CLINICAL DATA:  Chest pain and shortness of breath EXAM: CHEST  1 VIEW COMPARISON:  11/15/2020 FINDINGS: Cardiomegaly is again identified and stable. Increased vascular congestion is noted with slight increase in pulmonary edema when compare with the prior exam. Persistent medial airspace density is noted in the right base. No bony abnormality is noted. IMPRESSION: Persistent right basilar infiltrate with increasing pulmonary edema and vascular congestion. Electronically Signed   By: Inez Catalina M.D.   On: 11/17/2020 16:28   DG Chest 2 View  Result Date: 11/18/2020 CLINICAL DATA:  45 year old male with history of pneumonia. EXAM: CHEST - 2 VIEW COMPARISON:  11/17/2020 FINDINGS: The mediastinal contours are within normal limits. Unchanged moderate cardiomegaly. Similar appearing multifocal, patchy bilateral pulmonary opacities, most prominent in the right lower  peribronchovascular region. No pleural effusions or pneumothorax. No acute osseous abnormality. IMPRESSION: Similar appearing patchy bilateral pulmonary opacities and unchanged chronic right lower peribronchovascular opacification. Unchanged cardiomegaly. Electronically Signed   By: Ruthann Cancer MD   On: 11/18/2020 09:03   CT Angio Chest PE W and/or Wo Contrast  Result Date: 11/15/2020 CLINICAL DATA:  Abnormal chest x-ray, cough, fever EXAM: CT ANGIOGRAPHY CHEST WITH CONTRAST TECHNIQUE: Multidetector CT imaging of the chest was performed using the standard protocol during bolus administration of intravenous contrast. Multiplanar CT image reconstructions and MIPs were obtained to evaluate the vascular anatomy. CONTRAST:  178m OMNIPAQUE IOHEXOL 350 MG/ML SOLN COMPARISON:  11/06/2018 FINDINGS: Cardiovascular: There is adequate opacification of the pulmonary arterial tree. No intraluminal filling defect identified to suggest acute pulmonary embolism. The central pulmonary arteries are of normal caliber. Mild coronary artery calcification. There is mild global cardiomegaly with left ventricular dilation noted. No pericardial effusion. The thoracic aorta is unremarkable. Mediastinum/Nodes: There is extensive mediastinal and bilateral hilar adenopathy present, possibly reactive or inflammatory in nature. This appears similar to that noted on prior examination. Thyroid unremarkable. Esophagus unremarkable. Lungs/Pleura: There are multiple regions of pulmonary consolidation involving the superior segments of the lower lobes bilaterally, the central right middle lobe, and peripherally within the left upper lobe. This appears similar, though progressive since prior examination both in appearance and location. Small right pleural effusion is present. No pneumothorax. Central airways are widely patent. Upper Abdomen: No acute abnormality. Specifically, the spleen does not appear enlarged. Musculoskeletal: No lytic or  blastic bone lesion is identified. No acute bone abnormality. Review of the MIP images confirms the above findings. IMPRESSION: No pulmonary embolism. Multifocal pulmonary consolidation similar in appearance though progressive since remote prior examination of 11/06/2018, with extensive mediastinal and bilateral hilar adenopathy. Given its chronicity, differential considerations are led by granulomatous and immunologic conditions such as sarcoidosis or granulomatosis with polyangiitis. Progressive cardiomegaly now with left ventricular dilation. Given the above findings, cardiac sarcoidosis could be considered. Electronically Signed   By: AFidela SalisburyMD   On: 11/15/2020 21:28   CARDIAC CATHETERIZATION  Result Date: 11/20/2020  Prox RCA lesion is 100% stenosed.  Prox LAD lesion is 50% stenosed.  1. Normal filling pressures. 2. Low cardiac output.  Mildly decreased by thermodilution which is likely more accurate as oxygen saturation was up and down during case so unsure of Fick accuracy. 3. Totally occluded RCA is likely chronic.  There are R=>R collaterals, not L=>R collaterals.  50% stenosis proximal LAD. Suspect mixed ischemic/nonischemic cardiomyopathy.   CT Chest High Resolution  Result Date: 11/19/2020 CLINICAL DATA:  Persistent infiltrates on CT, evaluate round atelectasis versus true infiltrate EXAM: CT CHEST WITHOUT CONTRAST TECHNIQUE: Multidetector CT imaging of the chest was performed following the standard protocol without intravenous contrast. High resolution imaging of the lungs, as well as inspiratory and expiratory imaging, was performed. COMPARISON:  11/15/2020, 11/06/2018 FINDINGS: Cardiovascular: Cardiomegaly. Three-vessel coronary artery calcifications. No pericardial effusion. Mediastinum/Nodes: Numerous enlarged mediastinal and bilateral hilar lymph nodes are unchanged, largest pretracheal nodes measuring 2.5 x 1.9 cm (series 11, image 50). Thyroid gland, trachea, and esophagus  demonstrate no significant findings. Lungs/Pleura: There are multiple dense bilateral consolidations, generally with spiculated margins and internal air bronchograms. The largest and most dense consolidations are in the bilateral superior segments of the lower lobes (series 12, image 82). These are unchanged compared to recent CT dated 11/15/2020 but are slightly increased in size and density in comparison to the prior dated 11/06/2018. There may be some very fine associated nodularity and there is a background of fine centrilobular nodularity. No significant air trapping on expiratory phase imaging. No pleural effusion or pneumothorax. Upper Abdomen: No acute abnormality. Musculoskeletal: No chest wall mass or suspicious bone lesions identified. IMPRESSION: 1. There are multiple dense bilateral consolidations, generally with spiculated margins and internal air bronchograms. The largest and most dense consolidations are in the bilateral superior segments of the lower lobes. These are unchanged compared to recent CT dated 11/15/2020 are slightly increased in size and density in comparison to the prior dated 11/06/2018. There may be  some very fine associated nodularity. This unusual constellation of findings, in combination with enlarged mediastinal and hilar lymph nodes, suggests pulmonary sarcoidosis with progressive massive fibrosis. 2. Numerous enlarged mediastinal and bilateral hilar lymph nodes, unchanged, nonspecific, although as above, may reflect nodal sarcoidosis. 3. Cardiomegaly and coronary artery disease. Electronically Signed   By: Eddie Candle M.D.   On: 11/19/2020 17:21   DG CHEST PORT 1 VIEW  Result Date: 11/21/2020 CLINICAL DATA:  Check PICC line placement EXAM: PORTABLE CHEST 1 VIEW COMPARISON:  11/21/2020 FINDINGS: Endotracheal tube and gastric catheter are noted in satisfactory position. New right-sided PICC line is noted with the catheter tip at the cavoatrial junction. The overall inspiratory  effort is poor with persistent atelectatic changes in the left mid lung. Right basilar atelectasis is noted as well. IMPRESSION: Stable appearance of the chest. New right-sided PICC line with the tip at the cavoatrial junction in satisfactory position. Electronically Signed   By: Inez Catalina M.D.   On: 11/21/2020 22:14   DG CHEST PORT 1 VIEW  Result Date: 11/21/2020 CLINICAL DATA:  Post bronchoscopy. EXAM: PORTABLE CHEST 1 VIEW COMPARISON:  One-view chest x-ray 11/18/2020 FINDINGS: Patient has been intubated. Endotracheal tube terminates 3.7 cm above the carina. Heart is enlarged. NG tube courses off the inferior border the film. Increasing interstitial and airspace opacities are present bilaterally. Small effusions are suspected. IMPRESSION: 1. Cardiomegaly with increasing interstitial and airspace opacities bilaterally, compatible with edema or atelectasis. 2. No pneumothorax. 3. Satisfactory positioning of endotracheal tube. Electronically Signed   By: San Morelle M.D.   On: 11/21/2020 16:05   DG Chest Port 1 View  Result Date: 11/15/2020 CLINICAL DATA:  Shortness of breath with cough and fever. EXAM: PORTABLE CHEST 1 VIEW COMPARISON:  11/06/2018 FINDINGS: 1657 hours. Low lung volumes. The cardio pericardial silhouette is enlarged. There is pulmonary vascular congestion without overt pulmonary edema. Right hilar and infrahilar opacity noted, progressive in the interval. Telemetry leads overlie the chest. IMPRESSION: 1. Low lung volumes with pulmonary vascular congestion. Component of interstitial edema not excluded. 2. Progressive opacity in the right hilar and infrahilar region. All imaging features may be related to infectious/inflammatory etiology, neoplasm could have this appearance. CT chest with contrast recommended to further evaluate. Electronically Signed   By: Misty Stanley M.D.   On: 11/15/2020 17:21   DG Abd Portable 1V  Result Date: 11/21/2020 CLINICAL DATA:  Check gastric  catheter placement EXAM: PORTABLE ABDOMEN - 1 VIEW COMPARISON:  None. FINDINGS: Gastric catheter is noted within the stomach. No obstructive changes are seen. No free air is noted. IMPRESSION: Gastric catheter within the stomach. Electronically Signed   By: Inez Catalina M.D.   On: 11/21/2020 16:57   MR CARDIAC MORPHOLOGY W WO CONTRAST  Result Date: 11/21/2020 CLINICAL DATA:  Suspect prior MI, also concern for cardiac sarcoidosis. EXAM: CARDIAC MRI TECHNIQUE: The patient was scanned on a 1.5 Tesla GE magnet. A dedicated cardiac coil was used. Functional imaging was done using Fiesta sequences. 2,3, and 4 chamber views were done to assess for RWMA's. Modified Simpson's rule using a short axis stack was used to calculate an ejection fraction on a dedicated work Conservation officer, nature. The patient received 8 cc of Gadavist. After 10 minutes inversion recovery sequences were used to assess for infiltration and scar tissue. FINDINGS: Limited images of the lung fields showed bilateral airspace disease better visualized on recent CT chest. Severely dilated left ventricle with normal wall thickness. Inferior severe hypokinesis. The  septal wall is severely hypokinetic and there is marked septal-lateral dyssynchrony. Overall LV EF calculated at 18%. Mildly dilated right ventricle with EF 17%. Mild left atrial enlargement, normal right atrium. trileaflet aortic valve with no significant stenosis or regurgitation. No significant mitral regurgitation noted. Poor quality delayed enhancement images. Possibly some inferior RV insertion site late gadolinium enhancement (LGE), but unable to be definitive. Measurements: LVEDV 440 mL LVSV 78 mL LVEF 18% RVEDV 287 mL RVSV 50 mL RVEF 17% IMPRESSION: 1. Bilateral airspace disease, see dedicated chest CT from earlier this admission. 2. Severely dilated left ventricle with marked septal-lateral dyssynchrony as well as inferior severe hypokinesis and septal severe hypokinesis.  Difficult to quantify EF due to dyssynchrony, but calculated at 18%. 3.  Mildly dilated RV with severe systolic dysfunction, EF 31%. 4. Poor delayed enhancement images with significant artifact. Probably inferior RV insertion site LGE. Dalton Mclean Electronically Signed   By: Loralie Champagne M.D.   On: 11/21/2020 14:21   ECHOCARDIOGRAM COMPLETE  Result Date: 11/16/2020    ECHOCARDIOGRAM REPORT   Patient Name:   GREER KOEPPEN Date of Exam: 11/16/2020 Medical Rec #:  [id number]     Height:       75.0 in Accession #:    [id number]    Weight:       397.9 lb Date of Birth:  [birth year]     BSA:          2.940 m Patient Age:    45 years      BP:           131/98 mmHg Patient Gender: M             HR:           100 bpm. Exam Location:  Inpatient Procedure: 2D Echo, Cardiac Doppler and Color Doppler Indications:    Cardiac Sarcoidosis D86.85                 CHF-Acute Systolic W58.09  History:        Patient has prior history of Echocardiogram examinations, most                 recent 06/27/2016. CHF; Risk Factors:Diabetes.  Sonographer:    Bernadene Person RDCS Referring Phys: [place]  1. Akinesis noted of the basal to mid septum and basal to mid inferior wall. There is also septal movement consistent with LBBB. Unusual pattern of akinesis for coronary distribution noted. Would consider infiltrative cardiomyopathy such as sarcoidosis and would recommend a cardiac MRI for clarification. Left ventricular ejection fraction, by estimation, is 20 to 25%. The left ventricle has severely decreased function. The left ventricle demonstrates regional wall motion abnormalities (see scoring diagram/findings for description). The left ventricular internal cavity size was moderately dilated. There is moderate concentric left ventricular hypertrophy. Indeterminate diastolic filling due to E-A fusion.  2. Right ventricular systolic function is mildly reduced. The right ventricular size is mildly enlarged. There is  mildly elevated pulmonary artery systolic pressure. The estimated right ventricular systolic pressure is 98.3 mmHg.  3. The mitral valve is grossly normal. Trivial mitral valve regurgitation. No evidence of mitral stenosis.  4. The aortic valve is tricuspid. Aortic valve regurgitation is not visualized. No aortic stenosis is present.  5. The inferior vena cava is dilated in size with <50% respiratory variability, suggesting right atrial pressure of 15 mmHg. Comparison(s): No significant change from prior study. EF remains similar ~20-25%. WMAs noted above. FINDINGS  Left  Ventricle: Akinesis noted of the basal to mid septum and basal to mid inferior wall. There is also septal movement consistent with LBBB. Unusual pattern of akinesis for coronary distribution noted. Would consider infiltrative cardiomyopathy such as  sarcoidosis and would recommend a cardiac MRI for clarification. Left ventricular ejection fraction, by estimation, is 20 to 25%. The left ventricle has severely decreased function. The left ventricle demonstrates regional wall motion abnormalities. The  left ventricular internal cavity size was moderately dilated. There is moderate concentric left ventricular hypertrophy. Abnormal (paradoxical) septal motion, consistent with left bundle branch block. Indeterminate diastolic filling due to E-A fusion.  LV Wall Scoring: The inferior wall, basal anteroseptal segment, mid inferoseptal segment, and basal inferoseptal segment are akinetic. Right Ventricle: The right ventricular size is mildly enlarged. No increase in right ventricular wall thickness. Right ventricular systolic function is mildly reduced. There is mildly elevated pulmonary artery systolic pressure. The tricuspid regurgitant  velocity is 2.64 m/s, and with an assumed right atrial pressure of 15 mmHg, the estimated right ventricular systolic pressure is 40.0 mmHg. Left Atrium: Left atrial size was normal in size. Right Atrium: Right atrial size  was normal in size. Pericardium: Trivial pericardial effusion is present. Mitral Valve: The mitral valve is grossly normal. Trivial mitral valve regurgitation. No evidence of mitral valve stenosis. Tricuspid Valve: The tricuspid valve is grossly normal. Tricuspid valve regurgitation is mild . No evidence of tricuspid stenosis. Aortic Valve: The aortic valve is tricuspid. Aortic valve regurgitation is not visualized. No aortic stenosis is present. Pulmonic Valve: The pulmonic valve was grossly normal. Pulmonic valve regurgitation is not visualized. No evidence of pulmonic stenosis. Aorta: The aortic root and ascending aorta are structurally normal, with no evidence of dilitation. Venous: The inferior vena cava is dilated in size with less than 50% respiratory variability, suggesting right atrial pressure of 15 mmHg. IAS/Shunts: The atrial septum is grossly normal. Additional Comments: A device lead is visualized in the right ventricle.  LEFT VENTRICLE PLAX 2D LVIDd:         6.50 cm LVIDs:         5.70 cm LV PW:         1.40 cm LV IVS:        1.40 cm LVOT diam:     2.30 cm LV SV:         63 LV SV Index:   21 LVOT Area:     4.15 cm  LV Volumes (MOD) LV vol d, MOD A2C: 292.0 ml LV vol d, MOD A4C: 234.0 ml LV vol s, MOD A2C: 225.0 ml LV vol s, MOD A4C: 153.0 ml LV SV MOD A2C:     67.0 ml LV SV MOD A4C:     234.0 ml LV SV MOD BP:      71.6 ml RIGHT VENTRICLE RV S prime:     8.33 cm/s TAPSE (M-mode): 1.9 cm LEFT ATRIUM             Index       RIGHT ATRIUM           Index LA diam:        5.10 cm 1.73 cm/m  RA Area:     28.60 cm LA Vol (A2C):   67.2 ml 22.86 ml/m RA Volume:   106.00 ml 36.06 ml/m LA Vol (A4C):   67.0 ml 22.79 ml/m LA Biplane Vol: 73.4 ml 24.97 ml/m  AORTIC VALVE LVOT Vmax:   100.90 cm/s LVOT Vmean:  68.633  cm/s LVOT VTI:    0.152 m  AORTA Ao Root diam: 3.58 cm Ao Asc diam:  3.70 cm TRICUSPID VALVE TR Peak grad:   27.9 mmHg TR Vmax:        264.00 cm/s  SHUNTS Systemic VTI:  0.15 m Systemic Diam: 2.30  cm Eleonore Chiquito MD Electronically signed by Eleonore Chiquito MD Signature Date/Time: 11/16/2020/1:57:34 PM    Final    VAS [place] LOWER EXTREMITY VENOUS (DVT)  Result Date: 11/16/2020  Lower Venous DVT Study Patient Name:  OTHMAR RINGER  Date of Exam:   11/16/2020 Medical Rec #: [id number]      Accession #:    [id number] Date of Birth: 06-25-1975      Patient Gender: M Patient Age:   1Y Exam Location:  [facility] Procedure:      VAS [place] LOWER EXTREMITY VENOUS (DVT) Referring Phys: Olga --------------------------------------------------------------------------------  Indications: Edema.  Risk Factors: Chronic CHF - noncompliant. Comparison Study: Previous 12/19/18 Negative Performing Technologist: Rogelia Rohrer  Examination Guidelines: A complete evaluation includes B-mode imaging, spectral Doppler, color Doppler, and power Doppler as needed of all accessible portions of each vessel. Bilateral testing is considered an integral part of a complete examination. Limited examinations for reoccurring indications may be performed as noted. The reflux portion of the exam is performed with the patient in reverse Trendelenburg.  +---------+---------------+---------+-----------+----------+--------------+ RIGHT    CompressibilityPhasicitySpontaneityPropertiesThrombus Aging +---------+---------------+---------+-----------+----------+--------------+ CFV      Full           Yes      Yes                                 +---------+---------------+---------+-----------+----------+--------------+ SFJ      Full                                                        +---------+---------------+---------+-----------+----------+--------------+ FV Prox  Full           Yes      Yes                                 +---------+---------------+---------+-----------+----------+--------------+ FV Mid   Full           Yes      Yes                                  +---------+---------------+---------+-----------+----------+--------------+ FV DistalFull           Yes      Yes                                 +---------+---------------+---------+-----------+----------+--------------+ PFV      Full                                                        +---------+---------------+---------+-----------+----------+--------------+ POP      Full  Yes      Yes                                 +---------+---------------+---------+-----------+----------+--------------+ PTV      Full                                                        +---------+---------------+---------+-----------+----------+--------------+ PERO     Full                                                        +---------+---------------+---------+-----------+----------+--------------+   +---------+---------------+---------+-----------+----------+--------------+ LEFT     CompressibilityPhasicitySpontaneityPropertiesThrombus Aging +---------+---------------+---------+-----------+----------+--------------+ CFV      Full           Yes      Yes                                 +---------+---------------+---------+-----------+----------+--------------+ SFJ      Full                                                        +---------+---------------+---------+-----------+----------+--------------+ FV Prox  Full           Yes      Yes                                 +---------+---------------+---------+-----------+----------+--------------+ FV Mid   Full           Yes      Yes                                 +---------+---------------+---------+-----------+----------+--------------+ FV DistalFull           Yes      Yes                                 +---------+---------------+---------+-----------+----------+--------------+ PFV      Full                                                         +---------+---------------+---------+-----------+----------+--------------+ POP      Full           Yes      Yes                                 +---------+---------------+---------+-----------+----------+--------------+ PTV      Full                                                        +---------+---------------+---------+-----------+----------+--------------+  PERO     Full                                                        +---------+---------------+---------+-----------+----------+--------------+     Summary: BILATERAL: - No evidence of deep vein thrombosis seen in the lower extremities, bilaterally. - No evidence of superficial venous thrombosis in the lower extremities, bilaterally. -No evidence of popliteal cyst, bilaterally.   *See table(s) above for measurements and observations. Electronically signed by Monica Martinez MD on 11/16/2020 at 12:40:12 PM.    Final    [place] EKG SITE RITE  Result Date: 11/21/2020 If Site Rite image not attached, placement could not be confirmed due to current cardiac rhythm.  [place] EKG SITE RITE  Result Date: 11/21/2020 If Site Rite image not attached, placement could not be confirmed due to current cardiac rhythm.  DG C-ARM BRONCHOSCOPY  Result Date: 11/21/2020 C-ARM BRONCHOSCOPY: Fluoroscopy was utilized by the requesting physician.  No radiographic interpretation.    Microbiology: Recent Results (from the past 240 hour(s))  MRSA PCR Screening     Status: None   Collection Time: 11/21/20  3:03 PM   Specimen: Nasopharyngeal  Result Value Ref Range Status   MRSA by PCR NEGATIVE NEGATIVE Final    Comment:        The GeneXpert MRSA Assay (FDA approved for NASAL specimens only), is one component of a comprehensive MRSA colonization surveillance program. It is not intended to diagnose MRSA infection nor to guide or monitor treatment for MRSA infections. Performed at [facility], [place] [address] [place], [place] [address]       Labs: Basic Metabolic Panel: Recent Labs  Lab 11/22/20 1107 11/22/20 1635 11/23/20 0450 11/24/20 0230 11/24/20 0912 11/25/20 0314 11/26/20 0430 11/27/20 0420 11/28/20 0432  NA  --   --  126* 135  --  135 137 135 138  K  --   --  4.8 4.0  --  3.7 3.6 3.6 3.8  CL  --   --  96* 99  --  99 99 98 100  CO2  --   --  18* 28  --  30 33* 33* 31  GLUCOSE  --   --  349* 162*  --  198* 174* 167* 83  BUN  --   --  28* 28*  --  21* 20 18 21*  CREATININE  --   --  0.71 0.64  --  0.62 0.79 0.62 0.67  CALCIUM  --   --  7.3* 8.8*  --  8.6* 8.9 8.8* 8.9  MG 1.4* 1.4* 2.3  --  1.9  --   --   --  1.8  PHOS 2.3* 2.2* 3.0  --   --   --   --   --   --    Liver Function Tests: Recent Labs  Lab 11/22/20 0443 11/24/20 0230 11/25/20 0314 11/26/20 0430  AST 20 18 23 25   ALT 43 26 32 42  ALKPHOS 91 85 78 85  BILITOT 0.9 0.8 0.4 0.8  PROT 6.7 6.3* 6.5 6.6  ALBUMIN 3.4* 3.3* 3.2* 3.2*   No results for input(s): LIPASE, AMYLASE in the last 168 hours. No results for input(s): AMMONIA in the last 168 hours. CBC: Recent Labs  Lab 11/22/20 0443 11/22/20 0759 11/24/20  0230 11/25/20 0314 11/26/20 0430 11/27/20 0420 11/28/20 0432  WBC 12.4*  --  13.7* 11.7* 9.5 10.2 10.5  NEUTROABS 10.5*  --  10.6* 8.8* 5.8  --   --   HGB 18.9*   < > 17.6* 17.8* 18.8* 18.0* 17.6*  HCT 58.0*   < > 53.6* 55.0* 58.5* 56.3* 54.6*  MCV 86.2  --  86.3 85.7 86.2 85.6 85.8  PLT 320  --  198 204 206 198 200   < > = values in this interval not displayed.   Cardiac Enzymes: No results for input(s): CKTOTAL, CKMB, CKMBINDEX, TROPONINI in the last 168 hours. BNP: BNP (last 3 results) Recent Labs    11/15/20 1556  BNP 598.1*    ProBNP (last 3 results) No results for input(s): PROBNP in the last 8760 hours.  CBG: Recent Labs  Lab 11/27/20 0943 11/27/20 1129 11/27/20 1517 11/27/20 2139 11/28/20 0558  GLUCAP 219* 228* 389* 294* 92       Signed:  Nita Sells MD   [facility] 11/28/2020, 11:03 AM

## 2020-11-28 NOTE — Progress Notes (Signed)
PT Cancellation Note  Patient Details Name: [name] MRN: [id number] DOB: 1975/11/16   Cancelled Treatment:    Reason Eval/Treat Not Completed: Patient declined, no reason specified. Pt reports foot pain as well, also declines due to recent ambulation with cardiac rehab. PT will follow up as time allows.   Arlyss Gandy 11/28/2020, 4:28 PM

## 2020-11-28 NOTE — Progress Notes (Addendum)
Patient ID: Marc Wright, male   DOB: 11-24-1975, 45 y.o.   MRN: [id number]     Advanced Heart Failure Rounding Note  PCP-Cardiologist: None   Subjective:    Developed bronchospasm/hypotension with bronchoscopy/biopsy on 6/1, intubated. Started on Solumedrol.  Extubated 6/3, off pressors.   EP has seen. No plans for ICD this admit. Will re-evaluate after 3 months of GDMT.   He agrees to [employer].   Stable from volume/ symptom standpoint.   Cardiac MRI: 1. Bilateral airspace disease, see dedicated chest CT from earlier this admission. 2. Severely dilated left ventricle with marked septal-lateral dyssynchrony as well as inferior severe hypokinesis and septal severe hypokinesis. Difficult to quantify EF due to dyssynchrony, but calculated at 18%. 3. Mildly dilated RV with severe systolic dysfunction, EF 17%. 4. Poor delayed enhancement images with significant artifact. Probably inferior RV insertion site LGE (nonspecific).  RHC/LHC: Coronary Findings   Diagnostic Dominance: Right  Left Anterior Descending  Prox LAD lesion is 50% stenosed.  Right Coronary Artery  Prox RCA lesion is 100% stenosed. There are weak right-right collaterals, no left-right collaterals.   Intervention   No interventions have been documented.  Right Heart  Right Heart Pressures RHC Procedural Findings: Hemodynamics (mmHg) RA mean 2 RV 31/3 PA 30/15, mean 21 PCWP mean 8 LV 101/15 AO 100/71  Oxygen saturations: PA 60% AO 93%  Cardiac Output (Fick) 4.6  Cardiac Index (Fick) 1.64   Cardiac Output (Thermo) 5.98  Cardiac Index (Thermo) 2.13      Objective:   Weight Range: (!) 162.4 kg Body mass index is 44.75 kg/m.   Vital Signs:   Temp:  [97.5 F (36.4 C)-98.6 F (37 C)] 97.7 F (36.5 C) (06/08 0727) Pulse Rate:  [78-104] 89 (06/08 0751) Resp:  [15-18] 18 (06/08 0726) BP: (101-154)/(72-104) 119/89 (06/08 0726) SpO2:  [90 %-94 %] 94 % (06/08 0727) Weight:  [162.4 kg] 162.4  kg (06/08 0449) Last BM Date: 11/24/20  Weight change: Filed Weights   11/27/20 0018 11/27/20 0536 11/28/20 0449  Weight: (!) 161.6 kg (!) 161.6 kg (!) 162.4 kg    Intake/Output:   Intake/Output Summary (Last 24 hours) at 11/28/2020 0758 Last data filed at 11/28/2020 0452 Gross per 24 hour  Intake 605 ml  Output 2725 ml  Net -2120 ml      Physical Exam   PHYSICAL EXAM: General:  Well appearing, obese. No respiratory difficulty HEENT: normal Neck: supple. no JVD. Carotids 2+ bilat; no bruits. No lymphadenopathy or thyromegaly appreciated. Cor: PMI nondisplaced. Regular rate & rhythm. No rubs, gallops or murmurs. Lungs: clear Abdomen: soft, nontender, nondistended. No hepatosplenomegaly. No bruits or masses. Good bowel sounds. Extremities: no cyanosis, clubbing, rash, edema Neuro: alert & oriented x 3, cranial nerves grossly intact. moves all 4 extremities w/o difficulty. Affect pleasant.    Telemetry   NSR 80s (personally reviewed).    Labs    CBC Recent Labs    11/26/20 0430 11/27/20 0420 11/28/20 0432  WBC 9.5 10.2 10.5  NEUTROABS 5.8  --   --   HGB 18.8* 18.0* 17.6*  HCT 58.5* 56.3* 54.6*  MCV 86.2 85.6 85.8  PLT 206 198 200   Basic Metabolic Panel Recent Labs    38/17/71 0420 11/28/20 0432  NA 135 138  K 3.6 3.8  CL 98 100  CO2 33* 31  GLUCOSE 167* 83  BUN 18 21*  CREATININE 0.62 0.67  CALCIUM 8.8* 8.9  MG  --  1.8   Liver  Function Tests Recent Labs    11/26/20 0430  AST 25  ALT 42  ALKPHOS 85  BILITOT 0.8  PROT 6.6  ALBUMIN 3.2*   No results for input(s): LIPASE, AMYLASE in the last 72 hours. Cardiac Enzymes No results for input(s): CKTOTAL, CKMB, CKMBINDEX, TROPONINI in the last 72 hours.  BNP: BNP (last 3 results) Recent Labs    11/15/20 1556  BNP 598.1*    ProBNP (last 3 results) No results for input(s): PROBNP in the last 8760 hours.   D-Dimer No results for input(s): DDIMER in the last 72 hours. Hemoglobin A1C No  results for input(s): HGBA1C in the last 72 hours. Fasting Lipid Panel Recent Labs    11/27/20 0437  CHOL 135  HDL 34*  LDLCALC 76  TRIG 323  CHOLHDL 4.0   Thyroid Function Tests No results for input(s): TSH, T4TOTAL, T3FREE, THYROIDAB in the last 72 hours.  Invalid input(s): FREET3  Other results:   Imaging    No results found.   Medications:     Scheduled Medications: . aspirin EC  81 mg Oral Daily  . carvedilol  3.125 mg Oral BID WC  . Chlorhexidine Gluconate Cloth  6 each Topical Daily  . digoxin  0.125 mg Oral Daily  . enoxaparin (LOVENOX) injection  80 mg Subcutaneous Q24H  . fluticasone  2 spray Each Nare Daily  . folic acid  1 mg Oral Daily  . furosemide  40 mg Oral Daily  . insulin aspart  0-20 Units Subcutaneous TID WC  . insulin aspart  0-5 Units Subcutaneous QHS  . insulin aspart  10 Units Subcutaneous TID WC  . insulin glargine  48 Units Subcutaneous BID  . mouth rinse  15 mL Mouth Rinse BID  . [START ON 11/30/2020] methotrexate  20 mg Oral Weekly  . multivitamin with minerals  1 tablet Oral Daily  . nicotine  21 mg Transdermal Daily  . predniSONE  60 mg Oral Q breakfast  . rosuvastatin  20 mg Oral Daily  . sacubitril-valsartan  1 tablet Oral BID  . sodium chloride flush  10-40 mL Intracatheter Q12H  . spironolactone  25 mg Oral Daily  . sulfamethoxazole-trimethoprim  1 tablet Oral Once per day on Mon Wed Fri    Infusions: . sodium chloride 10 mL/hr at 11/24/20 1600    PRN Medications: acetaminophen, HYDROcodone-acetaminophen, ipratropium-albuterol, nitroGLYCERIN, sodium chloride, sodium chloride flush, traZODone   Assessment/Plan   1. 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 He has a wide LBBB. Echo this admission with basal-mid inferior akinesis, dyssynchrony from LBBB, EF 20-25%, moderate LVH, mildly decreased RV function. RHC/LHC with normal filling pressures, marginal CI (2.1 by thermodilution is likely  more accurate than Fick); cath showed 50% proximal LAD and occluded RCA (chronic).  Extent of cardiomyopathy is not explained by coronary disease, suspect there is a nonischemic component likely from cardiac sarcoidosis. Cardiac MRI showed only nonspecific RV insertion site LGE (findings were not highly suggestive of cardiac sarcoidosis), but study was technically difficult.  Volume status good.  - Continue low dose Coreg 3.125 mg bid (watch with 1st degree AVB).   - Continue Lasix 40 mg po daily   - Continue digoxin 0.125, follow closely with 1st degree AVB.  - Continue Entresto 24/26 bid.   - Continue spironolactone 25 mg daily.  - Will not add SGLT2 inhibitor yet with uncontrolled diabetes.  - May ultimately need CRT-D placement (has had low EF since 2018, has wide  LBBB, high risk for arrhythmias with suspected cardiac sarcoidosis).  EP has evaluated. No plans for ICD this admit. Will re-evaluate w/ repeat echo after 3 months of GDMT. Will send home w/ LifeVest.  - Will eventually need cardiac PET for assessment of cardiac involvement by sarcoidosis (will need to be done at [facility] as outpatient).  2. CAD: Presentation with CHF, not ACS.  LHC with 50% pLAD, totally occluded proximal RCA with R=>R collaterals (old occlusion).  Reviewed with interventional cardiology, would hold off on PCI attempt at this time as CTO.  - ASA 81 daily - Crestor 20 mg daily.  3. OHS/OSA: Needs CPAP, on oxygen during the day. 4. Sarcoidosis: High resolution CT chest showed dense bilateral consolidations and enlarged hilar/mediastinal nodes.  Does not appear infectious.  Sarcoid is likely unifying diagnosis for heart and lungs. ACE level elevated.  He has had bronch/biopsy, path suggestive of sarcoidosis.  Intubated post-bronch with bronchospasm/hypotension and now extubated.  - Starting prednisone 60 mg daily, slow taper over months.  - Bactrim for PJP prophylaxis. - Starting weekly MTX for steroid sparing, continue  long-term.  5. Smoking: Needs to quit.   6. Type 2 diabetes: Per primary service.  7. NSVT/PVCs: Stable.  - Continue Coreg. - May eventually need ICD as above. D/c home w/ LifeVest.   LifeVest order placed. [employer] rep notified. Home after device is delivered.   Length of Stay: [address], PA-C  11/28/2020, 7:58 AM  Advanced Heart Failure Team Pager [phone] (M-F; 7a - 5p)  Please contact [facility] for night-coverage after hours (5p -7a ) and weekends on [email or web address]  Agree with the above PA note.  Stable on po medication regimen.  EP want him on GDMT for 3 months then repeat echo prior to CRT-D implantation (he was not taking any cardiac meds for about 2 yrs prior to admission).    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  Pulmonary managing this. He will need cardiac PET as outpatient at [facility] to assess for active inflammation in heart to determine rate of steroid taper.  Euvolemic on exam, continue Lasix 40 mg daily.   From my standpoint, he can go home today.  Since he is not getting ICD yet, we will see if he would be willing to wear Lifevest => he is agreeable.  He will need close followup in CHF and pulmonary clinics.  Will need cardiac PET at [facility] arranged as outpatient.  Cardiac meds for discharge: spironolactone 25 mg daily, Entresto 24/26 bid, Coreg 3.125 bid, digoxin 0.125 daily, ASA 81 daily, Lasix 40 mg daily, KCl 20 mEq daily, Crestor 20 mg daily, prednisone and MTX per pulmonary.   Marca Ancona 11/28/2020 8:28 AM

## 2020-11-28 NOTE — TOC Transition Note (Addendum)
Transition of Care [facility]) - CM/SW Discharge Note   Patient Details  Name: Marc Wright MRN: [id number] Date of Birth: 1976/02/19  Transition of Care [facility]) CM/SW Contact:  Leone Haven, RN Phone Number: 11/28/2020, 12:41 PM   Clinical Narrative:    NCM  Spoke with patient, he is for dc today, he states he does not want HHPT or outpatient PT. He states he will be fine he will be going [facility] [employer] with his brother.  He will also be getting a life vest today.  Alvino Chapel with [employer] will be here around 2 or 3 pm today, information has been faxed to [employer].  Also patient will be going to [facility] to pick up his medications.  He states he does have a little cash on him to get meds. His brother will transport him home today. NCM will try to assist with Match letter also ,he will have a lot of meds to get from the clinic. NCM faxed the Match letter over to [facility].    Final next level of care: Home/Self Care Barriers to Discharge: No Barriers Identified   Patient Goals and CMS Choice Patient states their goals for this hospitalization and ongoing recovery are:: return home   Choice offered to / list presented to : NA  Discharge Placement                       Discharge Plan and Services In-house Referral: Clinical Social Work                DME Agency: NA       HH Arranged: Refused HH          Social Determinants of Health (SDOH) Interventions Food Insecurity Interventions: Intervention Not Indicated Financial Strain Interventions: Chiropodist (Comment) (Provided patient with a CAFA application referral to [facility] for PCP and [facility] outpatient clinic for medication assistance) Housing Interventions: Intervention Not Indicated Transportation Interventions: Intervention Not Indicated   Readmission Risk Interventions No flowsheet data found.

## 2020-11-28 NOTE — Progress Notes (Signed)
CARDIAC REHAB PHASE I   PRE:  Rate/Rhythm: 98 SR    BP: sitting 103/67    SaO2: 92 RA  MODE:  Ambulation: 330 ft   POST:  Rate/Rhythm: 109 ST    BP: sitting 109/87     SaO2: 93 RA  Pt had not ambulated hall but eager to do so. C/o left foot instability/lack of control. Walked independently at slow pace, x1 LOB which he was able to catch himself. Denied SOB, VSS. C/o muscle weakness which seemed to be improving with ambulation. Reviewed HF management. He did not recall sodium restriction amount. Gave him extra low sodium and DM diet sheets. Discussed walking (he has a cane and RW) and in depth discussion of smoking cessation. He wants to quit and plans to use nicotine replacement therapy at d/c. Gave resources. Will not refer for CRPII due to lack of insurance and work schedule. Pt requested a scale therefore contacted CSW who plans to provide one. [phone]   Harriet Masson CES, ACSM 11/28/2020 2:11 PM

## 2020-11-28 NOTE — Progress Notes (Signed)
Spoke with Dr. Mahala Menghini via secure chat and let him know that patient's blood glucose after eating is 387. CBG was not obtained prior to him eating dinner, let MD know that per sliding scale and scheduled meal coverage patient needs 30 units total of novolog and is being discharged home. MD gave order to go ahead and give the 30 units of novolog now.

## 2020-11-28 NOTE — Progress Notes (Addendum)
CSW spoke with patient at bedside and brought him a scale as patient reported not having a scale at home. Mr. Marc Wright is aware of his follow up appointment with the [facility] outpatient clinic. Mr. Marc Wright awaits delivery of a life vest before discharging.  CSW will sign off for now as social work intervention is no longer needed. Please consult [place] again if new needs arise.  Addley Ballinger, MSW, LCSWA [phone] Heart Failure Social Worker

## 2020-11-29 ENCOUNTER — Telehealth: Payer: Self-pay

## 2020-11-29 ENCOUNTER — Other Ambulatory Visit: Payer: Self-pay

## 2020-11-29 NOTE — Telephone Encounter (Signed)
Message received from Michel Bickers, RN CM requesting an appointment at [facility]  He has appointment scheduled for 12/27/2020 but she would like him to be seen sooner by an MD.  Call placed to patient # [phone], message left with call back requested to this CM.

## 2020-11-30 ENCOUNTER — Other Ambulatory Visit: Payer: Self-pay

## 2020-11-30 ENCOUNTER — Telehealth (HOSPITAL_COMMUNITY): Payer: Self-pay | Admitting: Pharmacy Technician

## 2020-11-30 MED ORDER — METFORMIN HCL 500 MG PO TABS
ORAL_TABLET | ORAL | 3 refills | Status: AC
  Filled 2020-11-30: qty 60, 30d supply, fill #0

## 2020-11-30 NOTE — TOC Progression Note (Signed)
Transition of Care [facility]) - Progression Note    Patient Details  Name: Marc Wright MRN: [id number] Date of Birth: [birth year]  Transition of [facility]) CM/SW Contact  Leone Haven, RN Phone Number: 11/30/2020, 12:55 PM  Clinical Narrative:    NCM received information from [place] CSW that patient states he did not get his metformin.  Metformin was not one of the meds sent to [facility] clinic to be filled.  NCM asked MD to send to [facility] clinic.  NCM faxed script for metformin to [facility] clinic to be filled for patient.     Barriers to Discharge: No Barriers Identified  Expected Discharge Plan and Services   In-house Referral: Clinical Social Work       Expected Discharge Date: 11/28/20                 DME Agency: NA       HH Arranged: Refused HH           Social Determinants of Health (SDOH) Interventions Food Insecurity Interventions: Intervention Not Indicated Financial Strain Interventions: Artist, Other (Comment) (Provided patient with a CAFA application referral to [facility] for PCP and [facility] outpatient clinic for medication assistance) Housing Interventions: Intervention Not Indicated Transportation Interventions: Intervention Not Indicated  Readmission Risk Interventions No flowsheet data found.

## 2020-11-30 NOTE — Telephone Encounter (Signed)
Advanced Heart Failure Patient Teacher, English as a foreign language, via fax.   Will follow up.

## 2020-12-03 ENCOUNTER — Telehealth: Payer: Self-pay

## 2020-12-03 NOTE — Telephone Encounter (Signed)
-----   Message from Karren Burly, MD sent at 11/26/2020 10:55 AM EDT ----- Regarding: RE: hosp f/u Ahh. Sent availability in lat week - hopefully will be added soon.  ----- Message ----- From: Maurene Capes, CMA Sent: 11/26/2020  10:47 AM EDT To: Karren Burly, MD Subject: RE: hosp f/u                                   Hey Dr. Judeth Horn! Your July schedule isn't open yet.  ----- Message ----- From: Karren Burly, MD Sent: 11/26/2020  10:23 AM EDT To: Maurene Capes, CMA Subject: hosp f/u                                       Can I see him early July for hosp f/u, 30 mins? Will need full PFT same day. Thanks! Matt

## 2020-12-03 NOTE — Telephone Encounter (Signed)
Left message for patient to call back  

## 2020-12-03 NOTE — Progress Notes (Addendum)
Monday 12/03/2020 - CSW received a call from Marc Wright asking for Novolog 70/30 as he was not discharged with all listed/needed prescriptions on his discharge paperwork. CSW instructed the patient to reach out to [facility] and to try and get a sooner available appointment so that Marc Wright can get all of his needed medications prescribed.  Marc Wright, MSW, LCSWA [phone] Heart Failure Social Worker

## 2020-12-04 ENCOUNTER — Emergency Department (HOSPITAL_COMMUNITY): Payer: Medicaid Other

## 2020-12-04 ENCOUNTER — Emergency Department (HOSPITAL_COMMUNITY)
Admission: EM | Admit: 2020-12-04 | Discharge: 2020-12-21 | Disposition: E | Payer: Medicaid Other | Attending: Emergency Medicine | Admitting: Emergency Medicine

## 2020-12-04 DIAGNOSIS — E119 Type 2 diabetes mellitus without complications: Secondary | ICD-10-CM | POA: Insufficient documentation

## 2020-12-04 DIAGNOSIS — I469 Cardiac arrest, cause unspecified: Secondary | ICD-10-CM | POA: Diagnosis not present

## 2020-12-04 DIAGNOSIS — Z7984 Long term (current) use of oral hypoglycemic drugs: Secondary | ICD-10-CM | POA: Insufficient documentation

## 2020-12-04 DIAGNOSIS — Z7952 Long term (current) use of systemic steroids: Secondary | ICD-10-CM | POA: Diagnosis not present

## 2020-12-04 DIAGNOSIS — I110 Hypertensive heart disease with heart failure: Secondary | ICD-10-CM | POA: Diagnosis not present

## 2020-12-04 DIAGNOSIS — Z794 Long term (current) use of insulin: Secondary | ICD-10-CM | POA: Insufficient documentation

## 2020-12-04 DIAGNOSIS — R609 Edema, unspecified: Secondary | ICD-10-CM | POA: Diagnosis not present

## 2020-12-04 DIAGNOSIS — F17210 Nicotine dependence, cigarettes, uncomplicated: Secondary | ICD-10-CM | POA: Diagnosis not present

## 2020-12-04 DIAGNOSIS — J45909 Unspecified asthma, uncomplicated: Secondary | ICD-10-CM | POA: Insufficient documentation

## 2020-12-04 DIAGNOSIS — Z79899 Other long term (current) drug therapy: Secondary | ICD-10-CM | POA: Diagnosis not present

## 2020-12-04 DIAGNOSIS — I5023 Acute on chronic systolic (congestive) heart failure: Secondary | ICD-10-CM | POA: Insufficient documentation

## 2020-12-04 DIAGNOSIS — R001 Bradycardia, unspecified: Secondary | ICD-10-CM | POA: Insufficient documentation

## 2020-12-04 DIAGNOSIS — Z7982 Long term (current) use of aspirin: Secondary | ICD-10-CM | POA: Diagnosis not present

## 2020-12-04 MED ORDER — EPINEPHRINE 1 MG/10ML IJ SOSY
PREFILLED_SYRINGE | INTRAMUSCULAR | Status: AC | PRN
  Administered 2020-12-04 (×3): 1 mg via INTRAVENOUS

## 2020-12-04 MED ORDER — NOREPINEPHRINE 4 MG/250ML-% IV SOLN
5.0000 ug/min | INTRAVENOUS | Status: DC
Start: 2020-12-04 — End: 2020-12-05

## 2020-12-04 MED ORDER — ASPIRIN 300 MG RE SUPP: 300.0000 mg | RECTAL | Status: DC

## 2020-12-04 MED ORDER — SODIUM CHLORIDE 0.9 % IV SOLN: INTRAVENOUS | Status: DC

## 2020-12-06 LAB — URINE DRUGS OF ABUSE SCREEN W ALC, ROUTINE (REF LAB)
Amphetamines, Urine: NEGATIVE ng/mL
Barbiturate, Ur: NEGATIVE ng/mL
Benzodiazepine Quant, Ur: NEGATIVE ng/mL
Cocaine (Metab.): NEGATIVE ng/mL
Ethanol U, Quan: NEGATIVE %
Methadone Screen, Urine: NEGATIVE ng/mL
Opiate Quant, Ur: NEGATIVE ng/mL
Phencyclidine, Ur: NEGATIVE ng/mL
Propoxyphene, Urine: NEGATIVE ng/mL

## 2020-12-06 LAB — PANEL 799049
CARBOXY THC GC/MS CONF: 99 ng/mL
Cannabinoid GC/MS, Ur: POSITIVE — AB

## 2020-12-12 ENCOUNTER — Encounter (HOSPITAL_COMMUNITY): Payer: Self-pay

## 2020-12-12 NOTE — Telephone Encounter (Signed)
Patient unfortunately passed away on 12/26/2020.   Will close encounter.

## 2020-12-21 NOTE — ED Provider Notes (Signed)
[facility] EMERGENCY DEPARTMENT Provider Note   CSN: [id number] Arrival date & time: 12/16/20  2028     History Chief Complaint  Patient presents with   Cardiac Arrest    Marc Wright is a 45 y.o. male.   Cardiac Arrest   Patient presented to the ED in cardiac arrest.  Patient has a very complex medical history.  He was just discharged from the hospital on June 8 after being admitted on May 26.  Patient was diagnosed with congestive heart failure, coronary artery disease and diminished ejection fraction amongst several other medical issues.  According to family members patient suddenly developed acute chest pain while he was home today.  He then became unresponsive.  Family proceeded with CPR.  EMS was called.  They proceeded with CPR and administered 10 epi's, 50 mEq of bicarb and 450 mg of amiodarone.  Patient was shocked 4 times for V. fib.  There were intermittent episodes of ROSC.  Patient had been undergoing CPR for at least an hour.  EMS eventually transported down to the ED but he did not have any more return of spontaneous circulation during the transport to the ED. on arrival the patient was in a PEA rhythm  Past Medical History:  Diagnosis Date   Asthma    CHF (congestive heart failure) ([place])    Diabetes mellitus without complication ([place])    OSA on CPAP    Pneumonia 06/2016    Patient Active Problem List   Diagnosis Date Noted   Hypoxia    Mediastinal adenopathy    Abnormal serum ACE level    Pulmonary infiltrate present on computed tomography    CHF (congestive heart failure) ([place]) 11/15/2020   Acute on chronic systolic CHF (congestive heart failure) ([place]) 11/15/2020   Acute respiratory failure with hypoxia ([place]) 11/15/2020   Tobacco abuse 11/15/2020   Troponin level elevated 11/15/2020   Uncontrolled type 2 diabetes mellitus with hyperglycemia ([place]) 11/26/2018   Venous stasis dermatitis of both lower extremities 11/26/2018   Morbid obesity  with BMI of 45.0-49.9, adult ([place]) 11/26/2018   Mild intermittent asthma without complication 33/00/7622   Cellulitis 11/09/2018   Lymphadenopathy 11/07/2018   Dehydration 11/06/2018   Hyperglycemia 11/06/2018   Sepsis ([place]) 11/06/2018   CAP (community acquired pneumonia) 63/33/5456   Chronic systolic heart failure ([place]) 07/23/2016   HTN (hypertension) 07/23/2016   OSA (obstructive sleep apnea) 07/23/2016   Tobacco use 07/23/2016   Diabetes ([place]) 07/23/2016    Past Surgical History:  Procedure Laterality Date   BRONCHIAL BIOPSY  11/21/2020   Procedure: BRONCHIAL BIOPSIES;  Surgeon: Candee Furbish, MD;  Location: [facility] ENDOSCOPY;  Service: Pulmonary;;   BRONCHIAL NEEDLE ASPIRATION BIOPSY  11/21/2020   Procedure: BRONCHIAL NEEDLE ASPIRATION BIOPSIES;  Surgeon: Candee Furbish, MD;  Location: [facility] ENDOSCOPY;  Service: Pulmonary;;   I & D EXTREMITY     RIGHT/LEFT HEART CATH AND CORONARY ANGIOGRAPHY N/A 11/20/2020   Procedure: RIGHT/LEFT HEART CATH AND CORONARY ANGIOGRAPHY;  Surgeon: Larey Dresser, MD;  Location: [place] CV LAB;  Service: Cardiovascular;  Laterality: N/A;   SKIN GRAFT     VIDEO BRONCHOSCOPY WITH ENDOBRONCHIAL ULTRASOUND N/A 11/21/2020   Procedure: VIDEO BRONCHOSCOPY WITH ENDOBRONCHIAL ULTRASOUND;  Surgeon: Candee Furbish, MD;  Location: [facility] ENDOSCOPY;  Service: Pulmonary;  Laterality: N/A;       Family History  Problem Relation Age of Onset   Congestive Heart Failure Mother    Hypertension Mother  Stroke Father        2017   Asthma Father     Social History   Tobacco Use   Smoking status: Every Day    Packs/day: 0.75    Pack years: 0.00    Types: Cigarettes    Start date: 11/23/2016   Smokeless tobacco: Never  Vaping Use   Vaping Use: Former   Start date: 11/23/2016  Substance Use Topics   Alcohol use: Yes    Comment: occasional    Drug use: No    Home Medications Prior to Admission medications   Medication Sig Start Date End Date Taking? Authorizing  Provider  aspirin 81 MG EC tablet Take 1 tablet (81 mg total) by mouth daily. Swallow whole. 11/29/20   Nita Sells, MD  Blood Glucose Monitoring Suppl (TRUE METRIX METER) w/Device KIT use up to four times daily as directed 11/28/20   Nita Sells, MD  carvedilol (COREG) 3.125 MG tablet Take 1 tablet (3.125 mg total) by mouth 2 (two) times daily with a meal. 11/28/20   Nita Sells, MD  digoxin (LANOXIN) 0.125 MG tablet Take 1 tablet (0.125 mg total) by mouth daily. 11/29/20   Nita Sells, MD  fluticasone (FLONASE) 50 MCG/ACT nasal spray Place 2 sprays into both nostrils daily. 11/29/20   Nita Sells, MD  furosemide (LASIX) 40 MG tablet Take 1 tablet (40 mg total) by mouth daily. 11/29/20   Nita Sells, MD  glucose blood (TRUE METRIX BLOOD GLUCOSE TEST) test strip use up to 4 times daily as directed 11/28/20   Nita Sells, MD  HYDROcodone-acetaminophen (NORCO/VICODIN) 5-325 MG tablet Take 2 tablets by mouth every 6 (six) hours as needed. Patient not taking: No sig reported 06/22/20   Ward, Cyril Mourning N, DO  insulin aspart protamine - aspart (NOVOLOG 70/30 MIX) (70-30) 100 UNIT/ML FlexPen Inject 0.45 mLs (45 Units total) into the skin 2 (two) times daily with a meal. 11/28/20   Nita Sells, MD  ipratropium-albuterol (DUONEB) 0.5-2.5 (3) MG/3ML SOLN Take 3 mLs by nebulization every 4 (four) hours as needed. 11/28/20   Nita Sells, MD  metFORMIN (GLUCOPHAGE) 500 MG tablet Take 1 tablet (500 mg total) by mouth 2 (two) times daily with a meal. Patient not taking: No sig reported 03/21/19   Darylene Price A, FNP  metFORMIN (GLUCOPHAGE) 500 MG tablet Take 1 tablet by mouth in the morning and 1 tablet in the evening. 11/30/20   Nita Sells, MD  methotrexate (RHEUMATREX) 2.5 MG tablet Take 8 tablets (20 mg total) by mouth once a week. 11/30/20   Nita Sells, MD  nitroGLYCERIN (NITROSTAT) 0.4 MG SL tablet Place 1 tablet (0.4 mg total)  under the tongue every 5 (five) minutes as needed for chest pain. 11/28/20   Nita Sells, MD  predniSONE (DELTASONE) 20 MG tablet Take 3 tablets (60 mg total) by mouth daily with breakfast. 11/29/20   Nita Sells, MD  rosuvastatin (CRESTOR) 20 MG tablet Take 1 tablet (20 mg total) by mouth daily. 11/29/20   Nita Sells, MD  sacubitril-valsartan (ENTRESTO) 24-26 MG Take 1 tablet by mouth 2 (two) times daily. 11/28/20   Nita Sells, MD  sodium chloride (OCEAN) 0.65 % SOLN nasal spray Place 1 spray into both nostrils as needed for congestion. 11/28/20   Nita Sells, MD  spironolactone (ALDACTONE) 25 MG tablet Take 1 tablet (25 mg total) by mouth daily. 11/29/20   Nita Sells, MD  sulfamethoxazole-trimethoprim (BACTRIM DS) 800-160 MG tablet Take 1 tablet by mouth 3 (three) times  a week. 11/28/20   Nita Sells, MD  TRUEplus Lancets 28G MISC use up to 4 times daily as directed 11/28/20   Nita Sells, MD    Allergies    Azithromycin  Review of Systems   Review of Systems  Unable to perform ROS: Acuity of condition   Physical Exam Updated Vital Signs BP (!) 38/14   Pulse 90   Resp 14   Ht 1.905 m (6' 3")   Wt (!) 162 kg   BMI 44.64 kg/m   Physical Exam Constitutional:      Appearance: He is ill-appearing.  HENT:     Head: Normocephalic and atraumatic.     Nose: Nose normal.     Mouth/Throat:     Comments: Lips appeared cyanotic Eyes:     Comments: Pupils are nonreactive  Cardiovascular:     Comments: Absent heart sounds, bradycardic rhythm Pulmonary:     Comments: Equal breath sounds with King airway assisted respirations Abdominal:     Tenderness: There is no abdominal tenderness.  Musculoskeletal:        General: No deformity or signs of injury.     Cervical back: No rigidity.     Right lower leg: Edema present.     Left lower leg: Edema present.  Skin:    Coloration: Skin is pale.  Neurological:     GCS: GCS eye  subscore is 1. GCS verbal subscore is 1. GCS motor subscore is 1.    ED Results / Procedures / Treatments   Labs (all labs ordered are listed, but only abnormal results are displayed) Labs Reviewed  BLOOD GAS, ARTERIAL  BASIC METABOLIC PANEL  CBC  APTT  PROTIME-INR  LACTIC ACID, PLASMA  CBG MONITORING, ED  TROPONIN I (HIGH SENSITIVITY)  TROPONIN I (HIGH SENSITIVITY)    EKG EKG Interpretation  Date/Time:  12/26/2020 20:38:48 EDT Ventricular Rate:  60 PR Interval:  421 QRS Duration: 146 QT Interval:  422 QTC Calculation: 422 R Axis:   111 Text Interpretation: Sinus rhythm Prolonged PR interval Prominent P waves, nondiagnostic Nonspecific intraventricular conduction delay Since last tracing rate slower Confirmed by Dorie Rank [phone]) on 2020-12-26 8:49:04 PM  Radiology No results found.  Procedures Procedures   Medications Ordered in ED Medications  aspirin suppository 300 mg (has no administration in time range)  0.9 %  sodium chloride infusion (has no administration in time range)  norepinephrine (LEVOPHED) 29m in 2520mpremix infusion (has no administration in time range)  EPINEPHrine (ADRENALIN) 1 MG/10ML injection (1 mg Intravenous Given 11/2020/07/06034)    ED Course  I have reviewed the triage vital signs and the nursing notes.  Pertinent labs & imaging results that were available during my care of the patient were reviewed by me and considered in my medical decision making (see chart for details).  Clinical Course as of [id number]-Jul-[id number]  Tue JuJul 06, 2022irefly had a pulse.  Once cpr stopped pt became more bradycardic again.  Lost pulse.   Agonal rhythm, pea rate 30s.  Code called 2042 [JK]    Clinical Course User Index [JK] KnDorie RankMD   MDM Rules/Calculators/A&P                          Patient underwent prolonged CPR.  When he first arrived in the ED we did have brief return of spontaneous circulation.  Patient had been given several  doses of epinephrine.  Levophed drip was ordered.  Patient however quickly decompensated and became more bradycardic.  Unable to appreciate a pulse.  PEA rhythm with a rate in 30s patient has been undergoing CPR for a total of at least 90 minutes.  Patient's chances of neurologic recovery was extremely limited.  CPR was discontinued and patient was declared dead at Aug 01, 2040.   Final Clinical Impression(s) / ED Diagnoses Final diagnoses:  Cardiac arrest [facility])     Dorie Rank, MD 19-Dec-2020 2331/08/02

## 2020-12-21 NOTE — ED Triage Notes (Signed)
Pt BIB [facility] from home, witnessed arrest by family who started CPR, EMS arrived and continued CPR @ 1904. Pt with intermittent ROSC, given total 10 epis, bicarb, 450mg  amio by EMS, shocked x 4 for vfib. Pt CPR in progress on arrival.

## 2020-12-21 DEATH — deceased

## 2020-12-27 ENCOUNTER — Inpatient Hospital Stay: Payer: Self-pay | Admitting: Internal Medicine

## 2021-01-09 ENCOUNTER — Other Ambulatory Visit: Payer: Self-pay

## 2021-02-15 ENCOUNTER — Other Ambulatory Visit (HOSPITAL_COMMUNITY): Payer: Self-pay

## 2021-03-08 ENCOUNTER — Ambulatory Visit: Payer: Self-pay | Admitting: Internal Medicine
